# Patient Record
Sex: Female | Born: 1940
Health system: Southern US, Community
[De-identification: ages and names within clinical notes are randomized; demographics above are authoritative.]

## PROBLEM LIST (undated history)

## (undated) DIAGNOSIS — G4700 Insomnia, unspecified: Secondary | ICD-10-CM

## (undated) DIAGNOSIS — N183 Chronic kidney disease, stage 3 unspecified: Secondary | ICD-10-CM

## (undated) DIAGNOSIS — M1990 Unspecified osteoarthritis, unspecified site: Secondary | ICD-10-CM

## (undated) DIAGNOSIS — I110 Hypertensive heart disease with heart failure: Secondary | ICD-10-CM

## (undated) DIAGNOSIS — I6529 Occlusion and stenosis of unspecified carotid artery: Secondary | ICD-10-CM

## (undated) DIAGNOSIS — I071 Rheumatic tricuspid insufficiency: Secondary | ICD-10-CM

## (undated) DIAGNOSIS — Z0389 Encounter for observation for other suspected diseases and conditions ruled out: Secondary | ICD-10-CM

## (undated) DIAGNOSIS — M62830 Muscle spasm of back: Secondary | ICD-10-CM

## (undated) DIAGNOSIS — H9190 Unspecified hearing loss, unspecified ear: Secondary | ICD-10-CM

## (undated) DIAGNOSIS — R350 Frequency of micturition: Secondary | ICD-10-CM

## (undated) DIAGNOSIS — R519 Headache, unspecified: Secondary | ICD-10-CM

## (undated) DIAGNOSIS — H18519 Endothelial corneal dystrophy, unspecified eye: Secondary | ICD-10-CM

## (undated) DIAGNOSIS — R609 Edema, unspecified: Secondary | ICD-10-CM

## (undated) DIAGNOSIS — I5032 Chronic diastolic (congestive) heart failure: Principal | ICD-10-CM

## (undated) DIAGNOSIS — E119 Type 2 diabetes mellitus without complications: Secondary | ICD-10-CM

## (undated) DIAGNOSIS — E7800 Pure hypercholesterolemia, unspecified: Secondary | ICD-10-CM

## (undated) DIAGNOSIS — Z9114 Patient's other noncompliance with medication regimen: Secondary | ICD-10-CM

## (undated) DIAGNOSIS — R001 Bradycardia, unspecified: Secondary | ICD-10-CM

## (undated) DIAGNOSIS — K219 Gastro-esophageal reflux disease without esophagitis: Secondary | ICD-10-CM

## (undated) DIAGNOSIS — G4733 Obstructive sleep apnea (adult) (pediatric): Secondary | ICD-10-CM

## (undated) DIAGNOSIS — F419 Anxiety disorder, unspecified: Secondary | ICD-10-CM

## (undated) DIAGNOSIS — H1851 Endothelial corneal dystrophy: Secondary | ICD-10-CM

## (undated) DIAGNOSIS — Z9989 Dependence on other enabling machines and devices: Secondary | ICD-10-CM

## (undated) DIAGNOSIS — R51 Headache: Secondary | ICD-10-CM

## (undated) DIAGNOSIS — Z91148 Patient's other noncompliance with medication regimen for other reason: Secondary | ICD-10-CM

## (undated) DIAGNOSIS — I340 Nonrheumatic mitral (valve) insufficiency: Secondary | ICD-10-CM

## (undated) DIAGNOSIS — Z9111 Patient's noncompliance with dietary regimen: Secondary | ICD-10-CM

## (undated) DIAGNOSIS — F329 Major depressive disorder, single episode, unspecified: Secondary | ICD-10-CM

## (undated) DIAGNOSIS — F32A Depression, unspecified: Secondary | ICD-10-CM

## (undated) DIAGNOSIS — R600 Localized edema: Secondary | ICD-10-CM

## (undated) HISTORY — PX: ABDOMINAL HYSTERECTOMY: SHX81

## (undated) HISTORY — DX: Nonrheumatic mitral (valve) insufficiency: I34.0

## (undated) HISTORY — DX: Occlusion and stenosis of unspecified carotid artery: I65.29

## (undated) HISTORY — PX: EYE SURGERY: SHX253

## (undated) HISTORY — PX: COLONOSCOPY: SHX174

## (undated) HISTORY — PX: ROTATOR CUFF REPAIR: SHX139

## (undated) HISTORY — DX: Patient's other noncompliance with medication regimen for other reason: Z91.148

## (undated) HISTORY — PX: APPENDECTOMY: SHX54

## (undated) HISTORY — DX: Chronic diastolic (congestive) heart failure: I50.32

## (undated) HISTORY — PX: CHOLECYSTECTOMY: SHX55

## (undated) HISTORY — DX: Patient's noncompliance with dietary regimen: Z91.11

## (undated) HISTORY — DX: Edema, unspecified: R60.9

## (undated) HISTORY — DX: Rheumatic tricuspid insufficiency: I07.1

## (undated) HISTORY — DX: Bradycardia, unspecified: R00.1

## (undated) HISTORY — DX: Localized edema: R60.0

## (undated) HISTORY — DX: Chronic kidney disease, stage 3 unspecified: N18.30

## (undated) HISTORY — PX: CARDIAC CATHETERIZATION: SHX172

## (undated) HISTORY — DX: Encounter for observation for other suspected diseases and conditions ruled out: Z03.89

## (undated) HISTORY — DX: Hypertensive heart disease with heart failure: I11.0

## (undated) HISTORY — DX: Patient's other noncompliance with medication regimen: Z91.14

---

## 1997-12-23 ENCOUNTER — Ambulatory Visit: Admission: RE | Admit: 1997-12-23 | Discharge: 1997-12-23 | Payer: Self-pay | Admitting: Family Medicine

## 1998-03-31 ENCOUNTER — Ambulatory Visit: Admission: RE | Admit: 1998-03-31 | Discharge: 1998-03-31 | Payer: Self-pay | Admitting: Internal Medicine

## 1998-12-05 ENCOUNTER — Ambulatory Visit (HOSPITAL_BASED_OUTPATIENT_CLINIC_OR_DEPARTMENT_OTHER): Admission: RE | Admit: 1998-12-05 | Discharge: 1998-12-05 | Payer: Self-pay | Admitting: Orthopedic Surgery

## 1999-09-12 ENCOUNTER — Encounter: Payer: Self-pay | Admitting: Family Medicine

## 1999-09-12 ENCOUNTER — Encounter: Admission: RE | Admit: 1999-09-12 | Discharge: 1999-09-12 | Payer: Self-pay | Admitting: Family Medicine

## 1999-09-18 ENCOUNTER — Encounter: Payer: Self-pay | Admitting: Emergency Medicine

## 1999-09-18 ENCOUNTER — Encounter: Admission: RE | Admit: 1999-09-18 | Discharge: 1999-09-18 | Payer: Self-pay | Admitting: *Deleted

## 2000-09-29 ENCOUNTER — Encounter: Payer: Self-pay | Admitting: Family Medicine

## 2000-09-29 ENCOUNTER — Encounter: Admission: RE | Admit: 2000-09-29 | Discharge: 2000-09-29 | Payer: Self-pay | Admitting: Family Medicine

## 2001-10-13 ENCOUNTER — Encounter: Payer: Self-pay | Admitting: *Deleted

## 2001-10-13 ENCOUNTER — Encounter: Admission: RE | Admit: 2001-10-13 | Discharge: 2001-10-13 | Payer: Self-pay | Admitting: *Deleted

## 2001-12-02 ENCOUNTER — Inpatient Hospital Stay (HOSPITAL_COMMUNITY): Admission: EM | Admit: 2001-12-02 | Discharge: 2001-12-03 | Payer: Self-pay | Admitting: Emergency Medicine

## 2002-10-17 ENCOUNTER — Encounter: Payer: Self-pay | Admitting: Family Medicine

## 2002-10-17 ENCOUNTER — Encounter: Admission: RE | Admit: 2002-10-17 | Discharge: 2002-10-17 | Payer: Self-pay | Admitting: Family Medicine

## 2003-10-18 ENCOUNTER — Encounter: Admission: RE | Admit: 2003-10-18 | Discharge: 2003-10-18 | Payer: Self-pay | Admitting: Family Medicine

## 2003-11-07 ENCOUNTER — Other Ambulatory Visit: Admission: RE | Admit: 2003-11-07 | Discharge: 2003-11-07 | Payer: Self-pay | Admitting: Family Medicine

## 2004-11-06 ENCOUNTER — Encounter: Admission: RE | Admit: 2004-11-06 | Discharge: 2004-11-06 | Payer: Self-pay | Admitting: Family Medicine

## 2005-11-19 ENCOUNTER — Encounter: Admission: RE | Admit: 2005-11-19 | Discharge: 2005-11-19 | Payer: Self-pay | Admitting: Family Medicine

## 2005-12-09 ENCOUNTER — Other Ambulatory Visit: Admission: RE | Admit: 2005-12-09 | Discharge: 2005-12-09 | Payer: Self-pay | Admitting: Obstetrics and Gynecology

## 2006-05-28 ENCOUNTER — Other Ambulatory Visit: Admission: RE | Admit: 2006-05-28 | Discharge: 2006-05-28 | Payer: Self-pay | Admitting: Obstetrics and Gynecology

## 2006-06-01 ENCOUNTER — Encounter: Admission: RE | Admit: 2006-06-01 | Discharge: 2006-06-01 | Payer: Self-pay | Admitting: Obstetrics and Gynecology

## 2006-11-17 ENCOUNTER — Ambulatory Visit (HOSPITAL_BASED_OUTPATIENT_CLINIC_OR_DEPARTMENT_OTHER): Admission: RE | Admit: 2006-11-17 | Discharge: 2006-11-17 | Payer: Self-pay | Admitting: Orthopedic Surgery

## 2006-11-23 ENCOUNTER — Encounter: Admission: RE | Admit: 2006-11-23 | Discharge: 2006-11-23 | Payer: Self-pay | Admitting: Family Medicine

## 2007-08-13 ENCOUNTER — Ambulatory Visit (HOSPITAL_BASED_OUTPATIENT_CLINIC_OR_DEPARTMENT_OTHER): Admission: RE | Admit: 2007-08-13 | Discharge: 2007-08-13 | Payer: Self-pay | Admitting: Orthopedic Surgery

## 2007-11-03 ENCOUNTER — Encounter: Admission: RE | Admit: 2007-11-03 | Discharge: 2007-11-03 | Payer: Self-pay | Admitting: Family Medicine

## 2007-11-25 ENCOUNTER — Encounter: Admission: RE | Admit: 2007-11-25 | Discharge: 2007-11-25 | Payer: Self-pay | Admitting: Family Medicine

## 2008-05-12 DIAGNOSIS — IMO0001 Reserved for inherently not codable concepts without codable children: Secondary | ICD-10-CM

## 2008-05-12 HISTORY — DX: Reserved for inherently not codable concepts without codable children: IMO0001

## 2008-08-14 ENCOUNTER — Inpatient Hospital Stay (HOSPITAL_BASED_OUTPATIENT_CLINIC_OR_DEPARTMENT_OTHER): Admission: RE | Admit: 2008-08-14 | Discharge: 2008-08-14 | Payer: Self-pay | Admitting: Interventional Cardiology

## 2008-12-08 ENCOUNTER — Encounter: Admission: RE | Admit: 2008-12-08 | Discharge: 2008-12-08 | Payer: Self-pay | Admitting: Family Medicine

## 2010-01-07 ENCOUNTER — Encounter: Admission: RE | Admit: 2010-01-07 | Discharge: 2010-01-07 | Payer: Self-pay | Admitting: Family Medicine

## 2010-07-26 ENCOUNTER — Emergency Department (HOSPITAL_COMMUNITY): Payer: Medicare Other

## 2010-07-26 ENCOUNTER — Emergency Department (HOSPITAL_COMMUNITY)
Admission: EM | Admit: 2010-07-26 | Discharge: 2010-07-26 | Disposition: A | Discharge status: Home / Self Care | Payer: Medicare Other | Attending: Emergency Medicine | Admitting: Emergency Medicine

## 2010-07-26 DIAGNOSIS — R0602 Shortness of breath: Secondary | ICD-10-CM | POA: Insufficient documentation

## 2010-07-26 DIAGNOSIS — F411 Generalized anxiety disorder: Secondary | ICD-10-CM | POA: Insufficient documentation

## 2010-07-26 DIAGNOSIS — I10 Essential (primary) hypertension: Secondary | ICD-10-CM | POA: Insufficient documentation

## 2010-07-26 DIAGNOSIS — E7800 Pure hypercholesterolemia, unspecified: Secondary | ICD-10-CM | POA: Insufficient documentation

## 2010-07-26 DIAGNOSIS — R0682 Tachypnea, not elsewhere classified: Secondary | ICD-10-CM | POA: Insufficient documentation

## 2010-07-26 DIAGNOSIS — R0789 Other chest pain: Secondary | ICD-10-CM | POA: Insufficient documentation

## 2010-07-26 DIAGNOSIS — R259 Unspecified abnormal involuntary movements: Secondary | ICD-10-CM | POA: Insufficient documentation

## 2010-07-26 DIAGNOSIS — Z79899 Other long term (current) drug therapy: Secondary | ICD-10-CM | POA: Insufficient documentation

## 2010-07-26 LAB — BASIC METABOLIC PANEL
BUN: 25 mg/dL — ABNORMAL HIGH (ref 6–23)
GFR calc non Af Amer: 54 mL/min — ABNORMAL LOW (ref >60)

## 2010-07-26 LAB — DIFFERENTIAL
Basophils Absolute: 0 10*3/uL (ref 0.0–0.1)
Basophils Relative: 0 % (ref 0–1)
Eosinophils Absolute: 0.1 10*3/uL (ref 0.0–0.7)
Eosinophils Relative: 1 % (ref 0–5)
Monocytes Absolute: 0.4 10*3/uL (ref 0.1–1.0)
Monocytes Relative: 6 % (ref 3–12)
Neutro Abs: 3 10*3/uL (ref 1.7–7.7)

## 2010-07-26 LAB — CBC
HCT: 38 % (ref 36.0–46.0)
Hemoglobin: 12.7 g/dL (ref 12.0–15.0)
MCHC: 33.4 g/dL (ref 30.0–36.0)
MCV: 88.6 fL (ref 78.0–100.0)
RBC: 4.29 MIL/uL (ref 3.87–5.11)
RDW: 12.8 % (ref 11.5–15.5)
WBC: 6.4 10*3/uL (ref 4.0–10.5)

## 2010-07-26 LAB — POCT CARDIAC MARKERS
CKMB, poc: <1 ng/mL — ABNORMAL LOW (ref 1.0–8.0)
Troponin i, poc: <0.05 ng/mL (ref 0.00–0.09)

## 2010-09-24 NOTE — Cardiovascular Report (Signed)
NAMEABBE, BULA NO.:  [id number]   MEDICAL RECORD NO.:  [id number]          PATIENT TYPE:  OIB   LOCATION:  1961                         FACILITY:  [facility]   PHYSICIAN:  Lyn Records, M.D.   DATE OF BIRTH:  [birth year]   DATE OF PROCEDURE:  08/14/2008  DATE OF DISCHARGE:  08/14/2008                            CARDIAC CATHETERIZATION   INDICATION FOR THIS STUDY:  Chest tightness and pressure.   PROCEDURE PERFORMED:  1. Left heart cath.  2. Selective coronary angio.  3. Left ventriculography.   DESCRIPTION:  After informed consent, a 4-French sheath was placed in  the right femoral artery using the modified Seldinger technique.  A 4-  [place] A2 multipurpose catheter was used for hemodynamic recordings,  left ventriculography by hand injection, and attempted right coronary  angiography.  A 4-French #4 left Judkins catheters were also used for  left and right coronary angiography respectively.  The patient tolerated  the procedure without complications.   RESULTS:  1. Hemodynamic data:      a.     Aortic pressure 202/86.      b.     Left ventricular pressure 210/24.  2. Left ventriculography:  The left ventricle is normal in size.  LV      systolic function is normal.  Ejection fraction is 80%.  3. Coronary angiography.      a.     Left main coronary:  Left main coronary artery is patent.       It is relatively short.  No significant obstruction is noted.      b.     Left anterior descending coronary:  LAD is large and is       transapical.  The LAD gives origin to at least one relatively       large diagonal branch.  No significant obstruction is noted.      c.     Circumflex artery:  The circumflex coronary artery contains       luminal irregularities involving the first and second obtuse       marginal via the origin from the circumflex.  No significant       circumflex obstructions are noted.  The circumflex was dominant.       It gives origin to  distal tiny PDA branch as a continuation of the       circumflex into the AV groove and also a small posterolateral       branch.      d.     Right coronary:  The right coronary is nondominant and is       free of any significant obstruction.   CONCLUSION:  1. Significant hypertension with diastolic dysfunction and left      ventricular end-diastolic pressure greater than 20.  2. Normal coronary arteries.  There is a dominant circumflex and      nondominant right coronary.  No obstructive disease is noted.  3. Chest pressure is now on the basis of abnormal epicardial      coronaries.  It could be related to  microvascular disease and/or      diastolic heart failure.   PLAN:  1. Aggressive blood pressure control.  2. Risk factor modification.  3. Consider a myocardial perfusion study to look for abnormal      perfusion which would documented the diagnosis of microvascular      disease.  4. No mechanical intervention is needed in the absence of epicardial      coronary disease.      Lyn Records, M.D.  Electronically Signed     HWS/MEDQ  D:  08/14/2008  T:  08/15/2008  Job:  [id number]   cc:   [name], M.D.

## 2010-09-24 NOTE — Op Note (Signed)
NAME:  Kimberly [name], Kimberly [name]                ACCOUNT NO.:  [id number]   MEDICAL RECORD NO.:  [id number]          PATIENT TYPE:  AMB   LOCATION:  DSC                          FACILITY:  [facility]   PHYSICIAN:  Katy Fitch. Sypher, M.D. DATE OF BIRTH:  [birth year]   DATE OF PROCEDURE:  11/17/2006  DATE OF DISCHARGE:                               OPERATIVE REPORT   PREOPERATIVE DIAGNOSIS:  Severe right carpal tunnel syndrome.   POSTOPERATIVE DIAGNOSIS:  Severe right carpal tunnel syndrome with  identification of remarkable fatty infiltration of ulnar bursa.   OPERATIONS:  Release of right transverse carpal ligament.   OPERATIONS:  [name], M.D.   ASSISTANT:  [name] PA-C.   ANESTHESIA:  Is IV regional with general sedation, supervising  anesthesiologist is Dr. Sampson Goon.   INDICATION:  The patient is a 70 year old right-hand dominant woman  referred through the courtesy of [name], M.D. for evaluation  and management of bilateral hand numbness and discomfort.  Clinical  examination revealed an obese woman who is 5 feet tall and weighs 176  pounds.  Clinical examination revealed thenar atrophy and loss of  sensibility in the median distribution right greater than left.  Electrodiagnostic studies confirmed profound right carpal tunnel  syndrome and moderately severe left carpal tunnel syndrome.   Due to failed response to nonoperative measures, she is brought to the  operating room at this time for release of her right transverse carpal  ligament.   Preoperatively she was evaluated and noted to have hypertension and  history of other medical predicaments leading to a choice for IV  regional anesthesia.   Her systolic blood pressure at the initiation of anesthesia was in  excess of 200 mmHg.  Therefore a pneumatic tourniquet was applied to the  proximal brachium during placement of the IV regional block at 320 mmHg.   An IV regional block was placed in standard technique  followed by  routine Betadine scrub of the right upper extremity.   After 13 minutes excellent anesthesia of the hand was achieved.   Procedure commenced with a short incision in line of the ring finger  palm.  The subcutaneous tissues were carefully divided revealing palmar  fascia.  The fiber the palmar fascia split longitudinally revealing a  very substantial deposit of adipose tissue in the mid palmar space.  This fluid extruded from the wound.   This was retracted with Senn retractors and ultimately a mid size  Weitlaner was utilized to visualize the common sensory branch of the  median nerve.  The volar forearm fascia was released subcutaneously into  the distal forearm followed by identification of the distal margin of  transverse carpal ligament.  Once median nerves carefully protected the  transverse carpal ligament was released subcutaneously extending into  the distal forearm.   This revealed the contents of the ulnar bursa.  There was remarkable  degree of fatty infiltration of the ulnar bursa and a small lipoma  appeared to be within the wall of the median nerve.  This was left  intact due to concern that injury to the  fascicles of the nerve could be  a predicament with debridement of the lipoma.  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   Bleeding points along the margin of loose ligament were cauterized  bipolar current followed by repair of the skin with intradermal 3-0  Prolene suture.   A compressive dressing was applied with a volar plaster splint  maintaining the wrist in 5 degrees dorsiflexion.   No apparent complications.      Katy Fitch Sypher, M.D.  Electronically Signed     RVS/MEDQ  D:  11/17/2006  T:  11/17/2006  Job:  [id number]   cc:   [name], M.D.

## 2010-09-24 NOTE — Op Note (Signed)
NAME:  Kimberly [name], Kimberly [name]                ACCOUNT NO.:  [id number]   MEDICAL RECORD NO.:  [id number]          PATIENT TYPE:  AMB   LOCATION:  DSC                          FACILITY:  [facility]   PHYSICIAN:  Katy Fitch. Sypher, M.D. DATE OF BIRTH:  May 14, 1940   DATE OF PROCEDURE:  08/13/2007  DATE OF DISCHARGE:                               OPERATIVE REPORT   PREOPERATIVE DIAGNOSIS:  Entrapment neuropathy median nerve left carpal  tunnel with significantly abnormal electrodiagnostic studies.   POSTOPERATIVE DIAGNOSIS:  Entrapment neuropathy median nerve left carpal  tunnel with significantly abnormal electrodiagnostic studies.   OPERATION:  Release of left transverse carpal ligament.   OPERATIONS:  Katy Fitch. Sypher, M.D.   ASSISTANT:  Jonni Sanger, P.A.   ANESTHESIA:  General by LMA.   SUPERVISING ANESTHESIOLOGIST:  Quita Skye. Krista Blue, M.D.   INDICATIONS:  [name] is a 70 year old woman referred through the  courtesy of Dr. Illa Level of [facility] for evaluation  and management of hand pain and hand numbness.  Electrodiagnostic  studies performed in the summer of 2008 confirmed severe bilateral  carpal tunnel syndrome.   She is status post release of the right trans-carpal ligament with a  good long term outcome.  She now returns for identical surgery on the  left.   Preoperatively she was reminded of the potential risks and benefits of  surgery.  She has multiple drug intolerances.  She is able to safely  take Darvocet as an analgesic.   We advised her to proceed with release of her left transverse carpal  ligament at this time.  Questions were invited and answered in detail.   PROCEDURE IN DETAIL:  [name] is brought to the operating room  and placed in supine position upon the operating table.   Following the induction of general anesthesia by LMA technique the left  arm was prepped with Betadine soap solution, sterilely draped.   A pneumatic  tourniquet was applied proximal left brachium.  Following  exsanguination of left arm with Esmarch bandage the arterial tourniquet  was inflated to 220 mmHg.  The procedure commenced with a short incision  paralleling the radial border of the ring finger.  Subcutaneous tissues  were carefully divided taking care to identify the palmar fascia.  This  was released subcutaneously revealing the common sensory branch of the  median nerve and the transverse carpal ligament.  A [place] 4 elevator  was used to separate the median nerve from the transverse carpal  ligament.  A pair of tenotomy scissors was used to release the distal  margin of the transverse carpal ligament followed by use of iridectomy  scissors to release the transverse carpal ligament subcutaneously into  the distal forearm as well as the volar forearm fascia.  This widely  opened the carpal canal.  Ms. Frysinger's median nerve was quite edematous  and hyperemic.  The wound was inspected for bleeding points followed by  repair of the skin with intradermal 3-0 Prolene.   A compressive dressing was applied with a volar plaster splint  maintaining the wrist  in 5 degrees of dorsiflexion.  For aftercare Ms.  Chiquito is provided a prescription for Darvocet-N 100 one p.o. q.4-6 h.  p.r.n. pain 20 tablets without refill.  We will see her back for  followup in our office for reexamination in 1 week.      Katy Fitch Sypher, M.D.  Electronically Signed     RVS/MEDQ  D:  08/13/2007  T:  08/13/2007  Job:  [id number]   cc:   [name], M.D.

## 2011-01-02 ENCOUNTER — Other Ambulatory Visit: Payer: Self-pay | Admitting: Family Medicine

## 2011-01-02 DIAGNOSIS — Z1231 Encounter for screening mammogram for malignant neoplasm of breast: Secondary | ICD-10-CM

## 2011-01-21 ENCOUNTER — Ambulatory Visit
Admission: RE | Admit: 2011-01-21 | Discharge: 2011-01-21 | Disposition: A | Discharge status: Home / Self Care | Payer: Medicare Other | Source: Ambulatory Visit | Attending: Family Medicine | Admitting: Family Medicine

## 2011-01-21 DIAGNOSIS — Z1231 Encounter for screening mammogram for malignant neoplasm of breast: Secondary | ICD-10-CM

## 2011-02-04 LAB — BASIC METABOLIC PANEL
BUN: 21
Calcium: 10.1
Creatinine, Ser: 0.82
Potassium: 3.1 — ABNORMAL LOW

## 2011-02-25 LAB — BASIC METABOLIC PANEL
BUN: 16
CO2: 29
Calcium: 10.4
Chloride: 101
Creatinine, Ser: 0.69
GFR calc Af Amer: >60
GFR calc non Af Amer: >60
Glucose, Bld: 103 — ABNORMAL HIGH
Sodium: 138

## 2011-10-14 ENCOUNTER — Other Ambulatory Visit: Payer: Self-pay | Admitting: Orthopedic Surgery

## 2011-10-14 DIAGNOSIS — M533 Sacrococcygeal disorders, not elsewhere classified: Secondary | ICD-10-CM

## 2011-10-17 ENCOUNTER — Encounter (HOSPITAL_COMMUNITY): Payer: Self-pay | Admitting: Emergency Medicine

## 2011-10-17 ENCOUNTER — Emergency Department (HOSPITAL_COMMUNITY)
Admission: EM | Admit: 2011-10-17 | Discharge: 2011-10-17 | Disposition: A | Discharge status: Home / Self Care | Payer: Medicare Other | Attending: Emergency Medicine | Admitting: Emergency Medicine

## 2011-10-17 ENCOUNTER — Emergency Department (HOSPITAL_COMMUNITY): Payer: Medicare Other

## 2011-10-17 DIAGNOSIS — R0609 Other forms of dyspnea: Secondary | ICD-10-CM | POA: Insufficient documentation

## 2011-10-17 DIAGNOSIS — R0600 Dyspnea, unspecified: Secondary | ICD-10-CM

## 2011-10-17 DIAGNOSIS — I10 Essential (primary) hypertension: Secondary | ICD-10-CM | POA: Insufficient documentation

## 2011-10-17 DIAGNOSIS — K219 Gastro-esophageal reflux disease without esophagitis: Secondary | ICD-10-CM | POA: Insufficient documentation

## 2011-10-17 DIAGNOSIS — R0602 Shortness of breath: Secondary | ICD-10-CM | POA: Insufficient documentation

## 2011-10-17 DIAGNOSIS — R0989 Other specified symptoms and signs involving the circulatory and respiratory systems: Secondary | ICD-10-CM | POA: Insufficient documentation

## 2011-10-17 HISTORY — DX: Gastro-esophageal reflux disease without esophagitis: K21.9

## 2011-10-17 LAB — BASIC METABOLIC PANEL
CO2: 27 mEq/L (ref 19–32)
Calcium: 11.1 mg/dL — ABNORMAL HIGH (ref 8.4–10.5)
Creatinine, Ser: 0.86 mg/dL (ref 0.50–1.10)
GFR calc non Af Amer: 67 mL/min — ABNORMAL LOW (ref >90)
Glucose, Bld: 139 mg/dL — ABNORMAL HIGH (ref 70–99)
Sodium: 140 mEq/L (ref 135–145)

## 2011-10-17 LAB — CBC
MCH: 29.4 pg (ref 26.0–34.0)
MCHC: 33 g/dL (ref 30.0–36.0)
MCV: 89.1 fL (ref 78.0–100.0)
Platelets: 221 10*3/uL (ref 150–400)
RBC: 4.05 MIL/uL (ref 3.87–5.11)

## 2011-10-17 LAB — TROPONIN I: Troponin I: <0.3 ng/mL (ref <0.30)

## 2011-10-17 LAB — D-DIMER, QUANTITATIVE: D-Dimer, Quant: 0.26 ug/mL-FEU (ref 0.00–0.48)

## 2011-10-17 MED ORDER — ONDANSETRON 4 MG PO TBDP
8.0000 mg | ORAL_TABLET | Freq: Once | ORAL | Status: AC
  Administered 2011-10-17: 8 mg via ORAL
  Filled 2011-10-17: qty 2

## 2011-10-17 NOTE — ED Provider Notes (Signed)
History     CSN: [id number]  Arrival date & time 10/17/11  1616   None     Chief Complaint  Patient presents with  . Shortness of Breath    (Consider location/radiation/quality/duration/timing/severity/associated sxs/prior treatment) HPI  Patient is a 71 year old female past medical history of hypertension and acid reflux disease, she also has a recent history of coccygeal pain for which she started taking Percocets approximately 72 hours ago. Her chief complaint today is shortness of breath. She had the acute onset of a sense of being unable to take a full breath. She feels that the air only enters the top of her lungs. She will went outside to take a while to feel better, and she felt like she was short of breath with exertion. She had no chest pain arm pain or jaw pain. She did have tingling in her left distal fingers. She denies any recent illnesses cough or fevers. She denies a history of heart disease lung disease or kidney disease. She denies any nausea vomiting or diarrhea. She denies any diaphoresis. She calls her illness him now mild because it's gotten better since he first noticed the complaint he called EMS. In and on arrival the patient's blood pressure 178/58, temperature 98.81F, saturation is 99%, pulse 89, respirations 24. Patient denies any history of clotting disorder or pulmonary embolus or DVT. Past Medical History  Diagnosis Date  . Hypertension   . Acid reflux     History reviewed. No pertinent past surgical history.  History reviewed. No pertinent family history.  History  Substance Use Topics  . Smoking status: Not on file  . Smokeless tobacco: Not on file  . Alcohol Use:     OB History    Grav Para Term Preterm Abortions TAB SAB Ect Mult Living                  Review of Systems Constitutional: Negative for fever and chills.  HENT: Negative for ear pain, sore throat and trouble swallowing.   Eyes: Negative for pain and visual disturbance.    Respiratory: Negative for cough and POS shortness of breath.   Cardiovascular: Negative for chest pain and leg swelling.  Gastrointestinal: Negative for nausea, vomiting, abdominal pain and diarrhea.  Genitourinary: Negative for dysuria, urgency and frequency.  Musculoskeletal: Negative for back pain and joint swelling.  Skin: Negative for rash and wound.  Neurological: Negative for dizziness, syncope, speech difficulty, weakness and numbness.    Allergies  Other  Home Medications   Current Outpatient Rx  Name Route Sig Dispense Refill  . ACETAMINOPHEN 500 MG PO TABS Oral Take 500 mg by mouth daily as needed. For pain    . AMLODIPINE BESYLATE 10 MG PO TABS Oral Take 20 mg by mouth daily.    . ASPIRIN 81 MG PO CHEW Oral Chew 81 mg by mouth daily.    Marland Kitchen VITAMIN D 1000 UNITS PO TABS Oral Take 1,000 Units by mouth daily.    . FENOFIBRATE MICRONIZED 200 MG PO CAPS Oral Take 200 mg by mouth daily before breakfast.    . OMEGA-3 FATTY ACIDS 1000 MG PO CAPS Oral Take 2 g by mouth daily.    Marland Kitchen LISINOPRIL-HYDROCHLOROTHIAZIDE 20-12.5 MG PO TABS Oral Take 1 tablet by mouth daily.    . MOMETASONE FUROATE 50 MCG/ACT NA SUSP Nasal Place 2 sprays into the nose daily.    Marland Kitchen NITROGLYCERIN 0.4 MG SL SUBL Sublingual Place 0.4 mg under the tongue every 5 (five) minutes  as needed.    . OXYCODONE-ACETAMINOPHEN 5-325 MG PO TABS Oral Take 1 tablet by mouth every 6 (six) hours as needed. For pain    . PAROXETINE HCL 20 MG PO TABS Oral Take 60 mg by mouth every morning.    Marland Kitchen PRAVASTATIN SODIUM 40 MG PO TABS Oral Take 40 mg by mouth daily.    Marland Kitchen ZOLPIDEM TARTRATE 10 MG PO TABS Oral Take 5 mg by mouth at bedtime as needed. For sleep      BP 128/47  Pulse 64  Temp(Src) 98.2 F (36.8 C) (Oral)  Resp 18  SpO2 96%  Physical Exam Consitutional: Pt in no acute distress.   Head: Normocephalic and atraumatic.  Eyes: Extraocular motion intact, no scleral icterus Neck: Supple without meningismus, mass, or overt  JVD Respiratory: Effort normal and breath sounds normal. No respiratory distress. CV: Heart regular rate and rhythm, no obvious murmurs.  Pulses +2 and symmetric Abdomen: Soft, non-tender, non-distended MSK: Extremities are atraumatic without deformity, ROM intact Skin: Warm, dry, intact Neuro: Alert and oriented, no motor deficit noted.  CNs intact.  PERRL.  Reflexes normal BLE, no clonus.  Hips, knee and ankle, arm and wrist strength maintained.  FTN and RAM normal.  CNs 2-12 normal.  Psychiatric: Mood and affect are normal    Date: 10/17/2011  Rate: 81  Rhythm: normal sinus rhythm  QRS Axis: normal  Intervals: normal  ST/T Wave abnormalities: nonspecific T wave changes  Conduction Disutrbances:none  Narrative Interpretation:   Old EKG Reviewed: none available     ED Course  Procedures (including critical care time)  Labs Reviewed  CBC - Abnormal; Notable for the following:    Hemoglobin 11.9 (*)    All other components within normal limits  BASIC METABOLIC PANEL - Abnormal; Notable for the following:    Glucose, Bld 139 (*)    BUN 25 (*)    Calcium 11.1 (*)    GFR calc non Af Amer 67 (*)    GFR calc Af Amer 78 (*)    All other components within normal limits  TROPONIN I  D-DIMER, QUANTITATIVE  PRO B NATRIURETIC PEPTIDE  POCT I-STAT TROPONIN I  TROPONIN I   Dg Chest 1 View  10/17/2011  *RADIOLOGY REPORT*  Clinical Data: Shortness of breath  CHEST - 1 VIEW  Comparison: 07/26/2010  Findings: Cardiomegaly with pulmonary vascular congestion.  No frank interstitial edema.  No pleural effusion or pneumothorax.  IMPRESSION: Cardiomegaly with pulmonary vascular congestion.  No frank interstitial edema.  Original Report Authenticated By: Charline Bills, M.D.     1. Dyspnea       MDM  Sudden onset dyspnea.  Brief. Resolved before arrival. Given the patient's story, must ruleout acute coronary syndrome and pulmonary embolus given her age and risk factors. We will screen  for pulmonary embolus with a d-dimer. We will complete a troponin and EKG with concern for acute coronary syndrome. Of note however, her story is not convincing for either case. More likely her new prescription for Percocet is to blame and making her feel like she cannot breathe properly.  Of note she is hemodynamically stable here. We will also complete a chest x-ray of course and basic blood work and we'll reassess the patient at that time. Given her poor story for ACS, negative workup will likely lead to discharge home for outpatient workup.  Cxr with very mild increased vascular markings from previous.  Clear lungs to auscultation, no leg swelling.  Pt no  longer dyspneic.  Neg trop X 2.  Pt given precautions to return.  PT DC home stable.  Discussed with pt the clinical impression, treatment in the ED, and follow up plan to see PCP to ensure this is not early CHF.  We alslo discussed the indications for returning to the ED, which include shortness or breath, confusion, fever, new weakness or numbness, chest pain, or any other concerning symptom.  The pt understood the treatment and plan, is stable, and is able to leave the ED.             Larrie Kass, MD 10/17/11 2253

## 2011-10-17 NOTE — ED Notes (Signed)
Per EMS: pt from home c/o SOB while rolling trash cans up hill to house; pt sts she felt like she was "trying to breath through a straw"; pt anxious upon EMS arrival; pt 99% on RA speaking complete sentences at present

## 2011-10-17 NOTE — ED Notes (Signed)
PT ambulated with a steady gait; VSS; A&Ox3; no signs of distress; respirations even and unlabored' skin warm and dry. No questions at this time.  

## 2011-10-17 NOTE — Discharge Instructions (Signed)
Follow up with your providers as dicussed in the ED today and as written above.  See your doctor immediately--or return to the ED--with any new or troubling symptoms including fevers, weakness, new chest pain, shortness or breath, numbness, or any other concerning symptom.    Dyspnea Shortness of breath (dyspnea) is the feeling of uneasy breathing. Dyspnea should be evaluated promptly. DIAGNOSIS  Many tests may be done to find why you are having shortness of breath. Tests may include:  A chest X-ray.   A lung function test.   Blood tests.   Recordings of the electrical activity of the heart (electrocardiogram).   Exercise testing.   Sound wave images of the heart (a cardiac echocardiogram).   A scan.  A cause for your shortness of breath may not be identified initially. In this case, it is important to have a follow-up exam with your caregiver. HOME CARE INSTRUCTIONS   Do not smoke. Smoking is a common cause of shortness of breath. Ask for help to stop smoking.   Avoid being around chemicals that may bother your breathing, such as paint fumes or dust.   Rest as needed. Slowly begin your usual activities.   If medications were prescribed, take them as directed for the full length of time directed. This includes oxygen and any inhaled medications, if prescribed.   It is very important that you follow up with your caregiver or other physician as directed. Waiting to do so or failure to follow up could result in worsening of your condition, possible disability, or death.   Be sure you understand what to do or who to call if your shortness of breath worsens.  SEEK MEDICAL CARE IF:   Your condition does not improve in the time expected.   You have a hard time doing your normal activities even with rest.   You have any side effects from or problems with medications prescribed.  SEEK IMMEDIATE MEDICAL CARE IF:   You feel your shortness of breath is getting worse.   You feel  lightheaded, faint or develop a cough not controlled with medications.   You start coughing up blood.   You get pain with breathing.   You get chest pain or pain in your arms, shoulders or belly (abdomen).   You have a fever.   You are unable to walk up stairs or exercise the way you normally can.  MAKE SURE YOU:   Understand these instructions.   Will watch your condition.   Will get help right away if you are not doing well or get worse.  Document Released: 06/05/2004 Document Revised: 01/08/2011 Document Reviewed: 09/13/2009 [facility] Patient Information 2012 [place], [place].

## 2011-10-19 ENCOUNTER — Ambulatory Visit
Admission: RE | Admit: 2011-10-19 | Discharge: 2011-10-19 | Disposition: A | Discharge status: Home / Self Care | Payer: BC Managed Care – PPO | Source: Ambulatory Visit | Attending: Orthopedic Surgery | Admitting: Orthopedic Surgery

## 2011-10-19 DIAGNOSIS — M533 Sacrococcygeal disorders, not elsewhere classified: Secondary | ICD-10-CM

## 2011-10-20 NOTE — ED Provider Notes (Signed)
I have personally seen and examined the patient.  I have discussed plan of care with resident.   I have reviewed the appropriate documentation on PMH/FH/Soc. History.  I have reviewed the documentation of the resident and agree.  I have reviewed and agree with the ECG interpretation(s) documented by the resident.  Pt well appearing, no distress, seen walking around ED in no distress   Joya Gaskins, MD 10/20/11 1259

## 2012-01-20 ENCOUNTER — Other Ambulatory Visit: Payer: Self-pay | Admitting: Family Medicine

## 2012-01-20 DIAGNOSIS — Z1231 Encounter for screening mammogram for malignant neoplasm of breast: Secondary | ICD-10-CM

## 2012-01-23 ENCOUNTER — Ambulatory Visit
Admission: RE | Admit: 2012-01-23 | Discharge: 2012-01-23 | Disposition: A | Discharge status: Home / Self Care | Payer: Medicare Other | Source: Ambulatory Visit | Attending: Family Medicine | Admitting: Family Medicine

## 2012-01-23 DIAGNOSIS — Z1231 Encounter for screening mammogram for malignant neoplasm of breast: Secondary | ICD-10-CM

## 2012-07-26 ENCOUNTER — Other Ambulatory Visit: Payer: Self-pay | Admitting: Orthopedic Surgery

## 2012-07-26 DIAGNOSIS — M25512 Pain in left shoulder: Secondary | ICD-10-CM

## 2012-08-04 ENCOUNTER — Ambulatory Visit
Admission: RE | Admit: 2012-08-04 | Discharge: 2012-08-04 | Disposition: A | Discharge status: Home / Self Care | Payer: Medicare Other | Source: Ambulatory Visit | Attending: Orthopedic Surgery | Admitting: Orthopedic Surgery

## 2012-08-04 DIAGNOSIS — M25512 Pain in left shoulder: Secondary | ICD-10-CM

## 2012-08-04 MED ORDER — IOHEXOL 180 MG/ML  SOLN
15.0000 mL | Freq: Once | INTRAMUSCULAR | Status: AC | PRN
  Administered 2012-08-04: 15 mL via INTRA_ARTICULAR

## 2012-08-19 ENCOUNTER — Encounter (HOSPITAL_COMMUNITY): Payer: Self-pay

## 2012-08-19 ENCOUNTER — Other Ambulatory Visit: Payer: Self-pay | Admitting: Orthopedic Surgery

## 2012-08-19 NOTE — Pre-Procedure Instructions (Signed)
ANASTAZJA ISAAC  08/19/2012   Your procedure is scheduled on: Tuesday, April 15th.  Report to [facility] at 9:00AM.  Call this number if you have problems the morning of surgery: [phone]   Remember:   Do not eat food or drink liquids after midnight.   Take these medicines the morning of surgery with A SIP OF WATER:  Paroxetine (Paxil) 60 mg tablet amLODipine (NORVASC) 10 MG tablet May use mometasone (NASONEX) 50 MCG/ACT nasal spray If needed take: nitroGLYCERIN (NITROSTAT) 0.4 MG SL tablet acetaminophen (TYLENOL) 500 MG tablet   Do not wear jewelry, make-up or nail polish.  Do not wear lotions, powders, or perfumes.  Do not shave 48 hours prior to surgery.  Do not bring valuables to the hospital.  Contacts, dentures or bridgework may not be worn into surgery.  Leave suitcase in the car. After surgery it may be brought to your room.  For patients admitted to the hospital, checkout time is 11:00 AM the day of discharge.   Patients discharged the day of surgery will not be allowed to drive home.  Name and phone number of your driver: Family/Friend  Special Instructions: Shower using CHG 2 nights before surgery and the night before surgery.  If you shower the day of surgery use CHG.  Use special wash - you have one bottle of CHG for all showers.  You should use approximately 1/3 of the bottle for each shower.   Please read over the following fact sheets that you were given: Pain Booklet, Coughing and Deep Breathing and Surgical Site Infection Prevention

## 2012-08-20 ENCOUNTER — Encounter (HOSPITAL_COMMUNITY): Payer: Self-pay

## 2012-08-20 ENCOUNTER — Encounter (HOSPITAL_COMMUNITY)
Admission: RE | Admit: 2012-08-20 | Discharge: 2012-08-20 | Disposition: A | Discharge status: Home / Self Care | Payer: Medicare Other | Source: Ambulatory Visit | Attending: Orthopedic Surgery | Admitting: Orthopedic Surgery

## 2012-08-20 ENCOUNTER — Ambulatory Visit (HOSPITAL_COMMUNITY)
Admission: RE | Admit: 2012-08-20 | Discharge: 2012-08-20 | Disposition: A | Discharge status: Home / Self Care | Payer: Medicare Other | Source: Ambulatory Visit | Attending: Orthopedic Surgery | Admitting: Orthopedic Surgery

## 2012-08-20 DIAGNOSIS — I517 Cardiomegaly: Secondary | ICD-10-CM | POA: Insufficient documentation

## 2012-08-20 DIAGNOSIS — G4730 Sleep apnea, unspecified: Secondary | ICD-10-CM | POA: Insufficient documentation

## 2012-08-20 DIAGNOSIS — Z01818 Encounter for other preprocedural examination: Secondary | ICD-10-CM | POA: Insufficient documentation

## 2012-08-20 DIAGNOSIS — I10 Essential (primary) hypertension: Secondary | ICD-10-CM | POA: Insufficient documentation

## 2012-08-20 DIAGNOSIS — Z01812 Encounter for preprocedural laboratory examination: Secondary | ICD-10-CM | POA: Insufficient documentation

## 2012-08-20 HISTORY — DX: Endothelial corneal dystrophy: H18.51

## 2012-08-20 HISTORY — DX: Unspecified hearing loss, unspecified ear: H91.90

## 2012-08-20 HISTORY — DX: Pure hypercholesterolemia, unspecified: E78.00

## 2012-08-20 HISTORY — DX: Frequency of micturition: R35.0

## 2012-08-20 HISTORY — DX: Major depressive disorder, single episode, unspecified: F32.9

## 2012-08-20 HISTORY — DX: Unspecified osteoarthritis, unspecified site: M19.90

## 2012-08-20 HISTORY — DX: Anxiety disorder, unspecified: F41.9

## 2012-08-20 HISTORY — DX: Endothelial corneal dystrophy, unspecified eye: H18.519

## 2012-08-20 HISTORY — DX: Muscle spasm of back: M62.830

## 2012-08-20 HISTORY — DX: Insomnia, unspecified: G47.00

## 2012-08-20 HISTORY — DX: Depression, unspecified: F32.A

## 2012-08-20 LAB — CBC
HCT: 36 % (ref 36.0–46.0)
Hemoglobin: 12.4 g/dL (ref 12.0–15.0)
MCH: 30.1 pg (ref 26.0–34.0)
MCHC: 34.4 g/dL (ref 30.0–36.0)
MCV: 87.4 fL (ref 78.0–100.0)
RBC: 4.12 MIL/uL (ref 3.87–5.11)

## 2012-08-20 LAB — BASIC METABOLIC PANEL
BUN: 21 mg/dL (ref 6–23)
CO2: 27 mEq/L (ref 19–32)
Chloride: 100 mEq/L (ref 96–112)
GFR calc non Af Amer: 82 mL/min — ABNORMAL LOW (ref >90)
Glucose, Bld: 167 mg/dL — ABNORMAL HIGH (ref 70–99)
Potassium: 3.6 mEq/L (ref 3.5–5.1)
Sodium: 139 mEq/L (ref 135–145)

## 2012-08-20 NOTE — Progress Notes (Signed)
Nurse called Bryson Dames at Dr. Diamantina Providence office and left a VM requesting that Dr. August Saucer sign orders.

## 2012-08-20 NOTE — Progress Notes (Signed)
Patient informed Nurse that she had a stress test (but unaware where or performed by whom), a cardiac cath at [facility] (08/14/2008 No PCI), and wears a CPAP machine at bedtime. Cardiologist was Dr. Verdis Prime but patient no longer sees him. PCP is Dr. Barton Fanny at [place].

## 2012-08-23 MED ORDER — CEFAZOLIN SODIUM-DEXTROSE 2-3 GM-% IV SOLR
2.0000 g | INTRAVENOUS | Status: AC
  Administered 2012-08-24: 2 g via INTRAVENOUS
  Filled 2012-08-23: qty 50

## 2012-08-24 ENCOUNTER — Other Ambulatory Visit (HOSPITAL_COMMUNITY): Payer: Self-pay | Admitting: Orthopedic Surgery

## 2012-08-24 ENCOUNTER — Ambulatory Visit (HOSPITAL_COMMUNITY)
Admission: RE | Admit: 2012-08-24 | Discharge: 2012-08-24 | Disposition: A | Discharge status: Home / Self Care | Payer: Medicare Other | Source: Ambulatory Visit | Attending: Orthopedic Surgery | Admitting: Orthopedic Surgery

## 2012-08-24 ENCOUNTER — Encounter (HOSPITAL_COMMUNITY)
Admission: RE | Disposition: A | Discharge status: Home / Self Care | Payer: Self-pay | Source: Ambulatory Visit | Attending: Orthopedic Surgery

## 2012-08-24 ENCOUNTER — Encounter (HOSPITAL_COMMUNITY): Payer: Self-pay | Admitting: *Deleted

## 2012-08-24 ENCOUNTER — Encounter (HOSPITAL_COMMUNITY): Payer: Self-pay | Admitting: Certified Registered Nurse Anesthetist

## 2012-08-24 ENCOUNTER — Ambulatory Visit (HOSPITAL_COMMUNITY): Payer: Medicare Other | Admitting: Certified Registered Nurse Anesthetist

## 2012-08-24 DIAGNOSIS — M719 Bursopathy, unspecified: Secondary | ICD-10-CM | POA: Insufficient documentation

## 2012-08-24 DIAGNOSIS — F411 Generalized anxiety disorder: Secondary | ICD-10-CM | POA: Insufficient documentation

## 2012-08-24 DIAGNOSIS — I10 Essential (primary) hypertension: Secondary | ICD-10-CM | POA: Insufficient documentation

## 2012-08-24 DIAGNOSIS — H18519 Endothelial corneal dystrophy, unspecified eye: Secondary | ICD-10-CM | POA: Insufficient documentation

## 2012-08-24 DIAGNOSIS — S43439A Superior glenoid labrum lesion of unspecified shoulder, initial encounter: Secondary | ICD-10-CM | POA: Insufficient documentation

## 2012-08-24 DIAGNOSIS — E119 Type 2 diabetes mellitus without complications: Secondary | ICD-10-CM | POA: Insufficient documentation

## 2012-08-24 DIAGNOSIS — R011 Cardiac murmur, unspecified: Secondary | ICD-10-CM | POA: Insufficient documentation

## 2012-08-24 DIAGNOSIS — K219 Gastro-esophageal reflux disease without esophagitis: Secondary | ICD-10-CM | POA: Insufficient documentation

## 2012-08-24 DIAGNOSIS — M129 Arthropathy, unspecified: Secondary | ICD-10-CM | POA: Insufficient documentation

## 2012-08-24 DIAGNOSIS — E7800 Pure hypercholesterolemia, unspecified: Secondary | ICD-10-CM | POA: Insufficient documentation

## 2012-08-24 DIAGNOSIS — F329 Major depressive disorder, single episode, unspecified: Secondary | ICD-10-CM | POA: Insufficient documentation

## 2012-08-24 DIAGNOSIS — Z886 Allergy status to analgesic agent status: Secondary | ICD-10-CM | POA: Insufficient documentation

## 2012-08-24 DIAGNOSIS — F3289 Other specified depressive episodes: Secondary | ICD-10-CM | POA: Insufficient documentation

## 2012-08-24 DIAGNOSIS — W19XXXA Unspecified fall, initial encounter: Secondary | ICD-10-CM | POA: Insufficient documentation

## 2012-08-24 DIAGNOSIS — Z888 Allergy status to other drugs, medicaments and biological substances status: Secondary | ICD-10-CM | POA: Insufficient documentation

## 2012-08-24 DIAGNOSIS — Z885 Allergy status to narcotic agent status: Secondary | ICD-10-CM | POA: Insufficient documentation

## 2012-08-24 DIAGNOSIS — M5380 Other specified dorsopathies, site unspecified: Secondary | ICD-10-CM | POA: Insufficient documentation

## 2012-08-24 DIAGNOSIS — M67919 Unspecified disorder of synovium and tendon, unspecified shoulder: Secondary | ICD-10-CM | POA: Insufficient documentation

## 2012-08-24 DIAGNOSIS — G4730 Sleep apnea, unspecified: Secondary | ICD-10-CM | POA: Insufficient documentation

## 2012-08-24 DIAGNOSIS — S43432S Superior glenoid labrum lesion of left shoulder, sequela: Secondary | ICD-10-CM

## 2012-08-24 HISTORY — PX: SHOULDER ARTHROSCOPY WITH SUBACROMIAL DECOMPRESSION AND BICEP TENDON REPAIR: SHX5689

## 2012-08-24 LAB — GLUCOSE, CAPILLARY
Glucose-Capillary: 117 mg/dL — ABNORMAL HIGH (ref 70–99)
Glucose-Capillary: 134 mg/dL — ABNORMAL HIGH (ref 70–99)

## 2012-08-24 SURGERY — SHOULDER ARTHROSCOPY WITH SUBACROMIAL DECOMPRESSION AND BICEP TENDON REPAIR
Anesthesia: General | Laterality: Left | Wound class: Clean

## 2012-08-24 MED ORDER — BUPIVACAINE HCL (PF) 0.25 % IJ SOLN
INTRAMUSCULAR | Status: AC
  Filled 2012-08-24: qty 30

## 2012-08-24 MED ORDER — LIDOCAINE HCL (CARDIAC) 20 MG/ML IV SOLN
INTRAVENOUS | Status: DC | PRN
  Administered 2012-08-24: 100 mg via INTRAVENOUS

## 2012-08-24 MED ORDER — PROPOFOL 10 MG/ML IV BOLUS
INTRAVENOUS | Status: DC | PRN
  Administered 2012-08-24: 140 mg via INTRAVENOUS

## 2012-08-24 MED ORDER — CLONIDINE HCL (ANALGESIA) 100 MCG/ML EP SOLN
EPIDURAL | Status: DC | PRN
  Administered 2012-08-24: 1000 ug

## 2012-08-24 MED ORDER — MORPHINE SULFATE 4 MG/ML IJ SOLN
INTRAMUSCULAR | Status: DC | PRN
  Administered 2012-08-24: 4 mg via INTRAVENOUS

## 2012-08-24 MED ORDER — EPINEPHRINE HCL 1 MG/ML IJ SOLN
INTRAMUSCULAR | Status: AC
  Filled 2012-08-24: qty 1

## 2012-08-24 MED ORDER — LACTATED RINGERS IV SOLN
INTRAVENOUS | Status: DC | PRN
  Administered 2012-08-24: 12:00:00 via INTRAVENOUS

## 2012-08-24 MED ORDER — LIDOCAINE HCL 4 % MT SOLN
OROMUCOSAL | Status: DC | PRN
  Administered 2012-08-24: 4 mL via TOPICAL

## 2012-08-24 MED ORDER — ARTIFICIAL TEARS OP OINT
TOPICAL_OINTMENT | OPHTHALMIC | Status: DC | PRN
  Administered 2012-08-24: 1 via OPHTHALMIC

## 2012-08-24 MED ORDER — GLYCOPYRROLATE 0.2 MG/ML IJ SOLN
INTRAMUSCULAR | Status: DC | PRN
  Administered 2012-08-24: .6 mg via INTRAVENOUS

## 2012-08-24 MED ORDER — PHENYLEPHRINE HCL 10 MG/ML IJ SOLN
INTRAMUSCULAR | Status: DC | PRN
  Administered 2012-08-24 (×2): 40 ug via INTRAVENOUS

## 2012-08-24 MED ORDER — BUPIVACAINE HCL 0.5 % IJ SOLN
INTRAMUSCULAR | Status: DC | PRN
  Administered 2012-08-24: 10 mL

## 2012-08-24 MED ORDER — CHLORHEXIDINE GLUCONATE 4 % EX LIQD: 60.0000 mL | Freq: Once | CUTANEOUS | Status: DC

## 2012-08-24 MED ORDER — SODIUM CHLORIDE 0.9 % IJ SOLN
INTRAMUSCULAR | Status: DC | PRN
  Administered 2012-08-24: 20 mL

## 2012-08-24 MED ORDER — METHYLPREDNISOLONE ACETATE 40 MG/ML IJ SUSP
INTRAMUSCULAR | Status: AC
  Filled 2012-08-24: qty 1

## 2012-08-24 MED ORDER — ONDANSETRON HCL 4 MG/2ML IJ SOLN
INTRAMUSCULAR | Status: DC | PRN
  Administered 2012-08-24: 4 mg via INTRAVENOUS

## 2012-08-24 MED ORDER — SODIUM CHLORIDE 0.9 % IR SOLN
Status: DC | PRN
  Administered 2012-08-24: 3000 mL

## 2012-08-24 MED ORDER — HYDROMORPHONE HCL PF 1 MG/ML IJ SOLN: 0.2500 mg | INTRAMUSCULAR | Status: DC | PRN

## 2012-08-24 MED ORDER — LACTATED RINGERS IV SOLN
INTRAVENOUS | Status: DC
  Administered 2012-08-24: 11:00:00 via INTRAVENOUS

## 2012-08-24 MED ORDER — NEOSTIGMINE METHYLSULFATE 1 MG/ML IJ SOLN
INTRAMUSCULAR | Status: DC | PRN
  Administered 2012-08-24: 4 mg via INTRAVENOUS

## 2012-08-24 MED ORDER — MORPHINE SULFATE 4 MG/ML IJ SOLN
INTRAMUSCULAR | Status: AC
  Filled 2012-08-24: qty 1

## 2012-08-24 MED ORDER — ROCURONIUM BROMIDE 100 MG/10ML IV SOLN
INTRAVENOUS | Status: DC | PRN
  Administered 2012-08-24: 40 mg via INTRAVENOUS
  Administered 2012-08-24: 10 mg via INTRAVENOUS

## 2012-08-24 MED ORDER — BUPIVACAINE-EPINEPHRINE 0.5% -1:200000 IJ SOLN
INTRAMUSCULAR | Status: DC | PRN
  Administered 2012-08-24: 30 mL

## 2012-08-24 MED ORDER — FENTANYL CITRATE 0.05 MG/ML IJ SOLN
INTRAMUSCULAR | Status: DC | PRN
  Administered 2012-08-24: 75 ug via INTRAVENOUS
  Administered 2012-08-24: 50 ug via INTRAVENOUS
  Administered 2012-08-24: 75 ug via INTRAVENOUS
  Administered 2012-08-24: 50 ug via INTRAVENOUS

## 2012-08-24 MED ORDER — HYDROMORPHONE HCL 2 MG PO TABS: 2.0000 mg | ORAL_TABLET | ORAL | Status: DC | PRN

## 2012-08-24 MED ORDER — DEXAMETHASONE SODIUM PHOSPHATE 10 MG/ML IJ SOLN
INTRAMUSCULAR | Status: DC | PRN
  Administered 2012-08-24: 8 mg via INTRAVENOUS

## 2012-08-24 MED ORDER — SODIUM CHLORIDE 0.9 % IJ SOLN
INTRAMUSCULAR | Status: AC
  Filled 2012-08-24: qty 15

## 2012-08-24 MED ORDER — EPINEPHRINE HCL 1 MG/ML IJ SOLN
INTRAMUSCULAR | Status: DC | PRN
  Administered 2012-08-24: .015 mL

## 2012-08-24 MED ORDER — ONDANSETRON HCL 4 MG/2ML IJ SOLN: 4.0000 mg | Freq: Once | INTRAMUSCULAR | Status: DC | PRN

## 2012-08-24 MED ORDER — BUPIVACAINE HCL (PF) 0.5 % IJ SOLN
INTRAMUSCULAR | Status: AC
  Filled 2012-08-24: qty 30

## 2012-08-24 MED ORDER — EPHEDRINE SULFATE 50 MG/ML IJ SOLN
INTRAMUSCULAR | Status: DC | PRN
  Administered 2012-08-24 (×3): 5 mg via INTRAVENOUS

## 2012-08-24 MED ORDER — CEFAZOLIN SODIUM-DEXTROSE 2-3 GM-% IV SOLR
INTRAVENOUS | Status: AC
  Filled 2012-08-24: qty 50

## 2012-08-24 SURGICAL SUPPLY — 77 items
APL SKNCLS STERI-STRIP NONHPOA (GAUZE/BANDAGES/DRESSINGS) ×1
BENZOIN TINCTURE PRP APPL 2/3 (GAUZE/BANDAGES/DRESSINGS) ×2 IMPLANT
BIT DRILL TAK (DRILL) IMPLANT
BLADE CUDA 5.5 (BLADE) IMPLANT
BLADE CUTTER GATOR 3.5 (BLADE) ×2 IMPLANT
BLADE GREAT WHITE 4.2 (BLADE) ×3 IMPLANT
BLADE SURG 11 STRL SS (BLADE) ×2 IMPLANT
BUR GATOR 2.9 (BURR) IMPLANT
BUR OVAL 6.0 (BURR) ×2 IMPLANT
CANNULA SHOULDER 7CM (CANNULA) IMPLANT
CARTRIDGE CURVETEK MED (MISCELLANEOUS) IMPLANT
CARTRIDGE CURVETEK XLRG (MISCELLANEOUS) IMPLANT
CLOTH BEACON ORANGE TIMEOUT ST (SAFETY) ×2 IMPLANT
COVER SURGICAL LIGHT HANDLE (MISCELLANEOUS) ×2 IMPLANT
DRAPE INCISE IOBAN 66X45 STRL (DRAPES) ×4 IMPLANT
DRAPE STERI 35X30 U-POUCH (DRAPES) ×2 IMPLANT
DRAPE U-SHAPE 47X51 STRL (DRAPES) ×4 IMPLANT
DRILL TAK (DRILL)
DRSG PAD ABDOMINAL 8X10 ST (GAUZE/BANDAGES/DRESSINGS) ×5 IMPLANT
DURAPREP 26ML APPLICATOR (WOUND CARE) ×2 IMPLANT
ELECT MENISCUS 165MM 90D (ELECTRODE) IMPLANT
ELECT REM PT RETURN 9FT ADLT (ELECTROSURGICAL) ×2
ELECTRODE REM PT RTRN 9FT ADLT (ELECTROSURGICAL) ×1 IMPLANT
FILTER STRAW FLUID ASPIR (MISCELLANEOUS) ×2 IMPLANT
GAUZE XEROFORM 1X8 LF (GAUZE/BANDAGES/DRESSINGS) ×2 IMPLANT
GLOVE BIO SURGEON ST LM GN SZ9 (GLOVE) ×2 IMPLANT
GLOVE BIOGEL PI IND STRL 8 (GLOVE) ×1 IMPLANT
GLOVE BIOGEL PI INDICATOR 8 (GLOVE) ×1
GLOVE SURG ORTHO 8.0 STRL STRW (GLOVE) ×2 IMPLANT
GOWN PREVENTION PLUS LG XLONG (DISPOSABLE) IMPLANT
GOWN STRL NON-REIN LRG LVL3 (GOWN DISPOSABLE) ×6 IMPLANT
KIT BASIN OR (CUSTOM PROCEDURE TRAY) ×2 IMPLANT
KIT ROOM TURNOVER OR (KITS) ×2 IMPLANT
MANIFOLD NEPTUNE II (INSTRUMENTS) ×2 IMPLANT
NDL 18GX1X1/2 (RX/OR ONLY) (NEEDLE) IMPLANT
NDL FILTER BLUNT 18X1 1/2 (NEEDLE) IMPLANT
NDL HYPO 25X1 1.5 SAFETY (NEEDLE) ×1 IMPLANT
NDL SPNL 18GX3.5 QUINCKE PK (NEEDLE) ×1 IMPLANT
NDL SUT 6 .5 CRC .975X.05 MAYO (NEEDLE) ×1 IMPLANT
NEEDLE 18GX1X1/2 (RX/OR ONLY) (NEEDLE) ×2 IMPLANT
NEEDLE FILTER BLUNT 18X 1/2SAF (NEEDLE) ×1
NEEDLE FILTER BLUNT 18X1 1/2 (NEEDLE) ×1 IMPLANT
NEEDLE HYPO 25X1 1.5 SAFETY (NEEDLE) ×2 IMPLANT
NEEDLE MAYO TAPER (NEEDLE) ×2
NEEDLE SPNL 18GX3.5 QUINCKE PK (NEEDLE) ×2 IMPLANT
NS IRRIG 1000ML POUR BTL (IV SOLUTION) ×2 IMPLANT
PACK SHOULDER (CUSTOM PROCEDURE TRAY) ×2 IMPLANT
PAD ARMBOARD 7.5X6 YLW CONV (MISCELLANEOUS) ×4 IMPLANT
SET ARTHROSCOPY TUBING (MISCELLANEOUS) ×2
SET ARTHROSCOPY TUBING LN (MISCELLANEOUS) ×1 IMPLANT
SLING ARM IMMOBILIZER LRG (SOFTGOODS) ×1 IMPLANT
SLING ARM IMMOBILIZER MED (SOFTGOODS) IMPLANT
SPEAR FASTAKII (SLEEVE) IMPLANT
SPONGE GAUZE 4X4 12PLY (GAUZE/BANDAGES/DRESSINGS) ×2 IMPLANT
SPONGE LAP 4X18 X RAY DECT (DISPOSABLE) ×4 IMPLANT
STRIP CLOSURE SKIN 1/2X4 (GAUZE/BANDAGES/DRESSINGS) ×2 IMPLANT
SUCTION FRAZIER TIP 10 FR DISP (SUCTIONS) ×2 IMPLANT
SUT ETHILON 3 0 PS 1 (SUTURE) ×2 IMPLANT
SUT FIBERWIRE 2-0 18 17.9 3/8 (SUTURE)
SUT PROLENE 3 0 PS 2 (SUTURE) ×3 IMPLANT
SUT VIC AB 0 CT1 27 (SUTURE) ×4
SUT VIC AB 0 CT1 27XBRD ANBCTR (SUTURE) ×2 IMPLANT
SUT VIC AB 1 CT1 27 (SUTURE) ×2
SUT VIC AB 1 CT1 27XBRD ANBCTR (SUTURE) ×1 IMPLANT
SUT VIC AB 2-0 CT1 27 (SUTURE) ×2
SUT VIC AB 2-0 CT1 TAPERPNT 27 (SUTURE) ×1 IMPLANT
SUT VICRYL 0 UR6 27IN ABS (SUTURE) IMPLANT
SUTURE FIBERWR 2-0 18 17.9 3/8 (SUTURE) IMPLANT
SYR 20CC LL (SYRINGE) ×5 IMPLANT
SYR 3ML LL SCALE MARK (SYRINGE) ×2 IMPLANT
SYR TB 1ML LUER SLIP (SYRINGE) ×4 IMPLANT
SYRINGE 10CC LL (SYRINGE) ×1 IMPLANT
TAPE CLOTH SURG 6X10 WHT LF (GAUZE/BANDAGES/DRESSINGS) ×1 IMPLANT
TOWEL OR 17X24 6PK STRL BLUE (TOWEL DISPOSABLE) ×2 IMPLANT
TOWEL OR 17X26 10 PK STRL BLUE (TOWEL DISPOSABLE) ×2 IMPLANT
WAND 90 DEG TURBOVAC W/CORD (SURGICAL WAND) IMPLANT
WATER STERILE IRR 1000ML POUR (IV SOLUTION) ×2 IMPLANT

## 2012-08-24 NOTE — Progress Notes (Signed)
Walked to bathroom with no SOB no difficulty pt has had no pain medication O2 sats drop to 89 on room air . Dr. Noreene Larsson called to come examine patient to see if may go home pt uses sleep apnea machine at home. Pulling 1250-1500 on inspirometer with out difficulty

## 2012-08-24 NOTE — Anesthesia Procedure Notes (Signed)
Procedure Name: Intubation Date/Time: 08/24/2012 11:57 AM Performed by: Angelica Pou Pre-anesthesia Checklist: Patient identified, Timeout performed, Emergency Drugs available, Suction available and Patient being monitored Patient Re-evaluated:Patient Re-evaluated prior to inductionOxygen Delivery Method: Circle system utilized Preoxygenation: Pre-oxygenation with 100% oxygen Intubation Type: IV induction Ventilation: Mask ventilation without difficulty and Oral airway inserted - appropriate to patient size Laryngoscope Size: Mac and 4 Grade View: Grade III Tube type: Oral Tube size: 7.5 mm Number of attempts: 1 Airway Equipment and Method: Stylet and Oral airway Placement Confirmation: ETT inserted through vocal cords under direct vision,  breath sounds checked- equal and bilateral and positive ETCO2 Secured at: 23 cm Tube secured with: Tape Dental Injury: Teeth and Oropharynx as per pre-operative assessment

## 2012-08-24 NOTE — Progress Notes (Signed)
Dr. Noreene Larsson came by and examined patient she did inspirometer to 1250 talked with pt who denies shortness of breathe and told  Dr. Noreene Larsson she does use her sleep apnea every night instructed to use sleep apnea machine when taking pain med and if sleepy.

## 2012-08-24 NOTE — Anesthesia Postprocedure Evaluation (Signed)
Anesthesia Post Note  Patient: Kimberly [name]  Procedure(s) Performed: Procedure(s) (LRB): SHOULDER ARTHROSCOPY WITH SUBACROMIAL DECOMPRESSION AND BICEP TENDON REPAIR (Left)  Anesthesia type: General  Patient location: PACU  Post pain: Pain level controlled and Adequate analgesia  Post assessment: Post-op Vital signs reviewed, Patient's Cardiovascular Status Stable, Respiratory Function Stable, Patent Airway and Pain level controlled  Last Vitals:  Filed Vitals:   08/24/12 1415  BP: 119/48  Pulse: 82  Temp:   Resp: 23    Post vital signs: Reviewed and stable  Level of consciousness: awake, alert  and oriented  Complications: No apparent anesthesia complications

## 2012-08-24 NOTE — Preoperative (Signed)
Beta Blockers   Reason not to administer Beta Blockers:Not Applicable 

## 2012-08-24 NOTE — Progress Notes (Signed)
Very sleepy arouses easily denies pain when O2 off O2 sat drops encourage to C & DP

## 2012-08-24 NOTE — Anesthesia Preprocedure Evaluation (Signed)
Anesthesia Evaluation  Patient identified by MRN, date of birth, ID band Patient awake    Reviewed: Allergy & Precautions, H&P , NPO status , Patient's Chart, lab work & pertinent test results  History of Anesthesia Complications (+) PONV  Airway Mallampati: II TM Distance: >3 FB Neck ROM: full    Dental   Pulmonary sleep apnea and Continuous Positive Airway Pressure Ventilation ,          Cardiovascular hypertension, Rhythm:regular Rate:Normal     Neuro/Psych Anxiety    GI/Hepatic GERD-  ,  Endo/Other  diabetes, Well Controlled, Type 2  Renal/GU      Musculoskeletal   Abdominal   Peds  Hematology   Anesthesia Other Findings   Reproductive/Obstetrics                           Anesthesia Physical Anesthesia Plan  ASA: III  Anesthesia Plan: General   Post-op Pain Management:    Induction: Intravenous  Airway Management Planned: Oral ETT  Additional Equipment:   Intra-op Plan:   Post-operative Plan: Extubation in OR  Informed Consent: I have reviewed the patients History and Physical, chart, labs and discussed the procedure including the risks, benefits and alternatives for the proposed anesthesia with the patient or authorized representative who has indicated his/her understanding and acceptance.     Plan Discussed with: CRNA, Anesthesiologist and Surgeon  Anesthesia Plan Comments:         Anesthesia Quick Evaluation

## 2012-08-24 NOTE — H&P (Signed)
Kimberly [name] is an 72 y.o. female.   Chief Complaint: left shoulder pain HPI: Kimberly [name] is a 72 year old female with left shoulder pain. She sustained a fall approximately one to 2 months ago. She has had a previous rotator cuff repair on that side and was concerned she reinjured the shoulder. Previous rotator cuff repair was over 10 years ago. She had an injection which did not give her sustained relief. She has continuous debilitating pain in her left shoulder which interferes with her ADLs and sleep. Patient reports mechanical symptoms in the shoulder. She's taken every counter medications as well as some pain medication without relief.  Past Medical History  Diagnosis Date  . Hypertension   . Acid reflux   . HOH (hard of hearing)     wears hearing aid in left ear  . Sleep apnea     CPAP at bedtime  . Shortness of breath     SOB if laid flat  . PONV (postoperative nausea and vomiting)   . Heart murmur   . Frequency of urination   . Hypercholesteremia   . Anxiety   . Mental disorder   . Depression   . Arthritis   . Insomnia   . Back spasm   . Fuch's endothelial dystrophy     bilateral eyes    Past Surgical History  Procedure Laterality Date  . Cesarean section      x 2  . Cholecystectomy    . Abdominal hysterectomy    . Appendectomy    . Rotator cuff repair Left   . Eye surgery Right     implant  . Colonoscopy    . Cardiac catheterization      no PCI    No family history on file. Social History:  reports that she has never smoked. She does not have any smokeless tobacco history on file. She reports that she does not drink alcohol or use illicit drugs.  Allergies:  Allergies  Allergen Reactions  . Percocet (Oxycodone-Acetaminophen) Shortness Of Breath and Other (See Comments)    Pt couldn't breathe or catch her breath  . Codeine Other (See Comments)    Jittery. Pt stated "I climbed the wall, I just couldn't be still."  . Other Other (See Comments)    Some  pain medication given at [facility] for surgery caused hallucinations   . Phenergan (Promethazine Hcl) Other (See Comments)    Hallucinations     No prescriptions prior to admission    No results found for this or any previous visit (from the past 48 hour(s)). No results found.  Review of Systems  Constitutional: Negative.   HENT: Negative.   Eyes: Negative.   Cardiovascular: Negative.   Genitourinary: Negative.   Musculoskeletal: Positive for joint pain.  Skin: Negative.   Neurological: Negative.   Endo/Heme/Allergies: Negative.   Psychiatric/Behavioral: Negative.     There were no vitals taken for this visit. Physical Exam  Constitutional: She appears well-developed.  HENT:  Head: Normocephalic.  Eyes: Pupils are equal, round, and reactive to light.  Neck: Normal range of motion.  Cardiovascular: Normal rate.   Respiratory: Effort normal.  GI: Soft.  Neurological: She is alert.  Skin: Skin is warm.  Psychiatric: She has a normal mood and affect.   examination the left shoulder demonstrates a well-healed previous surgical incision the cervical spine range of motion no paresthesias C5-T1 reflexes symmetric radial pulse intact coarse granular crepitus with active or passive range of motion of  the shoulder. Her rotator cuff strength is reasonable to interspace effacement of testing, the left the right-hand side apprised testing is positive impingement signs positive no a.c. Joint tenderness on the left to direct palpation  Assessment/Plan Impression is left shoulder pain. MRI scan shows nondisplaced SLAP tear. Previous rotator cuff repair appears intact on this MRI arthrogram study. Evidence of previous subacromial decompression also present. Plan is operative arthroscopy with release of the biceps tendon and bursectomy. The risk and benefits of this procedure discussed with the patient that a limited to infection incomplete pain relief shoulder stiffness as well as a  deformity in the proximal humerus region from the biceps tenotomy which in her arm I don't think we've particularly noticeable. Patient understands risk and benefits of surgical intervention and wished to proceed with arthroscopic evaluation but not any type of the open procedure because of her experience with her previous rotator cuff repair. I think this procedure does offer her a chance of improvement pain relief based on the pathology present and failure of other conservative measures all questions answered  Kimberly [name] 08/24/2012, 7:23 AM

## 2012-08-24 NOTE — Brief Op Note (Signed)
08/24/2012  1:31 PM  PATIENT:  Higinio Plan  72 y.o. female  PRE-OPERATIVE DIAGNOSIS:  Left Shoulder SLAP tear  POST-OPERATIVE DIAGNOSIS:  Left Shoulder SLAP tear  PROCEDURE:  Procedure(s): SHOULDER ARTHROSCOPY WITH SUBACROMIAL DECOMPRESSION AND BICEP tenotomy and extensive debridement  SURGEON:  Surgeon(s): Cammy Copa, MD  ASSISTANT:   ANESTHESIA:   general  EBL: 5 ml       BLOOD ADMINISTERED: none  DRAINS: none   LOCAL MEDICATIONS USED:  none  SPECIMEN:  No Specimen  COUNTS:  YES  TOURNIQUET:  * No tourniquets in log *  DICTATION: .Other Dictation: Dictation Number [id number]  PLAN OF CARE: Discharge to home after PACU  PATIENT DISPOSITION:  PACU - hemodynamically stable

## 2012-08-24 NOTE — Progress Notes (Signed)
Os sat dros off of oxygen put back on 2 liters drops to 89  Given inspirometer encouraged to use able to [id number]

## 2012-08-24 NOTE — Transfer of Care (Signed)
Immediate Anesthesia Transfer of Care Note  Patient: Kimberly [name]  Procedure(s) Performed: Procedure(s) with comments: SHOULDER ARTHROSCOPY WITH SUBACROMIAL DECOMPRESSION AND BICEP TENDON REPAIR (Left) - Left Shoulder Arthroscopy, Debridement, Biceps Tenotomy  Patient Location: PACU  Anesthesia Type:General  Level of Consciousness: awake, oriented, sedated and patient cooperative  Airway & Oxygen Therapy: Patient Spontanous Breathing and Patient connected to nasal cannula oxygen  Post-op Assessment: Report given to PACU RN, Post -op Vital signs reviewed and stable and Patient moving all extremities  Post vital signs: Reviewed and stable  Complications: No apparent anesthesia complications

## 2012-08-25 ENCOUNTER — Encounter (HOSPITAL_COMMUNITY): Payer: Self-pay | Admitting: Orthopedic Surgery

## 2012-08-25 LAB — PTH, INTACT AND CALCIUM: PTH: 14.9 pg/mL (ref 14.0–72.0)

## 2012-08-25 NOTE — Op Note (Signed)
NAME:  Kimberly [name], Kimberly [name] NO.:  [id number]  MEDICAL RECORD NO.:  [id number]  LOCATION:  [facility]                         FACILITY:  [facility]  PHYSICIAN:  Burnard Bunting, M.D.    DATE OF BIRTH:  07-02-1940  DATE OF PROCEDURE: DATE OF DISCHARGE:  08/24/2012                              OPERATIVE REPORT   PREOPERATIVE DIAGNOSES:  Left shoulder type 2 SLAP tear and bursitis.  POSTOPERATIVE DIAGNOSIS:  Left shoulder type 2 SLAP tear and bursitis.  PROCEDURE:  Left shoulder diagnostic arthroscopy, extensive debridement of intra-articular arthritis on both the glenoid and humeral head. Release and debridement of the labrum, and biceps tenotomy.  Release as well as extensive debridement of rotator interval for early synovitis followed by subacromial decompression and bursectomy with minimal removal of anterolateral spurring.  SURGEON:  Burnard Bunting, MD.  ASSISTANT:  None.  ANESTHESIA:  General endotracheal.  ESTIMATED BLOOD LOSS:  Minimal.  INDICATIONS:  Kimberly [name] is a patient with pre-refractory shoulder pain, presents for operative management of biceps tendinitis and SLAP tear following failure of conservative management.  OPERATIVE FINDINGS: 1. Examination under anesthesia, range of motion 165 of forward     flexion, external rotation at 50 degrees, abduction is 60 with firm     endpoints.  Potentially some adhesions, which were broken up.     __________Girdle abduction is not 85.  Diagnostic arthroscopy 1. Glenohumeral arthritis grade 2-3 on both the humeral head and     glenoid surface. 2. Type 2 SLAP tear with inflammation and tendinitis of the biceps     tendon. 3. Synovitis of the rotator interval. 4. Partial thickness rotator cuff tearing. 5. Bursitis in the subacromial space with a small spur.  PROCEDURE IN DETAIL:  The patient was brought to the operating room, where general endotracheal anesthesia was achieved.  Preop antibiotics administered.   Time-out was called.  The patient was placed in the beach chair position with the head in neutral position.  Left arm, shoulder, and wrist were prescrubbed with alcohol, Betadine, after shaving and prepped with DuraPrep solution and draped in sterile manner.  Collier Flowers was used to seal the operative field and cover the axilla.  Solution of saline and epinephrine injected to the subacromial space.  The posterior portal created 2 cm medial and inferior to the pressure margins. Diagnostic arthroscopy was performed.  Anterior portal was created under direct visualization.  Significant synovitis and fraying and degeneration was seen within the shoulder joint.  The pricing amount of arthritis present not necessarily noted on the MRI scan.  Loose chondral flaps extensively debrided both the glenoid and humeral head side, type 2 SLAP tear identified degenerative in nature.  Biceps tendon was released.  Inflammation also present on the biceps tendon.  Rotator interval released for early synovitis as well as potential early frozen shoulder based on examination under anesthesia.  Following extensive intra-articular debridement and biceps tenotomy, scope was placed in the subacromial space.  Lateral portal was created.  Small spur removed the anterolateral portion of the acromion.  Bursectomy performed of CA ligament previously had been released.  No full thickness tear of the rotator cuff from the articular  or bursal surface noted.  Instruments were removed.  Then portals were closed using 3-0 nylon.  Solution of Marcaine, morphine finally injected both into the subacromial space and into the joint.  Shoulder sling applied along with bulky dressing.  The patient tolerated the procedure well without immediate complications. Transferred to the recovery room in stable condition.     Burnard Bunting, M.D.     GSD/MEDQ  D:  08/24/2012  T:  08/25/2012  Job:  [id number]

## 2013-08-29 ENCOUNTER — Ambulatory Visit (INDEPENDENT_AMBULATORY_CARE_PROVIDER_SITE_OTHER): Payer: Medicare Other | Admitting: Internal Medicine

## 2013-08-29 ENCOUNTER — Encounter: Payer: Self-pay | Admitting: Internal Medicine

## 2013-08-29 VITALS — BP 140/82 | HR 78 | Ht 60.0 in | Wt 190.4 lb

## 2013-08-29 DIAGNOSIS — G4733 Obstructive sleep apnea (adult) (pediatric): Secondary | ICD-10-CM

## 2013-08-29 NOTE — Patient Instructions (Signed)
[facility]- replacement [facility] old broken CPAP machine   15 cwp, humidifier, supplies, mask of choice   Dx OSA  Please call as needed

## 2013-08-29 NOTE — Assessment & Plan Note (Signed)
73 year old machine burned out. She has been very compliant. Satisfied with pressure 15 cwp. Hopefully we can find adequate documentation and she won't need new study, but she is now on Medicare. Plan- replace CPAP machine. Update documentation as necessary

## 2013-08-29 NOTE — Progress Notes (Signed)
08/29/13- [age] yoF never smoker referred courtesy of Dr Benetta SparVictoria Rankin-[facility]; CY started patient about 15 years ago on CPAP-DME is [facility]. Now here for sleep medicine consult. We are requesting old records seeking original sleep study.  Old CPAP machine began smoking. Unable to sleep without it. Bedtime 10PM, latency 30-45 minutes, up 9AM. Weight gain 8 lbs in last 2 years. No ENT surgery, lung or heart disease. Treated hypertension.   Prior to Admission medications   Medication Sig Start Date End Date Taking? Authorizing Provider  amLODipine (NORVASC) 10 MG tablet Take 20 mg by mouth daily.   Yes Historical Provider, MD  aspirin 81 MG chewable tablet Chew 81 mg by mouth daily.   Yes Historical Provider, MD  atorvastatin (LIPITOR) 40 MG tablet Take 40 mg by mouth daily.   Yes Historical Provider, MD  fenofibrate micronized (LOFIBRA) 200 MG capsule Take 200 mg by mouth daily before breakfast.   Yes Historical Provider, MD  HYDROmorphone (DILAUDID) 2 MG tablet Take 1 tablet (2 mg total) by mouth every 4 (four) hours as needed for pain. 08/24/12  Yes Cammy CopaGregory Scott Dean, MD  KRILL OIL PO Take 1 capsule by mouth daily.   Yes Historical Provider, MD  lisinopril-hydrochlorothiazide (PRINZIDE,ZESTORETIC) 20-12.5 MG per tablet Take 1 tablet by mouth daily.   Yes Historical Provider, MD  mometasone (NASONEX) 50 MCG/ACT nasal spray Place 2 sprays into the nose as needed (alleriges).    Yes Historical Provider, MD  nitroGLYCERIN (NITROSTAT) 0.4 MG SL tablet Place 0.4 mg under the tongue every 5 (five) minutes as needed for chest pain.    Yes Historical Provider, MD  PARoxetine (PAXIL) 20 MG tablet Take 60 mg by mouth every morning.   Yes Historical Provider, MD  zolpidem (AMBIEN) 10 MG tablet Take 5 mg by mouth at bedtime as needed. For sleep   Yes Historical Provider, MD  cholecalciferol (VITAMIN D) 1000 UNITS tablet Take 1,000 Units by mouth daily.    Historical Provider, MD    Past Medical History  Diagnosis Date  . Hypertension   . Acid reflux   . HOH (hard of hearing)     wears hearing aid in left ear  . Sleep apnea     CPAP at bedtime  . Shortness of breath     SOB if laid flat  . PONV (postoperative nausea and vomiting)   . Heart murmur   . Frequency of urination   . Hypercholesteremia   . Anxiety   . Mental disorder   . Depression   . Arthritis   . Insomnia   . Back spasm   . Fuch's endothelial dystrophy     bilateral eyes   Past Surgical History  Procedure Laterality Date  . Cesarean section      x 2  . Cholecystectomy    . Abdominal hysterectomy    . Appendectomy    . Rotator cuff repair Left   . Eye surgery Right     implant  . Colonoscopy    . Cardiac catheterization      no PCI  . Shoulder arthroscopy with subacromial decompression and bicep tendon repair Left 08/24/2012    Procedure: SHOULDER ARTHROSCOPY WITH SUBACROMIAL DECOMPRESSION AND BICEP TENDON REPAIR;  Surgeon: Cammy CopaGregory Scott Dean, MD;  [facility];  Service: Orthopedics;  Laterality: Left;  Left Shoulder Arthroscopy, Debridement, Biceps Tenotomy   Family History  Problem Relation Age of Onset  . Stomach cancer Mother   .  Cancer Sister     intestinal   History   Social History  . Marital Status: Married    Spouse Name: N/A    Number of Children: 2  . Years of Education: N/A   Occupational History  . retired-assistant teacher    Social History Main Topics  . Smoking status: Never Smoker   . Smokeless tobacco: Not on file  . Alcohol Use: No  . Drug Use: No  . Sexual Activity: Not on file   Other Topics Concern  . Not on file   Social History Narrative  . No narrative on file   ROS-see HPI Constitutional:   No-   weight loss, night sweats, fevers, chills, +fatigue, lassitude. HEENT:   + headaches, No-difficulty swallowing, tooth/dental problems, sore throat,       No-  sneezing, itching, ear ache, nasal congestion, post nasal drip,  CV:  +chest  pain, orthopnea, PND, swelling in lower extremities, anasarca,                                                     dizziness, +palpitations Resp: +shortness of breath with exertion or at rest.              No-   productive cough,  No non-productive cough,  No- coughing up of blood.              No-   change in color of mucus.  No- wheezing.   Skin: No-   rash or lesions. GI:  No-   heartburn, +indigestion, abdominal pain, nausea, vomiting, diarrhea,                 change in bowel habits, loss of appetite GU: No-   dysuria, change in color of urine, no urgency or frequency.  No- flank pain. MS:  No-   joint pain or swelling.  No- decreased range of motion.  No- back pain. Neuro-     nothing unusual Psych:  No- change in mood or affect. + depression or anxiety.  No memory loss.  OBJ- Physical Exam General- Alert, Oriented, Affect-appropriate, Distress- none acute, overweight Skin- rash-none, lesions- none, excoriation- none Lymphadenopathy- none Head- atraumatic            Eyes- Gross vision intact, PERRLA, conjunctivae and secretions clear            Ears- Hearing, canals-normal            Nose- Clear, no-Septal dev, mucus, polyps, erosion, perforation             Throat- Mallampati II-III , mucosa clear , drainage- none, tonsils- atrophic Neck- flexible , trachea midline, no stridor , thyroid nl, carotid no bruit Chest - symmetrical excursion , unlabored           Heart/CV- RRR , no murmur , no gallop  , no rub, nl s1 s2                           - JVD- none , edema- none, stasis changes- none, varices- none           Lung- clear to P&A, wheeze- none, cough- none , dullness-none, rub- none           Chest wall-  Abd- tender-no, distended-no,  bowel sounds-present, HSM- no Br/ Gen/ Rectal- Not done, not indicated Extrem- cyanosis- none, clubbing, none, atrophy- none, strength- nl Neuro- grossly intact to observation

## 2013-09-12 ENCOUNTER — Ambulatory Visit
Admission: RE | Admit: 2013-09-12 | Discharge: 2013-09-12 | Disposition: A | Discharge status: Home / Self Care | Payer: Medicare Other | Source: Ambulatory Visit | Attending: Family Medicine | Admitting: Family Medicine

## 2013-09-12 ENCOUNTER — Other Ambulatory Visit: Payer: Self-pay | Admitting: Family Medicine

## 2013-09-12 DIAGNOSIS — M545 Low back pain, unspecified: Secondary | ICD-10-CM

## 2013-09-12 DIAGNOSIS — M533 Sacrococcygeal disorders, not elsewhere classified: Secondary | ICD-10-CM

## 2013-09-15 ENCOUNTER — Telehealth: Payer: Self-pay | Admitting: Internal Medicine

## 2013-09-15 NOTE — Telephone Encounter (Signed)
Dr Maple HudsonYoung, have you seen any forms on this pt from DME? Please advise, and if not we can call and have them refax, thanks!

## 2013-09-20 ENCOUNTER — Encounter: Payer: Self-pay | Admitting: Internal Medicine

## 2013-09-20 ENCOUNTER — Ambulatory Visit (INDEPENDENT_AMBULATORY_CARE_PROVIDER_SITE_OTHER): Payer: Medicare Other | Admitting: Internal Medicine

## 2013-09-20 VITALS — BP 156/72 | HR 70 | Ht 60.0 in | Wt 189.0 lb

## 2013-09-20 DIAGNOSIS — G4733 Obstructive sleep apnea (adult) (pediatric): Secondary | ICD-10-CM

## 2013-09-20 NOTE — Patient Instructions (Addendum)
Order- Schedule split protocol NPSG   Dx OSA  We will want to see you back 30-90 days after you get your new CPAP machine.

## 2013-09-20 NOTE — Telephone Encounter (Signed)
Please advise thanks.

## 2013-09-20 NOTE — Telephone Encounter (Signed)
I have the form; I spoke with [facility]- patients last sleep study done 1999 and this is too old for Medicare. I will need to set up patient with OV to discuss her sleep issues and need for new sleep study(based on Medicare guidelines) then order sleep study.

## 2013-09-20 NOTE — Progress Notes (Signed)
08/29/13- [age] yoF never smoker referred courtesy of Dr Benetta SparVictoria [name]-[facility]; CY started patient about 15 years ago on CPAP-DME is [facility]. Now here for sleep medicine consult. We are requesting old records seeking original sleep study.  Old CPAP machine began smoking. Unable to sleep without it. Bedtime 10PM, latency 30-45 minutes, up 9AM. Weight gain 8 lbs in last 2 years. No ENT surgery, lung or heart disease. Treated hypertension.   09/20/13- 73 yoF never smoker followed for obstructive sleep apnea, complicated by GERDis FOLLOWS FOR: pt feels like she doesn't get enough air through her cpap.  Wears cpap 13/ [facility] about 8 hours nightly.  Does not breathe well lying down.  Needs a new sleep study per Medicare standards.   ROS-see HPI Constitutional:   No-   weight loss, night sweats, fevers, chills, +fatigue, lassitude. HEENT:   + headaches, No-difficulty swallowing, tooth/dental problems, sore throat,       No-  sneezing, itching, ear ache, nasal congestion, post nasal drip,  CV:  +chest pain, orthopnea, PND, swelling in lower extremities, anasarca,                                                     dizziness, +palpitations Resp: +shortness of breath with exertion or at rest.              No-   productive cough,  No non-productive cough,  No- coughing up of blood.              No-   change in color of mucus.  No- wheezing.   Skin: No-   rash or lesions. GI:  No-   heartburn, +indigestion, abdominal pain, nausea, vomiting,  GU:  MS:  No-   joint pain or swelling.   Neuro-     nothing unusual Psych:  No- change in mood or affect. + depression or anxiety.  No memory loss.  OBJ- Physical Exam General- Alert, Oriented, Affect-appropriate, Distress- none acute, overweight Skin- rash-none, lesions- none, excoriation- none Lymphadenopathy- none Head- atraumatic            Eyes- Gross vision intact, PERRLA, conjunctivae and secretions clear            Ears- +L hearing  aid            Nose- Clear, no-Septal dev, mucus, polyps, erosion, perforation             Throat- Mallampati II-III , mucosa clear , drainage- none, tonsils- atrophic Neck- flexible , trachea midline, no stridor , thyroid nl, carotid no bruit Chest - symmetrical excursion , unlabored           Heart/CV- RRR , no murmur , no gallop  , no rub, nl s1 s2                           - JVD- none , edema- none, stasis changes- none, varices- none           Lung- clear to P&A, wheeze- none, cough- none , dullness-none, rub- none           Chest wall-  Abd-  Br/ Gen/ Rectal- Not done, not indicated Extrem- cyanosis- none, clubbing, none, atrophy- none, strength- nl Neuro- grossly intact to observation

## 2013-09-20 NOTE — Telephone Encounter (Signed)
Spoke with patient-she is aware of guidelines from Medicare and will be here today for 3:45pm appointment with CY to discuss her sleep and order new sleep study. Will order new CPAP through [facility] once guidelines have been met. Nothing further needed at this time.

## 2013-09-28 ENCOUNTER — Telehealth: Payer: Self-pay | Admitting: Internal Medicine

## 2013-09-28 NOTE — Telephone Encounter (Signed)
Pt called back. She reports [facility] called her and advised her she could pick up CPAP tomorrow AM Pt is scheduled for sleep study 11/02/13. She is confused on why she needs to have sleep study then. I advised her Christoper Allegrapria advised her from previous phone notes her 1999 sleep study was too old per medicare and needed a new once. Will call [facility].  Called carol and reports she does need new sleep study since we do not have her 1999 study. She is going to figure out why pt is being called to get set up with CPAP tomorrow. Will call us back

## 2013-09-28 NOTE — Telephone Encounter (Addendum)
Call pt. Phone note working properly. She will call us back

## 2013-09-29 NOTE — Telephone Encounter (Signed)
Called spoke w/ Okey Regalarol. They were able to get a copy of baseline study. She is getting set up CPAP today. Per Okey Regalarol pt does not need a sleep study. I called and made pt aware. She r/s her appt with [facility] and sleep study cancelled.

## 2013-09-29 NOTE — Telephone Encounter (Signed)
Okey RegalCarol from [facility] returning call cn b reached @ [phone].Caren GriffinsStanley A Dalton

## 2013-10-12 ENCOUNTER — Ambulatory Visit: Payer: Medicare Other | Admitting: Internal Medicine

## 2013-10-30 NOTE — Assessment & Plan Note (Signed)
Needs new sleep study for documentation to support replacement of CPAP. Weight loss would help as discussed Plan split protocol sleep study. We can order a replacement machine based on that.

## 2013-11-02 ENCOUNTER — Encounter (HOSPITAL_BASED_OUTPATIENT_CLINIC_OR_DEPARTMENT_OTHER): Payer: Medicare Other

## 2013-11-14 ENCOUNTER — Encounter: Payer: Self-pay | Admitting: Internal Medicine

## 2013-11-14 ENCOUNTER — Ambulatory Visit (INDEPENDENT_AMBULATORY_CARE_PROVIDER_SITE_OTHER): Payer: Medicare Other | Admitting: Internal Medicine

## 2013-11-14 VITALS — BP 138/58 | HR 71 | Ht 60.0 in | Wt 190.0 lb

## 2013-11-14 DIAGNOSIS — G4733 Obstructive sleep apnea (adult) (pediatric): Secondary | ICD-10-CM

## 2013-11-14 DIAGNOSIS — K219 Gastro-esophageal reflux disease without esophagitis: Secondary | ICD-10-CM

## 2013-11-14 NOTE — Patient Instructions (Signed)
We cn continue CPAP 13/ [facility]  Please call as needed

## 2013-11-14 NOTE — Progress Notes (Signed)
08/29/13- [age] yoF never smoker referred courtesy of Dr Benetta SparVictoria Rankin-[facility]; CY started patient about 15 years ago on CPAP-DME is [facility]. Now here for sleep medicine consult. We are requesting old records seeking original sleep study.  Old CPAP machine began smoking. Unable to sleep without it. Bedtime 10PM, latency 30-45 minutes, up 9AM. Weight gain 8 lbs in last 2 years. No ENT surgery, lung or heart disease. Treated hypertension.   09/20/13- 73 yoF never smoker followed for obstructive sleep apnea, complicated by GERD FOLLOWS FOR: pt feels like she doesn't get enough air through her cpap.  Wears cpap 13/ [facility] about 8 hours nightly.  Does not breathe well lying down.  Needs a new sleep study per Medicare standards.  11/14/13- 73 yoF never smoker followed for obstructive sleep apnea, complicated by GERD FOLLOWS ZOX:WRUEAFOR:Wears CPAP 13/ [facility] every night for 8-9 about hours; wears if napping as well.  Was able to get replacement machine. Feels better and sleeps well. Using a nasal mask.  ROS-see HPI Constitutional:   No-   weight loss, night sweats, fevers, chills, fatigue, lassitude. HEENT:   + headaches, No-difficulty swallowing, tooth/dental problems, sore throat,       No-  sneezing, itching, ear ache, nasal congestion, post nasal drip,  CV:  +chest pain, orthopnea, PND, swelling in lower extremities, anasarca,                                                     dizziness, +palpitations Resp: +shortness of breath with exertion or at rest.              No-   productive cough,  No non-productive cough,  No- coughing up of blood.              No-   change in color of mucus.  No- wheezing.   Skin: No-   rash or lesions. GI:  No-   heartburn, +indigestion, abdominal pain, nausea, vomiting,  GU:  MS:  No-   joint pain or swelling.   Neuro-     nothing unusual Psych:  No- change in mood or affect. + depression or anxiety.  No memory loss.  OBJ- Physical Exam General-  Alert, Oriented, Affect-appropriate, Distress- none acute, overweight Skin- rash-none, lesions- none, excoriation- none Lymphadenopathy- none Head- atraumatic            Eyes- Gross vision intact, PERRLA, conjunctivae and secretions clear            Ears- +L hearing aid            Nose- Clear, no-Septal dev, mucus, polyps, erosion, perforation             Throat- Mallampati II-III , mucosa clear , drainage- none, tonsils- atrophic Neck- flexible , trachea midline, no stridor , thyroid nl, carotid no bruit Chest - symmetrical excursion , unlabored           Heart/CV- RRR , no murmur , no gallop  , no rub, nl s1 s2                           - JVD- none , edema- none, stasis changes- none, varices- none           Lung- clear to P&A,  wheeze- none, cough- none , dullness-none, rub- none           Chest wall-  Abd-  Br/ Gen/ Rectal- Not done, not indicated Extrem- cyanosis- none, clubbing, none, atrophy- none, strength- nl Neuro- grossly intact to observation

## 2013-12-09 ENCOUNTER — Encounter: Payer: Self-pay | Admitting: Internal Medicine

## 2013-12-09 ENCOUNTER — Ambulatory Visit (INDEPENDENT_AMBULATORY_CARE_PROVIDER_SITE_OTHER): Payer: Medicare Other | Admitting: Internal Medicine

## 2013-12-09 LAB — VITAMIN D 25 HYDROXY (VIT D DEFICIENCY, FRACTURES): VITD: 50.59 ng/mL (ref 30.00–100.00)

## 2013-12-09 LAB — PHOSPHORUS: Phosphorus: 3.2 mg/dL (ref 2.3–4.6)

## 2013-12-09 NOTE — Progress Notes (Addendum)
Patient ID: Kimberly [name], female   DOB: 09/08/1940, 73 y.o.   MRN: [id number]   HPI  Kimberly [name] is a 73 y.o.-year-old female, referred by her PCP, Kimberly. Radene [name], in consultation for hypercalcemia.  Pt was dx with hypercalcemia in 2012.   I reviewed pt's pertinent labs.  09/12/2013: Ca 10.8, iCa 5.7 (4.5-5.6) Lab Results  Component Value Date   PTH 14.9 08/24/2012   CALCIUM 10.7* 08/24/2012   CALCIUM 11.1* 08/20/2012   CALCIUM 11.1* 10/17/2011   CALCIUM 10.6* 07/26/2010   CALCIUM 10.1 08/11/2007   CALCIUM 10.4 11/11/2006   No osteoporosis, no fractures. I reviewed pt's previous DEXA scan - 2008: LUMBAR SPINE (L1-L3)  BMD: 1.273  T-score (% of young adult value): 2.3  Z-score (% adult age match value): 4.1  LEFT HIP (NECK)  BMD: 0.831  T-score (% of young adult value): -0.2  Z-score (% adult age match value): 1.4  Assessment: This patient is considered normal according to [facility]) criteria.   No h/o kidney stones.  No h/o CKD. Last BUN/Cr: Lab Results  Component Value Date   BUN 21 08/20/2012   CREATININE 0.78 08/20/2012   Pt is not on HCTZ, but was on it in the past. Now on Lasix.  No h/o vitamin D deficiency. No vitamin D levels available for review.  Pt was on vitamin D 5000 IU daily - she stopped as advised by Kimberly Kimberly [name] in 10/2013; she does not eat dairy; no green, leafy, vegetables. "I am a meat-potato eater".  No h/o cancer.  Pt does not have a FH of hypercalcemia except in her sister just before she died of colon cancer;pituitary tumors; thyroid cancer; or osteoporosis.   I reviewed her chart and she also has a history of HTN; HL; DM2 (controlled), A1c 6.2%; CKD - sees Kimberly [name].  ROS: Constitutional: + weight gain, + fatigue, no subjective hyperthermia/hypothermia, + poor sleep, + excessive urination Eyes:+  blurry vision, no xerophthalmia ENT: no sore throat, no nodules palpated in throat, no dysphagia/odynophagia, no hoarseness; +  hypoacusis Cardiovascular: no CP/SOB/+ palpitations/+ leg swelling Respiratory: no cough/SOB Gastrointestinal: no N/V/+ D/+ C Musculoskeletal: no muscle/joint aches Skin: no rashes Neurological: no tremors/numbness/tingling/dizziness, + HA Psychiatric:+ depression/no anxiety  Past Medical History  Diagnosis Date  . Hypertension   . Acid reflux   . HOH (hard of hearing)     wears hearing aid in left ear  . Sleep apnea     CPAP at bedtime  . Shortness of breath     SOB if laid flat  . PONV (postoperative nausea and vomiting)   . Heart murmur   . Frequency of urination   . Hypercholesteremia   . Anxiety   . Mental disorder   . Depression   . Arthritis   . Insomnia   . Back spasm   . Fuch's endothelial dystrophy     bilateral eyes   Past Surgical History  Procedure Laterality Date  . Cesarean section      x 2  . Cholecystectomy    . Abdominal hysterectomy    . Appendectomy    . Rotator cuff repair Left   . Eye surgery Right     implant  . Colonoscopy    . Cardiac catheterization      no PCI  . Shoulder arthroscopy with subacromial decompression and bicep tendon repair Left 08/24/2012    Procedure: SHOULDER ARTHROSCOPY WITH SUBACROMIAL DECOMPRESSION AND BICEP TENDON REPAIR;  Surgeon: Kimberly Cruise  Alphonzo Severance, MD;  Location: [facility];  Service: Orthopedics;  Laterality: Left;  Left Shoulder Arthroscopy, Debridement, Biceps Tenotomy   History   Social History  . Marital Status: Married    Spouse Name: N/A    Number of Children: 2   Occupational History  . retired-assistant teacher    Social History Main Topics  . Smoking status: Never Smoker   . Smokeless tobacco: Not on file  . Alcohol Use: No  . Drug Use: No   Current Outpatient Prescriptions on File Prior to Visit  Medication Sig Dispense Refill  . amLODipine (NORVASC) 10 MG tablet Take 20 mg by mouth daily.      Marland Kitchen aspirin 81 MG chewable tablet Chew 81 mg by mouth daily.      Marland Kitchen atorvastatin (LIPITOR) 40 MG tablet  Take 40 mg by mouth daily.      . fenofibrate micronized (LOFIBRA) 200 MG capsule Take 200 mg by mouth daily before breakfast.      . KRILL OIL PO Take 1 capsule by mouth daily.      Marland Kitchen lisinopril (PRINIVIL,ZESTRIL) 20 MG tablet Take 20 mg by mouth daily.      . nitroGLYCERIN (NITROSTAT) 0.4 MG SL tablet Place 0.4 mg under the tongue every 5 (five) minutes as needed for chest pain.       Marland Kitchen PARoxetine (PAXIL) 20 MG tablet Take 60 mg by mouth every morning.      . zolpidem (AMBIEN) 10 MG tablet Take 5 mg by mouth at bedtime as needed. For sleep       No current facility-administered medications on file prior to visit.   Allergies  Allergen Reactions  . Percocet [Oxycodone-Acetaminophen] Shortness Of Breath and Other (See Comments)    Pt couldn't breathe or catch her breath  . Codeine Other (See Comments)    Jittery. Pt stated "I climbed the wall, I just couldn't be still."  . Other Other (See Comments)    Some pain medication given at [facility] for surgery caused hallucinations   . Phenergan [Promethazine Hcl] Other (See Comments)    Hallucinations    Family History  Problem Relation Age of Onset  . Stomach cancer Mother   . Cancer Sister     intestinal   PE: BP 132/68  Pulse 87  Temp(Src) 98.9 F (37.2 C) (Oral)  Resp 12  Ht 5' (1.524 m)  Wt 186 lb (84.369 kg)  BMI 36.33 kg/m2  SpO2 97% Wt Readings from Last 3 Encounters:  12/09/13 186 lb (84.369 kg)  11/14/13 190 lb (86.183 kg)  09/20/13 189 lb (85.73 kg)   Constitutional: overweight, in NAD. No kyphosis. Eyes: PERRLA, EOMI, no exophthalmos ENT: moist mucous membranes, no thyromegaly, no cervical lymphadenopathy Cardiovascular: RRR, No MRG Respiratory: CTA B Gastrointestinal: abdomen soft, NT, ND, BS+ Musculoskeletal: no deformities, strength intact in all 4 Skin: moist, warm, no rashes Neurological: no tremor with outstretched hands, DTR normal in all 4  Assessment: 1. Hypercalcemia - no  hyperparathyroidism  Plan: Patient has had several instances of elevated calcium, with the highest level being at 11.1. Of note, her PTH was checked when her calcium was 10.8 and it was appropriately suppressed in the setting of her hypercalcemia, ruling out a parathyroid dysfunction.  - It is unclear whether she has vitamin D deficiency. She stopped her supplement (5000 IU daily) 1 mo ago. - No apparent complications from hypercalcemia: no h/o nephrolithiasis, no abdominal pain, bone pain, but has depression. - I  discussed with the patient about the physiology of calcium and parathyroid hormone, and possible side effects from increased calcium, including kidney stones, constipation, abdominal pain, etc. I explained that her parathyroid glans were appropriately suppressed in the face of a high calcium in blood >> I do not suspect a parathyroid adenoma. However, we need to determine why she has the high calcium. The first thing that I would like to check is a PTH rp. I will also check: calcium level intact PTH ([facility]) Phosphorus vitamin D- 25 HO and 1,25 HO *24h urinary calcium/creatinine ratio - if vit D normal  *We may need multiple myeloma workup, vitamin A level, bone scan - if the above are negative  - I will wait for the results of the above labs and will discuss with the plan with the patient.  - I will see her back in 4 mo  See labs below. PTH and rPTH low. Vit D normal. Phos normal. Vit D 1,25 a little high. Will check 24h urine calcium.  December 16, 2013   Kimberly [name] [address] [place] [address]   Dear Ms. Schwarting,  Below are the results from your recent visit:  VITAMIN D 25 HYDROXY      Result Value Ref Range   VITD 50.59  30.00 - 100.00 ng/mL  PHOSPHORUS      Result Value Ref Range   Phosphorus 3.2  2.3 - 4.6 mg/dL  PTH, INTACT AND CALCIUM      Result Value Ref Range   Calcium 10.9 (*) 8.7 - 10.3 mg/dL   PTH 17  15 - 65 pg/mL   PTH Comment     Narrative:     Performed at:  [facility] [address] [place], [place]  [id number] Lab Director: Lindon Romp MD, Phone:  [phone]  VITAMIN D 1,25 DIHYDROXY      Result Value Ref Range   Vitamin D 1, 25 (OH)2 Total 79 (*) 18 - 72 pg/mL   Vitamin D3 1, 25 (OH)2 79     Vitamin D2 1, 25 (OH)2 <8     Narrative:    Performed at:  [facility]                [address]                [place], [place] [address]  PTH-RELATED PEPTIDE      Result Value Ref Range   PTH-Related Protein (PTH-RP) 16  14 - 27 pg/mL   Narrative:    Performed at:  [place] [place] [address]   The above results are normal, except for the calcium level, which remains high. I would suggest to check the calcium level in a 24 hour urine collection. Please come by our lab to pick up the urine container. Please see below how to collect the urine.  Patient information (Up-to-Date): Collection of a 24-hour urine specimen   - You should collect every drop of urine during each 24-hour period. It does not matter how much or little urine is passed each time, as long as every drop is collected. - Begin the urine collection in the morning after you wake up, after you have emptied your bladder for the first time. -  Urinate (empty the bladder) for the first time and flush it down the toilet. Note the exact time (eg, 6:15 AM). You will begin the urine collection at this time. - Collect every drop of urine during the day and night in an empty collection bottle. Store the bottle at room temperature or in the refrigerator. - If you need to have a bowel movement, any urine passed with the bowel movement should be collected. Try not to include feces with the urine collection. If feces does get mixed in, do not try to remove the feces from the urine collection bottle. - Finish by collecting the first urine passed the next morning, adding it to the  collection bottle. This should be within ten minutes before or after the time of the first morning void on the first day (which was flushed). In this example, you would try to void between 6:05 and 6:25 on the second day. - If you need to urinate one hour before the final collection time, drink a full glass of water so that you can void again at the appropriate time. If you have to urinate 20 minutes before, try to hold the urine until the proper time. - Please note the exact time of the final collection, even if it is not the same time as when collection began on day 1. - The bottle(s) may be kept at room temperature for a day or two, but should be kept cool or refrigerated for longer periods of time.  If you have any questions or concerns, please don't hesitate to call.  Sincerely,   Philemon Kingdom, MD   Will addend the results when they become available.  01/23/2014 Component     Latest Ref Rng 01/20/2014  Calcium, Ur      14  Calcium, 24 hour urine     100 - 250 mg/day 322 (H)  Creatinine, Urine      51.9  Creatinine, 24H Ur     700 - 1800 mg/day 1194   Urine calcium not low. It is high, close to 4 mg/kg/day (336 mg/day). At next visit we may need may need multiple myeloma workup, vitamin A level, cortisol level or Dexamethasone suppression test, bone scan.

## 2013-12-09 NOTE — Patient Instructions (Addendum)
Please stop at the lab. Please make sure you get at last 500 mg calcium a day from diet. We will schedule an appointment if the labs return abnormal.  Hypercalcemia Hypercalcemia means the calcium in your blood is too high. Calcium in our blood is important for the control of many things, such as:  Blood clotting.  Conducting of nerve impulses.  Muscle contraction.  Maintaining teeth and bone health.  Other body functions. In the bloodstream, calcium maintains a constant balance with another mineral, phosphate. Calcium is absorbed into the body through the small intestine. This is helped by vitamin D. Calcium levels are maintained mostly by vitamin D and a hormone (parathyroid hormone). But the kidneys also help. Hypercalcemia can happen when the concentration of calcium is too high for the kidneys to maintain balance. The body maintains a balance between the calcium we eat and the calcium already in our body. If calcium intake is increased or we cannot use calcium properly, there may be problems. Some common sources of calcium are:   Dairy products.  Nuts.  Eggs.  Whole grains.  Legumes.  Green leafy vegetables. CAUSES There are many causes of this condition, but some common ones are:  Hyperparathyroidism. This is an overactivity of the parathyroid gland.  Cancers of the breast, kidney, lung, head, and neck are common causes of calcium increases.  Medications that cause you to urinate more often (diuretics), nausea, vomiting, and diarrhea also increase the calcium in the blood.  Overuse of calcium-containing antacids. SYMPTOMS  Many patients with mild hypercalcemia have no symptoms. For those with symptoms, common problems include:  Loss of appetite.  Constipation.  Increased thirst.  Heart rhythm changes.  Abnormal thinking.  Nausea.  Abdominal pain.  Kidney stones.  Mood swings.  Coma and death when severe.  Vomiting.  Increased urination.  High  blood pressure.  Confusion. DIAGNOSIS   Your caregiver will do a medical history and perform a physical exam on you.  Calcium and parathyroid hormone (PTH) may be measured with a blood test. TREATMENT   The treatment depends on the calcium level and what is causing the higher level. Hypercalcemia can be life threatening. Fast lowering of the calcium level may be necessary.  With normal kidney function, fluids can be given by vein to clear the excess calcium. Hemodialysis works well to reduce dangerous calcium levels if there is poor kidney function. This is a procedure in which a machine is used to filter out unwanted substances. The blood is then returned to the body.  Drugs, such as diuretics, can be given after adequate fluid intake is established. These medications help the kidneys get rid of extra calcium. Drugs that lessen (inhibit) bone loss are helpful in gaining long-term control. Phosphate pills help lower high calcium levels caused by a low supply of phosphate. Anti-inflammatory agents such as steroids are helpful with some cancers and toxic levels of vitamin D.  Treatment of the underlying cause of the hypercalcemia will also correct the imbalance. Hyperparathyroidism is usually treated by surgical removal of one or more of the parathyroid glands and any tissue, other than the glands themselves, that is producing too much hormone.  The hypercalcemia caused by cancer is difficult to treat without controlling the cancer. Symptoms can be improved with fluids and drug therapy as outlined above. PROGNOSIS   Surgery to remove the parathyroid glands is usually successful. This also depends on the amount of damage to the kidneys and whether or not it can be  treated.  Mild hypercalcemia can be controlled with good fluid intake and the use of effective medications.  Hypercalcemia often develops as a late complication of cancer. The expected outlook is poor without effective anticancer  therapy. PREVENTION   If you are at risk for developing hypercalcemia, be familiar with early symptoms. Report these to your caregiver.  Good fluid intake (up to four quarts of liquid a day if possible) is helpful.  Try to control nausea and vomiting, and treat fevers to avoid dehydration.  Lowering the amount of calcium in your diet is not necessary. High blood calcium reduces absorption of calcium in the intestine.  Stay as active as possible. SEEK IMMEDIATE MEDICAL CARE IF:   You develop chest pain, sweating, or shortness of breath.  You get confused, feel faint or pass out.  You develop severe nausea and vomiting. MAKE SURE YOU:   Understand these instructions.  Will watch your condition.  Will get help right away if you are not doing well or get worse. Document Released: 07/12/2004 Document Revised: 09/12/2013 Document Reviewed: 04/23/2010 [facility] Patient Information 2015 [place], [place]. This information is not intended to replace advice given to you by your health care provider. Make sure you discuss any questions you have with your health care provider.

## 2013-12-10 LAB — PTH, INTACT AND CALCIUM
Calcium: 10.9 mg/dL — ABNORMAL HIGH (ref 8.7–10.3)
PTH: 17 pg/mL (ref 15–65)

## 2013-12-15 LAB — VITAMIN D 1,25 DIHYDROXY
VITAMIN D 1, 25 (OH) TOTAL: 79 pg/mL — AB (ref 18–72)
VITAMIN D3 1, 25 (OH): 79 pg/mL

## 2013-12-16 ENCOUNTER — Encounter: Payer: Self-pay | Admitting: Internal Medicine

## 2013-12-16 LAB — PTH-RELATED PEPTIDE: PTH-Related Protein (PTH-RP): 16 pg/mL (ref 14–27)

## 2014-01-10 ENCOUNTER — Other Ambulatory Visit: Payer: Medicare Other

## 2014-01-20 ENCOUNTER — Other Ambulatory Visit: Payer: Medicare Other

## 2014-01-21 LAB — CREATININE, URINE, 24 HOUR
Creatinine, 24H Ur: 1194 mg/d (ref 700–1800)
Creatinine, Urine: 51.9 mg/dL

## 2014-01-21 LAB — CALCIUM, URINE, 24 HOUR
Calcium, 24 hour urine: 322 mg/d — ABNORMAL HIGH (ref 100–250)
Calcium, Ur: 14 mg/dL

## 2014-01-24 ENCOUNTER — Encounter: Payer: Self-pay | Admitting: *Deleted

## 2014-02-26 NOTE — Assessment & Plan Note (Signed)
Reviewed reflux precautions 

## 2014-02-26 NOTE — Assessment & Plan Note (Signed)
Good compliance and control with CPAP 13/[facility]. We discussed goals and comfort measures. Plan-no changes.

## 2014-04-03 ENCOUNTER — Encounter: Payer: Self-pay | Admitting: Internal Medicine

## 2014-04-03 NOTE — Progress Notes (Signed)
Received labs for patient from 03/28/2014: - CMP normal except calcium 10.7 (8.6-10.3). Also, glucose 229. - Hemoglobin A1c 6.4% - Lipids: 205/160/37/137

## 2014-05-17 ENCOUNTER — Encounter: Payer: Self-pay | Admitting: Internal Medicine

## 2014-05-17 ENCOUNTER — Ambulatory Visit (INDEPENDENT_AMBULATORY_CARE_PROVIDER_SITE_OTHER): Payer: Medicare Other | Admitting: Internal Medicine

## 2014-05-17 VITALS — BP 130/68 | HR 78 | Ht 60.0 in | Wt 190.6 lb

## 2014-05-17 DIAGNOSIS — G4733 Obstructive sleep apnea (adult) (pediatric): Secondary | ICD-10-CM

## 2014-05-17 DIAGNOSIS — J310 Chronic rhinitis: Secondary | ICD-10-CM

## 2014-05-17 NOTE — Progress Notes (Signed)
08/29/13- 89 yoF never smoker referred courtesy of Dr Benetta SparVictoria Rankin-[facility] allergy patient at [facility]; CY started patient about 15 years ago on CPAP-DME is [facility]. Now here for sleep medicine consult. We are requesting old records seeking original sleep study.  Old CPAP machine began smoking. Unable to sleep without it. Bedtime 10PM, latency 30-45 minutes, up 9AM. Weight gain 8 lbs in last 2 years. No ENT surgery, lung or heart disease. Treated hypertension.   09/20/13- 73 yoF never smoker followed for obstructive sleep apnea, complicated by GERD FOLLOWS FOR: pt feels like she doesn't get enough air through her cpap.  Wears cpap 13/ [facility] about 8 hours nightly.  Does not breathe well lying down.  Needs a new sleep study per Medicare standards.  11/14/13- 73 yoF never smoker followed for obstructive sleep apnea, complicated by GERD FOLLOWS ZOX:WRUEAFOR:Wears CPAP 13/ [facility] every night for 8-9 about hours; wears if napping as well.  Was able to get replacement machine. Feels better and sleeps well. Using a nasal mask.  05/17/14- 73 yoF never smoker followed for obstructive sleep apnea, complicated by GERD FOLLOW FOR: OSA; wearing CPAP 13/ [facility] every night approx. 9 hours nightly; no concerns Increased sneezing recently related to dry winter air  ROS-see HPI Constitutional:   No-   weight loss, night sweats, fevers, chills, fatigue, lassitude. HEENT:   + headaches, No-difficulty swallowing, tooth/dental problems, sore throat,       +sneezing, itching, ear ache, nasal congestion, post nasal drip,  CV:  No-chest pain, orthopnea, PND, swelling in lower extremities, anasarca,                                                     dizziness, +palpitations Resp: +shortness of breath with exertion or at rest.              No-   productive cough,  No non-productive cough,  No- coughing up of blood.              No-   change in color of mucus.  No- wheezing.   Skin: No-   rash or lesions. GI:  No-   heartburn,  +indigestion, abdominal pain, nausea, vomiting,  GU:  MS:  No-   joint pain or swelling.   Neuro-     nothing unusual Psych:  No- change in mood or affect. + depression or anxiety.  No memory loss.  OBJ- Physical Exam General- Alert, Oriented, Affect-appropriate, Distress- none acute, overweight Skin- rash-none, lesions- none, excoriation- none Lymphadenopathy- none Head- atraumatic            Eyes- Gross vision intact, PERRLA, conjunctivae and secretions clear            Ears- +L hearing aid            Nose- Clear, no-Septal dev, mucus, polyps, erosion, perforation             Throat- Mallampati II-III , mucosa clear , drainage- none, tonsils- atrophic Neck- flexible , trachea midline, no stridor , thyroid nl, carotid no bruit Chest - symmetrical excursion , unlabored           Heart/CV- RRR , no murmur , no gallop  , no rub, nl s1 s2                           -  JVD- none , edema- none, stasis changes- none, varices- none           Lung- clear to P&A, wheeze- none, cough- none , dullness-none, rub- none           Chest wall-  Abd-  Br/ Gen/ Rectal- Not done, not indicated Extrem- cyanosis- none, clubbing, none, atrophy- none, strength- nl Neuro- grossly intact to observation

## 2014-05-17 NOTE — Patient Instructions (Signed)
We can continue CPAP 13/ [facility]  Consider trying an otc saline (salt water) nasal spray as needed for dry nose  Please call if we can help

## 2014-07-02 DIAGNOSIS — J310 Chronic rhinitis: Secondary | ICD-10-CM | POA: Insufficient documentation

## 2014-07-02 NOTE — Assessment & Plan Note (Signed)
Good compliance and control with CPAP 13/[facility]. Medically necessary

## 2014-07-02 NOTE — Assessment & Plan Note (Signed)
Dry nose from indoor heat. We discussed nasal saline spray and gel, adjustment of CPAP humidifier

## 2014-08-24 ENCOUNTER — Encounter (HOSPITAL_COMMUNITY): Payer: Self-pay | Admitting: *Deleted

## 2014-08-24 ENCOUNTER — Emergency Department (HOSPITAL_COMMUNITY): Payer: Medicare Other

## 2014-08-24 ENCOUNTER — Inpatient Hospital Stay (HOSPITAL_COMMUNITY)
Admission: EM | Admit: 2014-08-24 | Discharge: 2014-08-26 | DRG: 293 | Disposition: A | Discharge status: Home / Self Care | Payer: Medicare Other | Attending: Internal Medicine | Admitting: Internal Medicine

## 2014-08-24 DIAGNOSIS — G4733 Obstructive sleep apnea (adult) (pediatric): Secondary | ICD-10-CM | POA: Diagnosis present

## 2014-08-24 DIAGNOSIS — I5030 Unspecified diastolic (congestive) heart failure: Secondary | ICD-10-CM | POA: Diagnosis present

## 2014-08-24 DIAGNOSIS — R519 Headache, unspecified: Secondary | ICD-10-CM

## 2014-08-24 DIAGNOSIS — G4700 Insomnia, unspecified: Secondary | ICD-10-CM | POA: Diagnosis present

## 2014-08-24 DIAGNOSIS — Z885 Allergy status to narcotic agent status: Secondary | ICD-10-CM

## 2014-08-24 DIAGNOSIS — R51 Headache: Secondary | ICD-10-CM

## 2014-08-24 DIAGNOSIS — F329 Major depressive disorder, single episode, unspecified: Secondary | ICD-10-CM | POA: Diagnosis present

## 2014-08-24 DIAGNOSIS — K219 Gastro-esophageal reflux disease without esophagitis: Secondary | ICD-10-CM | POA: Diagnosis present

## 2014-08-24 DIAGNOSIS — M1990 Unspecified osteoarthritis, unspecified site: Secondary | ICD-10-CM | POA: Diagnosis present

## 2014-08-24 DIAGNOSIS — F419 Anxiety disorder, unspecified: Secondary | ICD-10-CM | POA: Diagnosis present

## 2014-08-24 DIAGNOSIS — I10 Essential (primary) hypertension: Secondary | ICD-10-CM | POA: Diagnosis not present

## 2014-08-24 DIAGNOSIS — Z90710 Acquired absence of both cervix and uterus: Secondary | ICD-10-CM

## 2014-08-24 DIAGNOSIS — I110 Hypertensive heart disease with heart failure: Principal | ICD-10-CM | POA: Diagnosis present

## 2014-08-24 DIAGNOSIS — Z888 Allergy status to other drugs, medicaments and biological substances status: Secondary | ICD-10-CM

## 2014-08-24 DIAGNOSIS — Z9049 Acquired absence of other specified parts of digestive tract: Secondary | ICD-10-CM | POA: Diagnosis present

## 2014-08-24 DIAGNOSIS — H9190 Unspecified hearing loss, unspecified ear: Secondary | ICD-10-CM | POA: Diagnosis present

## 2014-08-24 DIAGNOSIS — E780 Pure hypercholesterolemia: Secondary | ICD-10-CM | POA: Diagnosis present

## 2014-08-24 LAB — COMPREHENSIVE METABOLIC PANEL
ALK PHOS: 42 U/L (ref 39–117)
ALT: 23 U/L (ref 0–35)
AST: 31 U/L (ref 0–37)
Albumin: 4.6 g/dL (ref 3.5–5.2)
Anion gap: 14 (ref 5–15)
BUN: 14 mg/dL (ref 6–23)
CALCIUM: 10.3 mg/dL (ref 8.4–10.5)
CO2: 24 mmol/L (ref 19–32)
CREATININE: 0.86 mg/dL (ref 0.50–1.10)
Chloride: 102 mmol/L (ref 96–112)
GFR calc non Af Amer: 65 mL/min — ABNORMAL LOW (ref >90)
GFR, EST AFRICAN AMERICAN: 76 mL/min — AB (ref >90)
GLUCOSE: 139 mg/dL — AB (ref 70–99)
Potassium: 3.4 mmol/L — ABNORMAL LOW (ref 3.5–5.1)
Sodium: 140 mmol/L (ref 135–145)
TOTAL PROTEIN: 7.5 g/dL (ref 6.0–8.3)
Total Bilirubin: 0.8 mg/dL (ref 0.3–1.2)

## 2014-08-24 LAB — CBC
HEMATOCRIT: 38.8 % (ref 36.0–46.0)
HEMOGLOBIN: 12.9 g/dL (ref 12.0–15.0)
MCH: 29.1 pg (ref 26.0–34.0)
MCHC: 33.2 g/dL (ref 30.0–36.0)
MCV: 87.6 fL (ref 78.0–100.0)
Platelets: 202 10*3/uL (ref 150–400)
RBC: 4.43 MIL/uL (ref 3.87–5.11)
RDW: 13.3 % (ref 11.5–15.5)
WBC: 7.2 10*3/uL (ref 4.0–10.5)

## 2014-08-24 LAB — I-STAT TROPONIN, ED: TROPONIN I, POC: 0 ng/mL (ref 0.00–0.08)

## 2014-08-24 MED ORDER — NICARDIPINE HCL IN NACL 20-0.86 MG/200ML-% IV SOLN
3.0000 mg/h | Freq: Once | INTRAVENOUS | Status: AC
Start: 2014-08-24 — End: 2014-08-24
  Administered 2014-08-24: 5 mg/h via INTRAVENOUS
  Filled 2014-08-24: qty 200

## 2014-08-24 MED ORDER — KETOROLAC TROMETHAMINE 30 MG/ML IJ SOLN
30.0000 mg | Freq: Once | INTRAMUSCULAR | Status: AC
  Administered 2014-08-24: 30 mg via INTRAVENOUS

## 2014-08-24 MED ORDER — ONDANSETRON HCL 4 MG/2ML IJ SOLN
4.0000 mg | Freq: Once | INTRAMUSCULAR | Status: AC
  Administered 2014-08-24: 4 mg via INTRAVENOUS
  Filled 2014-08-24: qty 2

## 2014-08-24 MED ORDER — ENALAPRILAT 1.25 MG/ML IV SOLN
1.2500 mg | Freq: Once | INTRAVENOUS | Status: AC
  Administered 2014-08-24: 1.25 mg via INTRAVENOUS
  Filled 2014-08-24: qty 1

## 2014-08-24 MED ORDER — FENTANYL CITRATE (PF) 100 MCG/2ML IJ SOLN
50.0000 ug | Freq: Once | INTRAMUSCULAR | Status: AC
  Administered 2014-08-24: 50 ug via INTRAVENOUS
  Filled 2014-08-24: qty 2

## 2014-08-24 NOTE — ED Provider Notes (Signed)
CSN: [id number]     Arrival date & time 08/24/14  2049 History   First MD Initiated Contact with Patient 08/24/14 2115     Chief Complaint  Patient presents with  . Hypertension  . Headache  . Nausea  . Emesis     (Consider location/radiation/quality/duration/timing/severity/associated sxs/prior Treatment) HPI Comments: The pt is a 74 y/o female with hx of Htn, she has been taking her lisinopril and norvasc but over the last 8 days, she has been having ongoing headache described as diffuse, tight and squeezing and today has been associated with multiple episodes of vomiting today.  She has vomitted up her BP meds today.  She does not usually have headaches - this has bene severe - the husband states that today they buried his brother and he thinks that the funeral this week has effected her as well.  She has not had head injuries and does take a baby aspirin daily - no other anticoagulants.  Normal gait and speech today.  Denies weaknesss or numbness. No meds for ha prior to arrival - noted to have BP of 240/96 on exam.  Patient is a 74 y.o. female presenting with hypertension, headaches, and vomiting. The history is provided by the patient.  Hypertension Associated symptoms include headaches.  Headache Associated symptoms: vomiting   Emesis Associated symptoms: headaches     Past Medical History  Diagnosis Date  . Hypertension   . Acid reflux   . HOH (hard of hearing)     wears hearing aid in left ear  . Sleep apnea     CPAP at bedtime  . Shortness of breath     SOB if laid flat  . PONV (postoperative nausea and vomiting)   . Heart murmur   . Frequency of urination   . Hypercholesteremia   . Anxiety   . Mental disorder   . Depression   . Arthritis   . Insomnia   . Back spasm   . Fuch's endothelial dystrophy     bilateral eyes   Past Surgical History  Procedure Laterality Date  . Cesarean section      x 2  . Cholecystectomy    . Abdominal hysterectomy    .  Appendectomy    . Rotator cuff repair Left   . Eye surgery Right     implant  . Colonoscopy    . Cardiac catheterization      no PCI  . Shoulder arthroscopy with subacromial decompression and bicep tendon repair Left 08/24/2012    Procedure: SHOULDER ARTHROSCOPY WITH SUBACROMIAL DECOMPRESSION AND BICEP TENDON REPAIR;  Surgeon: Cammy Copa, MD;  Location: [facility] OR;  Service: Orthopedics;  Laterality: Left;  Left Shoulder Arthroscopy, Debridement, Biceps Tenotomy   Family History  Problem Relation Age of Onset  . Stomach cancer Mother   . Cancer Sister     intestinal   History  Substance Use Topics  . Smoking status: Never Smoker   . Smokeless tobacco: Not on file  . Alcohol Use: No   OB History    No data available     Review of Systems  Gastrointestinal: Positive for vomiting.  Neurological: Positive for headaches.  All other systems reviewed and are negative.     Allergies  Percocet; Codeine; Other; and Phenergan  Home Medications   Prior to Admission medications   Medication Sig Start Date End Date Taking? Authorizing Provider  amLODipine (NORVASC) 10 MG tablet Take 20 mg by mouth daily.   Yes  Historical Provider, MD  aspirin 81 MG chewable tablet Chew 81 mg by mouth daily.   Yes Historical Provider, MD  atorvastatin (LIPITOR) 40 MG tablet Take 40 mg by mouth daily.   Yes Historical Provider, MD  fenofibrate micronized (LOFIBRA) 200 MG capsule Take 200 mg by mouth daily before breakfast.   Yes Historical Provider, MD  furosemide (LASIX) 20 MG tablet Take 20 mg by mouth 2 (two) times daily. 07/13/14  Yes Historical Provider, MD  KRILL OIL PO Take 1 capsule by mouth daily.   Yes Historical Provider, MD  lisinopril (PRINIVIL,ZESTRIL) 20 MG tablet Take 20 mg by mouth daily.   Yes Historical Provider, MD  nitroGLYCERIN (NITROSTAT) 0.4 MG SL tablet Place 0.4 mg under the tongue every 5 (five) minutes as needed for chest pain.    Yes Historical Provider, MD  PARoxetine  (PAXIL) 20 MG tablet Take 60 mg by mouth every morning.   Yes Historical Provider, MD  traMADol (ULTRAM) 50 MG tablet Take 100 mg by mouth daily as needed for moderate pain.  07/12/14  Yes Historical Provider, MD  zolpidem (AMBIEN) 10 MG tablet Take 5 mg by mouth at bedtime. For sleep   Yes Historical Provider, MD   BP 181/52 mmHg  Pulse 61  Temp(Src) 99.2 F (37.3 C) (Oral)  Resp 28  Ht 5' (1.524 m)  Wt 181 lb 14.1 oz (82.5 kg)  BMI 35.52 kg/m2  SpO2 96% Physical Exam  Constitutional: She appears well-developed and well-nourished. No distress.  HENT:  Head: Normocephalic and atraumatic.  Mouth/Throat: Oropharynx is clear and moist. No oropharyngeal exudate.  Eyes: Conjunctivae and EOM are normal. Pupils are equal, round, and reactive to light. Right eye exhibits no discharge. Left eye exhibits no discharge. No scleral icterus.  Neck: Normal range of motion. Neck supple. No JVD present. No thyromegaly present.  Cardiovascular: Normal rate, regular rhythm, normal heart sounds and intact distal pulses.  Exam reveals no gallop and no friction rub.   No murmur heard. Pulmonary/Chest: Effort normal and breath sounds normal. No respiratory distress. She has no wheezes. She has no rales.  Abdominal: Soft. Bowel sounds are normal. She exhibits no distension and no mass. There is no tenderness.  Musculoskeletal: Normal range of motion. She exhibits no edema or tenderness.  Lymphadenopathy:    She has no cervical adenopathy.  Neurological: She is alert. Coordination normal.  Normal speech, coordination, strength and sensation in all 4 extremities.    Skin: Skin is warm and dry. No rash noted. No erythema.  Psychiatric: She has a normal mood and affect. Her behavior is normal.  Nursing note and vitals reviewed.   ED Course  Procedures (including critical care time) Labs Review Labs Reviewed  COMPREHENSIVE METABOLIC PANEL - Abnormal; Notable for the following:    Potassium 3.4 (*)     Glucose, Bld 139 (*)    GFR calc non Af Amer 65 (*)    GFR calc Af Amer 76 (*)    All other components within normal limits  COMPREHENSIVE METABOLIC PANEL - Abnormal; Notable for the following:    Glucose, Bld 134 (*)    Alkaline Phosphatase 38 (*)    GFR calc non Af Amer 69 (*)    GFR calc Af Amer 80 (*)    All other components within normal limits  TROPONIN I - Abnormal; Notable for the following:    Troponin I 0.08 (*)    All other components within normal limits  TROPONIN I -  Abnormal; Notable for the following:    Troponin I 0.08 (*)    All other components within normal limits  BRAIN NATRIURETIC PEPTIDE - Abnormal; Notable for the following:    B Natriuretic Peptide 149.5 (*)    All other components within normal limits  MRSA PCR SCREENING  CBC  CBC WITH DIFFERENTIAL/PLATELET  TSH  TROPONIN I  Rosezena Sensor, ED    Imaging Review Dg Chest 2 View  08/25/2014   CLINICAL DATA:  High blood pressure and headache  EXAM: CHEST  2 VIEW  COMPARISON:  08/20/2012  FINDINGS: There is chronic cardiomegaly and mild aortic tortuosity. Superimposed paraspinal shadow is also a chronic finding. There is no edema, consolidation, effusion, or pneumothorax.  IMPRESSION: Cardiomegaly without acute disease.   Electronically Signed   By: Marnee Spring M.D.   On: 08/25/2014 01:52   Ct Head Wo Contrast  08/24/2014   CLINICAL DATA:  Headache, nausea, vomiting  EXAM: CT HEAD WITHOUT CONTRAST  TECHNIQUE: Contiguous axial images were obtained from the base of the skull through the vertex without intravenous contrast.  COMPARISON:  None.  FINDINGS: No evidence of parenchymal hemorrhage or extra-axial fluid collection. No mass lesion, mass effect, or midline shift.  No CT evidence of acute infarction.  Mild small vessel ischemic changes.  Intracranial atherosclerosis.  Cerebral volume is within normal limits.  No ventriculomegaly.  The visualized paranasal sinuses are essentially clear. The mastoid air  cells are unopacified.  No evidence of calvarial fracture.  IMPRESSION: No evidence of acute intracranial abnormality.  Mild small vessel ischemic changes.   Electronically Signed   By: Charline Bills M.D.   On: 08/24/2014 22:22   Mr Brain Wo Contrast  08/25/2014   CLINICAL DATA:  Initial evaluation for 1 week history of intermittent headache. Progressively worsening over past 2-3 days. Elevated blood pressure.  EXAM: MRI HEAD WITHOUT CONTRAST  TECHNIQUE: Multiplanar, multiecho pulse sequences of the brain and surrounding structures were obtained without intravenous contrast.  COMPARISON:  Prior CT from 08/24/2014  FINDINGS: Mild diffuse prominence of the CSF containing spaces compatible with generalized cerebral atrophy, within normal limits for patient age. Scattered patchy and confluent T2/FLAIR hyperintensity within the periventricular and deep white matter both cerebral hemispheres noted, most likely related chronic small vessel ischemic disease, fairly mild for patient age.  No abnormal foci of restricted diffusion to suggest acute intracranial infarct. Gray-white matter differentiation maintained. Normal intravascular flow voids preserved. No acute ear chronic intracranial hemorrhage.  No mass lesion or midline shift. No hydrocephalus. No extra-axial fluid collection.  Craniocervical junction within normal limits. Pituitary gland normal.  No acute abnormality about the orbits. Prior cataract extraction noted on the right.  Paranasal sinuses are clear.  No mastoid effusion.  Bone marrow signal intensity within normal limits. No scalp soft tissue abnormality.  IMPRESSION: 1. No acute intracranial infarct or other abnormality identified. 2. Mild chronic small vessel ischemic disease involving the supratentorial white matter.   Electronically Signed   By: Rise Mu M.D.   On: 08/25/2014 02:10     EKG Interpretation   Date/Time:  Thursday August 24 2014 21:22:24 EDT Ventricular Rate:  76 PR  Interval:  170 QRS Duration: 97 QT Interval:  422 QTC Calculation: 474 R Axis:   17 Text Interpretation:  Sinus rhythm Normal ECG Since last tracing rate  slower Confirmed by Madilyn Cephas  MD, Kamarie Veno ([id number]) on 08/24/2014 9:45:05 PM      MDM   Final diagnoses:  Headache  Severe hypertension    The pt has normal cardiovascular exam, normal neurological exam but continues to have severe htn and headache.  CT head, labs, ECG  ECG normal, lab with normal CBC, troponin.   CT pending  meds as below - i placed peripheral IV with US guidance.  Emergency Ultrasound Study:   Angiocath insertion Performed by: Vida RollerMILLER,Talissa Apple D  Consent: Verbal consent obtained. Risks and benefits: risks, benefits and alternatives were discussed Immediately prior to procedure the correct patient, procedure, equipment, support staff and site/side marked as needed.  Indication: difficult IV access Preparation: Patient was prepped and draped in the usual sterile fashion. Vein Location: R AC  vein was visualized during assessment for potential access sites and was found to be patent/ easily compressed with linear ultrasound.  The needle was visualized with real-time ultrasound and guided into the vein. Gauge: 20  Image saved and stored.  Normal blood return.  Patient tolerance: Patient tolerated the procedure well with no immediate complications.   Meds given in ED:  LIsinopril was given without any signficiant change in the blood pressure - Cardene drip was added due to severe hypertension.  This improved BP to 160's - due to initial elevation, no further reduction was desired acutely - pt will be admitted to hospital.    Cardene Drip  CRITICAL CARE Performed by: Vida RollerMILLER,Juliano Mceachin D Total critical care time: 35 Critical care time was exclusive of separately billable procedures and treating other patients. Critical care was necessary to treat or prevent imminent or life-threatening deterioration. Critical care  was time spent personally by me on the following activities: development of treatment plan with patient and/or surrogate as well as nursing, discussions with consultants, evaluation of patient's response to treatment, examination of patient, obtaining history from patient or surrogate, ordering and performing treatments and interventions, ordering and review of laboratory studies, ordering and review of radiographic studies, pulse oximetry and re-evaluation of patient's condition.      Eber HongBrian Dacota Devall, MD 08/25/14 1159

## 2014-08-24 NOTE — ED Notes (Signed)
Pt reports a headache, n/v since Wednesday. Pt is very hypertensive 240/90 in both arms. Pt is on blood pressure medication, reports taking them as scheduled bu may have thrown them up when she vomited.

## 2014-08-25 ENCOUNTER — Inpatient Hospital Stay (HOSPITAL_COMMUNITY): Payer: Medicare Other

## 2014-08-25 ENCOUNTER — Encounter (HOSPITAL_COMMUNITY): Payer: Self-pay | Admitting: *Deleted

## 2014-08-25 DIAGNOSIS — Z885 Allergy status to narcotic agent status: Secondary | ICD-10-CM | POA: Diagnosis not present

## 2014-08-25 DIAGNOSIS — M1990 Unspecified osteoarthritis, unspecified site: Secondary | ICD-10-CM | POA: Diagnosis not present

## 2014-08-25 DIAGNOSIS — I5031 Acute diastolic (congestive) heart failure: Secondary | ICD-10-CM

## 2014-08-25 DIAGNOSIS — R51 Headache: Secondary | ICD-10-CM

## 2014-08-25 DIAGNOSIS — Z90710 Acquired absence of both cervix and uterus: Secondary | ICD-10-CM | POA: Diagnosis not present

## 2014-08-25 DIAGNOSIS — E780 Pure hypercholesterolemia: Secondary | ICD-10-CM | POA: Diagnosis not present

## 2014-08-25 DIAGNOSIS — I10 Essential (primary) hypertension: Secondary | ICD-10-CM | POA: Diagnosis present

## 2014-08-25 DIAGNOSIS — G4733 Obstructive sleep apnea (adult) (pediatric): Secondary | ICD-10-CM | POA: Diagnosis not present

## 2014-08-25 DIAGNOSIS — G4700 Insomnia, unspecified: Secondary | ICD-10-CM | POA: Diagnosis not present

## 2014-08-25 DIAGNOSIS — Z888 Allergy status to other drugs, medicaments and biological substances status: Secondary | ICD-10-CM | POA: Diagnosis not present

## 2014-08-25 DIAGNOSIS — K219 Gastro-esophageal reflux disease without esophagitis: Secondary | ICD-10-CM | POA: Diagnosis not present

## 2014-08-25 DIAGNOSIS — H9190 Unspecified hearing loss, unspecified ear: Secondary | ICD-10-CM | POA: Diagnosis not present

## 2014-08-25 DIAGNOSIS — R7989 Other specified abnormal findings of blood chemistry: Secondary | ICD-10-CM | POA: Diagnosis not present

## 2014-08-25 DIAGNOSIS — Z9049 Acquired absence of other specified parts of digestive tract: Secondary | ICD-10-CM | POA: Diagnosis not present

## 2014-08-25 DIAGNOSIS — F329 Major depressive disorder, single episode, unspecified: Secondary | ICD-10-CM | POA: Diagnosis not present

## 2014-08-25 DIAGNOSIS — I5030 Unspecified diastolic (congestive) heart failure: Secondary | ICD-10-CM | POA: Diagnosis not present

## 2014-08-25 DIAGNOSIS — F419 Anxiety disorder, unspecified: Secondary | ICD-10-CM | POA: Diagnosis not present

## 2014-08-25 DIAGNOSIS — I110 Hypertensive heart disease with heart failure: Secondary | ICD-10-CM | POA: Diagnosis present

## 2014-08-25 LAB — COMPREHENSIVE METABOLIC PANEL
ALT: 20 U/L (ref 0–35)
AST: 27 U/L (ref 0–37)
Albumin: 4 g/dL (ref 3.5–5.2)
Alkaline Phosphatase: 38 U/L — ABNORMAL LOW (ref 39–117)
Anion gap: 11 (ref 5–15)
BUN: 14 mg/dL (ref 6–23)
CO2: 26 mmol/L (ref 19–32)
Calcium: 10 mg/dL (ref 8.4–10.5)
Chloride: 104 mmol/L (ref 96–112)
Creatinine, Ser: 0.82 mg/dL (ref 0.50–1.10)
GFR calc Af Amer: 80 mL/min — ABNORMAL LOW (ref >90)
GFR calc non Af Amer: 69 mL/min — ABNORMAL LOW (ref >90)
GLUCOSE: 134 mg/dL — AB (ref 70–99)
Potassium: 3.6 mmol/L (ref 3.5–5.1)
SODIUM: 141 mmol/L (ref 135–145)
TOTAL PROTEIN: 6.5 g/dL (ref 6.0–8.3)
Total Bilirubin: 0.7 mg/dL (ref 0.3–1.2)

## 2014-08-25 LAB — CBC WITH DIFFERENTIAL/PLATELET
Basophils Absolute: 0 10*3/uL (ref 0.0–0.1)
Basophils Relative: 0 % (ref 0–1)
Eosinophils Absolute: 0 10*3/uL (ref 0.0–0.7)
Eosinophils Relative: 1 % (ref 0–5)
HEMATOCRIT: 37.1 % (ref 36.0–46.0)
Hemoglobin: 12.1 g/dL (ref 12.0–15.0)
LYMPHS ABS: 1.7 10*3/uL (ref 0.7–4.0)
Lymphocytes Relative: 23 % (ref 12–46)
MCH: 28.9 pg (ref 26.0–34.0)
MCHC: 32.6 g/dL (ref 30.0–36.0)
MCV: 88.8 fL (ref 78.0–100.0)
Monocytes Absolute: 0.4 10*3/uL (ref 0.1–1.0)
Monocytes Relative: 6 % (ref 3–12)
NEUTROS PCT: 70 % (ref 43–77)
Neutro Abs: 5.4 10*3/uL (ref 1.7–7.7)
Platelets: 203 10*3/uL (ref 150–400)
RBC: 4.18 MIL/uL (ref 3.87–5.11)
RDW: 13.5 % (ref 11.5–15.5)
WBC: 7.6 10*3/uL (ref 4.0–10.5)

## 2014-08-25 LAB — TROPONIN I
TROPONIN I: 0.08 ng/mL — AB (ref <0.031)
Troponin I: 0.08 ng/mL — ABNORMAL HIGH (ref <0.031)
Troponin I: 0.06 ng/mL — ABNORMAL HIGH (ref <0.031)

## 2014-08-25 LAB — TSH: TSH: 1.128 u[IU]/mL (ref 0.350–4.500)

## 2014-08-25 LAB — MRSA PCR SCREENING: MRSA by PCR: NEGATIVE

## 2014-08-25 LAB — BRAIN NATRIURETIC PEPTIDE: B Natriuretic Peptide: 149.5 pg/mL — ABNORMAL HIGH (ref 0.0–100.0)

## 2014-08-25 MED ORDER — TRAMADOL HCL 50 MG PO TABS
100.0000 mg | ORAL_TABLET | Freq: Every day | ORAL | Status: DC | PRN
  Administered 2014-08-25: 100 mg via ORAL
  Filled 2014-08-25: qty 2

## 2014-08-25 MED ORDER — HYDRALAZINE HCL 20 MG/ML IJ SOLN
10.0000 mg | INTRAMUSCULAR | Status: DC | PRN
  Administered 2014-08-25: 10 mg via INTRAVENOUS
  Filled 2014-08-25: qty 1

## 2014-08-25 MED ORDER — CARVEDILOL 3.125 MG PO TABS
3.1250 mg | ORAL_TABLET | Freq: Two times a day (BID) | ORAL | Status: DC
  Administered 2014-08-25 – 2014-08-26 (×2): 3.125 mg via ORAL
  Filled 2014-08-25 (×4): qty 1

## 2014-08-25 MED ORDER — ASPIRIN 81 MG PO CHEW
81.0000 mg | CHEWABLE_TABLET | Freq: Every day | ORAL | Status: DC
  Administered 2014-08-25 – 2014-08-26 (×2): 81 mg via ORAL
  Filled 2014-08-25 (×2): qty 1

## 2014-08-25 MED ORDER — FUROSEMIDE 20 MG PO TABS
20.0000 mg | ORAL_TABLET | Freq: Two times a day (BID) | ORAL | Status: DC
  Administered 2014-08-25 – 2014-08-26 (×3): 20 mg via ORAL
  Filled 2014-08-25 (×5): qty 1

## 2014-08-25 MED ORDER — ZOLPIDEM TARTRATE 5 MG PO TABS
5.0000 mg | ORAL_TABLET | Freq: Every day | ORAL | Status: DC
  Administered 2014-08-25: 5 mg via ORAL
  Filled 2014-08-25: qty 1

## 2014-08-25 MED ORDER — PNEUMOCOCCAL VAC POLYVALENT 25 MCG/0.5ML IJ INJ
0.5000 mL | INJECTION | Freq: Once | INTRAMUSCULAR | Status: AC
  Administered 2014-08-25: 0.5 mL via INTRAMUSCULAR
  Filled 2014-08-25: qty 0.5

## 2014-08-25 MED ORDER — POTASSIUM CHLORIDE IN NACL 20-0.9 MEQ/L-% IV SOLN
INTRAVENOUS | Status: DC
  Administered 2014-08-25: 02:00:00 via INTRAVENOUS
  Filled 2014-08-25 (×2): qty 1000

## 2014-08-25 MED ORDER — LISINOPRIL 20 MG PO TABS
20.0000 mg | ORAL_TABLET | Freq: Every day | ORAL | Status: DC
  Administered 2014-08-25 – 2014-08-26 (×2): 20 mg via ORAL
  Filled 2014-08-25 (×2): qty 1

## 2014-08-25 MED ORDER — PAROXETINE HCL 30 MG PO TABS
60.0000 mg | ORAL_TABLET | Freq: Every day | ORAL | Status: DC
  Administered 2014-08-25 – 2014-08-26 (×2): 60 mg via ORAL
  Filled 2014-08-25 (×2): qty 2

## 2014-08-25 MED ORDER — HEPARIN SODIUM (PORCINE) 5000 UNIT/ML IJ SOLN
5000.0000 [IU] | Freq: Three times a day (TID) | INTRAMUSCULAR | Status: DC
  Administered 2014-08-25 – 2014-08-26 (×4): 5000 [IU] via SUBCUTANEOUS
  Filled 2014-08-25 (×7): qty 1

## 2014-08-25 MED ORDER — ATORVASTATIN CALCIUM 40 MG PO TABS
40.0000 mg | ORAL_TABLET | Freq: Every day | ORAL | Status: DC
  Administered 2014-08-25 – 2014-08-26 (×2): 40 mg via ORAL
  Filled 2014-08-25 (×2): qty 1

## 2014-08-25 MED ORDER — SODIUM CHLORIDE 0.9 % IJ SOLN
3.0000 mL | Freq: Two times a day (BID) | INTRAMUSCULAR | Status: DC
  Administered 2014-08-25 – 2014-08-26 (×2): 3 mL via INTRAVENOUS

## 2014-08-25 MED ORDER — NITROGLYCERIN 0.4 MG SL SUBL: 0.4000 mg | SUBLINGUAL_TABLET | SUBLINGUAL | Status: DC | PRN

## 2014-08-25 MED ORDER — AMLODIPINE BESYLATE 10 MG PO TABS
20.0000 mg | ORAL_TABLET | Freq: Every day | ORAL | Status: DC
  Administered 2014-08-25: 20 mg via ORAL
  Filled 2014-08-25: qty 2

## 2014-08-25 MED ORDER — BUTALBITAL-APAP-CAFFEINE 50-325-40 MG PO TABS
1.0000 | ORAL_TABLET | Freq: Four times a day (QID) | ORAL | Status: DC | PRN
  Administered 2014-08-25: 2 via ORAL
  Filled 2014-08-25: qty 2

## 2014-08-25 MED ORDER — AMLODIPINE BESYLATE 10 MG PO TABS
10.0000 mg | ORAL_TABLET | Freq: Every day | ORAL | Status: DC
  Administered 2014-08-26: 10 mg via ORAL
  Filled 2014-08-25: qty 1

## 2014-08-25 NOTE — Progress Notes (Signed)
  Echocardiogram 2D Echocardiogram has been performed.  Cathie BeamsGREGORY, Jaece Ducharme 08/25/2014, 12:59 PM

## 2014-08-25 NOTE — ED Notes (Signed)
Admitting MD at bedside.

## 2014-08-25 NOTE — Care Management Note (Addendum)
  Page 1 of 1   08/25/2014     8:44:00 AM CARE MANAGEMENT NOTE 08/25/2014  Patient:  Kimberly [name],Kimberly [name]   Account Number:  [id number]  Date Initiated:  08/25/2014  Documentation initiated by:  Junius CreamerWELL,DEBBIE  Subjective/Objective Assessment:   adm w htn crisis     Action/Plan:   lives w husband, pcp dr Benetta Sparvictoria rankins   Anticipated DC Date:     Anticipated DC Plan:  HOME/SELF CARE         Choice offered to / List presented to:             Status of service:   Medicare Important Message given?   (If response is "NO", the following Medicare IM given date fields will be blank) Date Medicare IM given:   Medicare IM given by:   Date Additional Medicare IM given:   Additional Medicare IM given by:    Discharge Disposition:    Per UR Regulation:  Reviewed for med. necessity/level of care/duration of stay  If discussed at Long Length of Stay Meetings, dates discussed:    Comments:

## 2014-08-25 NOTE — H&P (Signed)
Hospitalist Admission History and Physical  Patient name: Kimberly [name] Medical record number: [id number] Date of birth: 11/06/1940 Age: 74 y.o. Gender: female  Primary Care Provider: Beverley Fiedler, MD  Chief Complaint: malignant HTN, headache  History of Present Illness:This is a 74 y.o. year old female with significant past medical history of HTN, OSA on CPAP, GERD presenting with malignant HTN, headache. Pt reports headache over last week. Reports hx/o intermittent headache in the past. Denies any known stressors. Reports compliance w/ medication regimen. Progressively worsening over the past 2-3 days. Pt checked her BP at home. In the 200s. Denies any CP. Mild SOB. Does admit to high salt intake. Also w/ mild LE swelling.  Presented to ER T 98.3, HR 60s, resp 10s, BP intially in 240s-now in 160s on cardene drip. Satting 95% on RA. CBC and CMET WNL apart from K 3.4. Cr 0.86. Trop neg x1. EKG sinus rhythm. No acute ST/T wave changes. CXR pending.   Assessment and Plan: Kimberly [name] is a 74 y.o. year old female presenting with malignant HTN, headache   Active Problems:   Malignant hypertension   1-Malignant Hypertension -unclear etiology -trop and EKG WNL  -improved s/p cardene gtt -transition to prn hydralazine -try avoid BP < 160 as to avoid hypoperfusion -2D ECHO  -BNP  -cycle CEs -cont home regimen -stepdown bed   2- Headache -likely secondary to above -head CT WNL  -no focal neuological deficits -improved w/ BP management and pain medication in the ER -MRI  -follow -neuro consult as clinically indicated   3-GERD -stable  -cont home regimen  FEN/GI: heart healthy diet  Prophylaxis: sub q heparin  Disposition: pending further evaluation Code Status:Full Code    Patient Active Problem List   Diagnosis Date Noted  . Malignant hypertension 08/25/2014  . Rhinitis sicca 07/02/2014  . Hypercalcemia 12/09/2013  . Obstructive sleep apnea 08/29/2013  . Acid  reflux    Past Medical History: Past Medical History  Diagnosis Date  . Hypertension   . Acid reflux   . HOH (hard of hearing)     wears hearing aid in left ear  . Sleep apnea     CPAP at bedtime  . Shortness of breath     SOB if laid flat  . PONV (postoperative nausea and vomiting)   . Heart murmur   . Frequency of urination   . Hypercholesteremia   . Anxiety   . Mental disorder   . Depression   . Arthritis   . Insomnia   . Back spasm   . Fuch's endothelial dystrophy     bilateral eyes    Past Surgical History: Past Surgical History  Procedure Laterality Date  . Cesarean section      x 2  . Cholecystectomy    . Abdominal hysterectomy    . Appendectomy    . Rotator cuff repair Left   . Eye surgery Right     implant  . Colonoscopy    . Cardiac catheterization      no PCI  . Shoulder arthroscopy with subacromial decompression and bicep tendon repair Left 08/24/2012    Procedure: SHOULDER ARTHROSCOPY WITH SUBACROMIAL DECOMPRESSION AND BICEP TENDON REPAIR;  Surgeon: Cammy Copa, MD;  Location: [facility] OR;  Service: Orthopedics;  Laterality: Left;  Left Shoulder Arthroscopy, Debridement, Biceps Tenotomy    Social History: History   Social History  . Marital Status: Married    Spouse Name: N/A  . Number of Children: 2  .  Years of Education: N/A   Occupational History  . retired-assistant teacher    Social History Main Topics  . Smoking status: Never Smoker   . Smokeless tobacco: Not on file  . Alcohol Use: No  . Drug Use: No  . Sexual Activity: Not on file   Other Topics Concern  . None   Social History Narrative    Family History: Family History  Problem Relation Age of Onset  . Stomach cancer Mother   . Cancer Sister     intestinal    Allergies: Allergies  Allergen Reactions  . Percocet [Oxycodone-Acetaminophen] Shortness Of Breath and Other (See Comments)    Pt couldn't breathe or catch her breath  . Codeine Other (See Comments)     hyperactivity  . Other Other (See Comments)    [facility] caused hallucinations   . Phenergan [Promethazine Hcl] Other (See Comments)    Hallucinations     Current Facility-Administered Medications  Medication Dose Route Frequency Provider Last Rate Last Dose  . 0.9 % NaCl with KCl 20 mEq/ L  infusion   Intravenous Continuous Floydene FlockSteven J Jameisha Stofko, MD      . heparin injection 5,000 Units  5,000 Units Subcutaneous 3 times per day Floydene FlockSteven J Tabithia Stroder, MD      . hydrALAZINE (APRESOLINE) injection 10 mg  10 mg Intravenous Q4H PRN Floydene FlockSteven J Elouise Divelbiss, MD      . sodium chloride 0.9 % injection 3 mL  3 mL Intravenous Q12H Floydene FlockSteven J Lawrie Tunks, MD       Current Outpatient Prescriptions  Medication Sig Dispense Refill  . amLODipine (NORVASC) 10 MG tablet Take 20 mg by mouth daily.    Marland Kitchen. aspirin 81 MG chewable tablet Chew 81 mg by mouth daily.    Marland Kitchen. atorvastatin (LIPITOR) 40 MG tablet Take 40 mg by mouth daily.    . fenofibrate micronized (LOFIBRA) 200 MG capsule Take 200 mg by mouth daily before breakfast.    . furosemide (LASIX) 20 MG tablet Take 20 mg by mouth 2 (two) times daily.    Marland Kitchen. KRILL OIL PO Take 1 capsule by mouth daily.    Marland Kitchen. lisinopril (PRINIVIL,ZESTRIL) 20 MG tablet Take 20 mg by mouth daily.    . nitroGLYCERIN (NITROSTAT) 0.4 MG SL tablet Place 0.4 mg under the tongue every 5 (five) minutes as needed for chest pain.     Marland Kitchen. PARoxetine (PAXIL) 20 MG tablet Take 60 mg by mouth every morning.    . traMADol (ULTRAM) 50 MG tablet Take 100 mg by mouth daily as needed for moderate pain.     Marland Kitchen. zolpidem (AMBIEN) 10 MG tablet Take 5 mg by mouth at bedtime. For sleep     Review Of Systems: 12 point ROS negative except as noted above in HPI.  Physical Exam: Filed Vitals:   08/24/14 2340  BP: 160/58  Pulse: 68  Temp:   Resp:     General: alert and cooperative HEENT: PERRLA and extra ocular movement intact Heart: S1, S2 normal, no murmur, rub or gallop,  regular rate and rhythm Lungs: clear to auscultation, no wheezes or rales and unlabored breathing Abdomen: abdomen is soft without significant tenderness, masses, organomegaly or guarding Extremities: trace LE edema bilaterally Skin:no rashes Neurology: normal without focal findings  Labs and Imaging: Lab Results  Component Value Date/Time   NA 140 08/24/2014 09:12 PM   K 3.4* 08/24/2014 09:12 PM   CL 102 08/24/2014 09:12 PM  CO2 24 08/24/2014 09:12 PM   BUN 14 08/24/2014 09:12 PM   CREATININE 0.86 08/24/2014 09:12 PM   GLUCOSE 139* 08/24/2014 09:12 PM   Lab Results  Component Value Date   WBC 7.2 08/24/2014   HGB 12.9 08/24/2014   HCT 38.8 08/24/2014   MCV 87.6 08/24/2014   PLT 202 08/24/2014    Ct Head Wo Contrast  08/24/2014   CLINICAL DATA:  Headache, nausea, vomiting  EXAM: CT HEAD WITHOUT CONTRAST  TECHNIQUE: Contiguous axial images were obtained from the base of the skull through the vertex without intravenous contrast.  COMPARISON:  None.  FINDINGS: No evidence of parenchymal hemorrhage or extra-axial fluid collection. No mass lesion, mass effect, or midline shift.  No CT evidence of acute infarction.  Mild small vessel ischemic changes.  Intracranial atherosclerosis.  Cerebral volume is within normal limits.  No ventriculomegaly.  The visualized paranasal sinuses are essentially clear. The mastoid air cells are unopacified.  No evidence of calvarial fracture.  IMPRESSION: No evidence of acute intracranial abnormality.  Mild small vessel ischemic changes.   Electronically Signed   By: Charline Bills M.D.   On: 08/24/2014 22:22           Doree Albee MD  Pager: [phone]

## 2014-08-25 NOTE — Progress Notes (Signed)
[facility] TEAM 1 - Stepdown/ICU TEAM Progress Note  Kimberly [name] ZOX:[id number] DOB: 1941-02-24 DOA: 08/24/2014 PCP: Beverley Fiedler, MD  Admit HPI / Brief Narrative: 74 y.o. female with history of HTN, OSA on CPAP, and GERD who presented with malignant HTN and a headache for one week. Reported compliance w/ medication regimen. Progressively worsening over the preceding 2-3 days. Pt checked her BP at home and found it to be in the 200s. Denied any CP. Mild SOB. Did admit to high salt intake.    In the ER T 98.3, HR 60s, resp 10s, BP intially in 240s > 160s on cardene drip. Satting 95% on RA. CBC and CMET WNL apart from K 3.4. Cr 0.86. Trop neg x1. EKG sinus rhythm. No acute ST/T wave changes.  HPI/Subjective: Pt seen for f/u visit.    Assessment/Plan:  Malignant Hypertension  Mildly elevated troponin  HA  OSA  Hypercalcemia Diagnosed 2012 - followed by Dr. Elvera Lennox (Endo)  GERD  Code Status: FULL Family Communication: no family present at time of exam Disposition Plan: transfer to tele bed - f/u results of TTE - trend troponin - watch BP - probable D/C home tomorrow  Consultants: none  Procedures: TTE - pending   Antibiotics: none  DVT prophylaxis: SQ heparin   Objective: Blood pressure 171/49, pulse 35, temperature 99.2 F (37.3 C), temperature source Oral, resp. rate 22, height 5' (1.524 m), weight 82.5 kg (181 lb 14.1 oz), SpO2 96 %.  Intake/Output Summary (Last 24 hours) at 08/25/14 1115 Last data filed at 08/25/14 1100  Gross per 24 hour  Intake    800 ml  Output    300 ml  Net    500 ml   Exam: Pt seen for f/u visit  Data Reviewed: Basic Metabolic Panel:  Recent Labs Lab 08/24/14 2112 08/25/14 0305  NA 140 141  K 3.4* 3.6  CL 102 104  CO2 24 26  GLUCOSE 139* 134*  BUN 14 14  CREATININE 0.86 0.82  CALCIUM 10.3 10.0    Liver Function Tests:  Recent Labs Lab 08/24/14 2112 08/25/14 0305  AST 31 27  ALT 23 20  ALKPHOS 42 38*    BILITOT 0.8 0.7  PROT 7.5 6.5  ALBUMIN 4.6 4.0   CBC:  Recent Labs Lab 08/24/14 2112 08/25/14 0305  WBC 7.2 7.6  NEUTROABS  --  5.4  HGB 12.9 12.1  HCT 38.8 37.1  MCV 87.6 88.8  PLT 202 203    Cardiac Enzymes:  Recent Labs Lab 08/25/14 0032 08/25/14 0305  TROPONINI 0.08* 0.08*     Recent Results (from the past 240 hour(s))  MRSA PCR Screening     Status: None   Collection Time: 08/25/14  2:50 AM  Result Value Ref Range Status   MRSA by PCR NEGATIVE NEGATIVE Final    Comment:        The GeneXpert MRSA Assay (FDA approved for NASAL specimens only), is one component of a comprehensive MRSA colonization surveillance program. It is not intended to diagnose MRSA infection nor to guide or monitor treatment for MRSA infections.      Studies:   Recent x-ray studies have been reviewed in detail by the Attending Physician  Scheduled Meds:  Scheduled Meds: . amLODipine  20 mg Oral Daily  . aspirin  81 mg Oral Daily  . atorvastatin  40 mg Oral Daily  . heparin  5,000 Units Subcutaneous 3 times per day  . lisinopril  20 mg Oral Daily  .  PARoxetine  60 mg Oral Daily  . sodium chloride  3 mL Intravenous Q12H  . zolpidem  5 mg Oral QHS    Time spent on care of this patient: no charge   Lonia BloodMCCLUNG,JEFFREY T , MD   [facility] Office  [phone] Pager - Text Page per Loretha StaplerAmion as per below:  On-Call/Text Page:      [email or web address]      password [email or web address]  If 7PM-7AM, please contact night-coverage [email or web address] Password [email or web address] 08/25/2014, 11:15 AM   LOS: 0 days

## 2014-08-25 NOTE — Progress Notes (Signed)
Placed patient on CPAP for the night at home setting of 13cmH20 via medium nasal mask.  Patient is tolerating well at this time.

## 2014-08-26 LAB — CBC
HCT: 34.3 % — ABNORMAL LOW (ref 36.0–46.0)
Hemoglobin: 11.1 g/dL — ABNORMAL LOW (ref 12.0–15.0)
MCH: 28.8 pg (ref 26.0–34.0)
MCHC: 32.4 g/dL (ref 30.0–36.0)
MCV: 88.9 fL (ref 78.0–100.0)
Platelets: 225 10*3/uL (ref 150–400)
RBC: 3.86 MIL/uL — ABNORMAL LOW (ref 3.87–5.11)
RDW: 13.7 % (ref 11.5–15.5)
WBC: 6.6 10*3/uL (ref 4.0–10.5)

## 2014-08-26 LAB — BASIC METABOLIC PANEL
Anion gap: 10 (ref 5–15)
BUN: 20 mg/dL (ref 6–23)
CO2: 27 mmol/L (ref 19–32)
CREATININE: 0.88 mg/dL (ref 0.50–1.10)
Calcium: 9.7 mg/dL (ref 8.4–10.5)
Chloride: 103 mmol/L (ref 96–112)
GFR calc Af Amer: 74 mL/min — ABNORMAL LOW (ref >90)
GFR calc non Af Amer: 64 mL/min — ABNORMAL LOW (ref >90)
Glucose, Bld: 103 mg/dL — ABNORMAL HIGH (ref 70–99)
POTASSIUM: 4.3 mmol/L (ref 3.5–5.1)
Sodium: 140 mmol/L (ref 135–145)

## 2014-08-26 LAB — TROPONIN I: TROPONIN I: 0.04 ng/mL — AB (ref <0.031)

## 2014-08-26 MED ORDER — CARVEDILOL 6.25 MG PO TABS
6.2500 mg | ORAL_TABLET | Freq: Two times a day (BID) | ORAL | Status: DC
  Filled 2014-08-26 (×3): qty 1

## 2014-08-26 MED ORDER — BUTALBITAL-APAP-CAFFEINE 50-325-40 MG PO TABS: 1.0000 | ORAL_TABLET | Freq: Four times a day (QID) | ORAL | Status: DC | PRN

## 2014-08-26 NOTE — Progress Notes (Signed)
Pt left floor via wheelchair accompanied by staff and family. 

## 2014-08-26 NOTE — Discharge Summary (Signed)
DISCHARGE SUMMARY  Kimberly PlanBonnie C [name]  MR#: [id number]  DOB:[birth year]  Date of Admission: 08/24/2014 Date of Discharge: 08/26/2014  Attending Physician:Kimberly [name]  Patient's JTT:[id number]:RANKINS,VICTORIA, MD  Consults:  none  Disposition: D/C home   Follow-up Appts:     Follow-up Information    Follow up with Kimberly FiedlerANKINS,VICTORIA, MD. Schedule an appointment as soon as possible for a visit in 3 days.   Specialty:  Family Medicine   Contact information:   [address] [place] [address] [phone]      Tests Needing Follow-up: -recheck of BP will be indicated w/ possible need to further titrate medical therapy   Discharge Diagnoses: Malignant Hypertension causing grade 2 diastolic CHF and refractory HA Mildly elevated troponin HA OSA Hypercalcemia - chronic idiopathic  GERD  Initial presentation: 74 y.o. female with history of HTN, OSA on CPAP, and GERD who presented with malignant HTN and a headache for one week. Reported compliance w/ medication regimen. Progressively worsening over the preceding 2-3 days. Pt checked her BP at home and found it to be in the 200s. Denied any CP. Mild SOB. Did admit to high salt intake.   In the ER [name] 98.3, HR 60s, resp 10s, BP intially in 240s > 160s on cardene drip. Satting 95% on RA. CBC and CMET WNL apart from K 3.4. Cr 0.86. Trop neg x1. EKG sinus rhythm. No acute ST/[name] wave changes.  Hospital Course:  Malignant Hypertension The patient was initially treated with a Cardene drip - this was rapidly discontinued however as it led to initial overcorrection of her hypertension - Coreg at a dose of 3.125 mg by mouth twice a day was then added to her regimen but the patient was not able tolerate this as it dropped her heart rate as low as 50 - in the final portion of her hospital stay her blood pressure actually proved to be moderately controlled on her usual home regimen - out of a desire to avoid overaggressive acute blood  pressure lowering the decision was made at the time of discharge to simply return to her home regimen - she is advised to very close follow-up with her primary care physician within 3-5 days will be absolutely indicated to allow reassessment of her blood pressure  Mildly elevated troponin The patient's troponin peaked at 0.08 - at no time during her hospital stay did she complain of chest pain - her EKG was without acute ST or [name]-wave changes - a transthoracic echocardiogram was accomplished and revealed no focal wall motion abnormalities and a preserved ejection fraction - her mildly elevated troponin was felt to be due to malignant hypertension and further inpatient workup was not felt to be indicated  HA The patient's refractory headache was felt to be directly related to her malignant hypertension - correction of the blood pressure alone provided the majority the relief but patient also benefited from intermittent use of Fioricet - the time of her discharge her headache has resolved  Newly appreciated diastolic congestive heart failure As noted above a transthoracic echocardiogram was accomplished during his hospital stay and revealed evidence of grade 2 diastolic dysfunction with preserved systolic function - LVH was also appreciated - these findings are consistent with hypertensive heart disease - the patient is already on an ACE inhibitor and this was continued her hospital stay - likewise her Lasix dose was not adjusted as she did not appear to be volume overloaded - she did not tolerate a low-dose beta blocker due  to bradycardia -  her volume status should be monitored in subsequent visits as she has been advised that her hypertension has artery lead to some structural heart disease and informed of the extreme importance of strictly adhering to her medical regimen to control her blood pressure to prevent further damage   OSA  stable during hospital stay  Hypercalcemia Diagnosed 2012 - followed  by Dr. Elvera Lennox (Endo)  GERD  stable during hospital stay    Medication List    TAKE these medications        amLODipine 10 MG tablet  Commonly known as:  NORVASC  Take 20 mg by mouth daily.     aspirin 81 MG chewable tablet  Chew 81 mg by mouth daily.     atorvastatin 40 MG tablet  Commonly known as:  LIPITOR  Take 40 mg by mouth daily.     butalbital-acetaminophen-caffeine 50-325-40 MG per tablet  Commonly known as:  FIORICET, ESGIC  Take 1-2 tablets by mouth every 6 (six) hours as needed for headache.     fenofibrate micronized 200 MG capsule  Commonly known as:  LOFIBRA  Take 200 mg by mouth daily before breakfast.     furosemide 20 MG tablet  Commonly known as:  LASIX  Take 20 mg by mouth 2 (two) times daily.     KRILL OIL PO  Take 1 capsule by mouth daily.     lisinopril 20 MG tablet  Commonly known as:  PRINIVIL,ZESTRIL  Take 20 mg by mouth daily.     nitroGLYCERIN 0.4 MG SL tablet  Commonly known as:  NITROSTAT  Place 0.4 mg under the tongue every 5 (five) minutes as needed for chest pain.     PARoxetine 20 MG tablet  Commonly known as:  PAXIL  Take 60 mg by mouth every morning.     traMADol 50 MG tablet  Commonly known as:  ULTRAM  Take 100 mg by mouth daily as needed for moderate pain.     zolpidem 10 MG tablet  Commonly known as:  AMBIEN  Take 5 mg by mouth at bedtime. For sleep        Day of Discharge BP 150/56 mmHg  Pulse 55  Temp(Src) 98.6 F (37 C) (Oral)  Resp 18  Ht 5' (1.524 m)  Wt 83.598 kg (184 lb 4.8 oz)  BMI 35.99 kg/m2  SpO2 95%  Physical Exam: General: No acute respiratory distress - alert and conversant Lungs: Clear to auscultation bilaterally without wheezes or crackles Cardiovascular: Regular rate and rhythm without murmur gallop or rub Abdomen: Nontender, nondistended, soft, bowel sounds positive, no rebound, no ascites, no appreciable mass Extremities: No significant cyanosis, clubbing, or edema bilateral lower  extremities  Basic Metabolic Panel:  Recent Labs Lab 08/24/14 2112 08/25/14 0305 08/26/14 0455  NA 140 141 140  K 3.4* 3.6 4.3  CL 102 104 103  CO2 GLUCOSE 139* 134* 103*  BUN CREATININE 0.86 0.82 0.88  CALCIUM 10.3 10.0 9.7    Liver Function Tests:  Recent Labs Lab 08/24/14 2112 08/25/14 0305  AST 31 27  ALT 23 20  ALKPHOS 42 38*  BILITOT 0.8 0.7  PROT 7.5 6.5  ALBUMIN 4.6 4.0   CBC:  Recent Labs Lab 08/24/14 2112 08/25/14 0305 08/26/14 0455  WBC 7.2 7.6 6.6  NEUTROABS  --  5.4  --   HGB 12.9 12.1 11.1*  HCT 38.8 37.1 34.3*  MCV 87.6 88.8  88.9  PLT 202 203 225    Cardiac Enzymes:  Recent Labs Lab 08/25/14 0032 08/25/14 0305 08/25/14 1151 08/26/14 0455  TROPONINI 0.08* 0.08* 0.06* 0.04*   BNP (last 3 results)  Recent Labs  08/25/14 0032  BNP 149.5*    Recent Results (from the past 240 hour(s))  MRSA PCR Screening     Status: None   Collection Time: 08/25/14  2:50 AM  Result Value Ref Range Status   MRSA by PCR NEGATIVE NEGATIVE Final    Comment:        The GeneXpert MRSA Assay (FDA approved for NASAL specimens only), is one component of a comprehensive MRSA colonization surveillance program. It is not intended to diagnose MRSA infection nor to guide or monitor treatment for MRSA infections.       Time spent in discharge (includes decision making & examination of pt): > 35 minutes  08/26/2014, 2:05 PM   Lonia Blood, MD [facility] Office  [phone] Pager [phone]  On-Call/Text Page:      [email or web address]      password [email or web address]

## 2014-08-26 NOTE — Progress Notes (Signed)
Pt discharge instructions and prescription given, pt verbalized understanding.

## 2014-08-26 NOTE — Progress Notes (Signed)
Paged Dr. Sharon SellerMcClung HR 55 does he want to hold am 6.25mg  this am dose.

## 2014-08-26 NOTE — Discharge Instructions (Signed)
Hypertension °Hypertension, commonly called high blood pressure, is when the force of blood pumping through your arteries is too strong. Your arteries are the blood vessels that carry blood from your heart throughout your body. A blood pressure reading consists of a higher number over a lower number, such as 110/72. The higher number (systolic) is the pressure inside your arteries when your heart pumps. The lower number (diastolic) is the pressure inside your arteries when your heart relaxes. Ideally you want your blood pressure below 120/80. °Hypertension forces your heart to work harder to pump blood. Your arteries may become narrow or stiff. Having hypertension puts you at risk for heart disease, stroke, and other problems.  °RISK FACTORS °Some risk factors for high blood pressure are controllable. Others are not.  °Risk factors you cannot control include:  °· Race. You may be at higher risk if you are African American. °· Age. Risk increases with age. °· Gender. Men are at higher risk than women before age 45 years. After age 65, women are at higher risk than men. °Risk factors you can control include: °· Not getting enough exercise or physical activity. °· Being overweight. °· Getting too much fat, sugar, calories, or salt in your diet. °· Drinking too much alcohol. °SIGNS AND SYMPTOMS °Hypertension does not usually cause signs or symptoms. Extremely high blood pressure (hypertensive crisis) may cause headache, anxiety, shortness of breath, and nosebleed. °DIAGNOSIS  °To check if you have hypertension, your health care provider will measure your blood pressure while you are seated, with your arm held at the level of your heart. It should be measured at least twice using the same arm. Certain conditions can cause a difference in blood pressure between your right and left arms. A blood pressure reading that is higher than normal on one occasion does not mean that you need treatment. If one blood pressure reading  is high, ask your health care provider about having it checked again. °TREATMENT  °Treating high blood pressure includes making lifestyle changes and possibly taking medicine. Living a healthy lifestyle can help lower high blood pressure. You may need to change some of your habits. °Lifestyle changes may include: °· Following the DASH diet. This diet is high in fruits, vegetables, and whole grains. It is low in salt, red meat, and added sugars. °· Getting at least 2½ hours of brisk physical activity every week. °· Losing weight if necessary. °· Not smoking. °· Limiting alcoholic beverages. °· Learning ways to reduce stress. ° If lifestyle changes are not enough to get your blood pressure under control, your health care provider may prescribe medicine. You may need to take more than one. Work closely with your health care provider to understand the risks and benefits. °HOME CARE INSTRUCTIONS °· Have your blood pressure rechecked as directed by your health care provider.   °· Take medicines only as directed by your health care provider. Follow the directions carefully. Blood pressure medicines must be taken as prescribed. The medicine does not work as well when you skip doses. Skipping doses also puts you at risk for problems.   °· Do not smoke.   °· Monitor your blood pressure at home as directed by your health care provider.  °SEEK MEDICAL CARE IF:  °· You think you are having a reaction to medicines taken. °· You have recurrent headaches or feel dizzy. °· You have swelling in your ankles. °· You have trouble with your vision. °SEEK IMMEDIATE MEDICAL CARE IF: °· You develop a severe headache or confusion. °·   You have unusual weakness, numbness, or feel faint.  You have severe chest or abdominal pain.  You vomit repeatedly.  You have trouble breathing. MAKE SURE YOU:   Understand these instructions.  Will watch your condition.  Will get help right away if you are not doing well or get worse. Document  Released: 04/28/2005 Document Revised: 09/12/2013 Document Reviewed: 02/18/2013 [facility] Patient Information 2015 [place], [place]. This information is not intended to replace advice given to you by your health care provider. Make sure you discuss any questions you have with your health care provider.  Cardiac Diet This diet can help prevent heart disease and stroke. Many factors influence your heart health, including eating and exercise habits. Coronary risk rises a lot with abnormal blood fat (lipid) levels. Cardiac meal planning includes limiting unhealthy fats, increasing healthy fats, and making other small dietary changes. General guidelines are as follows:  Adjust calorie intake to reach and maintain desirable body weight.  Limit total fat intake to less than 30% of total calories. Saturated fat should be less than 7% of calories.  Saturated fats are found in animal products and in some vegetable products. Saturated vegetable fats are found in coconut oil, cocoa butter, palm oil, and palm kernel oil. Read labels carefully to avoid these products as much as possible. Use butter in moderation. Choose tub margarines and oils that have 2 grams of fat or less. Good cooking oils are canola and olive oils.  Practice low-fat cooking techniques. Do not fry food. Instead, broil, bake, boil, steam, grill, roast on a rack, stir-fry, or microwave it. Other fat reducing suggestions include:  Remove the skin from poultry.  Remove all visible fat from meats.  Skim the fat off stews, soups, and gravies before serving them.  Steam vegetables in water or broth instead of sauting them in fat.  Avoid foods with trans fat (or hydrogenated oils), such as commercially fried foods and commercially baked goods. Commercial shortening and deep-frying fats will contain trans fat.  Increase intake of fruits, vegetables, whole grains, and legumes to replace foods high in fat.  Increase consumption of nuts,  legumes, and seeds to at least 4 servings weekly. One serving of a legume equals  cup, and 1 serving of nuts or seeds equals  cup.  Choose whole grains more often. Have 3 servings per day (a serving is 1 ounce [oz]).  Eat 4 to 5 servings of vegetables per day. A serving of vegetables is 1 cup of raw leafy vegetables;  cup of raw or cooked cut-up vegetables;  cup of vegetable juice.  Eat 4 to 5 servings of fruit per day. A serving of fruit is 1 medium whole fruit;  cup of dried fruit;  cup of fresh, frozen, or canned fruit;  cup of 100% fruit juice.  Increase your intake of dietary fiber to 20 to 30 grams per day. Insoluble fiber may help lower your risk of heart disease and may help curb your appetite.  Soluble fiber binds cholesterol to be removed from the blood. Foods high in soluble fiber are dried beans, citrus fruits, oats, apples, bananas, broccoli, Brussels sprouts, and eggplant.  Try to include foods fortified with plant sterols or stanols, such as yogurt, breads, juices, or margarines. Choose several fortified foods to achieve a daily intake of 2 to 3 grams of plant sterols or stanols.  Foods with omega-3 fats can help reduce your risk of heart disease. Aim to have a 3.5 oz portion of fatty fish  twice per week, such as salmon, mackerel, albacore tuna, sardines, lake trout, or herring. If you wish to take a fish oil supplement, choose one that contains 1 gram of both DHA and EPA.  Limit processed meats to 2 servings (3 oz portion) weekly.  Limit the sodium in your diet to 1500 milligrams (mg) per day. If you have high blood pressure, talk to a registered dietitian about a DASH (Dietary Approaches to Stop Hypertension) eating plan.  Limit sweets and beverages with added sugar, such as soda, to no more than 5 servings per week. One serving is:   1 tablespoon sugar.  1 tablespoon jelly or jam.   cup sorbet.  1 cup lemonade.   cup regular soda. CHOOSING  FOODS Starches  Allowed: Breads: All kinds (wheat, rye, raisin, white, oatmeal, [place], [place], and English muffin bread). Low-fat rolls: English muffins, frankfurter and hamburger buns, bagels, pita bread, tortillas (not fried). Pancakes, waffles, biscuits, and muffins made with recommended oil.  Avoid: Products made with saturated or trans fats, oils, or whole milk products. Butter rolls, cheese breads, croissants. Commercial doughnuts, muffins, sweet rolls, biscuits, waffles, pancakes, store-bought mixes. Crackers  Allowed: Low-fat crackers and snacks: Animal, graham, rye, saltine (with recommended oil, no lard), oyster, and matzo crackers. Bread sticks, melba toast, rusks, flatbread, pretzels, and light popcorn.  Avoid: High-fat crackers: cheese crackers, butter crackers, and those made with coconut, palm oil, or trans fat (hydrogenated oils). Buttered popcorn. Cereals  Allowed: Hot or cold whole-grain cereals.  Avoid: Cereals containing coconut, hydrogenated vegetable fat, or animal fat. Potatoes / Pasta / Rice  Allowed: All kinds of potatoes, rice, and pasta (such as macaroni, spaghetti, and noodles).  Avoid: Pasta or rice prepared with cream sauce or high-fat cheese. Chow mein noodles, JamaicaFrench fries. Vegetables  Allowed: All vegetables and vegetable juices.  Avoid: Fried vegetables. Vegetables in cream, butter, or high-fat cheese sauces. Limit coconut. Fruit in cream or custard. Protein  Allowed: Limit your intake of meat, seafood, and poultry to no more than 6 oz (cooked weight) per day. All lean, well-trimmed beef, veal, pork, and lamb. All chicken and [place] without skin. All fish and shellfish. Wild game: wild duck, rabbit, pheasant, and venison. Egg whites or low-cholesterol egg substitutes may be used as desired. Meatless dishes: recipes with dried beans, peas, lentils, and tofu (soybean curd). Seeds and nuts: all seeds and most nuts.  Avoid: Prime grade and other heavily  marbled and fatty meats, such as short ribs, spare ribs, rib eye roast or steak, frankfurters, sausage, bacon, and high-fat luncheon meats, mutton. Caviar. Commercially fried fish. Domestic duck, goose, venison sausage. Organ meats: liver, gizzard, heart, chitterlings, brains, kidney, sweetbreads. Dairy  Allowed: Low-fat cheeses: nonfat or low-fat cottage cheese (1% or 2% fat), cheeses made with part skim milk, such as mozzarella, farmers, string, or ricotta. (Cheeses should be labeled no more than 2 to 6 grams fat per oz.). Skim (or 1%) milk: liquid, powdered, or evaporated. Buttermilk made with low-fat milk. Drinks made with skim or low-fat milk or cocoa. Chocolate milk or cocoa made with skim or low-fat (1%) milk. Nonfat or low-fat yogurt.  Avoid: Whole milk cheeses, including colby, cheddar, mu[place]ster, [address] Jack, Alvarado[place]avarti, [place]rie, [place]embert, [address], Swiss, and blue. Creamed cottage cheese, cream cheese. Whole milk and whole milk products, including buttermilk or yogurt made from whole milk, drinks made from whole milk. Condensed milk, evaporated whole milk, and 2% milk. Soups and Combination Foods  Allowed: Low-fat low-sodium soups: broth, dehydrated soups, homemade  broth, soups with the fat removed, homemade cream soups made with skim or low-fat milk. Low-fat spaghetti, lasagna, chili, and Spanish rice if low-fat ingredients and low-fat cooking techniques are used.  Avoid: Cream soups made with whole milk, cream, or high-fat cheese. All other soups. Desserts and Sweets  Allowed: Sherbet, fruit ices, gelatins, meringues, and angel food cake. Homemade desserts with recommended fats, oils, and milk products. Jam, jelly, honey, marmalade, sugars, and syrups. Pure sugar candy, such as gum drops, hard candy, jelly beans, marshmallows, mints, and small amounts of dark chocolate.  Avoid: Commercially prepared cakes, pies, cookies, frosting, pudding, or mixes for these products. Desserts containing  whole milk products, chocolate, coconut, lard, palm oil, or palm kernel oil. Ice cream or ice cream drinks. Candy that contains chocolate, coconut, butter, hydrogenated fat, or unknown ingredients. Buttered syrups. Fats and Oils  Allowed: Vegetable oils: safflower, sunflower, corn, soybean, cottonseed, sesame, canola, olive, or peanut. Non-hydrogenated margarines. Salad dressing or mayonnaise: homemade or commercial, made with a recommended oil. Low or nonfat salad dressing or mayonnaise.  Limit added fats and oils to 6 to 8 tsp per day (includes fats used in cooking, baking, salads, and spreads on bread). Remember to count the "hidden fats" in foods.  Avoid: Solid fats and shortenings: butter, lard, salt pork, bacon drippings. Gravy containing meat fat, shortening, or suet. Cocoa butter, coconut. Coconut oil, palm oil, palm kernel oil, or hydrogenated oils: these ingredients are often used in bakery products, nondairy creamers, whipped toppings, candy, and commercially fried foods. Read labels carefully. Salad dressings made of unknown oils, sour cream, or cheese, such as blue cheese and Roquefort. Cream, all kinds: half-and-half, light, heavy, or whipping. Sour cream or cream cheese (even if "light" or low-fat). Nondairy cream substitutes: coffee creamers and sour cream substitutes made with palm, palm kernel, hydrogenated oils, or coconut oil. Beverages  Allowed: Coffee (regular or decaffeinated), tea. Diet carbonated beverages, mineral water. Alcohol: Check with your caregiver. Moderation is recommended.  Avoid: Whole milk, regular sodas, and juice drinks with added sugar. Condiments  Allowed: All seasonings and condiments. Cocoa powder. "Cream" sauces made with recommended ingredients.  Avoid: Carob powder made with hydrogenated fats. SAMPLE MENU Breakfast   cup orange juice   cup oatmeal  1 slice toast  1 tsp margarine  1 cup skim milk Lunch  [place] sandwich with 2 oz [place],  2 slices bread  Lettuce and tomato slices  Fresh fruit  Carrot sticks  Coffee or tea Snack  Fresh fruit or low-fat crackers Dinner  3 oz lean ground beef  1 baked potato  1 tsp margarine   cup asparagus  Lettuce salad  1 tbs non-creamy dressing   cup peach slices  1 cup skim milk Document Released: 02/05/2008 Document Revised: 10/28/2011 Document Reviewed: 06/28/2013 [employer] Patient Information 2015 [place], [place]. This information is not intended to replace advice given to you by your health care provider. Make sure you discuss any questions you have with your health care provider.

## 2014-09-11 ENCOUNTER — Encounter: Payer: Self-pay | Admitting: Cardiology

## 2014-09-11 ENCOUNTER — Ambulatory Visit (INDEPENDENT_AMBULATORY_CARE_PROVIDER_SITE_OTHER): Payer: Medicare Other | Admitting: Cardiology

## 2014-09-11 VITALS — BP 140/62 | HR 64 | Ht 59.5 in | Wt 182.0 lb

## 2014-09-11 DIAGNOSIS — R0789 Other chest pain: Secondary | ICD-10-CM

## 2014-09-11 DIAGNOSIS — I110 Hypertensive heart disease with heart failure: Secondary | ICD-10-CM

## 2014-09-11 DIAGNOSIS — R7989 Other specified abnormal findings of blood chemistry: Secondary | ICD-10-CM | POA: Diagnosis not present

## 2014-09-11 DIAGNOSIS — R778 Other specified abnormalities of plasma proteins: Secondary | ICD-10-CM

## 2014-09-11 DIAGNOSIS — I119 Hypertensive heart disease without heart failure: Secondary | ICD-10-CM

## 2014-09-11 DIAGNOSIS — I10 Essential (primary) hypertension: Secondary | ICD-10-CM | POA: Diagnosis not present

## 2014-09-11 HISTORY — DX: Hypertensive heart disease with heart failure: I11.0

## 2014-09-11 NOTE — Progress Notes (Signed)
Cardiology Office Note   Date:  09/11/2014   ID:  Kimberly [name], DOB 10-May-1941, MRN [id number]  PCP:  Beverley Fiedler, MD    Chief Complaint  Patient presents with  . Hypertension  . elevated troponin      History of Present Illness: Kimberly [name] is a 74 y.o. female who presents for evaluation CHF.  She has a history of longstanding HTN and apparently she had been under a lot of stress with her husband saying he was sick and a recent trip [name] due to her brother in law dying.  She starting having severe headaches.  She did not check her BP because she left her cuff at home.  She became nauseated and started spitting up phlegm.  She went to urgent care and was sent to [facility].  The patient was admitted to [facility] with complaints of headache and mild SOB.  She was found to have malignant HTN.  She was treated with a Cardene gtt and then started on Coreg but did not tolerate this due to bradycardia.  She had a mildly elevated troponin that was felt to be due to demand ischemia.  She says that she did have some mild chest pressure but not severe.  EKG showed no acute changes.  2D echo showed normal LVF and grade II DD with LVH c/w hypertensive heart disease.  She was continued on her ACE I and Lasix home doses.  She did not appear to be in CHF.   Since going home she has had 2 episodes of chest heaviness and her SBP was . She says that she does get anxious and is under a lot of stress.  She occasionally has some SOB.  She uses CPAP at night and cannot flat to breath unless she is wearing that.  She has chronic LE edema which is stable.  She occasionally has some palpitations which are sporadic and it will take off racing.  She had a cath in 2010 due to CP showing normal coronary arteries with elevated LVEDP and felt to be due to diastolic CHF vs. Microvascular angina.  Her CP resolved for a while and now has reoccurred.      Past Medical History  Diagnosis Date  . Acid reflux   . HOH  (hard of hearing)     wears hearing aid in left ear  . Sleep apnea     CPAP at bedtime  . Shortness of breath     SOB if laid flat  . PONV (postoperative nausea and vomiting)   . Heart murmur   . Frequency of urination   . Hypercholesteremia   . Anxiety   . Mental disorder   . Depression   . Arthritis   . Insomnia   . Back spasm   . Fuch's endothelial dystrophy     bilateral eyes  . Elevated troponin 09/11/2014  . Hypertensive heart disease 09/11/2014  . Benign essential HTN 09/11/2014  . Normal coronary arteries 2010    cath with no obstructive epicardial disease.  Done for CP which was felt to be due to microvascular angina or diastolic HF    Past Surgical History  Procedure Laterality Date  . Cesarean section      x 2  . Cholecystectomy    . Abdominal hysterectomy    . Appendectomy    . Rotator cuff repair Left   . Eye surgery Right     implant  . Colonoscopy    .  Cardiac catheterization      no CAD  . Shoulder arthroscopy with subacromial decompression and bicep tendon repair Left 08/24/2012    Procedure: SHOULDER ARTHROSCOPY WITH SUBACROMIAL DECOMPRESSION AND BICEP TENDON REPAIR;  Surgeon: Cammy CopaGregory Scott Dean, MD;  [facility];  Service: Orthopedics;  Laterality: Left;  Left Shoulder Arthroscopy, Debridement, Biceps Tenotomy     Current Outpatient Prescriptions  Medication Sig Dispense Refill  . amLODipine (NORVASC) 10 MG tablet Take 20 mg by mouth daily.    Marland Kitchen. aspirin 81 MG chewable tablet Chew 81 mg by mouth daily.    Marland Kitchen. atorvastatin (LIPITOR) 40 MG tablet Take 40 mg by mouth daily.    . fenofibrate micronized (LOFIBRA) 200 MG capsule Take 200 mg by mouth daily before breakfast.    . furosemide (LASIX) 20 MG tablet Take 20 mg by mouth 2 (two) times daily.    Marland Kitchen. lisinopril (PRINIVIL,ZESTRIL) 20 MG tablet Take 20 mg by mouth daily.    . nitroGLYCERIN (NITROSTAT) 0.4 MG SL tablet Place 0.4 mg under the tongue every 5 (five) minutes as needed for chest pain.     . ONE  TOUCH ULTRA TEST test strip     . ONETOUCH DELICA LANCETS 33G MISC     . PARoxetine (PAXIL) 20 MG tablet Take 60 mg by mouth every morning.    . traMADol (ULTRAM) 50 MG tablet Take 100 mg by mouth daily as needed for moderate pain.     Marland Kitchen. zolpidem (AMBIEN) 10 MG tablet Take 5 mg by mouth at bedtime. For sleep    . butalbital-acetaminophen-caffeine (FIORICET, ESGIC) 50-325-40 MG per tablet Take 1-2 tablets by mouth as needed. For headache     No current facility-administered medications for this visit.    Allergies:   Percocet; Codeine; Other; and Phenergan    Social History:  The patient  reports that she has never smoked. She does not have any smokeless tobacco history on file. She reports that she does not drink alcohol or use illicit drugs.   Family History:  The patient's family history includes Cancer in her sister; Stomach cancer in her mother.    ROS:  Please see the history of present illness.   Otherwise, review of systems are positive for none.   All other systems are reviewed and negative.    PHYSICAL EXAM: VS:  BP 140/62 mmHg  Pulse 64  Ht 4' 11.5" (1.511 m)  Wt 182 lb (82.555 kg)  BMI 36.16 kg/m2  SpO2 96% , BMI Body mass index is 36.16 kg/(m^2). GEN: Well nourished, well developed, in no acute distress HEENT: normal Neck: no JVD, carotid bruits, or masses Cardiac: RRR; no murmurs, rubs, or gallops,no edema  Respiratory:  clear to auscultation bilaterally, normal work of breathing GI: soft, nontender, nondistended, + BS MS: no deformity or atrophy Skin: warm and dry, no rash Neuro:  Strength and sensation are intact Psych: euthymic mood, full affect   EKG:  EKG is not ordered today.    Recent Labs: 08/25/2014: ALT 20; B Natriuretic Peptide 149.5*; TSH 1.128 08/26/2014: BUN 20; Creatinine 0.88; Hemoglobin 11.1*; Platelets 225; Potassium 4.3; Sodium 140    Lipid Panel No results found for: CHOL, TRIG, HDL, CHOLHDL, VLDL, LDLCALC, LDLDIRECT    Wt Readings  from Last 3 Encounters:  09/11/14 182 lb (82.555 kg)  08/25/14 184 lb 4.8 oz (83.598 kg)  05/17/14 190 lb 9.6 oz (86.456 kg)        ASSESSMENT AND PLAN:  1.  Hypertensive  heart disease with LVH and diastolic dysfunction on echo.  She does not appear volume overloaded on exam.  Continue diuretic and ACE I.  She has not tolerated BB in the past due to bradycardia. 2.  HTN with recent hospitalization for malignant HTN.  Controlled on amlodipine/ACE I.  I think her HTN is driven by significant anxiety.  It is well controlled today.  I have asked her to check her BP daily for a week and call with the results.  3.  Elevated troponin in setting of #2 most likely due to demand ischemia but she does have CRF including obesity, post menopausal state and HTN.  I will get a Lexiscan myoview to rule out ischemia 4.  Chest pain which I suspect is due to poorly controlled HTN with demand ischemia but need to rule out ischemia due to obstructive CAD.  I will get a Lexiscan myoview.  She has a history of cath in 2010 with normal coronary arteries and nondominant RCA.  This was done for CP and it was felt that etiology of CP at that time was due to microvascular disease or diastolic HF.   5.  Anxiety - I think this is a large component in her swings of in BP.  I think she needs to be treated for anxiety which may help with wide swings in BP.  I will refer her back to PCP for evaluation of this.     Current medicines are reviewed at length with the patient today.  The patient does not have concerns regarding medicines.  The following changes have been made:  no change  Labs/ tests ordered today include: see above assessment and plan No orders of the defined types were placed in this encounter.     Disposition:   FU with me in 6 months   Signed, Quintella Reichert, MD  09/11/2014 1:24 PM    [facility] [address] [place], [place], [place]  [address] Phone: [phone]; Fax: [phone]

## 2014-09-11 NOTE — Patient Instructions (Signed)
Medication Instructions:  Your physician recommends that you continue on your current medications as directed. Please refer to the Current Medication list given to you today.   Labwork: None  Testing/Procedures: Your physician has requested that you have a lexiscan myoview. For further information please visit [email or web address]. Please follow instruction sheet, as given.  Follow-Up: Your physician wants you to follow-up in: 6 months with Dr. Turner. You will receive a reminder letter in the mail two months in advance. If you don't receive a letter, please call our office to schedule the follow-up appointment.   Any Other Special Instructions Will Be Listed Below (If Applicable).   

## 2014-09-19 ENCOUNTER — Telehealth: Payer: Self-pay | Admitting: Cardiology

## 2014-09-19 NOTE — Telephone Encounter (Signed)
Instructed patient to hold her Fioricet and Lasix the day of her stress test. Patient agrees with treatment plan and is grateful for callback.

## 2014-09-19 NOTE — Telephone Encounter (Signed)
Pt c/o medication issue:  1. Name of Medication: Fioncet  2. How are you currently taking this medication (dosage and times per day)?   3. Are you having a reaction (difficulty breathing--STAT)?   4. What is your medication issue?   Pt has on a form for direction for the stress test on 09/21/2014. Do not take Fioncet. Pt states that she has never taken the medication before and she is not sure what it is . Please call to discuss

## 2014-09-19 NOTE — Telephone Encounter (Signed)
Left message to call back  

## 2014-09-19 NOTE — Telephone Encounter (Signed)
Follow Up ° °Pt returning call.  °

## 2014-09-20 ENCOUNTER — Telehealth (HOSPITAL_COMMUNITY): Payer: Self-pay

## 2014-09-20 NOTE — Telephone Encounter (Signed)
Patient's husband (George)was given detailed instructions per Myocardial Perfusion Study Information Sheet for test on 09-21-2014 at 7:15am. Patient's husband verbalized understanding. Randa EvensEdwards, Jesten Cappuccio A

## 2014-09-21 ENCOUNTER — Ambulatory Visit (HOSPITAL_COMMUNITY): Payer: Medicare Other | Attending: Cardiology

## 2014-09-21 DIAGNOSIS — I10 Essential (primary) hypertension: Secondary | ICD-10-CM | POA: Diagnosis not present

## 2014-09-21 DIAGNOSIS — R0609 Other forms of dyspnea: Secondary | ICD-10-CM | POA: Insufficient documentation

## 2014-09-21 DIAGNOSIS — R0789 Other chest pain: Secondary | ICD-10-CM | POA: Diagnosis present

## 2014-09-21 LAB — MYOCARDIAL PERFUSION IMAGING
CHL CUP STRESS STAGE 1 DBP: 64 mmHg
CHL CUP STRESS STAGE 1 GRADE: 0 %
CHL CUP STRESS STAGE 1 SBP: 169 mmHg
CHL CUP STRESS STAGE 1 SPEED: 0 mph
CHL CUP STRESS STAGE 3 GRADE: 0 %
CHL CUP STRESS STAGE 3 HR: 73 {beats}/min
CHL CUP STRESS STAGE 3 SPEED: 0 mph
CHL CUP STRESS STAGE 4 DBP: 53 mmHg
CHL CUP STRESS STAGE 4 SBP: 164 mmHg
CHL CUP STRESS STAGE 5 SBP: 162 mmHg
CHL CUP STRESS STAGE 5 SPEED: 0 mph
CHL CUP STRESS STAGE 6 HR: 75 {beats}/min
CHL CUP STRESS STAGE 6 SPEED: 0 mph
CSEPPMHR: 54 %
Estimated workload: 1 METS
LHR: 0.25
LVDIAVOL: 95 mL
LVSYSVOL: 28 mL
NUC STRESS EF: 70 %
NUC STRESS TID: 1
Peak BP: 164 mmHg
Peak HR: 80 {beats}/min
Rest HR: 67 {beats}/min
SDS: 3
SRS: 0
SSS: 3
Stage 1 HR: 68 {beats}/min
Stage 2 Grade: 0 %
Stage 2 HR: 68 {beats}/min
Stage 2 Speed: 0 mph
Stage 4 Grade: 0 %
Stage 4 HR: 80 {beats}/min
Stage 4 Speed: 0 mph
Stage 5 DBP: 51 mmHg
Stage 5 Grade: 0 %
Stage 5 HR: 81 {beats}/min
Stage 6 DBP: 53 mmHg
Stage 6 Grade: 0 %
Stage 6 SBP: 155 mmHg

## 2014-09-21 MED ORDER — TECHNETIUM TC 99M SESTAMIBI GENERIC - CARDIOLITE
33.0000 | Freq: Once | INTRAVENOUS | Status: AC | PRN
  Administered 2014-09-21: 33 via INTRAVENOUS

## 2014-09-21 MED ORDER — TECHNETIUM TC 99M SESTAMIBI GENERIC - CARDIOLITE
11.0000 | Freq: Once | INTRAVENOUS | Status: AC | PRN
  Administered 2014-09-21: 11 via INTRAVENOUS

## 2014-09-21 MED ORDER — REGADENOSON 0.4 MG/5ML IV SOLN
0.4000 mg | Freq: Once | INTRAVENOUS | Status: AC
  Administered 2014-09-21: 0.4 mg via INTRAVENOUS

## 2014-09-25 ENCOUNTER — Encounter: Payer: Self-pay | Admitting: Cardiology

## 2014-09-25 NOTE — Telephone Encounter (Signed)
New message      Returning Kimberly [name]'s call to get stress test results

## 2014-09-25 NOTE — Telephone Encounter (Signed)
This encounter was created in error - please disregard.

## 2014-10-13 ENCOUNTER — Other Ambulatory Visit: Payer: Self-pay

## 2014-10-13 DIAGNOSIS — Z1231 Encounter for screening mammogram for malignant neoplasm of breast: Secondary | ICD-10-CM

## 2014-10-23 ENCOUNTER — Ambulatory Visit: Payer: Medicare Other

## 2014-10-25 ENCOUNTER — Ambulatory Visit
Admission: RE | Admit: 2014-10-25 | Discharge: 2014-10-25 | Disposition: A | Discharge status: Home / Self Care | Payer: Medicare Other | Source: Ambulatory Visit

## 2014-10-25 DIAGNOSIS — Z1231 Encounter for screening mammogram for malignant neoplasm of breast: Secondary | ICD-10-CM

## 2014-12-14 ENCOUNTER — Encounter: Payer: Self-pay | Admitting: Cardiology

## 2015-02-08 ENCOUNTER — Ambulatory Visit (INDEPENDENT_AMBULATORY_CARE_PROVIDER_SITE_OTHER): Payer: Medicare Other | Admitting: Internal Medicine

## 2015-02-08 LAB — VITAMIN D 25 HYDROXY (VIT D DEFICIENCY, FRACTURES): VITD: 28.65 ng/mL — ABNORMAL LOW (ref 30.00–100.00)

## 2015-02-08 NOTE — Patient Instructions (Signed)
Please stop at the lab.  Stay hydrated.  Please come back for a follow-up appointment in 6 months

## 2015-02-08 NOTE — Progress Notes (Addendum)
Patient ID: Kimberly [name], female   DOB: [birth year], 74 y.o.   MRN: [id number]   HPI  Kimberly [name] is a 74 y.o.-year-old female, returning for f/u for hypercalcemia, dx 2012. Last OV 1 year and 2 mo ago.  Patient has a history of elevated calcium with suppressed parathyroid hormone levels.   I reviewed pt's pertinent labs: 12/25/2014: Calcium 10.8 (8.6-10.3) Lab Results  Component Value Date   PTH 17 12/09/2013   PTH Comment 12/09/2013   PTH 14.9 08/24/2012   CALCIUM 9.7 08/26/2014   CALCIUM 10.0 08/25/2014   CALCIUM 10.3 08/24/2014   CALCIUM 10.9* 12/09/2013   CALCIUM 10.7* 08/24/2012   CALCIUM 11.1* 08/20/2012   CALCIUM 11.1* 10/17/2011   CALCIUM 10.6* 07/26/2010   CALCIUM 10.1 08/11/2007   CALCIUM 10.4 11/11/2006  09/12/2013: Ca 10.8, iCa 5.7 (4.5-5.6)  Pt is not on HCTZ, but was on it in the past >> stopped 09/2013. Now on Lasix. Calcium levels normalized after stopping HCTZ, however, recent labs from PCP show again an elevated calcium as shown above.  Reviewed labs obtained after last visit: Component     Latest Ref Rng 12/09/2013  Vitamin D 1, 25 (OH) Total     18 - 72 pg/mL 79 (H)  Vitamin D3 1, 25 (OH)      79  Vitamin D2 1, 25 (OH)      <8  VITD     30.00 - 100.00 ng/mL 50.59  PTH-Related Protein (PTH-RP)     14 - 27 pg/mL 16  Phosphorus     2.3 - 4.6 mg/dL 3.2   Urine calcium was high: Component     Latest Ref Rng 01/20/2014  Calcium, Ur      14  Calcium, 24 hour urine     100 - 250 mg/day 322 (H)  Creatinine, Urine      51.9  Creatinine, 24H Ur     700 - 1800 mg/day 1194   No osteoporosis, no fractures.  I reviewed pt's previous DEXA scan - 2008: LUMBAR SPINE (L1-L3)  BMD: 1.273  T-score (% of young adult value): 2.3  Z-score (% adult age match value): 4.1  LEFT HIP (NECK)  BMD: 0.831  T-score (% of young adult value): -0.2  Z-score (% adult age match value): 1.4  Assessment: This patient is considered normal according to [facility]) criteria.   No h/o kidney stones.  No h/o CKD. Last BUN/Cr: Lab Results  Component Value Date   BUN 20 08/26/2014   CREATININE 0.88 08/26/2014   No h/o vitamin D deficiency. Pt was on vitamin D 5000 IU daily - she stopped in 10/2013 (Dr Clover Mealy). Not on vit D supplement.  She also has a history of HTN; HL; DM2; CKD - sees Dr Clover Mealy.  ROS: Constitutional: + weight gain, + fatigue, no subjective hyperthermia/hypothermia Eyes:+  blurry vision, no xerophthalmia ENT: no sore throat, no nodules palpated in throat, no dysphagia/odynophagia, no hoarseness; + hypoacusis Cardiovascular: no CP/SOB/palpitations/leg swelling Respiratory: no cough/SOB Gastrointestinal: no N/V/+ D/+ C Musculoskeletal: no muscle/+ joint aches (back pain) Skin: no rashes Neurological: no tremors/numbness/tingling/dizziness, occasional HA - intense HA in 08/2014: very high HA  I reviewed pt's medications, allergies, PMH, social hx, family hx, and changes were documented in the history of present illness. Otherwise, unchanged from my initial visit note.  Past Medical History  Diagnosis Date  . Acid reflux   . HOH (hard of hearing)     wears hearing  aid in left ear  . Sleep apnea     CPAP at bedtime  . Shortness of breath     SOB if laid flat  . PONV (postoperative nausea and vomiting)   . Heart murmur   . Frequency of urination   . Hypercholesteremia   . Anxiety   . Mental disorder   . Depression   . Arthritis   . Insomnia   . Back spasm   . Fuch's endothelial dystrophy     bilateral eyes  . Elevated troponin 09/11/2014  . Hypertensive heart disease 09/11/2014  . Benign essential HTN 09/11/2014  . Normal coronary arteries 2010    cath with no obstructive epicardial disease.  Done for CP which was felt to be due to microvascular angina or diastolic HF   Past Surgical History  Procedure Laterality Date  . Cesarean section      x 2  . Cholecystectomy    . Abdominal hysterectomy     . Appendectomy    . Rotator cuff repair Left   . Eye surgery Right     implant  . Colonoscopy    . Cardiac catheterization      no CAD  . Shoulder arthroscopy with subacromial decompression and bicep tendon repair Left 08/24/2012    Procedure: SHOULDER ARTHROSCOPY WITH SUBACROMIAL DECOMPRESSION AND BICEP TENDON REPAIR;  Surgeon: Meredith Pel, MD;  Location: [facility];  Service: Orthopedics;  Laterality: Left;  Left Shoulder Arthroscopy, Debridement, Biceps Tenotomy   History   Social History  . Marital Status: Married    Spouse Name: N/A    Number of Children: 2   Occupational History  . retired-assistant teacher    Social History Main Topics  . Smoking status: Never Smoker   . Smokeless tobacco: Not on file  . Alcohol Use: No  . Drug Use: No   Current Outpatient Prescriptions on File Prior to Visit  Medication Sig Dispense Refill  . amLODipine (NORVASC) 10 MG tablet Take 20 mg by mouth daily.    Marland Kitchen aspirin 81 MG chewable tablet Chew 81 mg by mouth daily.    Marland Kitchen atorvastatin (LIPITOR) 40 MG tablet Take 40 mg by mouth daily.    . butalbital-acetaminophen-caffeine (FIORICET, ESGIC) 50-325-40 MG per tablet Take 1-2 tablets by mouth as needed. For headache    . fenofibrate micronized (LOFIBRA) 200 MG capsule Take 200 mg by mouth daily before breakfast.    . furosemide (LASIX) 20 MG tablet Take 20 mg by mouth 2 (two) times daily.    Marland Kitchen lisinopril (PRINIVIL,ZESTRIL) 20 MG tablet Take 20 mg by mouth daily.    . nitroGLYCERIN (NITROSTAT) 0.4 MG SL tablet Place 0.4 mg under the tongue every 5 (five) minutes as needed for chest pain.     . ONE TOUCH ULTRA TEST test strip     . ONETOUCH DELICA LANCETS 54D MISC     . PARoxetine (PAXIL) 20 MG tablet Take 60 mg by mouth every morning.    . traMADol (ULTRAM) 50 MG tablet Take 100 mg by mouth daily as needed for moderate pain.     Marland Kitchen zolpidem (AMBIEN) 10 MG tablet Take 5 mg by mouth at bedtime. For sleep     No current facility-administered  medications on file prior to visit.   Allergies  Allergen Reactions  . Percocet [Oxycodone-Acetaminophen] Shortness Of Breath and Other (See Comments)    Pt couldn't breathe or catch her breath  . Codeine Other (See Comments)  hyperactivity  . Other Other (See Comments)    Some pain medication given at [facility] for surgery caused hallucinations   . Phenergan [Promethazine Hcl] Other (See Comments)    Hallucinations    Family History  Problem Relation Age of Onset  . Stomach cancer Mother   . Cancer Sister     intestinal   PE: BP 114/62 mmHg  Pulse 71  Temp(Src) 98.6 F (37 C) (Oral)  Resp 12  Wt 193 lb 12.8 oz (87.907 kg)  SpO2 94% Body mass index is 37.85 kg/(m^2). Wt Readings from Last 3 Encounters:  02/08/15 193 lb 12.8 oz (87.907 kg)  09/21/14 182 lb (82.555 kg)  09/11/14 182 lb (82.555 kg)   Constitutional: overweight, in NAD. No kyphosis. Eyes: PERRLA, EOMI, no exophthalmos ENT: moist mucous membranes, no thyromegaly, no cervical lymphadenopathy Cardiovascular: RRR, No MRG, + B pitting edema +3 Respiratory: CTA B Gastrointestinal: abdomen soft, NT, ND, BS+ Musculoskeletal: no deformities, strength intact in all 4 Skin: moist, warm, no rashes Neurological: no tremor with outstretched hands, DTR normal in all 4  Assessment: 1. Hypercalcemia - no hyperparathyroidism  Plan: Patient has had several instances of elevated calcium, with the highest level being at 11.1. Of note, her PTH levels were repeatedly checked and they were appropriately suppressed in the setting of her hypercalcemia, ruling out a parathyroid dysfunction. A parathyroid hormone related protein was normal at last visit. She had a high 1, 25 dihydroxy vitamin D and a normal 25-hydroxy vitamin D at last check. Her phosphorus was normal, but her urinary calcium was high. Last spring, her HCTZ was stopped, and subsequent calcium levels have been normal per review of the chart. However, at last  visit with PCP in 12/2014, her calcium was again high, at 10.8. - No apparent complications from hypercalcemia: no h/o nephrolithiasis, no abdominal pain, bone pain. - At this visit, we discussed to recheck her calcium and parathyroid hormone, along with vitamin D. I will also add multiple myeloma tests and the vitamin A level. If the calcium is normal or slightly high, I would just suggest to follow the levels and not intervene. If the calcium is very high, we may need a referral to surgery and localization studies for primary hyperparathyroidism. - I will wait for the results of the above labs and will discuss with the plan with the patient.  - I will see her back in 6 mo  Labs pending. I will addend the results when they become available. Office Visit on 02/08/2015  Component Date Value Ref Range Status  . Calcium, Ion 02/08/2015 1.37* 1.12 - 1.32 mmol/L Final  . Calcium 02/08/2015 CANCELED   Final-Edited   Comment: No serum received.  Result canceled by the ancillary   . PTH 02/08/2015 21  15 - 65 pg/mL Final  . PTH 02/08/2015 Comment   Final   Comment: Interpretation                 Intact PTH    Calcium                                 (pg/mL)      (mg/dL) Normal                          15 - 65     8.6 - 10.2 Primary Hyperparathyroidism         >  65          >10.2 Secondary Hyperparathyroidism       >65          <10.2 Non-Parathyroid Hypercalcemia       <65          >10.2 Hypoparathyroidism                  <15          < 8.6 Non-Parathyroid Hypocalcemia    15 - 65          < 8.6   . VITD 02/08/2015 28.65* 30.00 - 100.00 ng/mL Final  . IgG (Immunoglobin G), Serum 02/08/2015 764  690 - 1700 mg/dL Final  . IgA 02/08/2015 149  69 - 380 mg/dL Final  . IgM, Serum 02/08/2015 26* 52 - 322 mg/dL Final  . Immunofix Electr Int 02/08/2015 SEE NOTE   Final   Comment: No monoclonal protein identified. Reviewed by Odis Hollingshead, MD, PhD, FCAP (Electronic Signature on File)   . Total  Protein, Serum Electrophores* 02/08/2015 6.7  6.1 - 8.1 g/dL Final  . Albumin ELP 02/08/2015 4.4  3.8 - 4.8 g/dL Final  . Alpha-1-Globulin 02/08/2015 0.3  0.2 - 0.3 g/dL Final  . Alpha-2-Globulin 02/08/2015 0.6  0.5 - 0.9 g/dL Final  . Beta Globulin 02/08/2015 0.5  0.4 - 0.6 g/dL Final  . Beta 2 02/08/2015 0.3  0.2 - 0.5 g/dL Final  . Gamma Globulin 02/08/2015 0.7* 0.8 - 1.7 g/dL Final  . Abnormal Protein Band1 02/08/2015 NOT DET   Final  . SPE Interp. 02/08/2015 SEE NOTE   Final   Comment: Gamma globulins slightly decreased. Reviewed by Odis Hollingshead, MD, PhD, FCAP (Electronic Signature on File)   . COMMENT (PROTEIN ELECTROPHOR) 02/08/2015 SEE NOTE   Final   Comment: --------------- Serum protein electrophoresis is a useful screening procedure in the detection of various pathophysiologic states such as inflammation, gammopathies, protein loss and other dysproteinemias.  Immunofixation electrophoresis (IFE) is a more sensitive technique for the identification of M-proteins found in patients with monoclonal gammopathy of unknown significance (MGUS), amyloidosis, early or treated myeloma or macroglobulinemia, solitary plasmacytoma or extramedullary plasmacytoma.   . Abnormal Protein Band2 02/08/2015 NOT DET   Final  . Abnormal Protein Band3 02/08/2015 NOT DET   Final  . Total Protein, Urine 02/08/2015 62   Final   No established reference range.  . Total Protein, Urine/Day 02/08/2015 NOT CALC  50 - 100 mg/day Final  . Albumin 02/08/2015 74.6   Final  . Alpha-1-Globulin, U 02/08/2015 10.8   Final  . Alpha-2-Globulin, U 02/08/2015 4.4   Final  . Beta Globulin, U 02/08/2015 6.8   Final  . Gamma Globulin, U 02/08/2015 3.4   Final  . Monoclonal Band 1 02/08/2015 NONE DET   Final  . Monoclonal Band 2 02/08/2015 NONE DET   Final  . Interpretation 02/08/2015 SEE NOTE   Final   Comment: Predominately Albumin.  No significant bands identified. Reviewed by Odis Hollingshead, MD, PhD,  FCAP (Electronic Signature on File)   . Vitamin A (Retinoic Acid) 02/08/2015 89  38 - 98 mcg/dL Final   Comment: Vitamin supplementation within 24 hours prior to blood draw may affect the accuracy of the results. This test was developed and its analytical performance characteristics have been determined by [place], [place]. It has not been cleared or approved by the U.S. Food and Drug Administration. This assay has been  validated pursuant to the CLIA regulations and is used for clinical purposes.    Vitamin a normal. Labs do not suggest multiple myeloma. Vitamin D only mildly low now. Calcium level slightly high. PTH in the low normal range.  In the setting of persistently high calcium level, with an unsuppressed PTH, and also in the presence of elevated urinary calcium and high 1, 25 dihydroxy vitamin D, I would suggest referral to surgery - she may need further imaging studies to localize primary hyperparathyroidism.  I will also advise her to restart vitamin D, at least 2000 units daily.  Pt agrees with referral to Sx >> will refer to Dr Harlow Asa.

## 2015-02-09 ENCOUNTER — Encounter: Payer: Self-pay | Admitting: Internal Medicine

## 2015-02-09 LAB — PTH, INTACT AND CALCIUM: PTH: 21 pg/mL (ref 15–65)

## 2015-02-09 LAB — CALCIUM, IONIZED: CALCIUM ION: 1.37 mmol/L — AB (ref 1.12–1.32)

## 2015-02-11 LAB — VITAMIN A: Vitamin A (Retinoic Acid): 89 ug/dL (ref 38–98)

## 2015-02-12 ENCOUNTER — Telehealth: Payer: Self-pay | Admitting: Internal Medicine

## 2015-02-12 LAB — PROTEIN ELECTROPHORESIS, URINE REFLEX
ALPHA-1-GLOBULIN, U: 10.8 %
Albumin: 74.6 %
Alpha-2-Globulin, U: 4.4 %
BETA GLOBULIN, U: 6.8 %
Gamma Globulin, U: 3.4 %
Total Protein, Urine: 62 mg/dL

## 2015-02-12 LAB — SPEP & IFE WITH QIG
Albumin ELP: 4.4 g/dL (ref 3.8–4.8)
Alpha-1-Globulin: 0.3 g/dL (ref 0.2–0.3)
Alpha-2-Globulin: 0.6 g/dL (ref 0.5–0.9)
BETA 2: 0.3 g/dL (ref 0.2–0.5)
Beta Globulin: 0.5 g/dL (ref 0.4–0.6)
Gamma Globulin: 0.7 g/dL — ABNORMAL LOW (ref 0.8–1.7)
IGM, SERUM: 26 mg/dL — AB (ref 52–322)
IgA: 149 mg/dL (ref 69–380)
IgG (Immunoglobin G), Serum: 764 mg/dL (ref 690–1700)
Total Protein, Serum Electrophoresis: 6.7 g/dL (ref 6.1–8.1)

## 2015-02-12 NOTE — Telephone Encounter (Signed)
Please call pt back regarding lab results.

## 2015-02-12 NOTE — Addendum Note (Signed)
Addended by: Carlus Pavlov on: 02/12/2015 05:04 PM   Modules accepted: Orders

## 2015-03-13 ENCOUNTER — Other Ambulatory Visit: Payer: Self-pay | Admitting: Internal Medicine

## 2015-03-19 ENCOUNTER — Other Ambulatory Visit (HOSPITAL_COMMUNITY): Payer: Self-pay | Admitting: Surgery

## 2015-03-19 DIAGNOSIS — E041 Nontoxic single thyroid nodule: Secondary | ICD-10-CM

## 2015-03-20 ENCOUNTER — Encounter: Payer: Self-pay | Admitting: Cardiology

## 2015-03-20 DIAGNOSIS — I5032 Chronic diastolic (congestive) heart failure: Secondary | ICD-10-CM

## 2015-03-20 DIAGNOSIS — I5030 Unspecified diastolic (congestive) heart failure: Secondary | ICD-10-CM | POA: Insufficient documentation

## 2015-03-20 HISTORY — DX: Chronic diastolic (congestive) heart failure: I50.32

## 2015-03-20 NOTE — Progress Notes (Signed)
Cardiology Office Note   Date:  03/21/2015   ID:  Kimberly PlanBonnie C [name], DOB 28-Dec-1940, MRN [id number]  PCP:  Beverley FiedlerANKINS,VICTORIA, MD    Chief Complaint  Patient presents with  . Congestive Heart Failure  . Hypertension      History of Present Illness: Kimberly [name] is a 74 y.o. female who presents for followup of CHF and HTN.  She has OSA and uses CPAP at night. She has chronic LE edema which is stable. She had a cath in 2010 due to CP showing normal coronary arteries with elevated LVEDP  felt to be due to diastolic CHF vs. Microvascular angina. Her CP resolved for a while but reoccurred on last OV and nuclear stress test 09/2014 was normal.  She denies any CP since last OV and no SOB unless she does not use her CPAP at nightly.  She denies any palpitations or syncope.  She has chronic LE edema which is stable.       Past Medical History  Diagnosis Date  . Acid reflux   . HOH (hard of hearing)     wears hearing aid in left ear  . Sleep apnea     CPAP at bedtime  . Shortness of breath     SOB if laid flat  . PONV (postoperative nausea and vomiting)   . Heart murmur   . Frequency of urination   . Hypercholesteremia   . Anxiety   . Mental disorder   . Depression   . Arthritis   . Insomnia   . Back spasm   . Fuch's endothelial dystrophy     bilateral eyes  . Elevated troponin 09/11/2014  . Hypertensive heart disease 09/11/2014  . Benign essential HTN 09/11/2014  . Normal coronary arteries 2010    cath with no obstructive epicardial disease.  Done for CP which was felt to be due to microvascular angina or diastolic HF  . Chronic diastolic heart failure (HCC) 03/20/2015    Past Surgical History  Procedure Laterality Date  . Cesarean section      x 2  . Cholecystectomy    . Abdominal hysterectomy    . Appendectomy    . Rotator cuff repair Left   . Eye surgery Right     implant  . Colonoscopy    . Cardiac catheterization      no CAD  . Shoulder arthroscopy  with subacromial decompression and bicep tendon repair Left 08/24/2012    Procedure: SHOULDER ARTHROSCOPY WITH SUBACROMIAL DECOMPRESSION AND BICEP TENDON REPAIR;  Surgeon: Cammy CopaGregory Scott Dean, MD;  [facility];  Service: Orthopedics;  Laterality: Left;  Left Shoulder Arthroscopy, Debridement, Biceps Tenotomy     Current Outpatient Prescriptions  Medication Sig Dispense Refill  . amLODipine (NORVASC) 10 MG tablet Take 20 mg by mouth daily.    Marland Kitchen. aspirin 81 MG chewable tablet Chew 81 mg by mouth daily.    Marland Kitchen. atorvastatin (LIPITOR) 40 MG tablet Take 40 mg by mouth daily.    . butalbital-acetaminophen-caffeine (FIORICET, ESGIC) 50-325-40 MG per tablet Take 1-2 tablets by mouth as needed. For headache    . clonazePAM (KLONOPIN) 1 MG tablet Take 1 mg by mouth daily.     . fenofibrate micronized (LOFIBRA) 200 MG capsule Take 200 mg by mouth daily before breakfast.    . furosemide (LASIX) 20 MG tablet Take 20 mg by mouth 2 (two) times daily.    .Marland Kitchen  lisinopril (PRINIVIL,ZESTRIL) 20 MG tablet Take 20 mg by mouth daily.    . nitroGLYCERIN (NITROSTAT) 0.4 MG SL tablet Place 0.4 mg under the tongue every 5 (five) minutes as needed for chest pain.     . ONE TOUCH ULTRA TEST test strip     . ONETOUCH DELICA LANCETS 33G MISC     . OVER THE COUNTER MEDICATION Apply 4 drops to eye daily. Soothe XP lubricant eye drops    . PARoxetine (PAXIL) 20 MG tablet Take 60 mg by mouth every morning.    . traMADol (ULTRAM) 50 MG tablet Take 100 mg by mouth daily as needed for moderate pain.     Marland Kitchen zolpidem (AMBIEN) 10 MG tablet Take 5 mg by mouth at bedtime. For sleep     No current facility-administered medications for this visit.    Allergies:   Percocet; Codeine; Other; and Phenergan    Social History:  The patient  reports that she has never smoked. She does not have any smokeless tobacco history on file. She reports that she does not drink alcohol or use illicit drugs.   Family History:  The patient's family  history includes Cancer in her sister; Stomach cancer in her mother.    ROS:  Please see the history of present illness.   Otherwise, review of systems are positive for none.   All other systems are reviewed and negative.    PHYSICAL EXAM: VS:  BP 128/58 mmHg  Pulse 86  Ht 5' (1.524 m)  Wt 188 lb 12.8 oz (85.639 kg)  BMI 36.87 kg/m2  SpO2 96% , BMI Body mass index is 36.87 kg/(m^2). GEN: Well nourished, well developed, in no acute distress HEENT: normal Neck: no JVD, carotid bruits, or masses Cardiac: RRR; no murmurs, rubs, or gallops,no edema  Respiratory:  clear to auscultation bilaterally, normal work of breathing GI: soft, nontender, nondistended, + BS MS: no deformity or atrophy Skin: warm and dry, no rash Neuro:  Strength and sensation are intact Psych: euthymic mood, full affect   EKG:  EKG is not ordered today.    Recent Labs: 08/25/2014: ALT 20; B Natriuretic Peptide 149.5*; TSH 1.128 08/26/2014: BUN 20; Creatinine, Ser 0.88; Hemoglobin 11.1*; Platelets 225; Potassium 4.3; Sodium 140    Lipid Panel No results found for: CHOL, TRIG, HDL, CHOLHDL, VLDL, LDLCALC, LDLDIRECT    Wt Readings from Last 3 Encounters:  03/21/15 188 lb 12.8 oz (85.639 kg)  02/08/15 193 lb 12.8 oz (87.907 kg)  09/21/14 182 lb (82.555 kg)     ASSESSMENT AND PLAN:  1. Hypertensive heart disease with LVH and diastolic dysfunction on echo. She does not appear volume overloaded on exam. Continue diuretic and ACE I. She has not tolerated BB in the past due to bradycardia. 2. HTN - Controlled on amlodipine/ACE I. I think her HTN is driven by significant anxiety. It is well controlled today. 3. Chest pain which I suspect is due to poorly controlled HTN with demand ischemia and Lexiscan myoview showed no ischemia.This has resolved.   She has a history of cath in 2010 with normal coronary arteries and nondominant RCA. Stress test was done for CP and it was felt that etiology of CP at that  time was due to microvascular disease or diastolic HF.  4.  Chronic diastolic CHF - appears euvolemic on exam.  Continue Lasix.     Current medicines are reviewed at length with the patient today.  The patient does not have concerns regarding medicines.  The following changes have been made:  no change  Labs/ tests ordered today: See above Assessment and Plan No orders of the defined types were placed in this encounter.     Disposition:   FU with me in 6 months  Signed, Quintella Reichert, MD  03/21/2015 8:55 AM    [facility] [address] [place], [place], [place]  [address] Phone: [phone]; Fax: [phone]

## 2015-03-21 ENCOUNTER — Encounter: Payer: Self-pay | Admitting: Cardiology

## 2015-03-21 ENCOUNTER — Ambulatory Visit (INDEPENDENT_AMBULATORY_CARE_PROVIDER_SITE_OTHER): Payer: Medicare Other | Admitting: Cardiology

## 2015-03-21 VITALS — BP 128/58 | HR 86 | Ht 60.0 in | Wt 188.8 lb

## 2015-03-21 DIAGNOSIS — Z23 Encounter for immunization: Secondary | ICD-10-CM

## 2015-03-21 DIAGNOSIS — I110 Hypertensive heart disease with heart failure: Secondary | ICD-10-CM

## 2015-03-21 DIAGNOSIS — I10 Essential (primary) hypertension: Secondary | ICD-10-CM | POA: Diagnosis not present

## 2015-03-21 DIAGNOSIS — I5032 Chronic diastolic (congestive) heart failure: Secondary | ICD-10-CM

## 2015-03-21 LAB — BASIC METABOLIC PANEL
BUN: 25 mg/dL (ref 7–25)
CALCIUM: 10.9 mg/dL — AB (ref 8.6–10.4)
CO2: 30 mmol/L (ref 20–31)
CREATININE: 0.94 mg/dL — AB (ref 0.60–0.93)
Chloride: 101 mmol/L (ref 98–110)
Glucose, Bld: 197 mg/dL — ABNORMAL HIGH (ref 65–99)
Potassium: 3.3 mmol/L — ABNORMAL LOW (ref 3.5–5.3)
Sodium: 141 mmol/L (ref 135–146)

## 2015-03-21 NOTE — Patient Instructions (Addendum)

## 2015-03-22 ENCOUNTER — Telehealth: Payer: Self-pay | Admitting: Cardiology

## 2015-03-22 DIAGNOSIS — E876 Hypokalemia: Secondary | ICD-10-CM

## 2015-03-22 MED ORDER — POTASSIUM CHLORIDE CRYS ER 20 MEQ PO TBCR: 20.0000 meq | EXTENDED_RELEASE_TABLET | Freq: Every day | ORAL | Status: DC

## 2015-03-22 NOTE — Telephone Encounter (Signed)
Follow up  ° ° °Patient returning call back to nurse  °

## 2015-03-22 NOTE — Telephone Encounter (Signed)
Informed pt of lab results and new orders per Dr. Mayford Knifeurner.  Pt verbalized understanding and was in agreement with this plan. Labs scheduled for 11/17. See lab results for details.

## 2015-03-29 ENCOUNTER — Other Ambulatory Visit (INDEPENDENT_AMBULATORY_CARE_PROVIDER_SITE_OTHER): Payer: Medicare Other | Admitting: *Deleted

## 2015-03-29 DIAGNOSIS — E876 Hypokalemia: Secondary | ICD-10-CM

## 2015-03-29 LAB — BASIC METABOLIC PANEL
BUN: 27 mg/dL — AB (ref 7–25)
CALCIUM: 10 mg/dL (ref 8.6–10.4)
CO2: 25 mmol/L (ref 20–31)
CREATININE: 0.82 mg/dL (ref 0.60–0.93)
Chloride: 103 mmol/L (ref 98–110)
Glucose, Bld: 213 mg/dL — ABNORMAL HIGH (ref 65–99)
Potassium: 3.6 mmol/L (ref 3.5–5.3)
Sodium: 140 mmol/L (ref 135–146)

## 2015-04-09 ENCOUNTER — Telehealth: Payer: Self-pay | Admitting: Internal Medicine

## 2015-04-09 DIAGNOSIS — G4733 Obstructive sleep apnea (adult) (pediatric): Secondary | ICD-10-CM

## 2015-04-09 NOTE — Telephone Encounter (Signed)
Called spoke with pt. She is needing new supplies. She ordered them through [facility] in the beginning of this month and then never heard from them. She called and was told they could not provide services since they no longer in her area. Pt needs new DME. I have placed order. Pt wants a call after 12pm to advise who we set her up w/. Please advise PCC's thanks

## 2015-04-09 NOTE — Telephone Encounter (Signed)
Orders sent to [facility] Tobe SosSally E Ottinger

## 2015-04-10 ENCOUNTER — Encounter (HOSPITAL_COMMUNITY): Payer: Medicare Other

## 2015-04-10 ENCOUNTER — Encounter (HOSPITAL_COMMUNITY): Admission: RE | Admit: 2015-04-10 | Payer: Medicare Other | Source: Ambulatory Visit

## 2015-04-10 ENCOUNTER — Encounter (HOSPITAL_COMMUNITY)
Admission: RE | Admit: 2015-04-10 | Discharge: 2015-04-10 | Disposition: A | Discharge status: Home / Self Care | Payer: Medicare Other | Source: Ambulatory Visit | Attending: Surgery | Admitting: Surgery

## 2015-04-10 ENCOUNTER — Ambulatory Visit (HOSPITAL_COMMUNITY)
Admission: RE | Admit: 2015-04-10 | Discharge: 2015-04-10 | Disposition: A | Discharge status: Home / Self Care | Payer: Medicare Other | Source: Ambulatory Visit | Attending: Surgery | Admitting: Surgery

## 2015-04-10 DIAGNOSIS — E049 Nontoxic goiter, unspecified: Secondary | ICD-10-CM | POA: Diagnosis not present

## 2015-04-10 DIAGNOSIS — E041 Nontoxic single thyroid nodule: Secondary | ICD-10-CM | POA: Diagnosis present

## 2015-04-10 NOTE — Progress Notes (Signed)
Pt in nuclear medicine states she is short of breath feels like her throat is closing. Pt vitals stable, charted. Procedure cancelled. Pt drinking and eating snack at this time states that she feels some better, would like to leave to get some fresh air. Advise pt in emergency to call 911. Husband at bedside

## 2015-04-12 ENCOUNTER — Telehealth: Payer: Self-pay | Admitting: Internal Medicine

## 2015-04-12 NOTE — Telephone Encounter (Signed)
Called spoke with pt. She went by [facility] today to get CPAP supplies but would not since she has not had a recent face to face visit with us. She had to change to [facility] d/t [facility] no longer accepting her insurance. Pt was upset on the phone. Pt wants ASAP appt. Please advise thanks

## 2015-04-12 NOTE — Telephone Encounter (Signed)
Please call [facility] and see what the problem is. We last saw her within the past year on 05/17/14, and have a pending appointment.  Ok to refill CPAP mask of choice, supplies   for dx OSA

## 2015-04-13 NOTE — Telephone Encounter (Signed)
[phone], pt cb

## 2015-04-13 NOTE — Telephone Encounter (Signed)
Called spoke with pt. appt was scheduled for Monday to see Dr. Maple HudsonYoung. Nothing further needed

## 2015-04-13 NOTE — Telephone Encounter (Signed)
lmomtcb x1 for pt 

## 2015-04-13 NOTE — Telephone Encounter (Signed)
Spoke with pt- aware that we are going to contact [facility] and will send orders for her CPAP supplies. Will call if any issues.   Called [facility] and spoke with Marchelle Folksmanda - states that per Medicare guidelines if the patient is switching DME to a new carrier she will need to have a face-to-face within the last 6 months. Marchelle Folksmanda states that the visit from 05/2014 will not work. The patient will either need to be seen and have documented CPAP compliance or wait until upcoming visit in Feb 2017. They are unable to fill any Rx's until they have an office visit. Will send to Dr Maple HudsonYoung as Lorain ChildesFYI. Please advise where we can work this patient in ASAP. Thanks.

## 2015-04-13 NOTE — Telephone Encounter (Signed)
Ok to offer a held spot next week

## 2015-04-16 ENCOUNTER — Ambulatory Visit (INDEPENDENT_AMBULATORY_CARE_PROVIDER_SITE_OTHER): Payer: Medicare Other | Admitting: Internal Medicine

## 2015-04-16 ENCOUNTER — Encounter: Payer: Self-pay | Admitting: Internal Medicine

## 2015-04-16 VITALS — BP 124/74 | HR 70 | Ht 60.0 in | Wt 188.8 lb

## 2015-04-16 DIAGNOSIS — I5032 Chronic diastolic (congestive) heart failure: Secondary | ICD-10-CM | POA: Diagnosis not present

## 2015-04-16 DIAGNOSIS — G4733 Obstructive sleep apnea (adult) (pediatric): Secondary | ICD-10-CM

## 2015-04-16 NOTE — Patient Instructions (Signed)
Order- DME [facility] to take over current care of CPAP 13, mask of choice, humidifier, supplies, AirView   Dx OSA  Please call as needed  Ok to cancel February appointment

## 2015-04-16 NOTE — Progress Notes (Signed)
08/29/13- [age] yoF never smoker referred courtesy of Dr Benetta SparVictoria [name]-[facility]; CY started patient about 15 years ago on CPAP-DME is [facility]. Now here for sleep medicine consult. We are requesting old records seeking original sleep study.  Old CPAP machine began smoking. Unable to sleep without it. Bedtime 10PM, latency 30-45 minutes, up 9AM. Weight gain 8 lbs in last 2 years. No ENT surgery, lung or heart disease. Treated hypertension.   09/20/13- 73 yoF never smoker followed for obstructive sleep apnea, complicated by GERD FOLLOWS FOR: pt feels like she doesn't get enough air through her cpap.  Wears cpap 13/ [facility] about 8 hours nightly.  Does not breathe well lying down.  Needs a new sleep study per Medicare standards.  11/14/13- 73 yoF never smoker followed for obstructive sleep apnea, complicated by GERD FOLLOWS ZOX:WRUEAFOR:Wears CPAP 13/ [facility] every night for 8-9 about hours; wears if napping as well.  Was able to get replacement machine. Feels better and sleeps well. Using a nasal mask.  05/17/14- 73 yoF never smoker followed for obstructive sleep apnea, complicated by GERD FOLLOW FOR: OSA; wearing CPAP 13/ [facility] every night approx. 9 hours nightly; no concerns Increased sneezing recently related to dry winter air  04/16/2015-74 year old female never smoker followed for OSA, complicated by GERD, dCHF, DM2 FOLLOWS FOR: Pt wears CPAP machine every night for about 9-10 hours. Put order in to change from [facility] to [facility] for CPAP and supplies. CPAP 13/[facility]>> [facility] no longer participates with Medicare and her region so [facility] and is actively needing replacement CPAP supplies. She has remained very compliant and sleeps better with CPAP. No problems recognized.  ROS-see HPI Constitutional:   No-   weight loss, night sweats, fevers, chills, fatigue, lassitude. HEENT:   + headaches, No-difficulty swallowing, tooth/dental problems, sore throat,   +sneezing, itching, ear ache, nasal congestion, post nasal drip,  CV:  No-chest pain, orthopnea, PND, swelling in lower extremities, anasarca, dizziness, +palpitations Resp: +shortness of breath with exertion or at rest.              No-   productive cough,  No non-productive cough,  No- coughing up of blood.              No-   change in color of mucus.  No- wheezing.   Skin: No-   rash or lesions. GI:  No-   heartburn, +indigestion, abdominal pain, nausea, vomiting,  GU:  MS:  No-   joint pain or swelling.   Neuro-     nothing unusual Psych:  No- change in mood or affect. + depression or anxiety.  No memory loss.  OBJ- Physical Exam General- Alert, Oriented, Affect-appropriate, Distress- none acute, + overweight Skin- rash-none, lesions- none, excoriation- none Lymphadenopathy- none Head- atraumatic            Eyes- Gross vision intact, PERRLA, conjunctivae and secretions clear            Ears- +L hearing aid            Nose- Clear, no-Septal dev, mucus, polyps, erosion, perforation             Throat- Mallampati II-III , mucosa clear , drainage- none, tonsils- atrophic Neck- flexible , trachea midline, no stridor , thyroid nl, carotid no bruit Chest - symmetrical excursion , unlabored           Heart/CV- RRR , no murmur , no gallop  ,  no rub, nl s1 s2                           - JVD- none , edema- none, stasis changes- none, varices- none           Lung- clear to P&A, wheeze- none, cough- none , dullness-none, rub- none           Chest wall-  Abd-  Br/ Gen/ Rectal- Not done, not indicated Extrem- cyanosis- none, clubbing, none, atrophy- none, strength- nl Neuro- grossly intact to observation

## 2015-04-16 NOTE — Assessment & Plan Note (Signed)
Without acute E compensation. Managed by cardiology.

## 2015-04-16 NOTE — Assessment & Plan Note (Signed)
Good compliance and control with CPAP 13. She just needs help changing DME because of Medicare rules.  Plan-continuation order to [facility] for CPAP 13, supplies etc.

## 2015-04-18 ENCOUNTER — Encounter (HOSPITAL_COMMUNITY)
Admission: RE | Admit: 2015-04-18 | Discharge: 2015-04-18 | Disposition: A | Discharge status: Home / Self Care | Payer: Medicare Other | Source: Ambulatory Visit | Attending: Surgery | Admitting: Surgery

## 2015-04-18 ENCOUNTER — Encounter: Payer: Self-pay | Admitting: Internal Medicine

## 2015-04-18 ENCOUNTER — Ambulatory Visit (HOSPITAL_COMMUNITY): Payer: Medicare Other

## 2015-04-18 ENCOUNTER — Encounter (HOSPITAL_COMMUNITY): Payer: Medicare Other

## 2015-04-18 DIAGNOSIS — E213 Hyperparathyroidism, unspecified: Secondary | ICD-10-CM | POA: Diagnosis not present

## 2015-04-18 DIAGNOSIS — E041 Nontoxic single thyroid nodule: Secondary | ICD-10-CM | POA: Insufficient documentation

## 2015-04-18 MED ORDER — TECHNETIUM TC 99M SESTAMIBI GENERIC - CARDIOLITE
25.0000 | Freq: Once | INTRAVENOUS | Status: AC | PRN
  Administered 2015-04-18: 25 via INTRAVENOUS

## 2015-04-18 NOTE — Discharge Instructions (Signed)
Thyroid Lobectomy A thyroid lobectomy is a surgery to remove a part or a whole section (lobe) of the thyroid gland. The thyroid gland is a butterfly-shaped gland at the base of your neck. It produces a substance that helps to control certain body processes (thyroid hormone). The thyroid gland has two lobes, one on each side of the windpipe (trachea). The lobes joined together by a section of tissue (thyroid isthmus). You may have a thyroid lobectomy:  To remove a noncancerous (benign) tumor or a cancerous (malignant) tumor of the thyroid.  To control thyroid hormone overproduction (hyperthyroidism). You may have:  A conventional thyroid lobectomy (open lobectomy). This is the most common type. In this procedure, the thyroid gland is removed through one surgical cut (incision) in the neck.  An endoscopic thyroid lobectomy. This procedure is less invasive. It requires several smaller incisions in the neck, chest, or armpit. The surgeon uses a tiny camera and other assistive tools to operate on the thyroid gland. LET [facility] CARE PROVIDER KNOW ABOUT:  Any allergies you have.  All medicines you are taking, including vitamins, herbs, eye drops, creams, and over-the-counter medicines.  Previous problems you or members of your family have had with the use of anesthetics.  Any blood disorders you have.  Previous surgeries you have had.  Medical conditions you have. RISKS AND COMPLICATIONS Generally this is a safe procedure. However, problems can occur and include:  Infection.  Bleeding.  Hoarseness or vocal cord damage.  Difficulty swallowing or eating.  Damage to the glands that control the calcium level in your body (parathyroid glands).  Scar formation.  Overproduction of thyroid hormone.  Temporary breathing difficulties. This is a very rare complication and usually goes away within weeks. BEFORE THE PROCEDURE  Your health care provider will perform a physical exam and  assess your voice for vocal changes.  Ask your health care provider about:  Changing or stopping your regular medicines. This is especially important if you are taking diabetes medicines or blood thinners.  Taking medicines such as aspirin and ibuprofen. These medicines can thin your blood. Do not take these medicines before your procedure if your health care provider instructs you not to.  Follow instructions from your health care provider about eating or drinking restrictions. PROCEDURE You will be given a medicine that makes you go to sleep (general anesthetic). The steps of each type of thyroid lobectomy are listed below: Conventional Thyroid Lobectomy  The surgeon will make an incision just above where your collarbone meets your breastbone.  The muscles in your neck will be separated to access your thyroid gland.  The damaged or diseased part of the gland will be identified and removed.  Your surgeon will find and preserve the nerves of your voice box (larynx) and protect the four parathyroid glands that are located close to the thyroid gland.  The removed part of your thyroid gland will be examined under a microscope.  If no cancer is found, the incision will be closed with stitches (sutures).  If cancer is found, it may be necessary to remove your thyroid gland entirely (total thyroidectomy). Some or all of the lymph nodes in your neck may also need to be removed.  You may need a tube (catheter) at the incision site to drain blood and fluids that accumulate under your skin after the procedure. This may have to stay in place for a day or two after the procedure.  The incision will be closed with stitches (sutures). Endoscopic  Thyroid Lobectomy  The surgeon will make several small incisions in your neck, chest, or armpit.  The surgeon will insert a narrow tube with a light and a camera on the end (endoscope) into an incision.  Surgical instruments will be inserted into other  incisions to remove part or all of your thyroid gland.  You may need a tube (catheter) at the incision site to drain blood and fluids that accumulate under your skin after the procedure. This may have to stay in place for a day or two after the procedure.  The incision will be closed with stitches (sutures). AFTER THE PROCEDURE   You will be taken to a recovery room. Your blood pressure, heart rate, breathing rate, and blood oxygen level will be monitored often until the medicines you were given have worn off.  You might feel some pain in your incision area or areas.  Your throat may be sore.  You may have a blood test to check the level of calcium in your body.  If you had a catheter put in during the procedure, it will usually be removed the next day.  You may be able to start drinking liquids at first. If this does not cause any problems, you will be able to slowly start eating what you usually eat.   This information is not intended to replace advice given to you by your health care provider. Make sure you discuss any questions you have with your health care provider.   Document Released: 05/18/2007 Document Revised: 09/12/2014 Document Reviewed: 09/28/2013 Elsevier Interactive Patient Education 2016 Elsevier Inc.  Thyroid Nodule A thyroid nodule is an isolatedgrowth of thyroid cells that forms a lump in your thyroid gland. The thyroid gland is a butterfly-shaped gland. It is found in the lower front of your neck. This gland sends chemical messengers (hormones) through your blood to all parts of your body. These hormones are important in regulating your body temperature and helping your body to use energy. Thyroid nodules are common. Most are not cancerous (are benign). You may have one nodule or several nodules.  Different types of thyroid nodules include:  Nodules that grow and fill with fluid (thyroid cysts).  Nodules that produce too much thyroid hormone (hot nodules or  hyperthyroid).  Nodules that produce no thyroid hormone (cold nodules or hypothyroid).  Nodules that form from cancer cells (thyroid cancers). CAUSES Usually, the cause of this condition is not known. RISK FACTORS Factors that make this condition more likely to develop include:  Increasing age. Thyroid nodules become more common in people who are older than 73 years of age.  Gender.  Benign thyroid nodules are more common in women.  Cancerous (malignant) thyroid nodules are more common in men.  A family history that includes:  Thyroid nodules.  Pheochromocytoma.  Thyroid carcinoma.  Hyperparathyroidism.  Certain kinds of thyroid diseases, such as Hashimoto thyroiditis.  Lack of iodine.  A history of head and neck radiation, such as from X-rays. SYMPTOMS It is common for this condition to cause no symptoms. If you have symptoms, they may include:  A lump in your lower neck.  Feeling a lump or tickle in your throat.  Pain in your neck, jaw, or ear.  Having trouble swallowing. Hot nodules may cause symptoms that include:  Weight loss.  Warm, flushed skin.  Feeling hot.  Feeling nervous.  A racing heartbeat. Cold nodules may cause symptoms that include:  Weight gain.  Dry skin.  Brittle hair. This may  also occur with hair loss.  Feeling cold.  Fatigue. Thyroid cancer nodules may cause symptoms that include:  Hard nodules that feel stuck to the thyroid gland.  Hoarseness.  Lumps in the glands near your thyroid (lymph nodes). DIAGNOSIS A thyroid nodule may be felt by your health care provider during a physical exam. This condition may also be diagnosed based on your symptoms. You may also have tests, including:  An ultrasound. This may be done to confirm the diagnosis.  A biopsy. This involves taking a sample from the nodule and looking at it under a microscope to see if the nodule is benign.  Blood tests to make sure that your thyroid is  working properly.  Imaging tests such as MRI or CT scan may be done if:  Your nodule is large.  Your nodule is blocking your airway.  Cancer is suspected. TREATMENT Treatment depends on the cause and size of your nodule or nodules. If the nodule is benign, treatment may not be necessary. Your health care provider may monitor the nodule to see if it goes away without treatment. If the nodule continues to grow, is cancerous, or does not go away:  It may need to be drained with a needle.  It may need to be removed with surgery. If you have surgery, part or all of your thyroid gland may need to be removed as well. HOME CARE INSTRUCTIONS  Pay attention to any changes in your nodule.  Take over-the-counter and prescription medicines only as told by your health care provider.  Keep all follow-up visits as told by your health care provider. This is important. SEEK MEDICAL CARE IF:  Your voice changes.  You have trouble swallowing.  You have pain in your neck, ear, or jaw that is getting worse.  Your nodule gets bigger.  Your nodule starts to make it harder for you to breathe. SEEK IMMEDIATE MEDICAL CARE IF:  You have a sudden fever.  You feel very weak.  Your muscles look like they are shrinking (muscle wasting).  You have mood swings.  You feel very restless.  You feel confused.  You are seeing or hearing things that other people do not see or hear (having hallucinations).  You feel suddenly nauseous or throw up.  You suddenly have diarrhea.  You have chest pain.  There is a loss of consciousness.   This information is not intended to replace advice given to you by your health care provider. Make sure you discuss any questions you have with your health care provider.   Document Released: 03/21/2004 Document Revised: 01/17/2015 Document Reviewed: 08/09/2014 Elsevier Interactive Patient Education 2016 Elsevier Inc.  Thyroid Biopsy The thyroid gland is a  butterfly-shaped gland located in the front of the neck. It produces hormones that affect metabolism, growth and development, and body temperature. Thyroid biopsy is a procedure in which small samples of tissue or fluid are removed from the thyroid gland. The samples are then looked at under a microscope to check for abnormalities. This procedure is done to determine the cause of thyroid problems. It may be done to check for infection, cancer, or other thyroid problems. Two methods may be used for a thyroid biopsy. In one method, a thin needle is inserted through the skin and into the thyroid gland. In the other method, an open incision is made through the skin. LET [facility] PROVIDER KNOW ABOUT:   Any allergies you have.  All medicines you are taking, including vitamins, herbs,  eye drops, creams, and over-the-counter medicines.  Previous problems you or members of your family have had with the use of anesthetics.  Any blood disorders you have.  Previous surgeries you have had.  Medical conditions you have. RISKS AND COMPLICATIONS Generally, this is a safe procedure. However, problems can occur and include:  Bleeding from the procedure site.  Infection.  Injury to structures near the thyroid gland. BEFORE THE PROCEDURE   Ask your health care provider about:  Changing or stopping your regular medicines. This is especially important if you are taking diabetes medicines or blood thinners.  Taking medicines such as aspirin and ibuprofen. These medicines can thin your blood. Do not take these medicines before your procedure if your health care provider asks you not to.  Do not eat or drink anything after midnight on the night before the procedure or as directed by your health care provider.  You may have a blood sample taken. PROCEDURE Either of these methods may be used to perform a thyroid biopsy:  Fine needle biopsy. You may be given medicine to help you relax (sedative).  You will be asked to lie on your back with your head tipped backward to extend your neck. An area on your neck will be cleaned. A needle will then be inserted through the skin of your neck. You may be asked to avoid coughing, talking, swallowing, or making sounds during some portions of the procedure. The needle will be withdrawn once the tissue or fluid samples have been removed. Pressure may be applied to your neck to reduce swelling and ensure that bleeding has stopped. The samples will be sent to a lab for examination.  Open biopsy. You will be given medicine to make you sleep (general anesthetic). An incision will be made in your neck. A sample of thyroid tissue will be removed using surgical tools. The tissue sample will be sent for examination. In some cases, the sample may be examined during the biopsy. If that is done and cancer cells are found, some or all of the thyroid gland may be removed. The incision will be closed with stitches. AFTER THE PROCEDURE   Your recovery will be assessed and monitored.  You may have soreness and tenderness at the site of the biopsy. This should go away after a few days.  If you had an open biopsy, you may have a hoarse voice or sore throat for a couple days.  It is your responsibility to get your test results.   This information is not intended to replace advice given to you by your health care provider. Make sure you discuss any questions you have with your health care provider.   Document Released: 02/23/2007 Document Revised: 05/19/2014 Document Reviewed: 07/21/2013 Elsevier Interactive Patient Education [employer]

## 2015-04-25 ENCOUNTER — Other Ambulatory Visit: Payer: Self-pay | Admitting: Surgery

## 2015-04-25 DIAGNOSIS — E041 Nontoxic single thyroid nodule: Secondary | ICD-10-CM

## 2015-05-11 ENCOUNTER — Ambulatory Visit
Admission: RE | Admit: 2015-05-11 | Discharge: 2015-05-11 | Disposition: A | Discharge status: Home / Self Care | Payer: Medicare Other | Source: Ambulatory Visit | Attending: Surgery | Admitting: Surgery

## 2015-05-11 ENCOUNTER — Other Ambulatory Visit: Payer: Self-pay | Admitting: Surgery

## 2015-05-11 DIAGNOSIS — E041 Nontoxic single thyroid nodule: Secondary | ICD-10-CM

## 2015-05-11 MED ORDER — IOPAMIDOL (ISOVUE-300) INJECTION 61%
75.0000 mL | Freq: Once | INTRAVENOUS | Status: AC | PRN
  Administered 2015-05-11: 75 mL via INTRAVENOUS

## 2015-05-31 ENCOUNTER — Ambulatory Visit: Payer: Self-pay | Admitting: Surgery

## 2015-06-08 ENCOUNTER — Encounter (HOSPITAL_COMMUNITY)
Admission: RE | Admit: 2015-06-08 | Discharge: 2015-06-08 | Disposition: A | Discharge status: Home / Self Care | Payer: Medicare Other | Source: Ambulatory Visit | Attending: Surgery | Admitting: Surgery

## 2015-06-08 ENCOUNTER — Encounter (HOSPITAL_COMMUNITY): Payer: Self-pay

## 2015-06-08 ENCOUNTER — Encounter (HOSPITAL_COMMUNITY)
Admission: RE | Admit: 2015-06-08 | Discharge: 2015-06-08 | Disposition: A | Discharge status: Home / Self Care | Payer: Medicare Other | Source: Ambulatory Visit | Attending: Anesthesiology | Admitting: Anesthesiology

## 2015-06-08 DIAGNOSIS — I110 Hypertensive heart disease with heart failure: Secondary | ICD-10-CM | POA: Diagnosis not present

## 2015-06-08 DIAGNOSIS — Z01812 Encounter for preprocedural laboratory examination: Secondary | ICD-10-CM | POA: Insufficient documentation

## 2015-06-08 DIAGNOSIS — Z79899 Other long term (current) drug therapy: Secondary | ICD-10-CM | POA: Insufficient documentation

## 2015-06-08 DIAGNOSIS — K219 Gastro-esophageal reflux disease without esophagitis: Secondary | ICD-10-CM | POA: Insufficient documentation

## 2015-06-08 DIAGNOSIS — E119 Type 2 diabetes mellitus without complications: Secondary | ICD-10-CM | POA: Insufficient documentation

## 2015-06-08 DIAGNOSIS — Z01818 Encounter for other preprocedural examination: Secondary | ICD-10-CM | POA: Insufficient documentation

## 2015-06-08 DIAGNOSIS — E785 Hyperlipidemia, unspecified: Secondary | ICD-10-CM | POA: Insufficient documentation

## 2015-06-08 DIAGNOSIS — G4733 Obstructive sleep apnea (adult) (pediatric): Secondary | ICD-10-CM | POA: Diagnosis not present

## 2015-06-08 DIAGNOSIS — E049 Nontoxic goiter, unspecified: Secondary | ICD-10-CM | POA: Diagnosis not present

## 2015-06-08 DIAGNOSIS — I509 Heart failure, unspecified: Secondary | ICD-10-CM | POA: Insufficient documentation

## 2015-06-08 DIAGNOSIS — Z7982 Long term (current) use of aspirin: Secondary | ICD-10-CM | POA: Diagnosis not present

## 2015-06-08 DIAGNOSIS — I517 Cardiomegaly: Secondary | ICD-10-CM | POA: Insufficient documentation

## 2015-06-08 HISTORY — DX: Headache: R51

## 2015-06-08 HISTORY — DX: Type 2 diabetes mellitus without complications: E11.9

## 2015-06-08 HISTORY — DX: Headache, unspecified: R51.9

## 2015-06-08 LAB — BASIC METABOLIC PANEL
ANION GAP: 12 (ref 5–15)
BUN: 21 mg/dL — AB (ref 6–20)
CALCIUM: 10.8 mg/dL — AB (ref 8.9–10.3)
CO2: 25 mmol/L (ref 22–32)
Chloride: 105 mmol/L (ref 101–111)
Creatinine, Ser: 0.86 mg/dL (ref 0.44–1.00)
GFR calc Af Amer: >60 mL/min (ref >60)
GLUCOSE: 140 mg/dL — AB (ref 65–99)
Potassium: 4.3 mmol/L (ref 3.5–5.1)
SODIUM: 142 mmol/L (ref 135–145)

## 2015-06-08 LAB — CBC
HCT: 35.2 % — ABNORMAL LOW (ref 36.0–46.0)
Hemoglobin: 12.1 g/dL (ref 12.0–15.0)
MCH: 29.9 pg (ref 26.0–34.0)
MCHC: 34.4 g/dL (ref 30.0–36.0)
MCV: 86.9 fL (ref 78.0–100.0)
Platelets: 216 10*3/uL (ref 150–400)
RBC: 4.05 MIL/uL (ref 3.87–5.11)
RDW: 13.5 % (ref 11.5–15.5)
WBC: 6.3 10*3/uL (ref 4.0–10.5)

## 2015-06-08 LAB — GLUCOSE, CAPILLARY: GLUCOSE-CAPILLARY: 209 mg/dL — AB (ref 65–99)

## 2015-06-08 NOTE — Pre-Procedure Instructions (Addendum)
Kimberly [name]  06/08/2015      [id number] - [place] ([place]), [place] - [address] [place] ([place]) [place] [address] Phone: [phone] Fax: [phone]    Your procedure is scheduled on 06/12/15.  Report to [facility] Admitting at [phone] A.M.  Call this number if you have problems the morning of surgery:  [phone]   Remember:  Do not eat food or drink liquids after midnight.  Take these medicines the morning of surgery with A SIP OF WATER amlodipine, pain med if needed, nitro if needed, clonazepam,paxil  STOP all herbel meds, nsaids (aleve,naproxen,advil,ibuprofen) starting Now including vitamins, aspirin  How to Manage Your Diabetes Before Surgery   Why is it important to control my blood sugar before and after surgery?   Improving blood sugar levels before and after surgery helps healing and can limit problems.  A way of improving blood sugar control is eating a healthy diet by:  - Eating less sugar and carbohydrates  - Increasing activity/exercise  - Talk with your doctor about reaching your blood sugar goals  High blood sugars (greater than 180 mg/dL) can raise your risk of infections and slow down your recovery so you will need to focus on controlling your diabetes during the weeks before surgery.  Make sure that the doctor who takes care of your diabetes knows about your planned surgery including the date and location.  How do I manage my blood sugars before surgery?   Check your blood sugar at least 4 times a day, 2 days before surgery to make sure that they are not too high or low.   Check your blood sugar the morning of your surgery when you wake up and every 2               hours until you get to the Short-Stay unit.  If your blood sugar is less than 70 mg/dL, you will need to treat for low blood sugar by:  Treat a low blood sugar (less than 70 mg/dL) with 1/2 cup of clear juice (cranberry or apple), 4 glucose  tablets, OR glucose gel.  Recheck blood sugar in 15 minutes after treatment (to make sure it is greater than 70 mg/dL).  If blood sugar is not greater than 70 mg/dL on re-check, call [phone] for further instructions.   Report your blood sugar to the Short-Stay nurse when you get to Short-Stay.  References:  [facility], 2007 "How to Manage your Diabetes Before and After Surgery        Do not wear jewelry, make-up or nail polish.  Do not wear lotions, powders, or perfumes.  You may wear deodorant.  Do not shave 48 hours prior to surgery.  Men may shave face and neck.  Do not bring valuables to the hospital.  [facility] is not responsible for any belongings or valuables.  Contacts, dentures or bridgework may not be worn into surgery.  Leave your suitcase in the car.  After surgery it may be brought to your room.  For patients admitted to the hospital, discharge time will be determined by your treatment team.  Patients discharged the day of surgery will not be allowed to drive home.   Name and phone number of your driver:   Special instructions:   Special Instructions:  - Preparing for Surgery  Before surgery, you can play an important role.  Because skin is not sterile, your  skin needs to be as free of germs as possible.  You can reduce the number of germs on you skin by washing with CHG (chlorahexidine gluconate) soap before surgery.  CHG is an antiseptic cleaner which kills germs and bonds with the skin to continue killing germs even after washing.  Please DO NOT use if you have an allergy to CHG or antibacterial soaps.  If your skin becomes reddened/irritated stop using the CHG and inform your nurse when you arrive at Short Stay.  Do not shave (including legs and underarms) for at least 48 hours prior to the first CHG shower.  You may shave your face.  Please follow these instructions carefully:   1.  Shower with CHG Soap the night  before surgery and the morning of Surgery.  2.  If you choose to wash your hair, wash your hair first as usual with your normal shampoo.  3.  After you shampoo, rinse your hair and body thoroughly to remove the Shampoo.  4.  Use CHG as you would any other liquid soap.  You can apply chg directly  to the skin and wash gently with scrungie or a clean washcloth.  5.  Apply the CHG Soap to your body ONLY FROM THE NECK DOWN.  Do not use on open wounds or open sores.  Avoid contact with your eyes ears, mouth and genitals (private parts).  Wash genitals (private parts)       with your normal soap.  6.  Wash thoroughly, paying special attention to the area where your surgery will be performed.  7.  Thoroughly rinse your body with warm water from the neck down.  8.  DO NOT shower/wash with your normal soap after using and rinsing off the CHG Soap.  9.  Pat yourself dry with a clean towel.            10.  Wear clean pajamas.            11.  Place clean sheets on your bed the night of your first shower and do not sleep with pets.  Day of Surgery  Do not apply any lotions/deodorants the morning of surgery.  Please wear clean clothes to the hospital/surgery center.  Please read over the following fact sheets that you were given. Pain Booklet, Coughing and Deep Breathing and Surgical Site Infection Prevention

## 2015-06-08 NOTE — Progress Notes (Addendum)
Anesthesia Chart Review:  Pt is a 75 year old female scheduled for total thyroidectomy on 06/12/2015 with Dr. Gerrit Friends.   PMH includes:  CHF, HTN, heart murmur, OSA (CPAP), hyperlipidemia, DM, post-op N/V, GERD. Never smoker. BMI 37. S/p L shoulder arthroplasty 08/24/12.   Called to see pt for anesthesia consult requested by Dr. Gerrit Friends. Pt has large thyroid gland with retrotracheal and substernal extension. It reaches to the level of the aortic arch and impresses upon the posterior aspect of the trachea, narrowing it slightly. Dr. Renold Don reviewed CT images (see report below) and spoke with Dr. Gerrit Friends by telephone. Dr. Gerrit Friends has not consulted with CT surgery about the thoracic aspects of this enlarged thyroid. Dr. Renold Don does not anticipate any anesthesia issues. I let the pt know this.   Medications include: amlodipine, ASA, lipitor, fenofibrate, lasix, lisinopril, potassium.  Preoperative labs reviewed.    Chest x-ray pending.   EKG 08/24/14: sinus rhythm  CT soft tissue neck 05/11/15:  - Heterogeneous enlarged right lobe of thyroid gland measuring 4.8 x 4.7 x 5.9 cm with retrotracheal and substernal extension (reaching level of the aortic arch). This impresses upon the posterior aspect of the trachea which is slightly narrowed. Esophagus displaced to the left and narrowed. Substernal extension with insinuation between surrounding vascular structures which are slightly displaced. Right lobe with thyroid substernal extension impresses upon the anterior margin of T1 through T4. Heterogeneous appearance of left lower thyroid gland. Both lobes contain calcifications.   Nuclear stress test 09/21/14: Normal lexiscan myovue study with no infarct or ischemia EF 70%  Echo 08/25/14:  - Left ventricle: The cavity size was normal. There was mild concentric hypertrophy. Systolic function was normal. The estimated ejection fraction was in the range of 55% to 60%. Wall motion was normal; there were no  regional wall motion abnormalities. Features are consistent with a pseudonormal left ventricular filling pattern, with concomitant abnormal relaxation and increased filling pressure (grade 2 diastolic dysfunction). - Left atrium: The atrium was mildly dilated. - Pericardium, extracardiac: A trivial pericardial effusion was identified.  Cardiac cath 08/14/08:  1. Significant hypertension with diastolic dysfunction and left ventricular end-diastolic pressure greater than 20. 2. Normal coronary arteries. There is a dominant circumflex and nondominant right coronary. No obstructive disease is noted. 3. Chest pressure is now on the basis of abnormal epicardial coronaries. It could be related to microvascular disease and/or diastolic heart failure.  Rica Mast, FNP-BC [facility]/Anesthesiology Phone: [phone] 06/08/2015 12:53 PM  Addendum: Today's CXR showed: FINDINGS: The lungs are well-expanded. The interstitial markings are coarse but stable. The heart is enlarged. The pulmonary vascularity is mildly prominent centrally but there is minimal if any cephalization of the vascular pattern. There is no pleural effusion. There is mild tortuosity of the descending thoracic aorta. There is multilevel degenerative disc disease of the thoracic spine. IMPRESSION: Chronic bronchitic changes. Stable cardiomegaly with mild central pulmonary vascular congestion. No overt evidence of pulmonary edema.  Velna Ochs [facility]/Anesthesiology Phone [phone] 06/08/2015 3:37 PM

## 2015-06-09 LAB — HEMOGLOBIN A1C
HEMOGLOBIN A1C: 6.4 % — AB (ref 4.8–5.6)
MEAN PLASMA GLUCOSE: 137 mg/dL

## 2015-06-10 ENCOUNTER — Encounter (HOSPITAL_COMMUNITY): Payer: Self-pay | Admitting: Surgery

## 2015-06-10 DIAGNOSIS — E049 Nontoxic goiter, unspecified: Secondary | ICD-10-CM | POA: Diagnosis present

## 2015-06-10 NOTE — H&P (Signed)
General Surgery [facility]  Kimberly [name] DOB: 02/05/1941 Married / Language: English / Race: White Female  History of Present Illness  The patient is a 75 year old female who presents with a complaint of Enlarged thyroid.  Patient returns at my request to review the results of her ultrasound and CT scans. Patient underwent CT scan of the neck and chest on May 11, 2015. No evidence of parathyroid adenoma was identified. However the patient was noted to have an enlarged heterogeneous thyroid gland. The right thyroid lobe had significant substernal extension measuring 5.9 x 4.7 x 4.8 cm. There was compression of the trachea and the esophagus and a shift of the midline structures towards the left. There were bilateral thyroid calcifications. Based on these findings, I asked the patient to come in today with her husband to discuss thyroidectomy. Patient has had a history of dysphagia. She has a history of dyspnea. She has difficulty lying flat at night. She does use CPAP.  Allergies  Percocet *ANALGESICS - OPIOID* Difficulty breathing. Codeine Phosphate *ANALGESICS - OPIOID* Phenergan *ANTIHISTAMINES*  Medication History  TraMADol HCl (  Tablet, Oral) Active. PARoxetine HCl (  Tablet, Oral) Active. Atorvastatin Calcium (  Tablet, Oral) Active. Fenofibrate Micronized (  Capsule, Oral) Active. AmLODIPine Besylate (  Tablet, Oral) Active. Lisinopril (  Tablet, Oral) Active. Zolpidem Tartrate (  Tablet, Oral) Active. Lasix (  Tablet, Oral) Active. Vitamin D (400UNIT Tablet, Oral) Active. Tylenol (  Tablet, Oral) Active. Nitrostat (0.4MG  Tab Sublingual, Sublingual) Active. Medications Reconciled  Vitals  Weight: 191 lb Height: 60in Body Surface Area: 1.83 m Body Mass Index: 37.3 kg/m  Temp.: 24F(Temporal)  Pulse: 76 (Regular)  BP: 144/80 (Sitting, Left Arm, Standard)   Physical Exam    General - appears comfortable, no distress; not diaphorectic  HEENT - normocephalic; sclerae clear, gaze conjugate; mucous membranes moist, dentition good; voice normal  Neck - symmetric on extension; no palpable anterior or posterior cervical adenopathy; palpation with swallowing reveals an enlarged mass extending beneath the right clavicle which is mobile, smooth, and relatively firm.  Chest - clear bilaterally without rhonchi, rales, or wheeze  Cor - regular rhythm with normal rate; no significant murmur  Ext - non-tender without significant edema or lymphedema  Neuro - grossly intact; no tremor   Assessment & Plan  SUBSTERNAL THYROID GOITER (E04.9)  HYPERCALCEMIA  Patient presents today accompanied by her husband to review her diagnostic study results and to discuss my recommendation for thyroid surgery. Patient's calcium levels have returned to normal. Nuclear medicine parathyroid scan was negative for parathyroid adenoma. Improvement has followed discontinuation of her hydrochlorothiazide.  CT scan of the neck reveals an enlarged right thyroid lobe measuring 5.9 cm and extending into the mediastinum to the level of the aortic arch. There is compression of both the trachea and the esophagus. There is a shift of midline structures towards the left. There are calcifications present in both the right and left thyroid lobes. For these reasons, I have recommended total thyroidectomy through a cervical approach. We discussed the procedure at length today. We discussed risk and benefits including the potential for recurrent laryngeal nerve injury and injury to parathyroid glands. We have discussed the hospital stay to be anticipated. We have discussed the need for lifelong thyroid hormone replacement. We have discussed the potential need for radioactive iodine treatment in the event of malignancy. Patient and her husband understand and agree to proceed with surgery in the near future.  The  risks and benefits of the procedure have  been discussed at length with the patient. The patient understands the proposed procedure, potential alternative treatments, and the course of recovery to be expected. All of the patient's questions have been answered at this time. The patient wishes to proceed with surgery.  Velora Heckler, MD, FACS General & Endocrine Surgery [facility] Office: [phone]

## 2015-06-11 MED ORDER — CEFAZOLIN SODIUM-DEXTROSE 2-3 GM-% IV SOLR
2.0000 g | INTRAVENOUS | Status: AC
  Administered 2015-06-12: 2 g via INTRAVENOUS
  Filled 2015-06-11: qty 50

## 2015-06-12 ENCOUNTER — Observation Stay (HOSPITAL_COMMUNITY)
Admission: RE | Admit: 2015-06-12 | Discharge: 2015-06-13 | Disposition: A | Discharge status: Home / Self Care | Payer: Medicare Other | Source: Ambulatory Visit | Attending: Surgery | Admitting: Surgery

## 2015-06-12 ENCOUNTER — Ambulatory Visit (HOSPITAL_COMMUNITY): Payer: Medicare Other | Admitting: Emergency Medicine

## 2015-06-12 ENCOUNTER — Encounter (HOSPITAL_COMMUNITY)
Admission: RE | Disposition: A | Discharge status: Home / Self Care | Payer: Self-pay | Source: Ambulatory Visit | Attending: Surgery

## 2015-06-12 ENCOUNTER — Ambulatory Visit (HOSPITAL_COMMUNITY): Payer: Medicare Other | Admitting: Certified Registered Nurse Anesthetist

## 2015-06-12 ENCOUNTER — Encounter (HOSPITAL_COMMUNITY): Payer: Self-pay | Admitting: *Deleted

## 2015-06-12 DIAGNOSIS — E049 Nontoxic goiter, unspecified: Secondary | ICD-10-CM | POA: Diagnosis present

## 2015-06-12 DIAGNOSIS — K219 Gastro-esophageal reflux disease without esophagitis: Secondary | ICD-10-CM | POA: Diagnosis not present

## 2015-06-12 DIAGNOSIS — E119 Type 2 diabetes mellitus without complications: Secondary | ICD-10-CM | POA: Diagnosis not present

## 2015-06-12 DIAGNOSIS — G4730 Sleep apnea, unspecified: Secondary | ICD-10-CM | POA: Diagnosis not present

## 2015-06-12 DIAGNOSIS — I10 Essential (primary) hypertension: Secondary | ICD-10-CM | POA: Insufficient documentation

## 2015-06-12 DIAGNOSIS — F419 Anxiety disorder, unspecified: Secondary | ICD-10-CM | POA: Insufficient documentation

## 2015-06-12 HISTORY — DX: Dependence on other enabling machines and devices: Z99.89

## 2015-06-12 HISTORY — DX: Obstructive sleep apnea (adult) (pediatric): G47.33

## 2015-06-12 HISTORY — PX: THYROIDECTOMY: SHX17

## 2015-06-12 LAB — GLUCOSE, CAPILLARY
GLUCOSE-CAPILLARY: 115 mg/dL — AB (ref 65–99)
GLUCOSE-CAPILLARY: 96 mg/dL (ref 65–99)
Glucose-Capillary: 131 mg/dL — ABNORMAL HIGH (ref 65–99)
Glucose-Capillary: 141 mg/dL — ABNORMAL HIGH (ref 65–99)
Glucose-Capillary: 239 mg/dL — ABNORMAL HIGH (ref 65–99)

## 2015-06-12 LAB — TYPE AND SCREEN
ABO/RH(D): O POS
Antibody Screen: NEGATIVE

## 2015-06-12 LAB — ABO/RH: ABO/RH(D): O POS

## 2015-06-12 SURGERY — THYROIDECTOMY
Anesthesia: General | Site: Neck

## 2015-06-12 MED ORDER — HYDROMORPHONE HCL 1 MG/ML IJ SOLN: 0.2500 mg | INTRAMUSCULAR | Status: DC | PRN

## 2015-06-12 MED ORDER — PROPOFOL 10 MG/ML IV BOLUS
INTRAVENOUS | Status: DC | PRN
  Administered 2015-06-12: 50 mg via INTRAVENOUS
  Administered 2015-06-12: 150 mg via INTRAVENOUS

## 2015-06-12 MED ORDER — GLYCOPYRROLATE 0.2 MG/ML IJ SOLN
INTRAMUSCULAR | Status: DC | PRN
  Administered 2015-06-12: .4 mg via INTRAVENOUS
  Administered 2015-06-12: .2 mg via INTRAVENOUS

## 2015-06-12 MED ORDER — LIDOCAINE HCL 4 % MT SOLN
OROMUCOSAL | Status: DC | PRN
  Administered 2015-06-12: 4 mL via TOPICAL

## 2015-06-12 MED ORDER — ACETAMINOPHEN 325 MG PO TABS
650.0000 mg | ORAL_TABLET | Freq: Four times a day (QID) | ORAL | Status: DC | PRN
  Administered 2015-06-12: 650 mg via ORAL
  Filled 2015-06-12: qty 2

## 2015-06-12 MED ORDER — MIDAZOLAM HCL 5 MG/5ML IJ SOLN
INTRAMUSCULAR | Status: DC | PRN
  Administered 2015-06-12: 1 mg via INTRAVENOUS

## 2015-06-12 MED ORDER — FENTANYL CITRATE (PF) 100 MCG/2ML IJ SOLN
INTRAMUSCULAR | Status: DC | PRN
  Administered 2015-06-12 (×5): 50 ug via INTRAVENOUS

## 2015-06-12 MED ORDER — HEMOSTATIC AGENTS (NO CHARGE) OPTIME
TOPICAL | Status: DC | PRN
  Administered 2015-06-12: 1 via TOPICAL

## 2015-06-12 MED ORDER — KCL IN DEXTROSE-NACL 20-5-0.45 MEQ/L-%-% IV SOLN
INTRAVENOUS | Status: DC
  Administered 2015-06-12: 18:00:00 via INTRAVENOUS
  Filled 2015-06-12: qty 1000

## 2015-06-12 MED ORDER — NEOSTIGMINE METHYLSULFATE 10 MG/10ML IV SOLN
INTRAVENOUS | Status: DC | PRN
  Administered 2015-06-12: 3 mg via INTRAVENOUS

## 2015-06-12 MED ORDER — DEXAMETHASONE SODIUM PHOSPHATE 4 MG/ML IJ SOLN
INTRAMUSCULAR | Status: DC | PRN
  Administered 2015-06-12: 8 mg via INTRAVENOUS

## 2015-06-12 MED ORDER — 0.9 % SODIUM CHLORIDE (POUR BTL) OPTIME
TOPICAL | Status: DC | PRN
Start: 2015-06-12 — End: 2015-06-12
  Administered 2015-06-12: 1000 mL

## 2015-06-12 MED ORDER — INSULIN ASPART 100 UNIT/ML ~~LOC~~ SOLN
0.0000 [IU] | Freq: Three times a day (TID) | SUBCUTANEOUS | Status: DC
  Administered 2015-06-12 – 2015-06-13 (×2): 2 [IU] via SUBCUTANEOUS

## 2015-06-12 MED ORDER — NEOSTIGMINE METHYLSULFATE 10 MG/10ML IV SOLN
INTRAVENOUS | Status: AC
Start: 2015-06-12 — End: 2015-06-12
  Filled 2015-06-12: qty 1

## 2015-06-12 MED ORDER — SUCCINYLCHOLINE CHLORIDE 20 MG/ML IJ SOLN
INTRAMUSCULAR | Status: DC | PRN
  Administered 2015-06-12: 120 mg via INTRAVENOUS

## 2015-06-12 MED ORDER — GLYCOPYRROLATE 0.2 MG/ML IJ SOLN
INTRAMUSCULAR | Status: AC
Start: 2015-06-12 — End: 2015-06-12
  Filled 2015-06-12: qty 2

## 2015-06-12 MED ORDER — LACTATED RINGERS IV SOLN
INTRAVENOUS | Status: DC
  Administered 2015-06-12 (×2): via INTRAVENOUS

## 2015-06-12 MED ORDER — ONDANSETRON 4 MG PO TBDP: 4.0000 mg | ORAL_TABLET | Freq: Four times a day (QID) | ORAL | Status: DC | PRN

## 2015-06-12 MED ORDER — ONDANSETRON HCL 4 MG/2ML IJ SOLN
INTRAMUSCULAR | Status: AC
  Filled 2015-06-12: qty 2

## 2015-06-12 MED ORDER — NITROGLYCERIN 0.4 MG SL SUBL: 0.4000 mg | SUBLINGUAL_TABLET | SUBLINGUAL | Status: DC | PRN

## 2015-06-12 MED ORDER — AMLODIPINE BESYLATE 10 MG PO TABS
20.0000 mg | ORAL_TABLET | Freq: Every day | ORAL | Status: DC
  Administered 2015-06-13: 20 mg via ORAL
  Filled 2015-06-12 (×3): qty 2

## 2015-06-12 MED ORDER — ONDANSETRON HCL 4 MG/2ML IJ SOLN
INTRAMUSCULAR | Status: DC | PRN
  Administered 2015-06-12: 4 mg via INTRAVENOUS

## 2015-06-12 MED ORDER — MIDAZOLAM HCL 2 MG/2ML IJ SOLN
INTRAMUSCULAR | Status: AC
  Filled 2015-06-12: qty 2

## 2015-06-12 MED ORDER — PAROXETINE HCL 30 MG PO TABS
60.0000 mg | ORAL_TABLET | ORAL | Status: DC
  Administered 2015-06-13: 60 mg via ORAL
  Filled 2015-06-12 (×2): qty 2

## 2015-06-12 MED ORDER — ONDANSETRON HCL 4 MG/2ML IJ SOLN: 4.0000 mg | Freq: Four times a day (QID) | INTRAMUSCULAR | Status: DC | PRN

## 2015-06-12 MED ORDER — CALCIUM CARBONATE 1250 (500 CA) MG PO TABS
2.0000 | ORAL_TABLET | Freq: Three times a day (TID) | ORAL | Status: DC
  Administered 2015-06-12 – 2015-06-13 (×2): 1000 mg via ORAL
  Filled 2015-06-12 (×2): qty 1

## 2015-06-12 MED ORDER — TRAMADOL HCL 50 MG PO TABS
50.0000 mg | ORAL_TABLET | Freq: Four times a day (QID) | ORAL | Status: DC | PRN
  Administered 2015-06-12 (×2): 50 mg via ORAL
  Filled 2015-06-12 (×2): qty 1

## 2015-06-12 MED ORDER — POTASSIUM CHLORIDE CRYS ER 20 MEQ PO TBCR
20.0000 meq | EXTENDED_RELEASE_TABLET | Freq: Every day | ORAL | Status: DC
  Administered 2015-06-12 – 2015-06-13 (×2): 20 meq via ORAL
  Filled 2015-06-12 (×2): qty 1

## 2015-06-12 MED ORDER — LIDOCAINE HCL (CARDIAC) 20 MG/ML IV SOLN
INTRAVENOUS | Status: DC | PRN
  Administered 2015-06-12: 40 mg via INTRAVENOUS

## 2015-06-12 MED ORDER — ZOLPIDEM TARTRATE 5 MG PO TABS
5.0000 mg | ORAL_TABLET | Freq: Every day | ORAL | Status: DC
  Administered 2015-06-12: 5 mg via ORAL
  Filled 2015-06-12: qty 1

## 2015-06-12 MED ORDER — LISINOPRIL 20 MG PO TABS
20.0000 mg | ORAL_TABLET | Freq: Every day | ORAL | Status: DC
  Administered 2015-06-12 – 2015-06-13 (×2): 20 mg via ORAL
  Filled 2015-06-12 (×2): qty 1

## 2015-06-12 MED ORDER — ACETAMINOPHEN 650 MG RE SUPP: 650.0000 mg | Freq: Four times a day (QID) | RECTAL | Status: DC | PRN

## 2015-06-12 MED ORDER — FUROSEMIDE 20 MG PO TABS
20.0000 mg | ORAL_TABLET | Freq: Two times a day (BID) | ORAL | Status: DC
  Administered 2015-06-12 – 2015-06-13 (×2): 20 mg via ORAL
  Filled 2015-06-12 (×2): qty 1

## 2015-06-12 MED ORDER — ROCURONIUM BROMIDE 100 MG/10ML IV SOLN
INTRAVENOUS | Status: DC | PRN
Start: 2015-06-12 — End: 2015-06-12
  Administered 2015-06-12: 30 mg via INTRAVENOUS
  Administered 2015-06-12: 10 mg via INTRAVENOUS

## 2015-06-12 MED ORDER — FENTANYL CITRATE (PF) 250 MCG/5ML IJ SOLN
INTRAMUSCULAR | Status: AC
  Filled 2015-06-12: qty 5

## 2015-06-12 SURGICAL SUPPLY — 57 items
APL SKNCLS STERI-STRIP NONHPOA (GAUZE/BANDAGES/DRESSINGS) ×1
ATTRACTOMAT 16X20 MAGNETIC DRP (DRAPES) ×3 IMPLANT
BENZOIN TINCTURE PRP APPL 2/3 (GAUZE/BANDAGES/DRESSINGS) ×3 IMPLANT
BLADE SURG 10 STRL SS (BLADE) ×3 IMPLANT
BLADE SURG 15 STRL LF DISP TIS (BLADE) ×1 IMPLANT
BLADE SURG 15 STRL SS (BLADE) ×3
BLADE SURG ROTATE 9660 (MISCELLANEOUS) IMPLANT
CANISTER SUCTION 2500CC (MISCELLANEOUS) ×3 IMPLANT
CHLORAPREP W/TINT 10.5 ML (MISCELLANEOUS) ×3 IMPLANT
CLIP TI MEDIUM 24 (CLIP) ×3 IMPLANT
CLIP TI WIDE RED SMALL 24 (CLIP) ×3 IMPLANT
CLOSURE WOUND 1/2 X4 (GAUZE/BANDAGES/DRESSINGS) ×1
CONT SPEC 4OZ CLIKSEAL STRL BL (MISCELLANEOUS) IMPLANT
COVER SURGICAL LIGHT HANDLE (MISCELLANEOUS) ×3 IMPLANT
CRADLE DONUT ADULT HEAD (MISCELLANEOUS) ×3 IMPLANT
DRAPE LAPAROTOMY T 98X78 PEDS (DRAPES) ×3 IMPLANT
DRAPE UTILITY XL STRL (DRAPES) ×3 IMPLANT
ELECT CAUTERY BLADE 6.4 (BLADE) ×3 IMPLANT
ELECT REM PT RETURN 9FT ADLT (ELECTROSURGICAL) ×3
ELECTRODE REM PT RTRN 9FT ADLT (ELECTROSURGICAL) ×1 IMPLANT
GAUZE SPONGE 4X4 12PLY STRL (GAUZE/BANDAGES/DRESSINGS) ×3 IMPLANT
GAUZE SPONGE 4X4 16PLY XRAY LF (GAUZE/BANDAGES/DRESSINGS) ×5 IMPLANT
GLOVE BIO SURGEON STRL SZ7.5 (GLOVE) ×2 IMPLANT
GLOVE BIOGEL PI IND STRL 6.5 (GLOVE) IMPLANT
GLOVE BIOGEL PI IND STRL 8 (GLOVE) IMPLANT
GLOVE BIOGEL PI INDICATOR 6.5 (GLOVE) ×6
GLOVE BIOGEL PI INDICATOR 8 (GLOVE) ×2
GLOVE ECLIPSE 6.5 STRL STRAW (GLOVE) ×4 IMPLANT
GLOVE SKINSENSE NS SZ6.5 (GLOVE) ×2
GLOVE SKINSENSE STRL SZ6.5 (GLOVE) IMPLANT
GLOVE SURG ORTHO 8.0 STRL STRW (GLOVE) ×3 IMPLANT
GOWN STRL REUS W/ TWL LRG LVL3 (GOWN DISPOSABLE) ×2 IMPLANT
GOWN STRL REUS W/ TWL XL LVL3 (GOWN DISPOSABLE) ×1 IMPLANT
GOWN STRL REUS W/TWL LRG LVL3 (GOWN DISPOSABLE) ×9
GOWN STRL REUS W/TWL XL LVL3 (GOWN DISPOSABLE) ×3
HEMOSTAT SURGICEL 2X4 FIBR (HEMOSTASIS) ×3 IMPLANT
KIT BASIN OR (CUSTOM PROCEDURE TRAY) ×3 IMPLANT
KIT ROOM TURNOVER OR (KITS) ×3 IMPLANT
NS IRRIG 1000ML POUR BTL (IV SOLUTION) ×3 IMPLANT
PACK SURGICAL SETUP 50X90 (CUSTOM PROCEDURE TRAY) ×3 IMPLANT
PAD ARMBOARD 7.5X6 YLW CONV (MISCELLANEOUS) ×3 IMPLANT
PENCIL BUTTON HOLSTER BLD 10FT (ELECTRODE) ×3 IMPLANT
SHEARS HARMONIC 9CM CVD (BLADE) ×3 IMPLANT
SPECIMEN JAR MEDIUM (MISCELLANEOUS) IMPLANT
SPONGE GAUZE 4X4 12PLY STER LF (GAUZE/BANDAGES/DRESSINGS) ×2 IMPLANT
SPONGE INTESTINAL PEANUT (DISPOSABLE) ×3 IMPLANT
STRIP CLOSURE SKIN 1/2X4 (GAUZE/BANDAGES/DRESSINGS) ×2 IMPLANT
SUT MNCRL AB 4-0 PS2 18 (SUTURE) ×3 IMPLANT
SUT SILK 2 0 (SUTURE) ×3
SUT SILK 2-0 18XBRD TIE 12 (SUTURE) ×1 IMPLANT
SUT VIC AB 3-0 SH 18 (SUTURE) ×5 IMPLANT
SYR BULB 3OZ (MISCELLANEOUS) ×3 IMPLANT
TAPE CLOTH SURG 4X10 WHT LF (GAUZE/BANDAGES/DRESSINGS) ×2 IMPLANT
TOWEL OR 17X24 6PK STRL BLUE (TOWEL DISPOSABLE) ×3 IMPLANT
TOWEL OR 17X26 10 PK STRL BLUE (TOWEL DISPOSABLE) ×3 IMPLANT
TUBE CONNECTING 12'X1/4 (SUCTIONS) ×1
TUBE CONNECTING 12X1/4 (SUCTIONS) ×2 IMPLANT

## 2015-06-12 NOTE — Op Note (Signed)
Procedure Note  Pre-operative Diagnosis:  Substernal thyroid goiter with calcifications  Post-operative Diagnosis:  same  Surgeon:  Velora Heckler, MD, FACS  Assistant:  Magnus Ivan, RNFA   Procedure:  Total thyroidectomy  Anesthesia:  General  Estimated Blood Loss:  minimal  Drains: none         Specimen: thyroid to pathology  Indications:  The patient is a 75 year old female who presents with a complaint of Enlarged thyroid. Patient returns at my request to review the results of her ultrasound and CT scans. Patient underwent CT scan of the neck and chest on May 11, 2015. No evidence of parathyroid adenoma was identified. However the patient was noted to have an enlarged heterogeneous thyroid gland. The right thyroid lobe had significant substernal extension measuring 5.9 x 4.7 x 4.8 cm. There was compression of the trachea and the esophagus and a shift of the midline structures towards the left. There were bilateral thyroid calcifications. Based on these findings, I asked the patient to come in today with her husband to discuss thyroidectomy. Patient has had a history of dysphagia. She has a history of dyspnea. She has difficulty lying flat at night. She does use CPAP.  Procedure Details: Procedure was done in OR #12 at the [facility].  The patient was brought to the operating room and placed in a supine position on the operating room table.  Following administration of general anesthesia, the patient was positioned and then prepped and draped in the usual aseptic fashion.  After ascertaining that an adequate level of anesthesia had been achieved, a Kocher incision was made with #15 blade.  Dissection was carried through subcutaneous tissues and platysma. Hemostasis was achieved with the electrocautery.  Skin flaps were elevated cephalad and caudad from the thyroid notch to the sternal notch.  The Mahorner self-retaining retractor was placed for exposure.  Strap  muscles were incised in the midline and dissection was begun on the left side.  Strap muscles were reflected laterally.  Left thyroid lobe was normal in size but multinodular.  The left lobe was gently mobilized with blunt dissection.  Superior pole vessels were dissected out and divided individually between small and medium Ligaclips with the Harmonic scalpel.  The thyroid lobe was rolled anteriorly.  Branches of the inferior thyroid artery were divided between small Ligaclips with the Harmonic scalpel.  Inferior venous tributaries were divided between Ligaclips.  Neither parathyroid gland appeared enlarged.  The recurrent laryngeal nerve was identified and preserved along its course.  The ligament of Allyson Sabal was released with the electrocautery and the gland was mobilized onto the anterior trachea. Isthmus was mobilized across the midline.  There was no pyramidal lobe present.  Dry pack was placed in the left neck.  Next, the right thyroid lobe was gently mobilized with blunt dissection.  Right thyroid lobe was markedly enlarged and extended beneath the clavicle and into the posterior mediastinum.  Superior pole vessels were dissected out and divided between small and medium Ligaclips with the Harmonic scalpel.  Superior parathyroid was identified and was normal.  The inferior portion of the right lobe was markedly enlarged with calcifications and extended into the posterior mediastinum.  It was mobilized with blunt dissection.  A moderate sized colloid cyst was ruptured and evacuated.  The lobe was delivered cephalad out of the chest.  The right thyroid lobe was rolled anteriorly and the branches of the inferior thyroid artery divided between small Ligaclips.  The right recurrent laryngeal  nerve was identified and dissected off the the thyroid capsule for several centimeters along its course.  The nerve was preserved.  The ligament of Allyson Sabal was released with the electrocautery.  The right thyroid lobe was  mobilized onto the anterior trachea and the remainder of the thyroid was dissected off the anterior trachea and the thyroid was completely excised. The entire thyroid gland was submitted to pathology for review.  The neck was irrigated with warm saline.  Fibrillar was placed throughout the operative field.  Strap muscles were reapproximated in the midline with interrupted 3-0 Vicryl sutures.  Platysma was closed with interrupted 3-0 Vicryl sutures.  Skin was closed with a running 4-0 Monocryl subcuticular suture.  Wound was washed and dried and benzoin and steri-strips were applied.  Dry gauze dressing was placed.  The patient was awakened from anesthesia and brought to the recovery room.  The patient tolerated the procedure well.   Velora Heckler, MD, FACS General & Endocrine Surgery [facility] Office: [phone]

## 2015-06-12 NOTE — Transfer of Care (Signed)
Immediate Anesthesia Transfer of Care Note  Patient: Kimberly [name]  Procedure(s) Performed: Procedure(s): TOTAL THYROIDECTOMY (N/A)  Patient Location: PACU  Anesthesia Type:General  Level of Consciousness: awake, alert  and oriented  Airway & Oxygen Therapy: Patient Spontanous Breathing and Patient connected to face mask oxygen  Post-op Assessment: Report given to RN and Post -op Vital signs reviewed and stable  Post vital signs: Reviewed and stable  Last Vitals:  Filed Vitals:   06/12/15 1113  Pulse: 68  Temp: 37.4 C  Resp: 20    Complications: No apparent anesthesia complications

## 2015-06-12 NOTE — Anesthesia Procedure Notes (Signed)
Procedure Name: Intubation Date/Time: 06/12/2015 1:52 PM Performed by: Merdis Delay Pre-anesthesia Checklist: Timeout performed, Patient identified, Emergency Drugs available, Suction available and Patient being monitored Patient Re-evaluated:Patient Re-evaluated prior to inductionOxygen Delivery Method: Circle system utilized Preoxygenation: Pre-oxygenation with 100% oxygen Intubation Type: IV induction and Rapid sequence Laryngoscope Size: Glidescope and 3 Grade View: Grade I Tube size: 7.0 mm Number of attempts: 1 Airway Equipment and Method: LTA kit utilized and Stylet Placement Confirmation: ETT inserted through vocal cords under direct vision,  breath sounds checked- equal and bilateral,  CO2 detector and positive ETCO2 Secured at: 22 cm Tube secured with: Tape Dental Injury: Teeth and Oropharynx as per pre-operative assessment  Difficulty Due To: Difficulty was anticipated

## 2015-06-12 NOTE — Anesthesia Postprocedure Evaluation (Signed)
Anesthesia Post Note  Patient: Kimberly [name]  Procedure(s) Performed: Procedure(s) (LRB): TOTAL THYROIDECTOMY (N/A)  Patient location during evaluation: PACU Anesthesia Type: General Level of consciousness: sedated Pain management: pain level controlled Vital Signs Assessment: post-procedure vital signs reviewed and stable Respiratory status: spontaneous breathing and respiratory function stable Cardiovascular status: stable Anesthetic complications: no    Last Vitals:  Filed Vitals:   06/12/15 1630 06/12/15 1645  BP: 164/51   Pulse: 63 66  Temp: 37.3 C   Resp: 21 20    Last Pain:  Filed Vitals:   06/12/15 1650  PainSc: 0-No pain                 Eleonore Shippee DANIEL

## 2015-06-12 NOTE — Interval H&P Note (Signed)
History and Physical Interval Note:  06/12/2015 1:17 PM  Kimberly [name]  has presented today for surgery, with the diagnosis of Substernal thyroid goiter.  The various methods of treatment have been discussed with the patient and family. After consideration of risks, benefits and other options for treatment, the patient has consented to    Procedure(s): TOTAL THYROIDECTOMY (N/A) as a surgical intervention .    The patient's history has been reviewed, patient examined, no change in status, stable for surgery.  I have reviewed the patient's chart and labs.  Questions were answered to the patient's satisfaction.    Velora Heckler, MD, [facility] Office: [phone]    Lysle Yero Judie Petit

## 2015-06-12 NOTE — Anesthesia Preprocedure Evaluation (Addendum)
Anesthesia Evaluation  Patient identified by MRN, date of birth, ID band Patient awake    Reviewed: Allergy & Precautions, H&P , NPO status , Patient's Chart, lab work & pertinent test results  History of Anesthesia Complications (+) PONV and history of anesthetic complications  Airway Mallampati: III  TM Distance: >3 FB Neck ROM: full  Mouth opening: Limited Mouth Opening  Dental  (+) Dental Advisory Given, Poor Dentition, Loose   Pulmonary sleep apnea and Continuous Positive Airway Pressure Ventilation ,    Pulmonary exam normal        Cardiovascular hypertension,  Rhythm:regular Rate:Normal     Neuro/Psych PSYCHIATRIC DISORDERS Anxiety    GI/Hepatic GERD  ,  Endo/Other  diabetes, Well Controlled, Type 2  Renal/GU      Musculoskeletal   Abdominal   Peds  Hematology   Anesthesia Other Findings   Reproductive/Obstetrics                            Anesthesia Physical  Anesthesia Plan  ASA: III  Anesthesia Plan: General   Post-op Pain Management:    Induction: Intravenous  Airway Management Planned: Oral ETT  Additional Equipment:   Intra-op Plan:   Post-operative Plan: Extubation in OR  Informed Consent: I have reviewed the patients History and Physical, chart, labs and discussed the procedure including the risks, benefits and alternatives for the proposed anesthesia with the patient or authorized representative who has indicated his/her understanding and acceptance.   Dental advisory given  Plan Discussed with: CRNA, Anesthesiologist and Surgeon  Anesthesia Plan Comments:         Anesthesia Quick Evaluation

## 2015-06-13 ENCOUNTER — Encounter (HOSPITAL_COMMUNITY): Payer: Self-pay | Admitting: Surgery

## 2015-06-13 DIAGNOSIS — E049 Nontoxic goiter, unspecified: Secondary | ICD-10-CM | POA: Diagnosis not present

## 2015-06-13 LAB — BASIC METABOLIC PANEL
ANION GAP: 8 (ref 5–15)
BUN: 23 mg/dL — ABNORMAL HIGH (ref 6–20)
CHLORIDE: 104 mmol/L (ref 101–111)
CO2: 26 mmol/L (ref 22–32)
Calcium: 9.8 mg/dL (ref 8.9–10.3)
Creatinine, Ser: 1.22 mg/dL — ABNORMAL HIGH (ref 0.44–1.00)
GFR calc non Af Amer: 43 mL/min — ABNORMAL LOW (ref >60)
GFR, EST AFRICAN AMERICAN: 49 mL/min — AB (ref >60)
Glucose, Bld: 162 mg/dL — ABNORMAL HIGH (ref 65–99)
POTASSIUM: 4.7 mmol/L (ref 3.5–5.1)
SODIUM: 138 mmol/L (ref 135–145)

## 2015-06-13 LAB — GLUCOSE, CAPILLARY: Glucose-Capillary: 146 mg/dL — ABNORMAL HIGH (ref 65–99)

## 2015-06-13 MED ORDER — TRAMADOL HCL 50 MG PO TABS: 50.0000 mg | ORAL_TABLET | Freq: Four times a day (QID) | ORAL | Status: DC | PRN

## 2015-06-13 MED ORDER — SYNTHROID 88 MCG PO TABS: 88.0000 ug | ORAL_TABLET | Freq: Every day | ORAL | Status: DC

## 2015-06-13 MED ORDER — CALCIUM CARBONATE 1250 (500 CA) MG PO TABS: 2.0000 | ORAL_TABLET | Freq: Two times a day (BID) | ORAL | Status: DC

## 2015-06-13 NOTE — Discharge Summary (Signed)
Physician Discharge Summary [facility]  Patient ID: Kimberly [name] MRN: [id number] DOB/AGE: 74-Feb-1942 74 y.o.  Admit date: 06/12/2015 Discharge date: 06/13/2015  Admission Diagnoses:  Substernal thyroid goiter  Discharge Diagnoses:  Principal Problem:   Substernal thyroid goiter Active Problems:   Hypercalcemia   Discharged Condition: good  Hospital Course: Patient was admitted for observation following thyroid surgery.  Post op course was uncomplicated.  Pain was well controlled.  Tolerated diet.  Post op calcium level on morning following surgery was 9.8 mg/dl.  Patient was prepared for discharge home on POD#1.  Consults: None  Treatments: surgery: total thyroidectomy  Discharge Exam: Blood pressure 152/49, pulse 52, temperature 99 F (37.2 C), temperature source Oral, resp. rate 18, height 5' (1.524 m), weight 86.183 kg (190 lb), SpO2 97 %. HEENT - clear Neck - wound dry and intact; mild hoarseness Chest - clear bilaterally Cor - RRR  Disposition: Home  Discharge Instructions    Diet - low sodium heart healthy    Complete by:  As directed      Increase activity slowly    Complete by:  As directed      Remove dressing in 24 hours    Complete by:  As directed             Medication List    TAKE these medications        amLODipine 10 MG tablet  Commonly known as:  NORVASC  Take 20 mg by mouth daily.     aspirin 81 MG chewable tablet  Chew 81 mg by mouth daily.     atorvastatin 80 MG tablet  Commonly known as:  LIPITOR  Take 1 tablet by mouth daily.     butalbital-acetaminophen-caffeine 50-325-40 MG tablet  Commonly known as:  FIORICET, ESGIC  Take 1-2 tablets by mouth as needed for headache or migraine.     calcium carbonate 1250 (500 Ca) MG tablet  Commonly known as:  OS-CAL - dosed in mg of elemental calcium  Take 2 tablets (1,000 mg of elemental calcium total) by mouth 2 (two) times daily with a meal.     clonazePAM 1 MG  tablet  Commonly known as:  KLONOPIN  Take 1 mg by mouth daily. Patient reports that she takes as needed     fenofibrate micronized 200 MG capsule  Commonly known as:  LOFIBRA  Take 200 mg by mouth daily before breakfast.     furosemide 20 MG tablet  Commonly known as:  LASIX  Take 20 mg by mouth 2 (two) times daily.     lisinopril 20 MG tablet  Commonly known as:  PRINIVIL,ZESTRIL  Take 20 mg by mouth daily.     nitroGLYCERIN 0.4 MG SL tablet  Commonly known as:  NITROSTAT  Place 0.4 mg under the tongue every 5 (five) minutes as needed for chest pain.     ONE TOUCH ULTRA TEST test strip  Generic drug:  glucose blood     ONETOUCH DELICA LANCETS 33G Misc     OVER THE COUNTER MEDICATION  Apply 4 drops to eye daily as needed (dry eye). Soothe XP lubricant eye drops     PARoxetine 20 MG tablet  Commonly known as:  PAXIL  Take 60 mg by mouth every morning.     potassium chloride SA 20 MEQ tablet  Commonly known as:  K-DUR,KLOR-CON  Take 1 tablet (20 mEq total) by mouth daily.     SYNTHROID 88 MCG tablet  Generic drug:  levothyroxine  Take 1 tablet (88 mcg total) by mouth daily before breakfast.     traMADol 50 MG tablet  Commonly known as:  ULTRAM  Take 100 mg by mouth daily as needed for moderate pain.     traMADol 50 MG tablet  Commonly known as:  ULTRAM  Take 1-2 tablets (50-100 mg total) by mouth every 6 (six) hours as needed for moderate pain or severe pain.     zolpidem 10 MG tablet  Commonly known as:  AMBIEN  Take 5 mg by mouth at bedtime. For sleep           Follow-up Information    Follow up with Velora Heckler, MD. Schedule an appointment as soon as possible for a visit in 3 weeks.   Specialty:  General Surgery   Why:  For wound re-check   Contact information:   [address] [place] [address] [phone]       Velora Heckler, MD, [facility] Office: [phone]   Signed: Velora Heckler 06/13/2015,  10:56 AM

## 2015-06-13 NOTE — Progress Notes (Signed)
Patient ID: Kimberly [name], female   DOB: 07-05-1940, 75 y.o.   MRN: [id number]  General Surgery [facility]  POD#: 1  Subjective: Patient sleeping, awakens easily, CPAP on.  No complaints.  Mild pain.  Voice with mild hoarseness.  Objective: Vital signs in last 24 hours: Temp:  [98.6 F (37 C)-99.5 F (37.5 C)] 99 F (37.2 C) (02/01 0651) Pulse Rate:  [53-75] 55 (02/01 0651) Resp:  [12-22] 18 (02/01 0651) BP: (143-174)/(47-66) 164/55 mmHg (02/01 0651) SpO2:  [6 %-99 %] 98 % (02/01 0651) Weight:  [86.183 kg (190 lb)] 86.183 kg (190 lb) (01/31 1113) Last BM Date: 06/12/15  Intake/Output from previous day: 01/31 0701 - 02/01 0700 In: 2384.2 [P.O.:340; I.V.:2044.2] Out: 1225 [Urine:1100; Blood:125] Intake/Output this shift:    Physical Exam: HEENT - sclerae clear, mucous membranes moist Neck - wound dry and intact; voice hoarse Chest - clear bilaterally Neuro - alert & oriented, no focal deficits  Lab Results:  No results for input(s): WBC, HGB, HCT, PLT in the last 72 hours. BMET No results for input(s): NA, K, CL, CO2, GLUCOSE, BUN, CREATININE, CALCIUM in the last 72 hours. PT/INR No results for input(s): LABPROT, INR in the last 72 hours. Comprehensive Metabolic Panel:    Component Value Date/Time   NA 142 06/08/2015 1049   NA 140 03/29/2015 1102   K 4.3 06/08/2015 1049   K 3.6 03/29/2015 1102   CL 105 06/08/2015 1049   CL 103 03/29/2015 1102   CO2 25 06/08/2015 1049   CO2 25 03/29/2015 1102   BUN 21* 06/08/2015 1049   BUN 27* 03/29/2015 1102   CREATININE 0.86 06/08/2015 1049   CREATININE 0.82 03/29/2015 1102   CREATININE 0.94* 03/21/2015 0913   CREATININE 0.88 08/26/2014 0455   GLUCOSE 140* 06/08/2015 1049   GLUCOSE 213* 03/29/2015 1102   CALCIUM 10.8* 06/08/2015 1049   CALCIUM 10.0 03/29/2015 1102   CALCIUM 10.7* 08/24/2012 0927   AST 27 08/25/2014 0305   AST 31 08/24/2014 2112   ALT 20 08/25/2014 0305   ALT 23 08/24/2014 2112   ALKPHOS 38* 08/25/2014 0305   ALKPHOS 42 08/24/2014 2112   BILITOT 0.7 08/25/2014 0305   BILITOT 0.8 08/24/2014 2112   PROT 6.5 08/25/2014 0305   PROT 7.5 08/24/2014 2112   ALBUMIN 4.0 08/25/2014 0305   ALBUMIN 4.6 08/24/2014 2112    Studies/Results: No results found.  Assessment & Plans: Status post total thyroidectomy for substernal goiter  Check labs this AM - not yet drawn  Anticipate discharge home later this AM  Follow up at [facility] office 3 weeks  Velora Heckler, MD, [facility] Office: [phone]   Kimberly [name] Judie Petit 06/13/2015

## 2015-06-13 NOTE — Discharge Instructions (Signed)
[facility] ° °THYROID & PARATHYROID SURGERY:  POST-OP INSTRUCTIONS ° °Always review your discharge instruction sheet from the facility where your surgery was performed. ° °A prescription for pain medication may be given to you upon discharge.  Take your pain medication as prescribed.  If narcotic pain medicine is not needed, then you may take acetaminophen (Tylenol) or ibuprofen (Advil) as needed. ° °Take your usually prescribed medications unless otherwise directed. ° °If you need a refill on your pain medication, please contact your pharmacy. They will contact our office to request authorization.  Prescriptions will not be processed by our office after 5 pm or on weekends. ° °Start with a light diet upon arrival home, such as soup and crackers or toast.  Be sure to drink plenty of fluids daily.  Resume your normal diet the day after surgery. ° °Most patients will experience some swelling and bruising on the chest and neck area.  Ice packs will help.  Swelling and bruising can take several days to resolve.  ° °It is common to experience some constipation after surgery.  Increasing fluid intake and taking a stool softener will usually help or prevent this problem.  A mild laxative (Milk of Magnesia or Miralax) should be taken according to package directions if there has been no bowel movement after 48 hours. ° °You have steri-strips and a gauze dressing over your incision.  You may remove the gauze bandage on the second day after surgery, and you may shower at that time.  Leave your steri-strips (small skin tapes) in place directly over the incision.  These strips should remain on the skin for 5-7 days and then be removed.  You may get them wet in the shower and pat them dry. ° °You may resume regular (light) daily activities beginning the next day - such as daily self-care, walking, climbing stairs - gradually increasing activities as tolerated.  You may have sexual intercourse when it is  comfortable.  Refrain from any heavy lifting or straining until approved by your doctor.  You may drive when you no longer are taking prescription pain medication, you can comfortably wear a seatbelt, and you can safely maneuver your car and apply brakes. ° °You should see your doctor in the office for a follow-up appointment approximately two to three weeks after your surgery.  Make sure that you call for this appointment within a day or two after you arrive home to insure a convenient appointment time. ° °WHEN TO CALL YOUR DOCTOR: °-- Fever greater than 101.5 °-- Inability to urinate °-- Nausea and/or vomiting - persistent °-- Extreme swelling or bruising °-- Continued bleeding from incision °-- Increased pain, redness, or drainage from the incision °-- Difficulty swallowing or breathing °-- Muscle cramping or spasms °-- Numbness or tingling in hands or around lips ° °The clinic staff is available to answer your questions during regular business hours.  Please don’t hesitate to call and ask to speak to one of the nurses if you have concerns. ° °Shardee Dieu M. Jupiter Boys, MD, FACS °General & Endocrine Surgery °[facility] °Office: [phone] ° °Website: [email or web address] ° ° °

## 2015-06-13 NOTE — Progress Notes (Signed)
Discussed discharge summary with patient. Reviewed all medications with patient. Patient received Rx. Patient ready for discharge. 

## 2015-06-14 NOTE — Progress Notes (Signed)
Quick Note:  Please contact patient and notify of benign pathology results.  Case Vassell M. Lakesha Levinson, MD, FACS [facility] Office: [phone]   ______ 

## 2015-06-21 ENCOUNTER — Ambulatory Visit: Payer: Medicare Other | Admitting: Internal Medicine

## 2015-08-08 ENCOUNTER — Ambulatory Visit: Payer: Medicare Other | Admitting: Internal Medicine

## 2015-08-15 ENCOUNTER — Encounter: Payer: Self-pay | Admitting: Internal Medicine

## 2015-08-15 NOTE — Progress Notes (Signed)
Received office visit note from Dr. Gerrit FriendsGerkin from 08/14/2015:  Patient had total thyroidectomy for multinodular substernal thyroid goiter in 05/2015. She is on Synthroid 88 g daily. TSH on 07/31/2015 remained elevated at 58.19. She maintained that she was taking her Synthroid in the morning, separated from breakfast and other medicines. Synthroid dose was increased to 112 g daily. Her calcium on 06/13/2015 was normal, at 9.8%. Her calcium supplementation was discontinued then.

## 2015-09-18 ENCOUNTER — Ambulatory Visit (INDEPENDENT_AMBULATORY_CARE_PROVIDER_SITE_OTHER): Payer: Medicare Other | Admitting: Internal Medicine

## 2015-09-18 ENCOUNTER — Other Ambulatory Visit (INDEPENDENT_AMBULATORY_CARE_PROVIDER_SITE_OTHER): Payer: Medicare Other

## 2015-09-18 ENCOUNTER — Encounter: Payer: Self-pay | Admitting: Internal Medicine

## 2015-09-18 VITALS — BP 138/80 | HR 65 | Temp 98.2°F | Resp 12 | Wt 195.0 lb

## 2015-09-18 DIAGNOSIS — E210 Primary hyperparathyroidism: Secondary | ICD-10-CM

## 2015-09-18 DIAGNOSIS — E559 Vitamin D deficiency, unspecified: Secondary | ICD-10-CM

## 2015-09-18 DIAGNOSIS — E890 Postprocedural hypothyroidism: Secondary | ICD-10-CM

## 2015-09-18 DIAGNOSIS — Z9889 Other specified postprocedural states: Secondary | ICD-10-CM | POA: Insufficient documentation

## 2015-09-18 LAB — T4, FREE: Free T4: 0.83 ng/dL (ref 0.60–1.60)

## 2015-09-18 LAB — TSH: TSH: 31.8 u[IU]/mL — ABNORMAL HIGH (ref 0.35–4.50)

## 2015-09-18 LAB — VITAMIN D 25 HYDROXY (VIT D DEFICIENCY, FRACTURES): VITD: 32.67 ng/mL (ref 30.00–100.00)

## 2015-09-18 NOTE — Patient Instructions (Signed)
Please stop at [facility].  Please continue vitamin D 2000 units daily.  Please continue Levothyroxine 112 mcg daily.  Take the thyroid hormone every day, with water, at least 30 minutes before breakfast, separated by at least 4 hours from: - acid reflux medications - calcium - iron - multivitamins  Please come back for a follow-up appointment in 4 months.

## 2015-09-18 NOTE — Progress Notes (Signed)
Patient ID: Kimberly [name], female   DOB: 11/12/1940, 75 y.o.   MRN: [id number]   HPI  Kimberly [name] is a 75 y.o.-year-old female, returning for f/u for hypercalcemia, dx 2012, and new s/p thyroidectomy and postsurgical hypothyroidism. Last OV 6 mo ago.  Primary hyperparathyroidism:  I reviewed pt's pertinent labs: Lab Results  Component Value Date   PTH 21 02/08/2015   PTH Comment 02/08/2015   PTH 17 12/09/2013   PTH Comment 12/09/2013   PTH 14.9 08/24/2012   CALCIUM 9.8 06/13/2015   CALCIUM 10.8* 06/08/2015   CALCIUM 10.0 03/29/2015   CALCIUM 10.9* 03/21/2015   CALCIUM CANCELED 02/08/2015   CALCIUM 9.7 08/26/2014   CALCIUM 10.0 08/25/2014   CALCIUM 10.3 08/24/2014   CALCIUM 10.9* 12/09/2013   CALCIUM 10.7* 08/24/2012  12/25/2014: Calcium 10.8 (8.6-10.3) 09/12/2013: Ca 10.8, iCa 5.7 (4.5-5.6)  Pt is not on HCTZ, but was on it in the past >> stopped 09/2013. Now on Lasix.   Reviewed pertinent labs:  Component     Latest Ref Rng 02/08/2015 02/08/2015         2:14 PM  2:14 PM  PTH     15 - 65 pg/mL  21  Calcium Ionized     1.12 - 1.32 mmol/L 1.37 (H)   VITD     30.00 - 100.00 ng/mL 28.65 (L)   Vitamin A (Retinoic Acid)     38 - 98 mcg/dL 89    Component     Latest Ref Rng 12/09/2013  Vitamin D 1, 25 (OH) Total     18 - 72 pg/mL 79 (H)  Vitamin D3 1, 25 (OH)      79  Vitamin D2 1, 25 (OH)      <8  VITD     30.00 - 100.00 ng/mL 50.59  PTH-Related Protein (PTH-RP)     14 - 27 pg/mL 16  Phosphorus     2.3 - 4.6 mg/dL 3.2   Urine calcium was high: Component     Latest Ref Rng 01/20/2014  Calcium, Ur      14  Calcium, 24 hour urine     100 - 250 mg/day 322 (H)  Creatinine, Urine      51.9  Creatinine, 24H Ur     700 - 1800 mg/day 1194   No osteoporosis, no fractures.  I reviewed pt's previous DEXA scan - 2008: LUMBAR SPINE (L1-L3)  BMD: 1.273  T-score (% of young adult value): 2.3  Z-score (% adult age match value): 4.1  LEFT HIP (NECK)  BMD: 0.831   T-score (% of young adult value): -0.2  Z-score (% adult age match value): 1.4  Assessment: This patient is considered normal according to [facility]) criteria.   No h/o kidney stones.  + h/o CKD. Last BUN/Cr: Lab Results  Component Value Date   BUN 23* 06/13/2015   CREATININE 1.22* 06/13/2015    Since last visit, she has had a technetium sestamibi scan that was negative and a CT scan of her neck that was negative for possible parathyroid nodules, but showed a substernal goiter with Th narrowing.  H/o vitamin D deficiency: - Pt was on vitamin D 5000 IU daily - she stopped in 10/2013 (Dr Clover Mealy) - Last vitamin D: Lab Results  Component Value Date   VD25OH 28.65* 02/08/2015   VD25OH 50.59 12/09/2013  - We started vitamin D 2000 units at last visit, as vit D was low >> she continues this  dose  S/p Thyroidectomy: -  She had thyroidectomy for substernal goiter on 06/12/2015 -  Benign, nodular, pathology  Postsurgical hypothyroidism: -  She started levothyroxine 88 >> 112 g daily since then.  Takes it: - in am - w/ water - stopped calcium in 08/2015 - but she was taking it along with the LT4! - no PPI - no iron, MVI  07/31/2015: TSH 58  Calcium normalized after her surgery: 06/13/2015: 9.8  She also has a history of HTN; HL; DM2; CKD - sees Dr Clover Mealy.  ROS: Constitutional: + weight gain, no fatigue, no subjective hyperthermia/hypothermia Eyes:+  blurry vision, no xerophthalmia ENT: no sore throat, no nodules palpated in throat, no dysphagia/odynophagia, no hoarseness Cardiovascular: no CP/+ SOB/no palpitations/+ leg swelling Respiratory: no cough/+ SOB Gastrointestinal: no N/V/D/+ C Musculoskeletal: + muscle/+ joint aches (back pain) Skin: no rashes Neurological: no tremors/numbness/tingling/dizziness,+ HA's  I reviewed pt's medications, allergies, PMH, social hx, family hx, and changes were documented in the history of present illness.  Otherwise, unchanged from my initial visit note.  Past Medical History  Diagnosis Date  . Acid reflux   . HOH (hard of hearing)     wears hearing aid in left ear  . Shortness of breath     SOB if laid flat  . PONV (postoperative nausea and vomiting)   . Heart murmur   . Frequency of urination   . Hypercholesteremia   . Anxiety   . Mental disorder   . Depression   . Arthritis   . Insomnia   . Back spasm   . Fuch's endothelial dystrophy     bilateral eyes  . Elevated troponin 09/11/2014  . Hypertensive heart disease 09/11/2014  . Benign essential HTN 09/11/2014  . Normal coronary arteries 2010    cath with no obstructive epicardial disease.  Done for CP which was felt to be due to microvascular angina or diastolic HF  . Chronic diastolic heart failure ([place]) 03/20/2015  . Diabetes mellitus without complication ([place])     diet controled  . UTI (lower urinary tract infection)   . Headache     occ  . OSA on CPAP    Past Surgical History  Procedure Laterality Date  . Cesarean section      x 2  . Cholecystectomy    . Abdominal hysterectomy    . Appendectomy    . Rotator cuff repair Left   . Colonoscopy    . Cardiac catheterization      no CAD  . Shoulder arthroscopy with subacromial decompression and bicep tendon repair Left 08/24/2012    Procedure: SHOULDER ARTHROSCOPY WITH SUBACROMIAL DECOMPRESSION AND BICEP TENDON REPAIR;  Surgeon: Meredith Pel, MD;  Location: [facility];  Service: Orthopedics;  Laterality: Left;  Left Shoulder Arthroscopy, Debridement, Biceps Tenotomy  . Eye surgery Right     implant  . Thyroidectomy N/A 06/12/2015    Procedure: TOTAL THYROIDECTOMY;  Surgeon: Armandina Gemma, MD;  Location: [facility];  Service: General;  Laterality: N/A;   History   Social History  . Marital Status: Married    Spouse Name: N/A    Number of Children: 2   Occupational History  . retired-assistant teacher    Social History Main Topics  . Smoking status: Never Smoker   .  Smokeless tobacco: Not on file  . Alcohol Use: No  . Drug Use: No   Current Outpatient Prescriptions on File Prior to Visit  Medication Sig Dispense Refill  . amLODipine (NORVASC)  10 MG tablet Take 20 mg by mouth 2 (two) times daily.     Marland Kitchen aspirin 81 MG chewable tablet Chew 81 mg by mouth daily.    Marland Kitchen atorvastatin (LIPITOR) 80 MG tablet Take 1 tablet by mouth daily.    . butalbital-acetaminophen-caffeine (FIORICET, ESGIC) 50-325-40 MG per tablet Take 1-2 tablets by mouth as needed for headache or migraine.     . calcium carbonate (OS-CAL - DOSED IN MG OF ELEMENTAL CALCIUM) 1250 (500 Ca) MG tablet Take 2 tablets (1,000 mg of elemental calcium total) by mouth 2 (two) times daily with a meal. 60 tablet 0  . clonazePAM (KLONOPIN) 1 MG tablet Take 1 mg by mouth daily. Patient reports that she takes as needed    . fenofibrate micronized (LOFIBRA) 200 MG capsule Take 200 mg by mouth daily before breakfast.    . furosemide (LASIX) 20 MG tablet Take 20 mg by mouth 2 (two) times daily.    Marland Kitchen lisinopril (PRINIVIL,ZESTRIL) 20 MG tablet Take 20 mg by mouth daily.    . nitroGLYCERIN (NITROSTAT) 0.4 MG SL tablet Place 0.4 mg under the tongue every 5 (five) minutes as needed for chest pain.     . ONE TOUCH ULTRA TEST test strip     . ONETOUCH DELICA LANCETS 85O MISC     . OVER THE COUNTER MEDICATION Apply 4 drops to eye daily as needed (dry eye). Soothe XP lubricant eye drops    . PARoxetine (PAXIL) 20 MG tablet Take 60 mg by mouth every morning.    . potassium chloride SA (K-DUR,KLOR-CON) 20 MEQ tablet Take 1 tablet (20 mEq total) by mouth daily. 90 tablet 3  . traMADol (ULTRAM) 50 MG tablet Take 1-2 tablets (50-100 mg total) by mouth every 6 (six) hours as needed for moderate pain or severe pain. 15 tablet 0  . zolpidem (AMBIEN) 10 MG tablet Take 5 mg by mouth at bedtime. For sleep     No current facility-administered medications on file prior to visit.   Allergies  Allergen Reactions  . Percocet  [Oxycodone-Acetaminophen] Shortness Of Breath and Other (See Comments)    Pt couldn't breathe or catch her breath  . Codeine Other (See Comments)    hyperactivity  . Other Other (See Comments)    Some pain medication given at [facility] for surgery caused hallucinations   . Phenergan [Promethazine Hcl] Other (See Comments)    Hallucinations    Family History  Problem Relation Age of Onset  . Stomach cancer Mother   . Cancer Sister     intestinal   PE: BP 138/80 mmHg  Pulse 65  Temp(Src) 98.2 F (36.8 C) (Oral)  Resp 12  Wt 195 lb (88.451 kg)  SpO2 95% Body mass index is 38.08 kg/(m^2). Wt Readings from Last 3 Encounters:  09/18/15 195 lb (88.451 kg)  06/12/15 190 lb (86.183 kg)  06/08/15 190 lb 9.6 oz (86.456 kg)   Constitutional: overweight, in NAD. No kyphosis. Eyes: PERRLA, EOMI, no exophthalmos ENT: moist mucous membranes,  Thyroid scar healing, without any keloid, erythema, dysesthesia, no cervical lymphadenopathy Cardiovascular: RRR, No MRG, + B pitting edema +3 Respiratory: CTA B Gastrointestinal: abdomen soft, NT, ND, BS+ Musculoskeletal: no deformities, strength intact in all 4 Skin: moist, warm, no rashes Neurological: no tremor with outstretched hands, DTR normal in all 4  Assessment: 1. Hypercalcemia - ? primary hyperparathyroidism (PTH not increased...)  2. S/p thyroidectomy  3. Postsurgical hypothyroidism  4. Vit D insufficiency  Plan: 1.  Hypercalcemia - Patient has had several instances of elevated calcium, with the highest level being at 11.1. Of note, her PTH levels were repeatedly checked and they were low normal in the setting of her hypercalcemia. A PTH-rp level was norma.  Multiple myeloma workup was negative.  Vitamin A level was normal. TSH levels were normal. She had a high 1, 25 dihydroxy vitamin D. Her vit D was normal or minimally low. Her phosphorus was normal, but her urinary calcium was high.  - Moreover, technetium sestamibi  scan, thyroid ultrasound and CT of the neck did not show parathyroid masses - No apparent complications from hypercalcemia: no h/o nephrolithiasis, no abdominal pain, bone pain. -  Her calcium levels normalized after her thyroidectomy...  We will recheck these along with a PTH and vitamin D level today. - If the calcium is normal or slightly high, I would just suggest to follow the levels and not intervene.   2. S/p thyroidectomy -  Thyroid scar healing -  She has no complaints after the thyroid surgery  3. Postsurgical hypothyroidism -  Patient had a very high TSH, a 58, while she was taking levothyroxine along with calcium in the morning. Calcium was stopped approximately a month ago. Will recheck her TFTs today. -  Will continue her levothyroxine 112 g daily and explained that she absolutely needs to take this every single day. -  We discussed at length about correct dosing of levothyroxine and I gave her written instruction about this: take the thyroid hormone every day, with water, at least 30 minutes before breakfast, separated by at least 4 hours from: - acid reflux medications - calcium - iron - multivitamins  4. Vit D insufficiency -  At last visit, since vitamin D was slightly low, we started 2000 units vitamin D daily. She continues this dose today. -  I will check another level today. Return in about 4 months (around 01/19/2016).    Component     Latest Ref Rng 09/18/2015 09/18/2015        11:37 AM 11:37 AM  Calcium       CANCELED  PTH     15 - 65 pg/mL  19  VITD     30.00 - 100.00 ng/mL 32.67   TSH     0.35 - 4.50 uIU/mL 31.80 (H)   T4,Free(Direct)     0.60 - 1.60 ng/dL 0.83    Vitamin D is normal. PTH is normal. Unfortunately, PTH + calcium sample was not placed in the right tube and calcium level could not be analyzed. I will check a calcium level when she comes back in 1.5 months for labs TSH is still very high, but decreasing. I will recheck TFTs in 1.5 months.

## 2015-09-19 ENCOUNTER — Other Ambulatory Visit: Payer: Self-pay

## 2015-09-19 LAB — PTH, INTACT AND CALCIUM: PTH: 19 pg/mL (ref 15–65)

## 2015-09-25 ENCOUNTER — Encounter: Payer: Self-pay | Admitting: Cardiology

## 2015-09-25 ENCOUNTER — Ambulatory Visit (INDEPENDENT_AMBULATORY_CARE_PROVIDER_SITE_OTHER): Payer: Medicare Other | Admitting: Cardiology

## 2015-09-25 VITALS — BP 154/60 | HR 73 | Ht 60.0 in | Wt 198.4 lb

## 2015-09-25 DIAGNOSIS — Z9111 Patient's noncompliance with dietary regimen: Secondary | ICD-10-CM | POA: Insufficient documentation

## 2015-09-25 DIAGNOSIS — I5032 Chronic diastolic (congestive) heart failure: Secondary | ICD-10-CM

## 2015-09-25 DIAGNOSIS — I10 Essential (primary) hypertension: Secondary | ICD-10-CM

## 2015-09-25 DIAGNOSIS — R600 Localized edema: Secondary | ICD-10-CM | POA: Insufficient documentation

## 2015-09-25 DIAGNOSIS — R609 Edema, unspecified: Secondary | ICD-10-CM | POA: Diagnosis not present

## 2015-09-25 DIAGNOSIS — Z91119 Patient's noncompliance with dietary regimen due to unspecified reason: Secondary | ICD-10-CM | POA: Insufficient documentation

## 2015-09-25 MED ORDER — METOPROLOL SUCCINATE ER 25 MG PO TB24: 25.0000 mg | ORAL_TABLET | Freq: Every day | ORAL | Status: DC

## 2015-09-25 MED ORDER — AMLODIPINE BESYLATE 5 MG PO TABS: 5.0000 mg | ORAL_TABLET | Freq: Every day | ORAL | Status: DC

## 2015-09-25 NOTE — Patient Instructions (Addendum)
Medication Instructions:  1) DECREASE AMLODIPINE to 5 mg daily 2) START TOPROL XL 25 mg daily  Labwork: None  Testing/Procedures: None  Follow-Up: Your physician recommends that you schedule a follow-up appointment with the HTN CLINIC in 1 week.  Your physician recommends that you schedule a follow-up appointment with a PA or NP in 3 weeks.  Your physician wants you to follow-up in: 6 months with [name]. You will receive a reminder letter in the mail two months in advance. If you don't receive a letter, please call our office to schedule the follow-up appointment.   Any Other Special Instructions Will Be Listed Below (If Applicable). Low-Sodium Eating Plan Sodium raises blood pressure and causes water to be held in the body. Getting less sodium from food will help lower your blood pressure, reduce any swelling, and protect your heart, liver, and kidneys. We get sodium by adding salt (sodium chloride) to food. Most of our sodium comes from canned, boxed, and frozen foods. Restaurant foods, fast foods, and pizza are also very high in sodium. Even if you take medicine to lower your blood pressure or to reduce fluid in your body, getting less sodium from your food is important. WHAT IS MY PLAN? Most people should limit their sodium intake to 2,300 mg a day. Your health care provider recommends that you limit your sodium intake to 2,000 mg a day.  WHAT DO I NEED TO KNOW ABOUT THIS EATING PLAN? For the low-sodium eating plan, you will follow these general guidelines:  Choose foods with a % Daily Value for sodium of less than 5% (as listed on the food label).   Use salt-free seasonings or herbs instead of table salt or sea salt.   Check with your health care provider or pharmacist before using salt substitutes.   Eat fresh foods.  Eat more vegetables and fruits.  Limit canned vegetables. If you do use them, rinse them well to decrease the sodium.   Limit cheese to 1 oz (28 g)  per day.   Eat lower-sodium products, often labeled as "lower sodium" or "no salt added."  Avoid foods that contain monosodium glutamate (MSG). MSG is sometimes added to [place] food and some canned foods.  Check food labels (Nutrition Facts labels) on foods to learn how much sodium is in one serving.  Eat more home-cooked food and less restaurant, buffet, and fast food.  When eating at a restaurant, ask that your food be prepared with less salt, or no salt if possible.  HOW DO I READ FOOD LABELS FOR SODIUM INFORMATION? The Nutrition Facts label lists the amount of sodium in one serving of the food. If you eat more than one serving, you must multiply the listed amount of sodium by the number of servings. Food labels may also identify foods as:  Sodium free--Less than 5 mg in a serving.  Very low sodium--35 mg or less in a serving.  Low sodium--140 mg or less in a serving.  Light in sodium--50% less sodium in a serving. For example, if a food that usually has 300 mg of sodium is changed to become light in sodium, it will have 150 mg of sodium.  Reduced sodium--25% less sodium in a serving. For example, if a food that usually has 400 mg of sodium is changed to reduced sodium, it will have 300 mg of sodium. WHAT FOODS CAN I EAT? Grains Low-sodium cereals, including oats, puffed wheat and rice, and shredded wheat cereals. Low-sodium crackers. Unsalted rice  and pasta. Lower-sodium bread.  Vegetables Frozen or fresh vegetables. Low-sodium or reduced-sodium canned vegetables. Low-sodium or reduced-sodium tomato sauce and paste. Low-sodium or reduced-sodium tomato and vegetable juices.  Fruits Fresh, frozen, and canned fruit. Fruit juice.  Meat and Other Protein Products Low-sodium canned tuna and salmon. Fresh or frozen meat, poultry, seafood, and fish. Lamb. Unsalted nuts. Dried beans, peas, and lentils without added salt. Unsalted canned beans. Homemade soups without salt. Eggs.   Dairy Milk. Soy milk. Ricotta cheese. Low-sodium or reduced-sodium cheeses. Yogurt.  Condiments Fresh and dried herbs and spices. Salt-free seasonings. Onion and garlic powders. Low-sodium varieties of mustard and ketchup. Fresh or refrigerated horseradish. Lemon juice.  Fats and Oils Reduced-sodium salad dressings. Unsalted butter.  Other Unsalted popcorn and pretzels.  The items listed above may not be a complete list of recommended foods or beverages. Contact your dietitian for more options. WHAT FOODS ARE NOT RECOMMENDED? Grains Instant hot cereals. Bread stuffing, pancake, and biscuit mixes. Croutons. Seasoned rice or pasta mixes. Noodle soup cups. Boxed or frozen macaroni and cheese. Self-rising flour. Regular salted crackers. Vegetables Regular canned vegetables. Regular canned tomato sauce and paste. Regular tomato and vegetable juices. Frozen vegetables in sauces. Salted JamaicaFrench fries. Olives. Rosita FirePickles. Relishes. Sauerkraut. Salsa. Meat and Other Protein Products Salted, canned, smoked, spiced, or pickled meats, seafood, or fish. Bacon, ham, sausage, hot dogs, corned beef, chipped beef, and packaged luncheon meats. Salt pork. Jerky. Pickled herring. Anchovies, regular canned tuna, and sardines. Salted nuts. Dairy Processed cheese and cheese spreads. Cheese curds. Blue cheese and cottage cheese. Buttermilk.  Condiments Onion and garlic salt, seasoned salt, table salt, and sea salt. Canned and packaged gravies. Worcestershire sauce. Tartar sauce. Barbecue sauce. Teriyaki sauce. Soy sauce, including reduced sodium. Steak sauce. Fish sauce. Oyster sauce. Cocktail sauce. Horseradish that you find on the shelf. Regular ketchup and mustard. Meat flavorings and tenderizers. Bouillon cubes. Hot sauce. Tabasco sauce. Marinades. Taco seasonings. Relishes. Fats and Oils Regular salad dressings. Salted butter. Margarine. Ghee. Bacon fat.  Other Potato and tortilla chips. Corn chips  and puffs. Salted popcorn and pretzels. Canned or dried soups. Pizza. Frozen entrees and pot pies.  The items listed above may not be a complete list of foods and beverages to avoid. Contact your dietitian for more information.   This information is not intended to replace advice given to you by your health care provider. Make sure you discuss any questions you have with your health care provider.   Document Released: 10/18/2001 Document Revised: 05/19/2014 Document Reviewed: 03/02/2013 Elsevier Interactive Patient Education [employer]2016 [employer] Inc.     If you need a refill on your cardiac medications before your next appointment, please call your pharmacy.

## 2015-09-25 NOTE — Progress Notes (Signed)
Cardiology Office Note    Date:  09/25/2015   ID:  Kimberly [name], DOB [birth year], MRN [id number]  PCP:  Clayborn HeronVictoria R Rankins, MD  Cardiologist:  Armanda Magicraci Turner, MD   Chief Complaint  Patient presents with  . Congestive Heart Failure  . Hypertension    History of Present Illness:  Kimberly PlanBonnie C [name] is a 75 y.o. female who presents for followup of chronic diastolic CHF and HTN. She has OSA and uses CPAP at night. She has chronic LE edema which is stable. She had a cath in 2010 due to CP showing normal coronary arteries with elevated LVEDP felt to be due to diastolic CHF vs. Microvascular angina. Her CP resolved for a while but reoccurred and nuclear stress test 09/2014 was normal. She denies any CP or SOB. She denies any palpitations, dizziness  or syncope. She has chronic LE edema which is worse and she has been noncompliant with low sodium diet.  She salted her grits this am.  She cooks with sea salt and eats out a lot.      Past Medical History  Diagnosis Date  . Acid reflux   . HOH (hard of hearing)     wears hearing aid in left ear  . PONV (postoperative nausea and vomiting)   . Heart murmur   . Frequency of urination   . Hypercholesteremia   . Anxiety   . Depression   . Arthritis   . Insomnia   . Back spasm   . Fuch's endothelial dystrophy     bilateral eyes  . Benign essential HTN 09/11/2014  . Normal coronary arteries 2010    cath with no obstructive epicardial disease.  Done for CP which was felt to be due to microvascular angina or diastolic HF  . Chronic diastolic heart failure (HCC) 03/20/2015  . Diabetes mellitus without complication (HCC)     diet controled  . UTI (lower urinary tract infection)   . Headache     occ  . OSA on CPAP     Followed by Dr. Fannie Kneelint Young  . Edema extremities   . Dietary noncompliance     Past Surgical History  Procedure Laterality Date  . Cesarean section      x 2  . Cholecystectomy    . Abdominal hysterectomy    .  Appendectomy    . Rotator cuff repair Left   . Colonoscopy    . Cardiac catheterization      no CAD  . Shoulder arthroscopy with subacromial decompression and bicep tendon repair Left 08/24/2012    Procedure: SHOULDER ARTHROSCOPY WITH SUBACROMIAL DECOMPRESSION AND BICEP TENDON REPAIR;  Surgeon: Cammy CopaGregory Scott Dean, MD;  [facility];  Service: Orthopedics;  Laterality: Left;  Left Shoulder Arthroscopy, Debridement, Biceps Tenotomy  . Eye surgery Right     implant  . Thyroidectomy N/A 06/12/2015    Procedure: TOTAL THYROIDECTOMY;  Surgeon: Darnell Levelodd Gerkin, MD;  [facility];  Service: General;  Laterality: N/A;    Current Medications: Outpatient Prescriptions Prior to Visit  Medication Sig Dispense Refill  . amLODipine (NORVASC) 10 MG tablet Take 20 mg by mouth 2 (two) times daily.     Marland Kitchen. aspirin 81 MG chewable tablet Chew 81 mg by mouth daily.    Marland Kitchen. atorvastatin (LIPITOR) 80 MG tablet Take 1 tablet by mouth daily.    . butalbital-acetaminophen-caffeine (FIORICET, ESGIC) 50-325-40 MG per tablet Take 1-2 tablets by mouth as needed for headache or migraine.     .Marland Kitchen  clonazePAM (KLONOPIN) 1 MG tablet Take 1 mg by mouth daily. Patient reports that she takes as needed    . fenofibrate micronized (LOFIBRA) 200 MG capsule Take 200 mg by mouth daily before breakfast.    . furosemide (LASIX) 20 MG tablet Take 20 mg by mouth 2 (two) times daily.    Marland Kitchen levothyroxine (SYNTHROID, LEVOTHROID) 112 MCG tablet Take 112 mcg by mouth daily before breakfast.     . Lido-Capsaicin-Men-Methyl Sal (SOOTHEE EX) Apply 1 drop topically daily.    . Lido-Capsaicin-Men-Methyl Sal (SOOTHEE) 0.5-0.373-09-10 % PTCH Apply topically.    Marland Kitchen lisinopril (PRINIVIL,ZESTRIL) 20 MG tablet Take 20 mg by mouth daily.    . nitroGLYCERIN (NITROSTAT) 0.4 MG SL tablet Place 0.4 mg under the tongue every 5 (five) minutes as needed for chest pain.     . ONE TOUCH ULTRA TEST test strip     . ONETOUCH DELICA LANCETS 33G MISC     . OVER THE COUNTER  MEDICATION Apply 4 drops to eye daily as needed (dry eye). Soothe XP lubricant eye drops    . PARoxetine (PAXIL) 20 MG tablet Take 60 mg by mouth every morning.    . potassium chloride SA (K-DUR,KLOR-CON) 20 MEQ tablet Take 1 tablet (20 mEq total) by mouth daily. 90 tablet 3  . traMADol (ULTRAM) 50 MG tablet Take 1-2 tablets (50-100 mg total) by mouth every 6 (six) hours as needed for moderate pain or severe pain. 15 tablet 0  . zolpidem (AMBIEN) 10 MG tablet Take 5 mg by mouth at bedtime. For sleep     No facility-administered medications prior to visit.     Allergies:   Percocet; Codeine; Other; and Phenergan   Social History   Social History  . Marital Status: Married    Spouse Name: N/A  . Number of Children: 2  . Years of Education: N/A   Occupational History  . retired-assistant teacher    Social History Main Topics  . Smoking status: Never Smoker   . Smokeless tobacco: Never Used  . Alcohol Use: No  . Drug Use: No  . Sexual Activity: Not Asked   Other Topics Concern  . None   Social History Narrative     Family History:  The patient's family history includes Cancer in her sister; Stomach cancer in her mother.   ROS:   Please see the history of present illness.    Review of Systems  HENT: Positive for hearing loss.   Musculoskeletal: Positive for back pain, joint swelling and muscle cramps.  Psychiatric/Behavioral: Positive for depression. The patient is nervous/anxious.    All other systems reviewed and are negative.   PHYSICAL EXAM:   VS:  BP 154/60 mmHg  Pulse 73  Ht 5' (1.524 m)  Wt 198 lb 6.4 oz (89.994 kg)  BMI 38.75 kg/m2   GEN: Well nourished, well developed, in no acute distress HEENT: normal Neck: no JVD, carotid bruits, or masses Cardiac: RRR; no murmurs, rubs, or gallops.  1+  edema.  Intact distal pulses bilaterally.  Respiratory:  clear to auscultation bilaterally, normal work of breathing GI: soft, nontender, nondistended, + BS MS: no  deformity or atrophy Skin: warm and dry, no rash Neuro:  Alert and Oriented x 3, Strength and sensation are intact Psych: euthymic mood, full affect  Wt Readings from Last 3 Encounters:  09/25/15 198 lb 6.4 oz (89.994 kg)  09/18/15 195 lb (88.451 kg)  06/12/15 190 lb (86.183 kg)  Studies/Labs Reviewed:     Recent Labs: 06/08/2015: Hemoglobin 12.1; Platelets 216 06/13/2015: BUN 23*; Creatinine, Ser 1.22*; Potassium 4.7; Sodium 138 09/18/2015: TSH 31.80*   Lipid Panel No results found for: CHOL, TRIG, HDL, CHOLHDL, VLDL, LDLCALC, LDLDIRECT  Additional studies/ records that were reviewed today include:  none    ASSESSMENT:    1. Chronic diastolic heart failure (HCC)   2. Benign essential HTN   3. Edema extremities      PLAN:  In order of problems listed above:  1. Chronic diastolic CHF - she appears euvolemic on exam but has some LE edema from noncompliance with a low sodium diet.   Continue diuretic.  I encouraged her to follow a low sodium diet. I will start low dose beta blocker to decrease HR further and increase diastolic filling time.   2. HTN - BP borderline controlled on current medical regimen.  Decrease amlodipine to 5mg  daily due to LE edema and start Toprol XL 25mg  daily.  Followup in HTN clinic in 1 week for med titration as needed for elevated BP.  Ultimately would like to get off amlodipine altogether due to LE edema.  3. LE edema - she has been noncompliant with low sodium diet. She eats out a lot as well.  She cooks with sea salt.  We discussed a 2gm sodium diet and no added salt in her cooking.  Continue Lasix 40mg  daily.    Followup with PA in 3 weeks and with me in 6 months.    Medication Adjustments/Labs and Tests Ordered: Current medicines are reviewed at length with the patient today.  Concerns regarding medicines are outlined above.  Medication changes, Labs and Tests ordered today are listed in the Patient Instructions below.  There are no Patient  Instructions on file for this visit.   Signed, Armanda Magic, MD  09/25/2015 10:01 AM    [facility] [address] [place], [place], [place]  [address] Phone: [phone]; Fax: [phone]

## 2015-10-02 ENCOUNTER — Ambulatory Visit (INDEPENDENT_AMBULATORY_CARE_PROVIDER_SITE_OTHER): Payer: Medicare Other | Admitting: Pharmacist

## 2015-10-02 ENCOUNTER — Encounter: Payer: Self-pay | Admitting: Pharmacist

## 2015-10-02 VITALS — BP 138/74 | HR 42 | Ht 60.0 in | Wt 194.0 lb

## 2015-10-02 DIAGNOSIS — I110 Hypertensive heart disease with heart failure: Secondary | ICD-10-CM | POA: Diagnosis not present

## 2015-10-02 DIAGNOSIS — I5032 Chronic diastolic (congestive) heart failure: Secondary | ICD-10-CM | POA: Diagnosis not present

## 2015-10-02 DIAGNOSIS — I10 Essential (primary) hypertension: Secondary | ICD-10-CM | POA: Diagnosis not present

## 2015-10-02 LAB — HEPATIC FUNCTION PANEL
ALBUMIN: 4.5 g/dL (ref 3.6–5.1)
ALT: 14 U/L (ref 6–29)
AST: 19 U/L (ref 10–35)
Alkaline Phosphatase: 28 U/L — ABNORMAL LOW (ref 33–130)
BILIRUBIN DIRECT: 0.1 mg/dL (ref <=0.2)
BILIRUBIN TOTAL: 0.4 mg/dL (ref 0.2–1.2)
Indirect Bilirubin: 0.3 mg/dL (ref 0.2–1.2)
Total Protein: 7 g/dL (ref 6.1–8.1)

## 2015-10-02 LAB — BASIC METABOLIC PANEL
BUN: 30 mg/dL — ABNORMAL HIGH (ref 7–25)
CO2: 28 mmol/L (ref 20–31)
Calcium: 9.6 mg/dL (ref 8.6–10.4)
Chloride: 101 mmol/L (ref 98–110)
Creat: 1.28 mg/dL — ABNORMAL HIGH (ref 0.60–0.93)
GLUCOSE: 116 mg/dL — AB (ref 65–99)
POTASSIUM: 4.5 mmol/L (ref 3.5–5.3)
SODIUM: 140 mmol/L (ref 135–146)

## 2015-10-02 LAB — LIPID PANEL
CHOL/HDL RATIO: 5.1 ratio — AB (ref <=5.0)
CHOLESTEROL: 152 mg/dL (ref 125–200)
HDL: 30 mg/dL — AB (ref >=46)
LDL Cholesterol: 88 mg/dL (ref <130)
Triglycerides: 169 mg/dL — ABNORMAL HIGH (ref <150)
VLDL: 34 mg/dL — AB (ref <30)

## 2015-10-02 NOTE — Progress Notes (Signed)
Patient ID: Kimberly PlanBonnie C [name]                 DOB: 1940-10-26                      MRN: [id number]     HPI: Kimberly [name] is a 75 y.o. female referred by Dr. Mayford Knifeurner to HTN clinic. PMH significant for HTN, chronic diastolic HF, T2DM, OSA, and hypothyroidism s/p thyroidectomy. Seen by Dr. Mayford Knifeurner on 5/16 for F/U. Worsening LLE due to non-compliance with sodium restrictions. Amlodipine reduced to 5mg  daily in the setting of worsening LEE and Toprol-XL 25mg  daily initiated for BP control. Pt reports today she started Toprol on 5/12 and had experienced some transient dizziness which resolved. However, states she has been persistently more fatigued since starting Toprol. Weighs daily and checks BP 4-5 times per week. Wt ~190lb and BP 140s-150s systolic. Has started making dietary changes since last visit with Dr. Mayford Knifeurner. Upset with her current wt and would like to lose 20lbs by Sept. Has discarded all table salt at home and replaced with salt substitute. Eats out frequently but has tried healthier options while doing so.   Current HTN meds: Lisinopril 20mg  daily, amlodipine 5mg  daily, Toprol-XL 25mg  daily, Lasix 20mg  BID Previously tried: HCTZ 25mg  daily, amlodipine 10mg  BP goal: <140/90 mmHg  Family History: Cancer in her sister; Stomach cancer in her mother  Social History: Never smoker. Denies smokeless tobacco, EtOH, and illicit drugs  Diet: Eats out 7 days a week for breakfast and lunch. 2-3x per week for dinner. Has recently begun opting for healthier choices when eating out (salads, grilled chicken)  Exercise: Not able to walk much do to pain. Reports recently buying a walker to help her walk through the grocery store.    Wt Readings from Last 3 Encounters:  10/02/15 194 lb (87.998 kg)  09/25/15 198 lb 6.4 oz (89.994 kg)  09/18/15 195 lb (88.451 kg)   BP Readings from Last 3 Encounters:  10/02/15 138/74  09/25/15 154/60  09/18/15 138/80   Pulse Readings from Last 3 Encounters:  10/02/15  42  09/25/15 73  09/18/15 65    Renal function: CrCl cannot be calculated (Patient has no serum creatinine result on file.).  Past Medical History  Diagnosis Date  . Acid reflux   . HOH (hard of hearing)     wears hearing aid in left ear  . PONV (postoperative nausea and vomiting)   . Heart murmur   . Frequency of urination   . Hypercholesteremia   . Anxiety   . Depression   . Arthritis   . Insomnia   . Back spasm   . Fuch's endothelial dystrophy     bilateral eyes  . Benign essential HTN 09/11/2014  . Normal coronary arteries 2010    cath with no obstructive epicardial disease.  Done for CP which was felt to be due to microvascular angina or diastolic HF  . Chronic diastolic heart failure (HCC) 03/20/2015  . Diabetes mellitus without complication (HCC)     diet controled  . UTI (lower urinary tract infection)   . Headache     occ  . OSA on CPAP     Followed by Dr. Fannie Kneelint Young  . Edema extremities   . Dietary noncompliance     Current Outpatient Prescriptions on File Prior to Visit  Medication Sig Dispense Refill  . amLODipine (NORVASC) 5 MG tablet Take 1 tablet (5 mg total) by  mouth daily. 30 tablet 11  . aspirin 81 MG chewable tablet Chew 81 mg by mouth daily.    Marland Kitchen atorvastatin (LIPITOR) 80 MG tablet Take 1 tablet by mouth daily.    . butalbital-acetaminophen-caffeine (FIORICET, ESGIC) 50-325-40 MG per tablet Take 1-2 tablets by mouth as needed for headache or migraine.     . clonazePAM (KLONOPIN) 1 MG tablet Take 1 mg by mouth daily. Patient reports that she takes as needed    . fenofibrate micronized (LOFIBRA) 200 MG capsule Take 200 mg by mouth daily before breakfast.    . furosemide (LASIX) 20 MG tablet Take 20 mg by mouth 2 (two) times daily.    Marland Kitchen levothyroxine (SYNTHROID, LEVOTHROID) 112 MCG tablet Take 112 mcg by mouth daily before breakfast.     . Lido-Capsaicin-Men-Methyl Sal (SOOTHEE EX) Apply 1 drop topically daily.    . Lido-Capsaicin-Men-Methyl Sal  (SOOTHEE) 0.5-0.373-09-10 % PTCH Apply topically.    Marland Kitchen lisinopril (PRINIVIL,ZESTRIL) 20 MG tablet Take 20 mg by mouth daily.    . nitroGLYCERIN (NITROSTAT) 0.4 MG SL tablet Place 0.4 mg under the tongue every 5 (five) minutes as needed for chest pain.     . ONE TOUCH ULTRA TEST test strip     . ONETOUCH DELICA LANCETS 33G MISC     . OVER THE COUNTER MEDICATION Apply 4 drops to eye daily as needed (dry eye). Soothe XP lubricant eye drops    . PARoxetine (PAXIL) 20 MG tablet Take 60 mg by mouth every morning.    . potassium chloride SA (K-DUR,KLOR-CON) 20 MEQ tablet Take 1 tablet (20 mEq total) by mouth daily. 90 tablet 3  . traMADol (ULTRAM) 50 MG tablet Take 1-2 tablets (50-100 mg total) by mouth every 6 (six) hours as needed for moderate pain or severe pain. 15 tablet 0  . zolpidem (AMBIEN) 10 MG tablet Take 5 mg by mouth at bedtime. For sleep     No current facility-administered medications on file prior to visit.    Allergies  Allergen Reactions  . Percocet [Oxycodone-Acetaminophen] Shortness Of Breath and Other (See Comments)    Pt couldn't breathe or catch her breath  . Codeine Other (See Comments)    hyperactivity  . Other Other (See Comments)    Some pain medication given at [facility] for surgery caused hallucinations   . Phenergan [Promethazine Hcl] Other (See Comments)    Hallucinations      Assessment/Plan:  1. Hypertension: BP today 138/74 at goal of less than 140/90 mmHg. Pt experiencing increased fatigue with HR today 41 BPM. Will discontinue Toprol. Discussed dietary management at length with pt as this is largely contributing to her CV disease. Will get BMET today to direct further HTN medication adjustments. Plan to increase lisinopril to  daily and D/C amlodipine pending results.   2. Hyperlipidemia: Pt on atorvastatin  daily and fenofibrate  daily. Will get lipid panel today as we have none on file. Will call pt once labs have resulted. F/U visit  in one month.  Sherle Poe, PharmD Clinical Pharmacy Resident 9:49 AM, 10/02/2015

## 2015-10-02 NOTE — Patient Instructions (Signed)
STOP taking your Toprol (metoprolol). You should feel less fatigued in about a week or stop by stopping this medication.   We will get blood work today to direct further adjustments to your medications. We will call you with the results.  Encourage you to continue the dietary changes you have made!  Bring your blood pressure cuff to your next visit with us so we can see if there are any differences in readings  If you have any medication questions you may call our clinic directly: [phone]  We'll see you back in a month!

## 2015-10-09 ENCOUNTER — Telehealth: Payer: Self-pay | Admitting: Pharmacist

## 2015-10-09 DIAGNOSIS — I10 Essential (primary) hypertension: Secondary | ICD-10-CM

## 2015-10-09 MED ORDER — LISINOPRIL 40 MG PO TABS: 40.0000 mg | ORAL_TABLET | Freq: Every day | ORAL | Status: DC

## 2015-10-09 NOTE — Telephone Encounter (Signed)
Spoke with pt regarding BMET results from 10/02/15. Electrolytes WNL, SCr 1.28 and stable. BP at last HTN clinic visit 138/74. Will increase lisinopril to 40mg  daily and D/C amlodipine for now. Medication list updated. Pt has follow visit in HTN clinic on 10/30/15.

## 2015-10-14 ENCOUNTER — Encounter: Payer: Self-pay | Admitting: Physician Assistant

## 2015-10-14 NOTE — Progress Notes (Signed)
Cardiology Office Note:    Date:  10/14/2015   ID:  Kimberly PlanBonnie C Schwanke, DOB 1940-07-29, MRN [id number]  PCP:  Clayborn HeronVictoria R Rankins, MD  Cardiologist:  Dr. Armanda Magicraci Turner   Electrophysiologist:  n/a  Referring MD: Clayborn Heronankins, [name], MD   Chief Complaint  Patient presents with  . Follow-up    CHF, HTN    History of Present Illness:     Kimberly [name] is a 75 y.o. female with a hx of    Past Medical History  Diagnosis Date  . Acid reflux   . HOH (hard of hearing)     wears hearing aid in left ear  . Heart murmur     Echo 4/16: Mild concentric LVH, EF 55-60%, no RWMA, Gr 2 DD, mild LAE, trivial pericardial effusion  . Frequency of urination   . Hypercholesteremia   . Anxiety   . Depression   . Arthritis   . Insomnia   . Back spasm   . Fuch's endothelial dystrophy     bilateral eyes  . Hypertensive heart disease with CHF (HCC) 09/11/2014  . Normal coronary arteries 2010    cath with no obstructive epicardial disease.  Done for CP which was felt to be due to microvascular angina or diastolic HF  . Chronic diastolic heart failure (HCC) 03/20/2015  . Diabetes mellitus without complication (HCC)     diet controled  . Headache     occ  . OSA on CPAP     Followed by Dr. Fannie Kneelint Young  . Edema extremities   . Dietary noncompliance     Past Surgical History  Procedure Laterality Date  . Cesarean section      x 2  . Cholecystectomy    . Abdominal hysterectomy    . Appendectomy    . Rotator cuff repair Left   . Colonoscopy    . Cardiac catheterization      no CAD  . Shoulder arthroscopy with subacromial decompression and bicep tendon repair Left 08/24/2012    Procedure: SHOULDER ARTHROSCOPY WITH SUBACROMIAL DECOMPRESSION AND BICEP TENDON REPAIR;  Surgeon: Cammy CopaGregory Nakesha Ebrahim Dean, MD;  [facility];  Service: Orthopedics;  Laterality: Left;  Left Shoulder Arthroscopy, Debridement, Biceps Tenotomy  . Eye surgery Right     implant  . Thyroidectomy N/A 06/12/2015    Procedure: TOTAL  THYROIDECTOMY;  Surgeon: Darnell Levelodd Gerkin, MD;  [facility];  Service: General;  Laterality: N/A;    Current Medications: Outpatient Prescriptions Prior to Visit  Medication Sig Dispense Refill  . aspirin 81 MG chewable tablet Chew 81 mg by mouth daily.    Marland Kitchen. atorvastatin (LIPITOR) 80 MG tablet Take 1 tablet by mouth daily.    . butalbital-acetaminophen-caffeine (FIORICET, ESGIC) 50-325-40 MG per tablet Take 1-2 tablets by mouth as needed for headache or migraine.     . clonazePAM (KLONOPIN) 1 MG tablet Take 1 mg by mouth daily. Patient reports that she takes as needed    . fenofibrate micronized (LOFIBRA) 200 MG capsule Take 200 mg by mouth daily before breakfast.    . furosemide (LASIX) 20 MG tablet Take 20 mg by mouth 2 (two) times daily.    Marland Kitchen. levothyroxine (SYNTHROID, LEVOTHROID) 112 MCG tablet Take 112 mcg by mouth daily before breakfast.     . Lido-Capsaicin-Men-Methyl Sal (SOOTHEE EX) Apply 1 drop topically daily.    . Lido-Capsaicin-Men-Methyl Sal (SOOTHEE) 0.5-0.373-09-10 % PTCH Apply topically.    Marland Kitchen. lisinopril (PRINIVIL,ZESTRIL) 40 MG tablet Take 1 tablet (  40 mg total) by mouth daily. 90 tablet 11  . nitroGLYCERIN (NITROSTAT) 0.4 MG SL tablet Place 0.4 mg under the tongue every 5 (five) minutes as needed for chest pain.     . ONE TOUCH ULTRA TEST test strip     . ONETOUCH DELICA LANCETS 33G MISC     . OVER THE COUNTER MEDICATION Apply 4 drops to eye daily as needed (dry eye). Soothe XP lubricant eye drops    . PARoxetine (PAXIL) 20 MG tablet Take 60 mg by mouth every morning.    . potassium chloride SA (K-DUR,KLOR-CON) 20 MEQ tablet Take 1 tablet (20 mEq total) by mouth daily. 90 tablet 3  . traMADol (ULTRAM) 50 MG tablet Take 1-2 tablets (50-100 mg total) by mouth every 6 (six) hours as needed for moderate pain or severe pain. 15 tablet 0  . zolpidem (AMBIEN) 10 MG tablet Take 5 mg by mouth at bedtime. For sleep     No facility-administered medications prior to visit.       Allergies:   Percocet; Codeine; Other; and Phenergan   Social History   Social History  . Marital Status: Married    Spouse Name: N/A  . Number of Children: 2  . Years of Education: N/A   Occupational History  . retired-assistant teacher    Social History Main Topics  . Smoking status: Never Smoker   . Smokeless tobacco: Never Used  . Alcohol Use: No  . Drug Use: No  . Sexual Activity: Not on file   Other Topics Concern  . Not on file   Social History Narrative     Family History:  The patient's family history includes Cancer in her sister; Stomach cancer in her mother.   ROS:   Please see the history of present illness.    ROS All other systems reviewed and are negative.   Physical Exam:    VS:  There were no vitals taken for this visit.   Physical Exam  Wt Readings from Last 3 Encounters:  10/02/15 194 lb (87.998 kg)  09/25/15 198 lb 6.4 oz (89.994 kg)  09/18/15 195 lb (88.451 kg)      Studies/Labs Reviewed:     EKG:  EKG is  ordered today.  The ekg ordered today demonstrates   Recent Labs: 06/08/2015: Hemoglobin 12.1; Platelets 216 09/18/2015: TSH 31.80* 10/02/2015: ALT 14; BUN 30*; Creat 1.28*; Potassium 4.5; Sodium 140   Recent Lipid Panel    Component Value Date/Time   CHOL 152 10/02/2015 0901   TRIG 169* 10/02/2015 0901   HDL 30* 10/02/2015 0901   CHOLHDL 5.1* 10/02/2015 0901   VLDL 34* 10/02/2015 0901   LDLCALC 88 10/02/2015 0901    Additional studies/ records that were reviewed today include:    Myoview 09/21/14 Normal lexiscan myovue study with no infarct or ischemia EF 70%  Echo 08/25/14 Mild concentric LVH, EF 55-60%, no RWMA, Gr 2 DD, mild LAE, trivial pericardial effusion  LHC 4/10 LM patent LAD no obstruction LCx no obstruction RCA no obstrution EF 80%  ASSESSMENT:     1. Chronic diastolic heart failure (HCC)   2. Hypertensive heart disease with heart failure (HCC)   3. Edema extremities   4. Obstructive sleep apnea      PLAN:     In order of problems listed above:  1.    Medication Adjustments/Labs and Tests Ordered: Current medicines are reviewed at length with the patient today.  Concerns regarding medicines are outlined above.  Medication changes, Labs and Tests ordered today are outlined in the Patient Instructions noted below. There are no Patient Instructions on file for this visit. Signed, Tereso Newcomer, PA-C  10/14/2015 7:44 PM    [facility] [address] [place], [place], [place]  [address] Phone: [phone]; Fax: [phone]     This encounter was created in error - please disregard.

## 2015-10-15 ENCOUNTER — Encounter: Payer: Medicare Other | Admitting: Physician Assistant

## 2015-10-30 ENCOUNTER — Ambulatory Visit (INDEPENDENT_AMBULATORY_CARE_PROVIDER_SITE_OTHER): Payer: Medicare Other | Admitting: Pharmacist

## 2015-10-30 VITALS — BP 189/58 | HR 55 | Wt 190.2 lb

## 2015-10-30 DIAGNOSIS — I10 Essential (primary) hypertension: Secondary | ICD-10-CM | POA: Diagnosis not present

## 2015-10-30 NOTE — Patient Instructions (Addendum)
Take your lisinopril 40mg  and furosemide 20mg  twice a day - take today's dose immediately.  Continue to check your blood pressure.   Follow up in clinic in a week for blood pressure check; bring your home cuff in with you.
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
## 2015-11-05 ENCOUNTER — Encounter: Payer: Self-pay | Admitting: Physician Assistant

## 2015-11-08 ENCOUNTER — Ambulatory Visit (INDEPENDENT_AMBULATORY_CARE_PROVIDER_SITE_OTHER): Payer: Medicare Other | Admitting: Pharmacist

## 2015-11-08 VITALS — BP 180/76 | HR 61

## 2015-11-08 DIAGNOSIS — I10 Essential (primary) hypertension: Secondary | ICD-10-CM | POA: Diagnosis not present

## 2015-11-08 MED ORDER — CHLORTHALIDONE 25 MG PO TABS: 25.0000 mg | ORAL_TABLET | Freq: Every day | ORAL | Status: DC
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
## 2015-11-08 NOTE — Patient Instructions (Addendum)
Move your lisinopril to night time dosing. A refill will be ready tomorrow at the pharmacy for you.  Pick up chlorthalidone 25mg  to start taking once a day for your blood pressure - take this in the morning.  Continue taking your furosemide (Lasix) twice a day - in the morning and in the early afternoon.  Keep checking your blood pressure at home and record your readings.  Follow up in blood pressure clinic in 2 weeks.

## 2015-11-22 ENCOUNTER — Other Ambulatory Visit (INDEPENDENT_AMBULATORY_CARE_PROVIDER_SITE_OTHER): Payer: Medicare Other | Admitting: *Deleted

## 2015-11-22 ENCOUNTER — Ambulatory Visit (INDEPENDENT_AMBULATORY_CARE_PROVIDER_SITE_OTHER): Payer: Medicare Other | Admitting: Pharmacist

## 2015-11-22 ENCOUNTER — Encounter: Payer: Self-pay | Admitting: Pharmacist

## 2015-11-22 VITALS — BP 152/68 | HR 79 | Wt 185.5 lb

## 2015-11-22 DIAGNOSIS — I10 Essential (primary) hypertension: Secondary | ICD-10-CM

## 2015-11-22 LAB — BASIC METABOLIC PANEL
BUN: 42 mg/dL — AB (ref 7–25)
CALCIUM: 10.6 mg/dL — AB (ref 8.6–10.4)
CHLORIDE: 92 mmol/L — AB (ref 98–110)
CO2: 30 mmol/L (ref 20–31)
CREATININE: 1.6 mg/dL — AB (ref 0.60–0.93)
Glucose, Bld: 180 mg/dL — ABNORMAL HIGH (ref 65–99)
Potassium: 3.4 mmol/L — ABNORMAL LOW (ref 3.5–5.3)
Sodium: 137 mmol/L (ref 135–146)

## 2015-11-22 MED ORDER — CLONIDINE HCL 0.1 MG PO TABS: 0.1000 mg | ORAL_TABLET | ORAL | Status: DC | PRN
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
## 2015-11-22 NOTE — Patient Instructions (Signed)
Continue taking your current blood pressure medicines.   Pick up clonidine 0.1mg  tablets and take 1 as needed for a systolic blood pressure reading of 170 or greater.  Try taking your chlorthalidone with food to see if this helps with your nausea.  Recheck blood pressure in 1 month.

## 2015-11-26 ENCOUNTER — Telehealth: Payer: Self-pay | Admitting: Cardiology

## 2015-11-26 NOTE — Telephone Encounter (Signed)
New message     The pt needs to speak with Kimberly [name], in the pharmacy

## 2015-11-26 NOTE — Telephone Encounter (Signed)
Pt called to report that she has been feeling better, has not had as many headaches. Home readings 110-169 systolic, mostly 161W130s. Advised pt to continue with current medications and will still plan to see her next month for f/u.

## 2015-11-27 ENCOUNTER — Encounter: Payer: Self-pay | Admitting: Cardiology

## 2015-11-27 NOTE — Telephone Encounter (Signed)
New message ° ° ° ° °Returning a call to the nurse °

## 2015-11-27 NOTE — Telephone Encounter (Signed)
This encounter was created in error - please disregard.

## 2015-11-29 ENCOUNTER — Other Ambulatory Visit: Payer: Self-pay | Admitting: Pharmacist

## 2015-11-29 DIAGNOSIS — I10 Essential (primary) hypertension: Secondary | ICD-10-CM

## 2015-12-06 ENCOUNTER — Other Ambulatory Visit: Payer: Medicare Other | Admitting: *Deleted

## 2015-12-06 DIAGNOSIS — I10 Essential (primary) hypertension: Secondary | ICD-10-CM

## 2015-12-06 LAB — BASIC METABOLIC PANEL
BUN: 40 mg/dL — ABNORMAL HIGH (ref 7–25)
CALCIUM: 9.4 mg/dL (ref 8.6–10.4)
CO2: 28 mmol/L (ref 20–31)
Chloride: 101 mmol/L (ref 98–110)
Creat: 1.5 mg/dL — ABNORMAL HIGH (ref 0.60–0.93)
Glucose, Bld: 109 mg/dL — ABNORMAL HIGH (ref 65–99)
Potassium: 3.8 mmol/L (ref 3.5–5.3)
SODIUM: 140 mmol/L (ref 135–146)

## 2015-12-14 ENCOUNTER — Telehealth: Payer: Self-pay

## 2015-12-14 DIAGNOSIS — I5032 Chronic diastolic (congestive) heart failure: Secondary | ICD-10-CM

## 2015-12-14 MED ORDER — FUROSEMIDE 20 MG PO TABS: 20.0000 mg | ORAL_TABLET | Freq: Every day | ORAL | 11 refills | Status: DC

## 2015-12-14 NOTE — Telephone Encounter (Signed)
Informed patient of results and verbal understanding expressed.   Instructed patient to DECREASE LASIX to 20 mg daily. Repeat BMET scheduled next Thursday. Confirmed HTN Clinic OV already scheduled 8/24. Patient agrees with treatment plan.

## 2015-12-14 NOTE — Telephone Encounter (Signed)
-----   Message from Quintella Reichert, MD sent at 12/08/2015 11:48 PM EDT ----- Creatinine increased - please decrease Lasix to 20mg  daily and repeat BMET in 1 week and followup with HTN clinic in 2 weeks

## 2015-12-20 ENCOUNTER — Other Ambulatory Visit: Payer: Medicare Other | Admitting: *Deleted

## 2015-12-20 DIAGNOSIS — I5032 Chronic diastolic (congestive) heart failure: Secondary | ICD-10-CM

## 2015-12-20 LAB — BASIC METABOLIC PANEL
BUN: 25 mg/dL (ref 7–25)
CALCIUM: 10.2 mg/dL (ref 8.6–10.4)
CHLORIDE: 99 mmol/L (ref 98–110)
CO2: 30 mmol/L (ref 20–31)
CREATININE: 1.08 mg/dL — AB (ref 0.60–0.93)
GLUCOSE: 193 mg/dL — AB (ref 65–99)
Potassium: 3.8 mmol/L (ref 3.5–5.3)
Sodium: 136 mmol/L (ref 135–146)

## 2015-12-27 ENCOUNTER — Ambulatory Visit: Payer: Medicare Other

## 2016-01-03 ENCOUNTER — Other Ambulatory Visit: Payer: Self-pay | Admitting: Pharmacist

## 2016-01-03 ENCOUNTER — Ambulatory Visit (INDEPENDENT_AMBULATORY_CARE_PROVIDER_SITE_OTHER): Payer: Medicare Other | Admitting: Pharmacist

## 2016-01-03 VITALS — BP 132/70 | HR 65

## 2016-01-03 DIAGNOSIS — I10 Essential (primary) hypertension: Secondary | ICD-10-CM | POA: Diagnosis not present

## 2016-01-03 NOTE — Progress Notes (Signed)
Patient ID: Kimberly [name]                 DOB: 08-Jul-1940                      MRN: [id number]     HPI: Kimberly [name] is a 75 y.o. female well known to HTN clinic who presents today for follow up. PMH significant for HTN, chronic diastolic HF, T2DM, OSA, and hypothyroidism s/p thyroidectomy. Seen by Dr. Mayford Knifeurner on 5/16 for follow up. Worsening LLE due to non-compliance with sodium restrictions. Patient has intolerances to multiple BP medications and presents today for f/u. Most recently, chlorthalidone was d/ced and Lasix dose was decreased due to bump in SCr.  Pt reports she has been feeling so much better recently. She does not have a headache anymore and her feet are not swollen despite recent dose decrease of Lasix. Has been taking her clonidine once a day rather than prn. Reports taking her lisinopril at night and the Lasix and clonidine in the morning. Has been staying well hydrated with water - this has been replacing diet caffeine coke.  Checks her BP a few times a day - seeing systolic BP readings in the 140-150s which are much improved from previous home readings.  Current HTN meds: lisinopril 40mg  daily, Lasix 20mg  daily, clonidine 0.1mg  prn SBP > 170 (has been taking daily) Previously tried: chlorthalidone - bump in SCr, HCTZ - does not remember reaction, amlodipine - LLE, Lasix 20mg  BID - bump in SCr, metoprolol - fatigue and bradycardia to low 40s BP goal: 150/2290mmHg given age and occasional dizziness  Family History: Cancer in her sister; Stomach cancer in her mother  Social History: Never smoker. Denies smokeless tobacco, EtOH, and illicit drugs  Diet: Eats out 7 days a week for breakfast and lunch. 2-3x per week for dinner. Has recently begun opting for healthier choices at restaurants (salads, grilled chicken). Would like to lose 20 lbs by September. Stopped adding salt to her food and has been substituting soda with lemon water.  Exercise: Not able to walk much do to pain.  Reports recently buying a walker to help her walk through the grocery store   Wt Readings from Last 3 Encounters:  11/22/15 185 lb 8 oz (84.1 kg)  10/30/15 190 lb 4 oz (86.3 kg)  10/02/15 194 lb (88 kg)   BP Readings from Last 3 Encounters:  11/22/15 (!) 152/68  11/08/15 (!) 180/76  10/30/15 (!) 189/58   Pulse Readings from Last 3 Encounters:  11/22/15 79  11/08/15 61  10/30/15 (!) 55    Renal function: CrCl cannot be calculated (Unknown ideal weight.).  Past Medical History:  Diagnosis Date  . Acid reflux   . Anxiety   . Arthritis   . Back spasm   . Chronic diastolic heart failure (HCC) 03/20/2015  . Depression   . Diabetes mellitus without complication (HCC)    diet controled  . Dietary noncompliance   . Edema extremities   . Frequency of urination   . Fuch's endothelial dystrophy    bilateral eyes  . Headache    occ  . Heart murmur    Echo 4/16: Mild concentric LVH, EF 55-60%, no RWMA, Gr 2 DD, mild LAE, trivial pericardial effusion  . HOH (hard of hearing)    wears hearing aid in left ear  . Hypercholesteremia   . Hypertensive heart disease with CHF (HCC) 09/11/2014  . Insomnia   .  Normal coronary arteries 2010   cath with no obstructive epicardial disease.  Done for CP which was felt to be due to microvascular angina or diastolic HF  . OSA on CPAP    Followed by Dr. Fannie Knee    Current Outpatient Prescriptions on File Prior to Visit  Medication Sig Dispense Refill  . aspirin 81 MG chewable tablet Chew 81 mg by mouth daily.    Marland Kitchen atorvastatin (LIPITOR) 80 MG tablet Take 1 tablet by mouth daily.    . butalbital-acetaminophen-caffeine (FIORICET, ESGIC) 50-325-40 MG per tablet Take 1-2 tablets by mouth as needed for headache or migraine.     . clonazePAM (KLONOPIN) 1 MG tablet Take 1 mg by mouth daily. Patient reports that she takes as needed    . cloNIDine (CATAPRES) 0.1 MG tablet Take 1 tablet (0.1 mg total) by mouth as needed (for systolic BP > 170). 30  tablet 11  . fenofibrate micronized (LOFIBRA) 200 MG capsule Take 200 mg by mouth daily before breakfast.    . furosemide (LASIX) 20 MG tablet Take 1 tablet (20 mg total) by mouth daily. 30 tablet 11  . levothyroxine (SYNTHROID, LEVOTHROID) 112 MCG tablet Take 112 mcg by mouth daily before breakfast.     . Lido-Capsaicin-Men-Methyl Sal (SOOTHEE EX) Apply 1 drop topically daily. Eye drops    . lisinopril (PRINIVIL,ZESTRIL) 40 MG tablet Take 1 tablet (40 mg total) by mouth daily. 90 tablet 11  . nitroGLYCERIN (NITROSTAT) 0.4 MG SL tablet Place 0.4 mg under the tongue every 5 (five) minutes as needed for chest pain.     . ONE TOUCH ULTRA TEST test strip     . ONETOUCH DELICA LANCETS 33G MISC     . PARoxetine (PAXIL) 20 MG tablet Take 60 mg by mouth every morning.    . potassium chloride SA (K-DUR,KLOR-CON) 20 MEQ tablet Take 1 tablet (20 mEq total) by mouth daily. 90 tablet 3  . traMADol (ULTRAM) 50 MG tablet Take 1-2 tablets (50-100 mg total) by mouth every 6 (six) hours as needed for moderate pain or severe pain. 15 tablet 0  . zolpidem (AMBIEN) 10 MG tablet Take 5 mg by mouth at bedtime. For sleep     No current facility-administered medications on file prior to visit.     Allergies  Allergen Reactions  . Percocet [Oxycodone-Acetaminophen] Shortness Of Breath and Other (See Comments)    Pt couldn't breathe or catch her breath  . Codeine Other (See Comments)    hyperactivity  . Other Other (See Comments)    Some pain medication given at [facility] for surgery caused hallucinations   . Phenergan [Promethazine Hcl] Other (See Comments)    Hallucinations      Assessment/Plan:  1. Hypertension - BP finally at goal < 150/42mmHg and well controlled on lisinopril, clonidine, and furosemide. Pt has many BP medication intolerances and multiple medications were adjusted due to fluctuating renal function. No medication changes made today since home BP readings and clinic reading at goal.  Advised pt to call clinic with any extremity swelling or large variations in home BP readings.   Megan E. Supple, PharmD, CPP [facility] [address] [place], [place] [address] Phone: [phone]; Fax: [phone] 01/03/2016 8:44 AM

## 2016-01-16 ENCOUNTER — Ambulatory Visit (INDEPENDENT_AMBULATORY_CARE_PROVIDER_SITE_OTHER): Payer: Medicare Other | Admitting: Internal Medicine

## 2016-01-16 DIAGNOSIS — E890 Postprocedural hypothyroidism: Secondary | ICD-10-CM

## 2016-01-16 DIAGNOSIS — E559 Vitamin D deficiency, unspecified: Secondary | ICD-10-CM | POA: Diagnosis not present

## 2016-01-16 DIAGNOSIS — Z9889 Other specified postprocedural states: Secondary | ICD-10-CM

## 2016-01-16 LAB — TSH: TSH: 41.6 u[IU]/mL — ABNORMAL HIGH (ref 0.35–4.50)

## 2016-01-16 LAB — T4, FREE: Free T4: 0.84 ng/dL (ref 0.60–1.60)

## 2016-01-16 NOTE — Patient Instructions (Signed)
Please stop at the lab.  Please restart vitamin D 2000 units daily.  Please continue Levothyroxine 112 mcg daily.  Take the thyroid hormone every day, with water, at least 30 minutes before breakfast, separated by at least 4 hours from: - acid reflux medications - calcium - iron - multivitamins  Please come back for a follow-up appointment in 4 months.

## 2016-01-16 NOTE — Progress Notes (Signed)
Patient ID: Kimberly [name], female   DOB: 1940-05-19, 74 y.o.   MRN: [id number]   HPI  Kimberly [name] is a 75 y.o.-year-old female, returning for f/u for hypercalcemia, dx 2012, vitamin D def., postsurgical hypothyroidism. Last OV 4 mo ago.  Primary hyperparathyroidism:  I reviewed pt's pertinent labs: Lab Results  Component Value Date   PTH 19 09/18/2015   PTH Comment 09/18/2015   PTH 21 02/08/2015   PTH Comment 02/08/2015   PTH 17 12/09/2013   PTH Comment 12/09/2013   PTH 14.9 08/24/2012   CALCIUM 10.2 12/20/2015   CALCIUM 9.4 12/06/2015   CALCIUM 10.6 (H) 11/22/2015   CALCIUM 9.6 10/02/2015   CALCIUM CANCELED 09/18/2015   CALCIUM 9.8 06/13/2015   CALCIUM 10.8 (H) 06/08/2015   CALCIUM 10.0 03/29/2015   CALCIUM 10.9 (H) 03/21/2015   CALCIUM CANCELED 02/08/2015  12/25/2014: Calcium 10.8 (8.6-10.3) 09/12/2013: Ca 10.8, iCa 5.7 (4.5-5.6)  Pt is not on HCTZ, but was on it in the past >> stopped 09/2013. Now on Lasix.   Reviewed pertinent labs:  Component     Latest Ref Rng 02/08/2015 02/08/2015         2:14 PM  2:14 PM  PTH     15 - 65 pg/mL  21  Calcium Ionized     1.12 - 1.32 mmol/L 1.37 (H)   VITD     30.00 - 100.00 ng/mL 28.65 (L)   Vitamin A (Retinoic Acid)     38 - 98 mcg/dL 89    Component     Latest Ref Rng 12/09/2013  Vitamin D 1, 25 (OH) Total     18 - 72 pg/mL 79 (H)  Vitamin D3 1, 25 (OH)      79  Vitamin D2 1, 25 (OH)      <8  VITD     30.00 - 100.00 ng/mL 50.59  PTH-Related Protein (PTH-RP)     14 - 27 pg/mL 16  Phosphorus     2.3 - 4.6 mg/dL 3.2   Urine calcium was high: Component     Latest Ref Rng 01/20/2014  Calcium, Ur      14  Calcium, 24 hour urine     100 - 250 mg/day 322 (H)  Creatinine, Urine      51.9  Creatinine, 24H Ur     700 - 1800 mg/day 1194   No osteoporosis, no fractures.  I reviewed pt's previous DEXA scan - 2008: LUMBAR SPINE (L1-L3)  BMD: 1.273  T-score (% of young adult value): 2.3  Z-score (% adult age match  value): 4.1  LEFT HIP (NECK)  BMD: 0.831  T-score (% of young adult value): -0.2  Z-score (% adult age match value): 1.4  Assessment: This patient is considered normal according to [facility]) criteria.   No h/o kidney stones.  + h/o CKD. Last BUN/Cr: Lab Results  Component Value Date   BUN 25 12/20/2015   CREATININE 1.08 (H) 12/20/2015    She has had a technetium sestamibi scan that was negative and a CT scan of her neck that was negative for possible parathyroid nodules, but showed a substernal goiter with Th narrowing.  H/o vitamin D deficiency: - Pt was on vitamin D 5000 IU daily - she stopped in 10/2013 (Dr Clover Mealy) - We started vitamin D 2000 units in 01/2015 as vit D was low >> she stopped this 1 mo ago.   - Last vitamin D: Lab Results  Component  Value Date   VD25OH 32.67 09/18/2015   VD25OH 28.65 (L) 02/08/2015   VD25OH 50.59 12/09/2013   S/p Thyroidectomy: -  She had thyroidectomy for substernal goiter on 06/12/2015 -  Benign, nodular, pathology  Postsurgical hypothyroidism: -  On levothyroxine 112 g daily since then.  Takes it: - in am - w/ water - stopped calcium in 08/2015 - but she was taking it along with the LT4! - + eats b'fast after an hour - no PPI, iron, MVI  Reviewed TFTs: Lab Results  Component Value Date   TSH 31.80 (H) 09/18/2015   TSH 1.128 08/25/2014   FREET4 0.83 09/18/2015  07/31/2015: TSH 58  Calcium normalized after her thyroid surgery: 06/13/2015: 9.8  She also has a history of HTN; HL; DM2; CKD - sees Dr Clover Mealy.  ROS: Constitutional: + weight gain, no fatigue, no subjective hyperthermia/hypothermia Eyes: no blurry vision, no xerophthalmia ENT: no sore throat, no nodules palpated in throat, no dysphagia/odynophagia, no hoarseness Cardiovascular: no CP/SOB/no palpitations/+ leg swelling Respiratory: no cough/SOB Gastrointestinal: no N/V/D/C Musculoskeletal: + muscle/+ joint aches (back pain) Skin: no  rashes Neurological: no tremors/numbness/tingling/dizziness  I reviewed pt's medications, allergies, PMH, social hx, family hx, and changes were documented in the history of present illness. Otherwise, unchanged from my initial visit note.  Past Medical History:  Diagnosis Date  . Acid reflux   . Anxiety   . Arthritis   . Back spasm   . Chronic diastolic heart failure ([place]) 03/20/2015  . Depression   . Diabetes mellitus without complication ([place])    diet controled  . Dietary noncompliance   . Edema extremities   . Frequency of urination   . Fuch's endothelial dystrophy    bilateral eyes  . Headache    occ  . Heart murmur    Echo 4/16: Mild concentric LVH, EF 55-60%, no RWMA, Gr 2 DD, mild LAE, trivial pericardial effusion  . HOH (hard of hearing)    wears hearing aid in left ear  . Hypercholesteremia   . Hypertensive heart disease with CHF ([place]) 09/11/2014  . Insomnia   . Normal coronary arteries 2010   cath with no obstructive epicardial disease.  Done for CP which was felt to be due to microvascular angina or diastolic HF  . OSA on CPAP    Followed by Dr. Keturah Barre   Past Surgical History:  Procedure Laterality Date  . ABDOMINAL HYSTERECTOMY    . APPENDECTOMY    . CARDIAC CATHETERIZATION     no CAD  . CESAREAN SECTION     x 2  . CHOLECYSTECTOMY    . COLONOSCOPY    . EYE SURGERY Right    implant  . ROTATOR CUFF REPAIR Left   . SHOULDER ARTHROSCOPY WITH SUBACROMIAL DECOMPRESSION AND BICEP TENDON REPAIR Left 08/24/2012   Procedure: SHOULDER ARTHROSCOPY WITH SUBACROMIAL DECOMPRESSION AND BICEP TENDON REPAIR;  Surgeon: Meredith Pel, MD;  Location: [place];  Service: Orthopedics;  Laterality: Left;  Left Shoulder Arthroscopy, Debridement, Biceps Tenotomy  . THYROIDECTOMY N/A 06/12/2015   Procedure: TOTAL THYROIDECTOMY;  Surgeon: Armandina Gemma, MD;  Location: [place];  Service: General;  Laterality: N/A;   History   Social History  . Marital Status: Married    Spouse  Name: N/A    Number of Children: 2   Occupational History  . retired-assistant teacher    Social History Main Topics  . Smoking status: Never Smoker   . Smokeless tobacco: Not on file  . Alcohol  Use: No  . Drug Use: No   Current Outpatient Prescriptions on File Prior to Visit  Medication Sig Dispense Refill  . aspirin 81 MG chewable tablet Chew 81 mg by mouth daily.    Marland Kitchen atorvastatin (LIPITOR) 80 MG tablet Take 1 tablet by mouth daily.    . butalbital-acetaminophen-caffeine (FIORICET, ESGIC) 50-325-40 MG per tablet Take 1-2 tablets by mouth as needed for headache or migraine.     . clonazePAM (KLONOPIN) 1 MG tablet Take 1 mg by mouth daily. Patient reports that she takes as needed    . cloNIDine (CATAPRES) 0.1 MG tablet Take 1 tablet (0.1 mg total) by mouth as needed (for systolic BP > 962). 30 tablet 11  . fenofibrate micronized (LOFIBRA) 200 MG capsule Take 200 mg by mouth daily before breakfast.    . furosemide (LASIX) 20 MG tablet Take 1 tablet (20 mg total) by mouth daily. 30 tablet 11  . levothyroxine (SYNTHROID, LEVOTHROID) 112 MCG tablet Take 112 mcg by mouth daily before breakfast.     . Lido-Capsaicin-Men-Methyl Sal (SOOTHEE EX) Apply 1 drop topically daily. Eye drops    . lisinopril (PRINIVIL,ZESTRIL) 40 MG tablet Take 1 tablet (40 mg total) by mouth daily. 90 tablet 11  . nitroGLYCERIN (NITROSTAT) 0.4 MG SL tablet Place 0.4 mg under the tongue every 5 (five) minutes as needed for chest pain.     . ONE TOUCH ULTRA TEST test strip     . ONETOUCH DELICA LANCETS 22L MISC     . PARoxetine (PAXIL) 20 MG tablet Take 60 mg by mouth every morning.    . potassium chloride SA (K-DUR,KLOR-CON) 20 MEQ tablet Take 1 tablet (20 mEq total) by mouth daily. 90 tablet 3  . traMADol (ULTRAM) 50 MG tablet Take 1-2 tablets (50-100 mg total) by mouth every 6 (six) hours as needed for moderate pain or severe pain. 15 tablet 0  . zolpidem (AMBIEN) 10 MG tablet Take 5 mg by mouth at bedtime. For  sleep     No current facility-administered medications on file prior to visit.    Allergies  Allergen Reactions  . Percocet [Oxycodone-Acetaminophen] Shortness Of Breath and Other (See Comments)    Pt couldn't breathe or catch her breath  . Codeine Other (See Comments)    hyperactivity  . Other Other (See Comments)    Some pain medication given at [facility] for surgery caused hallucinations   . Phenergan [Promethazine Hcl] Other (See Comments)    Hallucinations    Family History  Problem Relation Age of Onset  . Stomach cancer Mother   . Cancer Sister     intestinal   PE: BP (!) 142/82 (BP Location: Left Arm, Patient Position: Sitting)   Pulse (!) 55   Wt 193 lb (87.5 kg)   SpO2 97%   BMI 37.69 kg/m  Body mass index is 37.69 kg/m. Wt Readings from Last 3 Encounters:  01/16/16 193 lb (87.5 kg)  11/22/15 185 lb 8 oz (84.1 kg)  10/30/15 190 lb 4 oz (86.3 kg)   Constitutional: overweight, in NAD. No kyphosis. Eyes: PERRLA, EOMI, no exophthalmos ENT: moist mucous membranes,  Thyroid scar healing, without any keloid, erythema, dysesthesia, no cervical lymphadenopathy Cardiovascular: RRR, No MRG, + B pitting edema +3 Respiratory: CTA B Gastrointestinal: abdomen soft, NT, ND, BS+ Musculoskeletal: no deformities, strength intact in all 4 Skin: moist, warm, no rashes Neurological: no tremor with outstretched hands, DTR normal in all 4  Assessment: 1. Hypercalcemia - ? primary  hyperparathyroidism (PTH not increased...)  2. S/p thyroidectomy  3. Postsurgical hypothyroidism  4. Vit D insufficiency  Plan: 1. Hypercalcemia - Patient has had several instances of elevated calcium, with the highest level being at 11.1. Of note, her PTH levels were repeatedly checked and they were low normal in the setting of her hypercalcemia. A PTH-rp level was normal.  Multiple myeloma workup was negative.  Vitamin A level was normal. TSH levels were normal. She had a high 1, 25  dihydroxy vitamin D. Her vit D was normal or minimally low. Her phosphorus was normal, but her urinary calcium was high.  - Moreover, technetium sestamibi scan, thyroid ultrasound and CT of the neck did not show parathyroid masses - No apparent complications from hypercalcemia: no h/o nephrolithiasis, no abdominal pain, bone pain. -  Her calcium levels normalized after her thyroidectomy and a recent PTH and calcium levels were normal. - If the calcium is normal or slightly high in the future, I would just suggest to follow the levels and not intervene.   2. S/p thyroidectomy -  Thyroid scar healed -  She has no complaints after the thyroid surgery  3. Postsurgical hypothyroidism -  Patient had a very high TSH, a 58, while she was taking levothyroxine along with calcium in the morning. Calcium was stopped >> next TSH was still high, at 31. We did not change her LT4 dose >> Will recheck her TFTs today. -  Will continue her levothyroxine 112 g daily  -  We discussed at length about correct dosing of levothyroxine and I gave her written instruction about this: take the thyroid hormone every day, with water, at least 30 minutes before breakfast, separated by at least 4 hours from: - acid reflux medications - calcium - iron - multivitamins  4. Vit D insufficiency -  At last visit, since vitamin D was slightly low, we started 2000 units vitamin D daily. She stopped this 1 mo ago >> did not know she needs to continue... Will restart. - latest level was low normal in 09/2015 -  I will check another level today. Return in about 4 months (around 05/17/2016).   Office Visit on 01/16/2016  Component Date Value Ref Range Status  . Free T4 01/16/2016 0.84  0.60 - 1.60 ng/dL Final  . TSH 01/16/2016 41.60* 0.35 - 4.50 uIU/mL Final   TSH is high again. She maintains that she is taking the medication as advised. I will increase her levothyroxine to 137 g. We'll have her back for recheck in 2  months.  Needs refills LT4.   Philemon Kingdom, MD PhD [facility]

## 2016-01-17 ENCOUNTER — Encounter: Payer: Self-pay | Admitting: Internal Medicine

## 2016-01-18 ENCOUNTER — Telehealth: Payer: Self-pay

## 2016-01-18 DIAGNOSIS — E039 Hypothyroidism, unspecified: Secondary | ICD-10-CM

## 2016-01-18 MED ORDER — LEVOTHYROXINE SODIUM 137 MCG PO TABS: 137.0000 ug | ORAL_TABLET | Freq: Every day | ORAL | 0 refills | Status: DC

## 2016-01-18 NOTE — Telephone Encounter (Signed)
Called and left message for patient advising of abnormal TSH, and about new medication I had sent into pharmacy. Advised to call back to make lab appointment in 2 months, and gave call back number.

## 2016-01-21 ENCOUNTER — Telehealth: Payer: Self-pay | Admitting: Internal Medicine

## 2016-01-21 ENCOUNTER — Other Ambulatory Visit: Payer: Self-pay

## 2016-01-21 NOTE — Telephone Encounter (Signed)
Pt says the pharmacy is telling her they have not received the levothyroxine yet, please resend

## 2016-01-22 ENCOUNTER — Other Ambulatory Visit: Payer: Self-pay

## 2016-01-22 MED ORDER — LEVOTHYROXINE SODIUM 137 MCG PO TABS: 137.0000 ug | ORAL_TABLET | Freq: Every day | ORAL | 0 refills | Status: DC

## 2016-01-23 ENCOUNTER — Emergency Department (HOSPITAL_COMMUNITY)
Admission: EM | Admit: 2016-01-23 | Discharge: 2016-01-23 | Disposition: A | Discharge status: Home / Self Care | Payer: Medicare Other | Attending: Emergency Medicine | Admitting: Emergency Medicine

## 2016-01-23 ENCOUNTER — Encounter (HOSPITAL_COMMUNITY): Payer: Self-pay

## 2016-01-23 ENCOUNTER — Emergency Department (HOSPITAL_COMMUNITY): Payer: Medicare Other

## 2016-01-23 DIAGNOSIS — I110 Hypertensive heart disease with heart failure: Secondary | ICD-10-CM | POA: Diagnosis not present

## 2016-01-23 DIAGNOSIS — Z7982 Long term (current) use of aspirin: Secondary | ICD-10-CM | POA: Insufficient documentation

## 2016-01-23 DIAGNOSIS — I2510 Atherosclerotic heart disease of native coronary artery without angina pectoris: Secondary | ICD-10-CM | POA: Insufficient documentation

## 2016-01-23 DIAGNOSIS — Z951 Presence of aortocoronary bypass graft: Secondary | ICD-10-CM | POA: Diagnosis not present

## 2016-01-23 DIAGNOSIS — R079 Chest pain, unspecified: Secondary | ICD-10-CM | POA: Diagnosis not present

## 2016-01-23 DIAGNOSIS — I5032 Chronic diastolic (congestive) heart failure: Secondary | ICD-10-CM | POA: Insufficient documentation

## 2016-01-23 DIAGNOSIS — Z79899 Other long term (current) drug therapy: Secondary | ICD-10-CM | POA: Diagnosis not present

## 2016-01-23 LAB — TROPONIN I: Troponin I: <0.03 ng/mL (ref <0.03)

## 2016-01-23 LAB — BASIC METABOLIC PANEL
Anion gap: 12 (ref 5–15)
BUN: 19 mg/dL (ref 6–20)
CO2: 23 mmol/L (ref 22–32)
Calcium: 10.2 mg/dL (ref 8.9–10.3)
Chloride: 104 mmol/L (ref 101–111)
Creatinine, Ser: 1.09 mg/dL — ABNORMAL HIGH (ref 0.44–1.00)
GFR calc Af Amer: 57 mL/min — ABNORMAL LOW (ref >60)
GFR calc non Af Amer: 49 mL/min — ABNORMAL LOW (ref >60)
Glucose, Bld: 134 mg/dL — ABNORMAL HIGH (ref 65–99)
Potassium: 3.5 mmol/L (ref 3.5–5.1)
Sodium: 139 mmol/L (ref 135–145)

## 2016-01-23 LAB — CBC
HCT: 36.6 % (ref 36.0–46.0)
Hemoglobin: 11.4 g/dL — ABNORMAL LOW (ref 12.0–15.0)
MCH: 28.3 pg (ref 26.0–34.0)
MCHC: 31.1 g/dL (ref 30.0–36.0)
MCV: 90.8 fL (ref 78.0–100.0)
Platelets: 264 10*3/uL (ref 150–400)
RBC: 4.03 MIL/uL (ref 3.87–5.11)
RDW: 15.7 % — ABNORMAL HIGH (ref 11.5–15.5)
WBC: 7.5 10*3/uL (ref 4.0–10.5)

## 2016-01-23 LAB — I-STAT TROPONIN, ED: Troponin i, poc: 0 ng/mL (ref 0.00–0.08)

## 2016-01-23 NOTE — ED Provider Notes (Signed)
[facility]-EMERGENCY DEPT Provider Note   CSN: [id number] Arrival date & time: 01/23/16  1552 By signing my name below, I, Bridgette Habermann, attest that this documentation has been prepared under the direction and in the presence of Raeford Razor, MD. Electronically Signed: Bridgette Habermann, ED Scribe. 01/23/16. 8:41 PM.  History   Chief Complaint Chief Complaint  Patient presents with  . Shortness of Breath  . Chest Pain   HPI Comments: Kimberly [name] is a 75 y.o. female with h/o DM, HTN, CHF, and anxiety who presents to the Emergency Department complaining of intermittent shortness of breath onset one day ago. Pt states that she was at [facility] when she began to experience shortness of breath with chest tightness. She notes that she felt like she was going to vomit. Per pt, the chest tightness is resolved at this time but she still feels mild shortness of breath. Pt states these same symptoms came on one day ago and eventually resolved on it's own. She denies any modifying factors. Pt also has h/o hernias and ulcers but notes that these symptoms are not similar. Pt has h/o heart caterization 08/19/2008 done at [facility]. Her last stress test was May 2016, which was unremarkable. Pt denies trouble breathing, fever, or any other associated symptoms.   The history is provided by the patient. No language interpreter was used.    Past Medical History:  Diagnosis Date  . Acid reflux   . Anxiety   . Arthritis   . Back spasm   . Chronic diastolic heart failure (HCC) 03/20/2015  . Depression   . Diabetes mellitus without complication (HCC)    diet controled  . Dietary noncompliance   . Edema extremities   . Frequency of urination   . Fuch's endothelial dystrophy    bilateral eyes  . Headache    occ  . Heart murmur    Echo 4/16: Mild concentric LVH, EF 55-60%, no RWMA, Gr 2 DD, mild LAE, trivial pericardial effusion  . HOH (hard of hearing)    wears hearing aid in left ear  . Hypercholesteremia   .  Hypertensive heart disease with CHF (HCC) 09/11/2014  . Insomnia   . Normal coronary arteries 2010   cath with no obstructive epicardial disease.  Done for CP which was felt to be due to microvascular angina or diastolic HF  . OSA on CPAP    Followed by Dr. Fannie Knee    Patient Active Problem List   Diagnosis Date Noted  . Essential hypertension 10/30/2015  . Edema extremities 09/25/2015  . Dietary noncompliance 09/25/2015  . S/P thyroidectomy 09/18/2015  . Postsurgical hypothyroidism 09/18/2015  . Vitamin D insufficiency 09/18/2015  . Chronic diastolic heart failure (HCC) 03/20/2015  . Elevated troponin 09/11/2014  . Hypertensive heart disease 09/11/2014  . Rhinitis sicca 07/02/2014  . Hypercalcemia 12/09/2013  . Obstructive sleep apnea 08/29/2013  . Acid reflux     Past Surgical History:  Procedure Laterality Date  . ABDOMINAL HYSTERECTOMY    . APPENDECTOMY    . CARDIAC CATHETERIZATION     no CAD  . CESAREAN SECTION     x 2  . CHOLECYSTECTOMY    . COLONOSCOPY    . EYE SURGERY Right    implant  . ROTATOR CUFF REPAIR Left   . SHOULDER ARTHROSCOPY WITH SUBACROMIAL DECOMPRESSION AND BICEP TENDON REPAIR Left 08/24/2012   Procedure: SHOULDER ARTHROSCOPY WITH SUBACROMIAL DECOMPRESSION AND BICEP TENDON REPAIR;  Surgeon: Cammy Copa, MD;  Location: [facility]  OR;  Service: Orthopedics;  Laterality: Left;  Left Shoulder Arthroscopy, Debridement, Biceps Tenotomy  . THYROIDECTOMY N/A 06/12/2015   Procedure: TOTAL THYROIDECTOMY;  Surgeon: Darnell Level, MD;  Location: [facility] OR;  Service: General;  Laterality: N/A;    OB History    No data available       Home Medications    Prior to Admission medications   Medication Sig Start Date End Date Taking? Authorizing Provider  aspirin 81 MG chewable tablet Chew 81 mg by mouth daily.    Historical Provider, MD  atorvastatin (LIPITOR) 80 MG tablet Take 1 tablet by mouth daily. 04/09/15   Historical Provider, MD    butalbital-acetaminophen-caffeine (FIORICET, ESGIC) 50-325-40 MG per tablet Take 1-2 tablets by mouth as needed for headache or migraine.  08/28/14   Historical Provider, MD  clonazePAM (KLONOPIN) 1 MG tablet Take 1 mg by mouth daily. Patient reports that she takes as needed 03/20/15   Historical Provider, MD  cloNIDine (CATAPRES) 0.1 MG tablet Take 1 tablet (0.1 mg total) by mouth as needed (for systolic BP > 170). 11/22/15   Quintella Reichert, MD  fenofibrate micronized (LOFIBRA) 200 MG capsule Take 200 mg by mouth daily before breakfast.    Historical Provider, MD  furosemide (LASIX) 20 MG tablet Take 1 tablet (20 mg total) by mouth daily. 12/14/15   Quintella Reichert, MD  levothyroxine (SYNTHROID, LEVOTHROID) 137 MCG tablet Take 1 tablet (137 mcg total) by mouth daily before breakfast. 01/22/16   Carlus Pavlov, MD  Lido-Capsaicin-Men-Methyl Sal (SOOTHEE EX) Apply 1 drop topically daily. Eye drops    Historical Provider, MD  lisinopril (PRINIVIL,ZESTRIL) 40 MG tablet Take 1 tablet (40 mg total) by mouth daily. 10/09/15   Quintella Reichert, MD  metoprolol succinate (TOPROL-XL) 25 MG 24 hr tablet  12/28/15   Historical Provider, MD  nitroGLYCERIN (NITROSTAT) 0.4 MG SL tablet Place 0.4 mg under the tongue every 5 (five) minutes as needed for chest pain.     Historical Provider, MD  ONE TOUCH ULTRA TEST test strip  07/03/14   Historical Provider, MD  Dola Argyle LANCETS 33G MISC  06/29/14   Historical Provider, MD  PARoxetine (PAXIL) 20 MG tablet Take 60 mg by mouth every morning.    Historical Provider, MD  potassium chloride SA (K-DUR,KLOR-CON) 20 MEQ tablet Take 1 tablet (20 mEq total) by mouth daily. 03/22/15   Quintella Reichert, MD  traMADol (ULTRAM) 50 MG tablet Take 1-2 tablets (50-100 mg total) by mouth every 6 (six) hours as needed for moderate pain or severe pain. 06/13/15   Darnell Level, MD  zolpidem (AMBIEN) 10 MG tablet Take 5 mg by mouth at bedtime. For sleep    Historical Provider, MD    Family  History Family History  Problem Relation Age of Onset  . Stomach cancer Mother   . Cancer Sister     intestinal    Social History Social History  Substance Use Topics  . Smoking status: Never Smoker  . Smokeless tobacco: Never Used  . Alcohol use No     Allergies   Percocet [oxycodone-acetaminophen]; Codeine; Other; and Phenergan [promethazine hcl]   Review of Systems Review of Systems  Constitutional: Negative for fever.  Respiratory: Positive for chest tightness and shortness of breath.   Gastrointestinal: Positive for nausea.   Physical Exam Updated Vital Signs BP 173/56   Pulse 60   Temp 98.2 F (36.8 C) (Oral)   Resp 18   Ht 5' (1.524 m)  Wt 193 lb (87.5 kg)   SpO2 99%   BMI 37.69 kg/m   Physical Exam  Constitutional: She is oriented to person, place, and time. She appears well-developed and well-nourished. No distress.  HENT:  Head: Normocephalic and atraumatic.  Nose: Nose normal.  Mouth/Throat: Oropharynx is clear and moist. No oropharyngeal exudate.  Eyes: Conjunctivae and EOM are normal. Pupils are equal, round, and reactive to light. No scleral icterus.  Neck: Normal range of motion. Neck supple. No JVD present. No tracheal deviation present. No thyromegaly present.  Cardiovascular: Normal rate, regular rhythm and normal heart sounds.  Exam reveals no gallop and no friction rub.   No murmur heard. Pulmonary/Chest: Effort normal and breath sounds normal. No respiratory distress. She has no wheezes. She exhibits no tenderness.  Abdominal: Soft. Bowel sounds are normal. She exhibits no distension and no mass. There is no tenderness. There is no rebound and no guarding.  Musculoskeletal: Normal range of motion. She exhibits no edema or tenderness.  Lymphadenopathy:    She has no cervical adenopathy.  Neurological: She is alert and oriented to person, place, and time. No cranial nerve deficit. She exhibits normal muscle tone.  Skin: Skin is warm and dry.  No rash noted. No erythema. No pallor.  Nursing note and vitals reviewed.    ED Treatments / Results  DIAGNOSTIC STUDIES: Oxygen Saturation is 99% on RA, normal by my interpretation.    COORDINATION OF CARE: 8:40 PM Discussed treatment plan with pt at bedside which includes repeat Troponin and pt agreed to plan.  Labs (all labs ordered are listed, but only abnormal results are displayed) Labs Reviewed  BASIC METABOLIC PANEL - Abnormal; Notable for the following:       Result Value   Glucose, Bld 134 (*)    Creatinine, Ser 1.09 (*)    GFR calc non Af Amer 49 (*)    GFR calc Af Amer 57 (*)    All other components within normal limits  CBC - Abnormal; Notable for the following:    Hemoglobin 11.4 (*)    RDW 15.7 (*)    All other components within normal limits  I-STAT TROPOININ, ED    EKG  EKG Interpretation  Date/Time:  Wednesday January 23 2016 16:00:17 EDT Ventricular Rate:  61 PR Interval:  154 QRS Duration: 94 QT Interval:  390 QTC Calculation: 392 R Axis:   21 Text Interpretation:  Sinus rhythm with Premature supraventricular complexes Nonspecific ST and T wave abnormality Abnormal ECG Confirmed by Juleen China  MD, Camdyn Beske [phone]) on 01/23/2016 8:30:55 PM       Radiology Dg Chest 2 View  Result Date: 01/23/2016 CLINICAL DATA:  Chest pain and shortness of breath EXAM: CHEST  2 VIEW COMPARISON:  06/08/2015 FINDINGS: Moderate cardiac enlargement noted. There is pulmonary vascular congestion. No pleural effusion. No airspace opacity. Degenerative disc disease is noted within the thoracic spine. IMPRESSION: 1. Cardiac enlargement and pulmonary vascular congestion. Electronically Signed   By: Signa Kell M.D.   On: 01/23/2016 17:18    Procedures Procedures (including critical care time)  Medications Ordered in ED Medications - No data to display   Initial Impression / Assessment and Plan / ED Course  I have reviewed the triage vital signs and the nursing  notes.  Pertinent labs & imaging results that were available during my care of the patient were reviewed by me and considered in my medical decision making (see chart for details).  Clinical Course  Final Clinical Impressions(s) / ED Diagnoses   Final diagnoses:  Chest pain, unspecified chest pain type    New Prescriptions New Prescriptions   No medications on file    I personally preformed the services scribed in my presence. The recorded information has been reviewed is accurate. Raeford RazorStephen Malachi Kinzler, MD.    Raeford RazorStephen Cherith Tewell, MD 01/27/16 [phone]

## 2016-01-23 NOTE — ED Notes (Signed)
Mid-chest pain since yesterday with nausea and  Sob  None now  No pain either

## 2016-01-23 NOTE — ED Triage Notes (Signed)
[facility]. She reports a brief episode of chest pain that is now resolved. Pt tearful in triage.

## 2016-01-23 NOTE — ED Notes (Addendum)
Pt family extremely upset about the wait, delay was explained to the pt and family. Family is still upset.

## 2016-04-15 ENCOUNTER — Ambulatory Visit: Payer: Medicare Other | Admitting: Internal Medicine

## 2016-07-11 ENCOUNTER — Ambulatory Visit: Payer: Medicare Other | Admitting: Internal Medicine

## 2016-07-15 ENCOUNTER — Ambulatory Visit: Payer: Medicare Other | Admitting: Internal Medicine

## 2016-07-22 ENCOUNTER — Other Ambulatory Visit: Payer: Self-pay | Admitting: Internal Medicine

## 2016-08-21 ENCOUNTER — Ambulatory Visit: Payer: Medicare Other | Admitting: Internal Medicine

## 2016-09-15 ENCOUNTER — Other Ambulatory Visit: Payer: Self-pay | Admitting: Internal Medicine

## 2016-09-30 ENCOUNTER — Encounter: Payer: Self-pay | Admitting: Internal Medicine

## 2016-10-01 ENCOUNTER — Encounter: Payer: Self-pay | Admitting: Internal Medicine

## 2016-10-01 ENCOUNTER — Ambulatory Visit (INDEPENDENT_AMBULATORY_CARE_PROVIDER_SITE_OTHER): Payer: Medicare Other | Admitting: Internal Medicine

## 2016-10-01 DIAGNOSIS — G4733 Obstructive sleep apnea (adult) (pediatric): Secondary | ICD-10-CM

## 2016-10-01 DIAGNOSIS — Z91119 Patient's noncompliance with dietary regimen due to unspecified reason: Secondary | ICD-10-CM

## 2016-10-01 DIAGNOSIS — Z9111 Patient's noncompliance with dietary regimen: Secondary | ICD-10-CM

## 2016-10-01 NOTE — Patient Instructions (Signed)
We can continue CPAP 15, mask of choice, humidifier, supplies, AirView     Dx OSA  You might ask your DME company if you can shorten the "Ramp" time, so your machine gets up to comfortable pressure a little more quickly when you first put it on.   Please call if we can help

## 2016-10-01 NOTE — Progress Notes (Signed)
HPI female  followed for OSA, complicated by GERD, dCHF, DM2, hypothyroidnever smoker NPSG 12/23/97- AHI 57/ hr, desaturation to 78%, body weight 163 lbs  ---------------------------------------------------------------------------------------------------  04/16/2015-76 year old female never smoker followed for OSA, complicated by GERD, dCHF, DM2 FOLLOWS FOR: Pt wears CPAP machine every night for about 9-10 hours. Put order in to change from [facility] to [facility] for CPAP and supplies. CPAP 13/[facility]>> [facility] no longer participates with Medicare and her region so she needs to [facility] to [facility] and is actively needing replacement CPAP supplies. She has remained very compliant and sleeps better with CPAP. No problems recognized.  10/01/16-76 year old female never smoker followed for OSA, complicated by GERD, dCHF, DM2, hypothyroid/thyroidectomy for goiter CPAP 15/ [employer] FOLLOW UP FOR patient states that when the CPAP first comes on she feels it does not have enough air coming out of it. patient states DME is [facility]  Download 100%/4 hours, AHI 1.1/hour. Machine is 76-year-old. She says she sleeps "great" with CPAP and can't even nap without it. Definitely feels the benefits her.  ROS-see HPI Constitutional:   No-   weight loss, night sweats, fevers, chills, fatigue, lassitude. HEENT:   + headaches, No-difficulty swallowing, tooth/dental problems, sore throat,       +sneezing, itching, ear ache, nasal congestion, post nasal drip,  CV:  No-chest pain, orthopnea, PND, swelling in lower extremities, anasarca, dizziness, +palpitations Resp: +shortness of breath with exertion or at rest.              No-   productive cough,  No non-productive cough,  No- coughing up of blood.              No-   change in color of mucus.  No- wheezing.   Skin: No-   rash or lesions. GI:  No-   heartburn, +indigestion, abdominal pain, nausea, vomiting,  GU:  MS:  No-   joint pain or swelling.   Neuro-     nothing  unusual Psych:  No- change in mood or affect. + depression or anxiety.  No memory loss.  OBJ- Physical Exam General- Alert, Oriented, Affect-appropriate, Distress- none acute, + obese Skin- rash-none, lesions- none, excoriation- none Lymphadenopathy- none Head- atraumatic            Eyes- Gross vision intact, PERRLA, conjunctivae and secretions clear            Ears- +L hearing aid            Nose- Clear, no-Septal dev, mucus, polyps, erosion, perforation             Throat- Mallampati II-III , mucosa clear , drainage- none, tonsils- atrophic Neck- flexible , trachea midline, no stridor , carotid no bruit Chest - symmetrical excursion , unlabored           Heart/CV- RRR , no murmur , no gallop  , no rub, nl s1 s2                           - JVD- none , edema- none, stasis changes- none, varices- none           Lung- clear to P&A, wheeze- none, cough- none , dullness-none, rub- none           Chest wall-  Abd-  Br/ Gen/ Rectal- Not done, not indicated Extrem- cyanosis- none, clubbing, none, atrophy- none, strength- nl Neuro- grossly intact to observation

## 2016-10-06 NOTE — Assessment & Plan Note (Signed)
Weight loss would help us with management of her OSA as discussed.

## 2016-10-06 NOTE — Assessment & Plan Note (Signed)
Download confirms her report of excellent compliance and control. She clearly benefits from CPAP. She does suggest maybe ramp needs to be shortened. I don't think pressure needs adjustment based on current information.

## 2016-10-23 ENCOUNTER — Other Ambulatory Visit: Payer: Self-pay | Admitting: Cardiology

## 2016-10-27 ENCOUNTER — Other Ambulatory Visit: Payer: Self-pay | Admitting: Cardiology

## 2016-11-16 ENCOUNTER — Other Ambulatory Visit: Payer: Self-pay | Admitting: Internal Medicine

## 2016-11-16 ENCOUNTER — Other Ambulatory Visit: Payer: Self-pay | Admitting: Cardiology

## 2016-11-17 IMAGING — DX DG CHEST 2V
2 series · 2 of 2 positions shown · non-contrast
Comparison: 06/08/2015

CLINICAL DATA: Chest pain and shortness of breath

EXAM:
CHEST  2 VIEW

[chest pa]
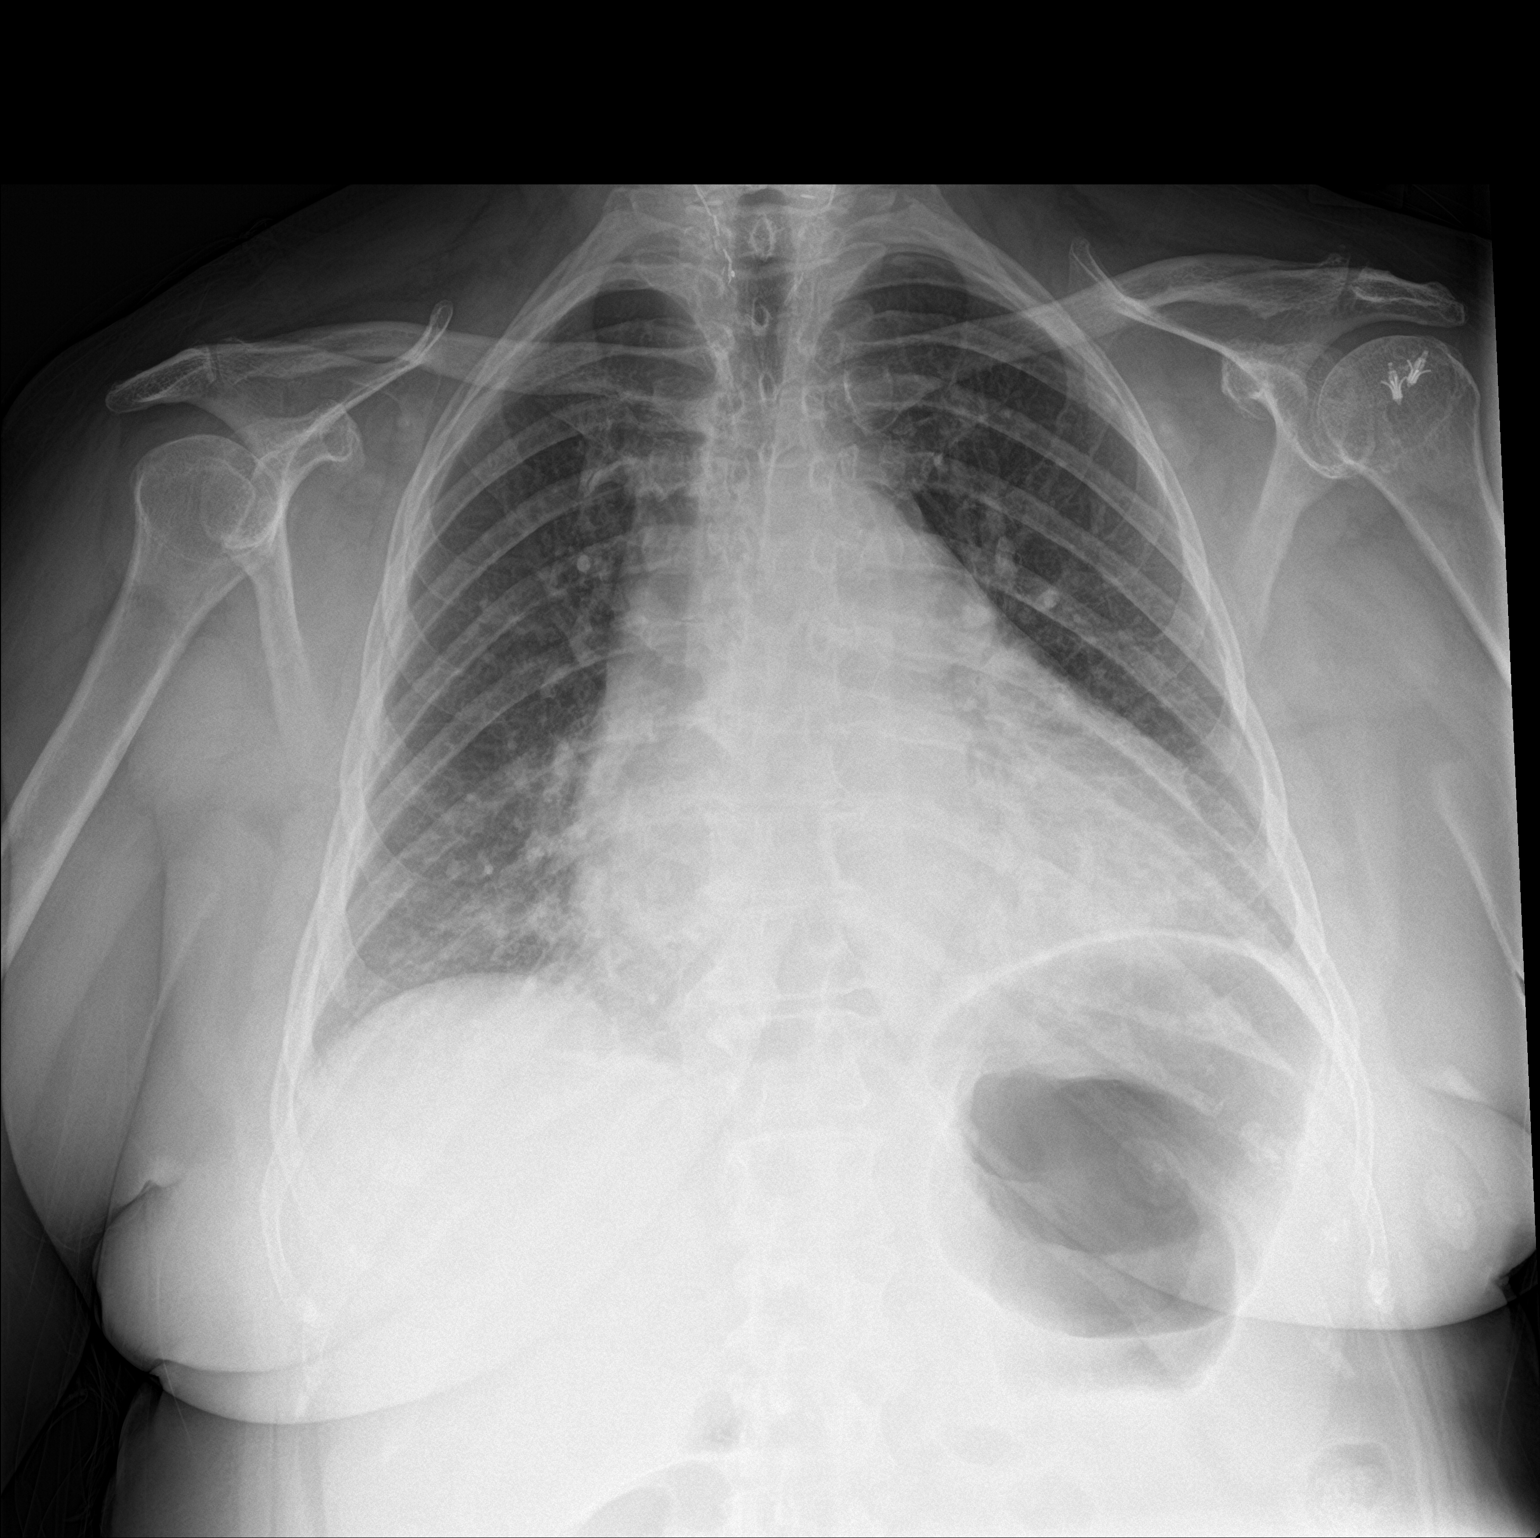

[chest lat]
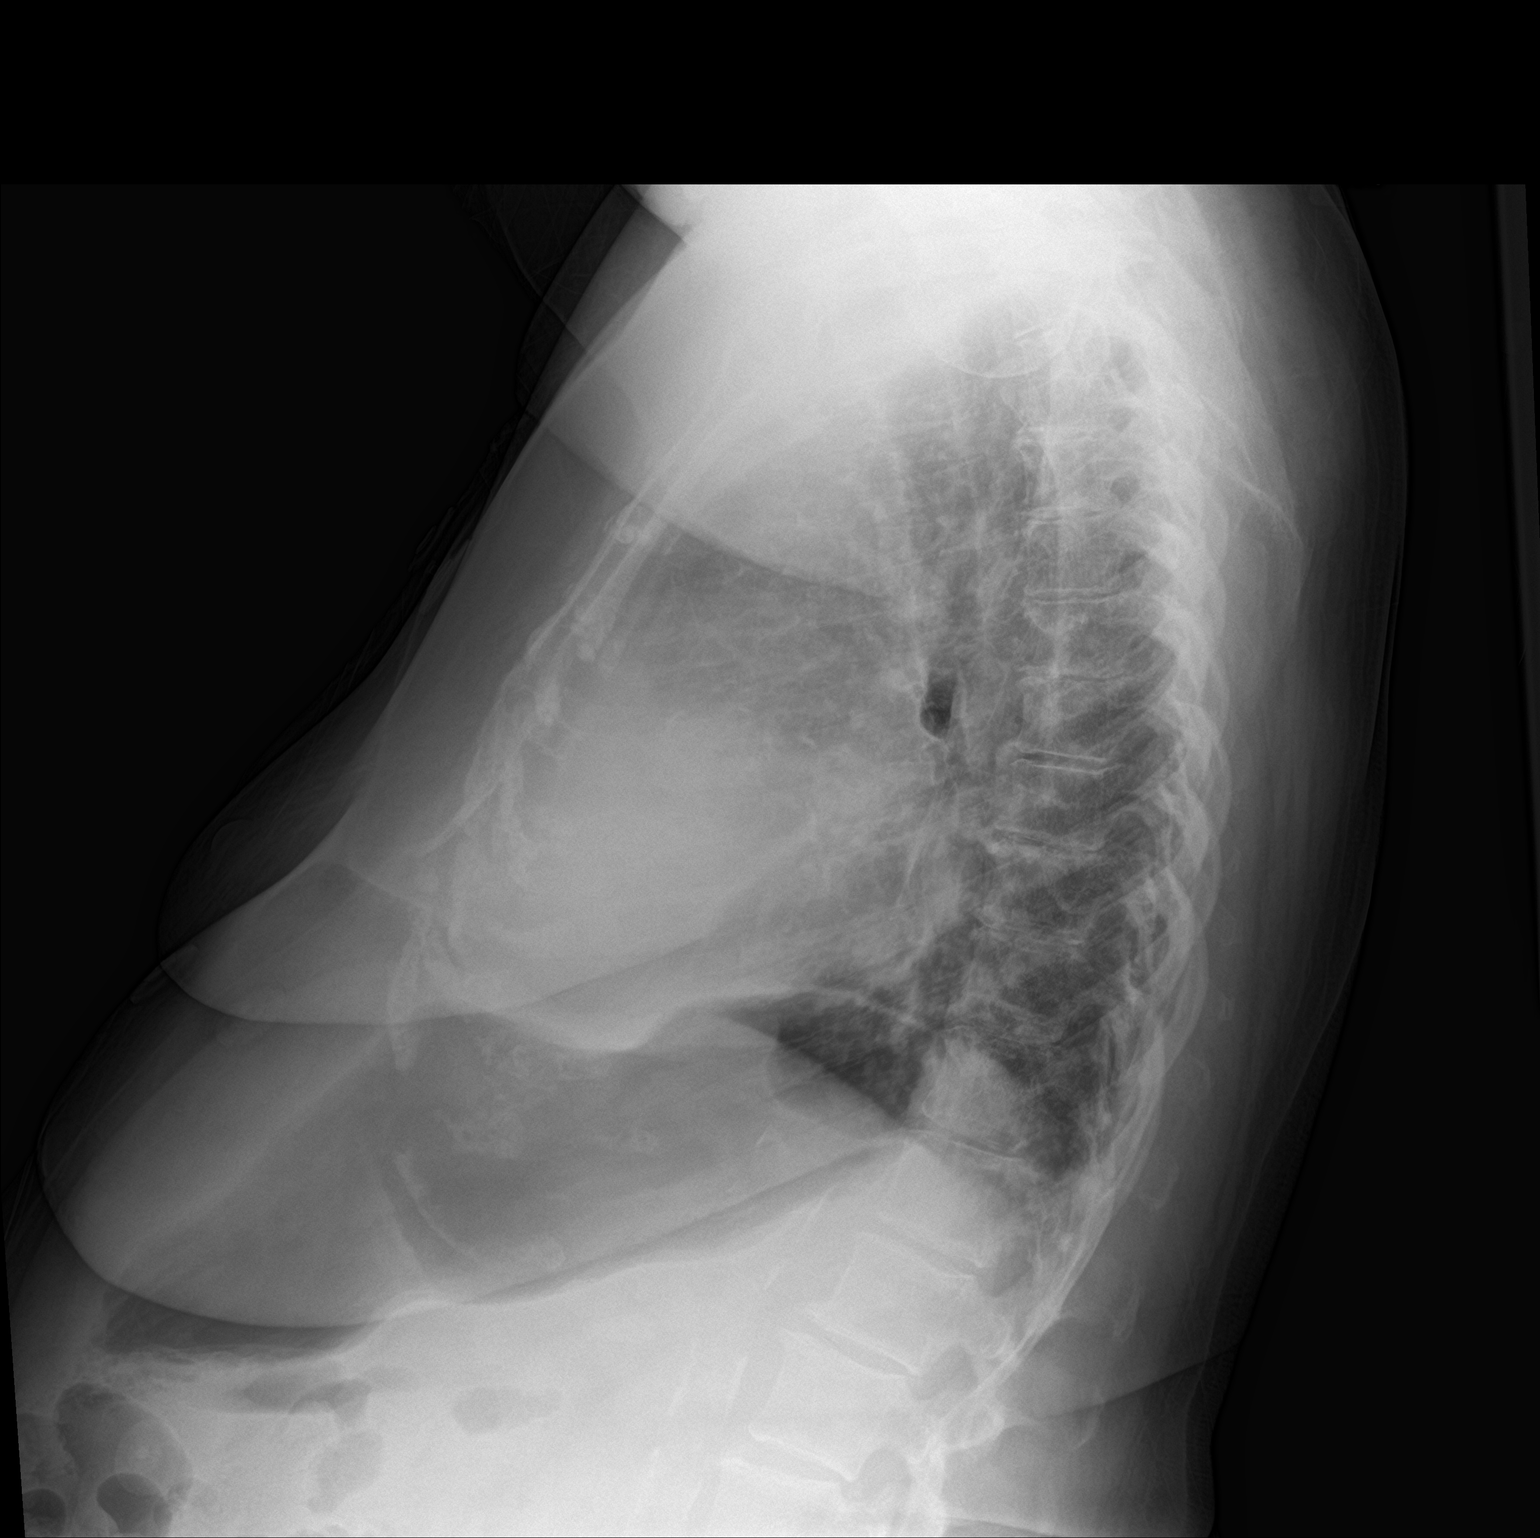

[2 of 2 positions shown; findings below may reference images not displayed]

FINDINGS: Moderate cardiac enlargement noted. There is pulmonary vascular
congestion. No pleural effusion. No airspace opacity. Degenerative
disc disease is noted within the thoracic spine.
IMPRESSION: 1. Cardiac enlargement and pulmonary vascular congestion.

## 2016-11-17 NOTE — Telephone Encounter (Signed)
Last seen 01/16/16, no other appointments made. Okay to refill? Thanks!

## 2016-11-17 NOTE — Telephone Encounter (Signed)
Noted. Notified pharmacy on the refill.

## 2016-11-17 NOTE — Telephone Encounter (Signed)
oK to refill, but needs to come back for a visit in Sept

## 2016-11-25 ENCOUNTER — Other Ambulatory Visit: Payer: Self-pay | Admitting: Cardiology

## 2016-12-25 ENCOUNTER — Other Ambulatory Visit: Payer: Self-pay | Admitting: Cardiology

## 2017-01-06 ENCOUNTER — Other Ambulatory Visit: Payer: Self-pay | Admitting: Internal Medicine

## 2017-01-06 NOTE — Telephone Encounter (Signed)
No, she was lost for f/u >> she will need to get refills from PCP

## 2017-01-06 NOTE — Telephone Encounter (Signed)
Last seen 01/2016, no other appointments made. Okay to refill?

## 2017-01-06 NOTE — Telephone Encounter (Signed)
Refused ? ?

## 2017-01-13 ENCOUNTER — Ambulatory Visit (INDEPENDENT_AMBULATORY_CARE_PROVIDER_SITE_OTHER): Payer: Medicare Other | Admitting: Cardiology

## 2017-01-13 ENCOUNTER — Encounter (INDEPENDENT_AMBULATORY_CARE_PROVIDER_SITE_OTHER): Payer: Self-pay

## 2017-01-13 ENCOUNTER — Encounter: Payer: Self-pay | Admitting: Cardiology

## 2017-01-13 VITALS — BP 166/80 | HR 65 | Ht 60.0 in | Wt 193.4 lb

## 2017-01-13 DIAGNOSIS — R600 Localized edema: Secondary | ICD-10-CM

## 2017-01-13 DIAGNOSIS — I110 Hypertensive heart disease with heart failure: Secondary | ICD-10-CM

## 2017-01-13 DIAGNOSIS — I5032 Chronic diastolic (congestive) heart failure: Secondary | ICD-10-CM

## 2017-01-13 LAB — BASIC METABOLIC PANEL
BUN / CREAT RATIO: 29 — AB (ref 12–28)
BUN: 28 mg/dL — ABNORMAL HIGH (ref 8–27)
CALCIUM: 9.9 mg/dL (ref 8.7–10.3)
CHLORIDE: 99 mmol/L (ref 96–106)
CO2: 26 mmol/L (ref 20–29)
CREATININE: 0.96 mg/dL (ref 0.57–1.00)
GFR calc Af Amer: 67 mL/min/{1.73_m2} (ref >59)
GFR calc non Af Amer: 58 mL/min/{1.73_m2} — ABNORMAL LOW (ref >59)
GLUCOSE: 141 mg/dL — AB (ref 65–99)
Potassium: 3.9 mmol/L (ref 3.5–5.2)
Sodium: 141 mmol/L (ref 134–144)

## 2017-01-13 MED ORDER — CLONIDINE HCL 0.1 MG PO TABS: 0.1000 mg | ORAL_TABLET | Freq: Two times a day (BID) | ORAL | 11 refills | Status: DC

## 2017-01-13 NOTE — Progress Notes (Signed)
Cardiology Office Note:    Date:  01/13/2017   ID:  Kimberly [name], DOB 1941-03-07, MRN [id number]  PCP:  Clayborn Heron, MD  Cardiologist:  Armanda Magic, MD   Referring MD: Clayborn Heron, MD   Chief Complaint  Patient presents with  . Congestive Heart Failure  . Hypertension    History of Present Illness:    Kimberly [name] is a 76 y.o. female with a hx of  chronic diastolic CHF and HTN. She is doing well today.   She has chronic LE edema which remains stable. She had a cath in 2010 due to CP showing normal coronary arteries with elevated LVEDP felt to be due to diastolic CHF vs. Microvascular angina. Her CP resolved for a while but reoccurred and nuclear stress test 09/2014 was normal. She has not had any recent CP or pressure.  She does occassionaly have some DOE when walking. She has OSA and is on CPAP followup by Pulmonary.  She denies any PND, orthopnea, palpitations or syncope. She has chronic LE edema which is stable but she has had issues in the past related to dietary noncompliance with sodium.    Past Medical History:  Diagnosis Date  . Acid reflux   . Anxiety   . Arthritis   . Back spasm   . Chronic diastolic heart failure (HCC) 03/20/2015  . Depression   . Diabetes mellitus without complication (HCC)    diet controled  . Dietary noncompliance   . Edema extremities   . Frequency of urination   . Fuch's endothelial dystrophy    bilateral eyes  . Headache    occ  . HOH (hard of hearing)    wears hearing aid in left ear  . Hypercholesteremia   . Hypertensive heart disease with CHF (HCC) 09/11/2014  . Insomnia   . Normal coronary arteries 2010   cath with no obstructive epicardial disease.  Done for CP which was felt to be due to microvascular angina or diastolic HF  . OSA on CPAP    Followed by Dr. Fannie Knee    Past Surgical History:  Procedure Laterality Date  . ABDOMINAL HYSTERECTOMY    . APPENDECTOMY    . CARDIAC CATHETERIZATION     no CAD   . CESAREAN SECTION     x 2  . CHOLECYSTECTOMY    . COLONOSCOPY    . EYE SURGERY Right    implant  . ROTATOR CUFF REPAIR Left   . SHOULDER ARTHROSCOPY WITH SUBACROMIAL DECOMPRESSION AND BICEP TENDON REPAIR Left 08/24/2012   Procedure: SHOULDER ARTHROSCOPY WITH SUBACROMIAL DECOMPRESSION AND BICEP TENDON REPAIR;  Surgeon: Cammy Copa, MD;  Location: [facility] OR;  Service: Orthopedics;  Laterality: Left;  Left Shoulder Arthroscopy, Debridement, Biceps Tenotomy  . THYROIDECTOMY N/A 06/12/2015   Procedure: TOTAL THYROIDECTOMY;  Surgeon: Darnell Level, MD;  Location: [facility] OR;  Service: General;  Laterality: N/A;    Current Medications: Current Meds  Medication Sig  . amLODipine (NORVASC) 5 MG tablet TAKE 1 TABLET BY MOUTH ONCE DAILY  . aspirin 81 MG chewable tablet Chew 81 mg by mouth daily.  Marland Kitchen atorvastatin (LIPITOR) 80 MG tablet Take 1 tablet by mouth daily.  . clonazePAM (KLONOPIN) 1 MG tablet Take 1 mg by mouth at bedtime. Patient reports that she takes as needed  . fenofibrate micronized (LOFIBRA) 200 MG capsule Take 200 mg by mouth daily before breakfast.  . furosemide (LASIX) 20 MG tablet TAKE ONE TABLET BY MOUTH ONCE  DAILY  . levothyroxine (SYNTHROID, LEVOTHROID) 137 MCG tablet TAKE 1 TABLET BY MOUTH ONCE DAILY BEFORE BREAKFAST *KEEP  APPOINTMENT  FOR  FURTHER  REFILLS*  . Lido-Capsaicin-Men-Methyl Sal (SOOTHEE EX) Apply 1 drop topically daily. Eye drops  . lisinopril (PRINIVIL,ZESTRIL) 40 MG tablet Take 1 tablet (40 mg total) by mouth daily.  . metoprolol succinate (TOPROL-XL) 25 MG 24 hr tablet TAKE 1 TABLET BY MOUTH ONCE DAILY  . nitroGLYCERIN (NITROSTAT) 0.4 MG SL tablet Place 0.4 mg under the tongue every 5 (five) minutes as needed for chest pain.   Marland Kitchen PARoxetine (PAXIL) 20 MG tablet Take 60 mg by mouth every morning.  . traMADol (ULTRAM) 50 MG tablet Take 1-2 tablets (50-100 mg total) by mouth every 6 (six) hours as needed for moderate pain or severe pain.  Marland Kitchen zolpidem (AMBIEN) 10 MG  tablet Take 5 mg by mouth at bedtime. For sleep     Allergies:   Percocet [oxycodone-acetaminophen]; Codeine; Other; and Phenergan [promethazine hcl]   Social History   Social History  . Marital status: Married    Spouse name: N/A  . Number of children: 2  . Years of education: N/A   Occupational History  . retired-assistant teacher    Social History Main Topics  . Smoking status: Never Smoker  . Smokeless tobacco: Never Used  . Alcohol use No  . Drug use: No  . Sexual activity: Not on file   Other Topics Concern  . Not on file   Social History Narrative  . No narrative on file     Family History: The patient's family history includes Cancer in her sister; Stomach cancer in her mother.  ROS:   Please see the history of present illness.     All other systems reviewed and are negative.  EKGs/Labs/Other Studies Reviewed:    The following studies were reviewed today: none  EKG:  EKG is  ordered today.  The ekg ordered today demonstrates NSR at 65bpm with nonspecific ST abnormality  Recent Labs: 01/16/2016: TSH 41.60 01/23/2016: BUN 19; Creatinine, Ser 1.09; Hemoglobin 11.4; Platelets 264; Potassium 3.5; Sodium 139   Recent Lipid Panel    Component Value Date/Time   CHOL 152 10/02/2015 0901   TRIG 169 (H) 10/02/2015 0901   HDL 30 (L) 10/02/2015 0901   CHOLHDL 5.1 (H) 10/02/2015 0901   VLDL 34 (H) 10/02/2015 0901   LDLCALC 88 10/02/2015 0901    Physical Exam:    VS:  BP (!) 166/80   Pulse 65   Ht 5' (1.524 m)   Wt 193 lb 6.4 oz (87.7 kg)   BMI 37.77 kg/m     Wt Readings from Last 3 Encounters:  01/13/17 193 lb 6.4 oz (87.7 kg)  10/01/16 189 lb 3.2 oz (85.8 kg)  01/23/16 193 lb (87.5 kg)     GEN:  Well nourished, well developed in no acute distress HEENT: Normal NECK: No JVD; No carotid bruits LYMPHATICS: No lymphadenopathy CARDIAC: RRR, no murmurs, rubs, gallops RESPIRATORY:  Clear to auscultation without rales, wheezing or rhonchi  ABDOMEN:  Soft, non-tender, non-distended MUSCULOSKELETAL:  No edema; No deformity  SKIN: Warm and dry NEUROLOGIC:  Alert and oriented x 3 PSYCHIATRIC:  Normal affect   ASSESSMENT:    1. Chronic diastolic heart failure (HCC)   2. Hypertensive heart disease with chronic diastolic congestive heart failure (HCC)   3. Edema extremities    PLAN:    In order of problems listed above:  1. Chronic diastolic CHF-  she appears euvolemic on exam today.  Her weight is stable.  I again encouraged her to follow a low sodium diet.  She will continue on diuretics.  2. HTN - her BP is elevated today on exam today. She says that she has to go with her husband to the Oncologist today to find out what her husband is going to have to do for his newly dx prostate CA and she is very anxious. Her BP usually runs around 140-160/55-5360mmHg.  Her amlodipine was decreased last OV due to edema.  She will continue on amlodipine 5mg  daily, Toprol XL 25mg  daily, Lisinopril 40mg  daily.  I have instructed her to take Clonidine 0.1mg  BID and followup in HTN clinic in 1 week.   3. Chronic LE edema driven by dietary noncompliance with sodium.  I reinforced following a 2gm sodium diet.  She will continue Lasix 20mg  daily.  I will check a BMET today.    Medication Adjustments/Labs and Tests Ordered: Current medicines are reviewed at length with the patient today.  Concerns regarding medicines are outlined above.  No orders of the defined types were placed in this encounter.  No orders of the defined types were placed in this encounter.   Signed, Armanda Magicraci Olander Friedl, MD  01/13/2017 9:37 AM    [facility]

## 2017-01-13 NOTE — Patient Instructions (Signed)
Medication Instructions:  1) START CLONIDINE 0.1 mg TWICE DAILY  Labwork: TODAY: BMET  Testing/Procedures: None  Follow-Up: Your provider recommends that you schedule a follow-up appointment in 2 weeks in the HTN CLINIC.  Your provider wants you to follow-up in: 6 months with Dr. Mayford Knifeurner. You will receive a reminder letter in the mail two months in advance. If you don't receive a letter, please call our office to schedule the follow-up appointment.    Any Other Special Instructions Will Be Listed Below (If Applicable).     If you need a refill on your cardiac medications before your next appointment, please call your pharmacy.

## 2017-01-16 ENCOUNTER — Other Ambulatory Visit: Payer: Self-pay | Admitting: Internal Medicine

## 2017-01-16 ENCOUNTER — Other Ambulatory Visit: Payer: Self-pay | Admitting: Cardiology

## 2017-01-27 ENCOUNTER — Ambulatory Visit: Payer: Medicare Other

## 2017-03-03 ENCOUNTER — Other Ambulatory Visit: Payer: Self-pay | Admitting: Cardiology

## 2017-03-27 ENCOUNTER — Other Ambulatory Visit: Payer: Self-pay | Admitting: Internal Medicine

## 2017-05-08 ENCOUNTER — Other Ambulatory Visit: Payer: Self-pay | Admitting: Cardiology

## 2017-05-21 ENCOUNTER — Other Ambulatory Visit: Payer: Self-pay | Admitting: Internal Medicine

## 2017-05-21 NOTE — Telephone Encounter (Signed)
Is this okay to refill? Has not been seen since 2017 and was told with last refill must keep appointment for further refills.

## 2017-05-21 NOTE — Telephone Encounter (Signed)
No, she was lost for follow-up.  No refills anymore.

## 2017-05-27 ENCOUNTER — Telehealth: Payer: Self-pay | Admitting: Internal Medicine

## 2017-05-27 MED ORDER — LEVOTHYROXINE SODIUM 137 MCG PO TABS: ORAL_TABLET | ORAL | 0 refills | Status: DC

## 2017-05-27 NOTE — Telephone Encounter (Signed)
Not seen since 01/16/16 please advise on below

## 2017-05-27 NOTE — Telephone Encounter (Signed)
Pt agreed to call back and make an appt for 3 months. Informed her that they fill up fast so I wouldn't wait longer then 2-3 weeks before calling back to make the appt.

## 2017-05-27 NOTE — Telephone Encounter (Signed)
Pt states she is unable to drive and does not have transportation right now to get to an appt with the DR.  She would like to know if there is anyway the Dr can fill her script for her,   Please advise.   [id number] - [place] ([place]), [place] - [address]   levothyroxine (SYNTHROID, LEVOTHROID) 137 MCG tablet

## 2017-05-27 NOTE — Telephone Encounter (Signed)
Okay to refill it for 3 months but she absolutely needs to make it here for an appointment within this timeframe.

## 2017-06-12 ENCOUNTER — Other Ambulatory Visit: Payer: Self-pay

## 2017-06-12 ENCOUNTER — Inpatient Hospital Stay (HOSPITAL_COMMUNITY)
Admission: EM | Admit: 2017-06-12 | Discharge: 2017-06-14 | DRG: 309 | Disposition: A | Discharge status: Home / Self Care | Payer: Medicare Other | Attending: Cardiovascular Disease | Admitting: Cardiovascular Disease

## 2017-06-12 ENCOUNTER — Encounter (HOSPITAL_COMMUNITY): Payer: Self-pay | Admitting: *Deleted

## 2017-06-12 ENCOUNTER — Emergency Department (HOSPITAL_COMMUNITY): Payer: Medicare Other

## 2017-06-12 DIAGNOSIS — G4700 Insomnia, unspecified: Secondary | ICD-10-CM | POA: Diagnosis present

## 2017-06-12 DIAGNOSIS — Z79899 Other long term (current) drug therapy: Secondary | ICD-10-CM

## 2017-06-12 DIAGNOSIS — G4733 Obstructive sleep apnea (adult) (pediatric): Secondary | ICD-10-CM | POA: Diagnosis not present

## 2017-06-12 DIAGNOSIS — I340 Nonrheumatic mitral (valve) insufficiency: Secondary | ICD-10-CM | POA: Diagnosis not present

## 2017-06-12 DIAGNOSIS — R0600 Dyspnea, unspecified: Secondary | ICD-10-CM | POA: Diagnosis not present

## 2017-06-12 DIAGNOSIS — E119 Type 2 diabetes mellitus without complications: Secondary | ICD-10-CM | POA: Diagnosis present

## 2017-06-12 DIAGNOSIS — J310 Chronic rhinitis: Secondary | ICD-10-CM | POA: Diagnosis present

## 2017-06-12 DIAGNOSIS — Z974 Presence of external hearing-aid: Secondary | ICD-10-CM | POA: Diagnosis not present

## 2017-06-12 DIAGNOSIS — R001 Bradycardia, unspecified: Principal | ICD-10-CM | POA: Diagnosis present

## 2017-06-12 DIAGNOSIS — I10 Essential (primary) hypertension: Secondary | ICD-10-CM | POA: Diagnosis present

## 2017-06-12 DIAGNOSIS — K219 Gastro-esophageal reflux disease without esophagitis: Secondary | ICD-10-CM | POA: Diagnosis present

## 2017-06-12 DIAGNOSIS — Z79890 Hormone replacement therapy (postmenopausal): Secondary | ICD-10-CM

## 2017-06-12 DIAGNOSIS — I110 Hypertensive heart disease with heart failure: Secondary | ICD-10-CM | POA: Diagnosis not present

## 2017-06-12 DIAGNOSIS — Z9049 Acquired absence of other specified parts of digestive tract: Secondary | ICD-10-CM | POA: Diagnosis not present

## 2017-06-12 DIAGNOSIS — F329 Major depressive disorder, single episode, unspecified: Secondary | ICD-10-CM | POA: Diagnosis not present

## 2017-06-12 DIAGNOSIS — Z90710 Acquired absence of both cervix and uterus: Secondary | ICD-10-CM | POA: Diagnosis not present

## 2017-06-12 DIAGNOSIS — H9190 Unspecified hearing loss, unspecified ear: Secondary | ICD-10-CM | POA: Diagnosis present

## 2017-06-12 DIAGNOSIS — Z7982 Long term (current) use of aspirin: Secondary | ICD-10-CM

## 2017-06-12 DIAGNOSIS — H1851 Endothelial corneal dystrophy: Secondary | ICD-10-CM | POA: Diagnosis present

## 2017-06-12 DIAGNOSIS — Z79891 Long term (current) use of opiate analgesic: Secondary | ICD-10-CM | POA: Diagnosis not present

## 2017-06-12 DIAGNOSIS — E890 Postprocedural hypothyroidism: Secondary | ICD-10-CM | POA: Diagnosis present

## 2017-06-12 DIAGNOSIS — Z23 Encounter for immunization: Secondary | ICD-10-CM

## 2017-06-12 DIAGNOSIS — R0603 Acute respiratory distress: Secondary | ICD-10-CM | POA: Diagnosis not present

## 2017-06-12 DIAGNOSIS — E7800 Pure hypercholesterolemia, unspecified: Secondary | ICD-10-CM | POA: Diagnosis present

## 2017-06-12 DIAGNOSIS — E039 Hypothyroidism, unspecified: Secondary | ICD-10-CM

## 2017-06-12 DIAGNOSIS — Z885 Allergy status to narcotic agent status: Secondary | ICD-10-CM

## 2017-06-12 DIAGNOSIS — F419 Anxiety disorder, unspecified: Secondary | ICD-10-CM | POA: Diagnosis present

## 2017-06-12 DIAGNOSIS — T465X5A Adverse effect of other antihypertensive drugs, initial encounter: Secondary | ICD-10-CM | POA: Diagnosis present

## 2017-06-12 DIAGNOSIS — R55 Syncope and collapse: Secondary | ICD-10-CM | POA: Diagnosis not present

## 2017-06-12 DIAGNOSIS — R072 Precordial pain: Secondary | ICD-10-CM

## 2017-06-12 DIAGNOSIS — Z888 Allergy status to other drugs, medicaments and biological substances status: Secondary | ICD-10-CM

## 2017-06-12 DIAGNOSIS — I5030 Unspecified diastolic (congestive) heart failure: Secondary | ICD-10-CM | POA: Diagnosis present

## 2017-06-12 DIAGNOSIS — I5032 Chronic diastolic (congestive) heart failure: Secondary | ICD-10-CM | POA: Diagnosis not present

## 2017-06-12 DIAGNOSIS — I119 Hypertensive heart disease without heart failure: Secondary | ICD-10-CM | POA: Diagnosis present

## 2017-06-12 HISTORY — DX: Bradycardia, unspecified: R00.1

## 2017-06-12 LAB — BASIC METABOLIC PANEL
Anion gap: 12 (ref 5–15)
BUN: 23 mg/dL — AB (ref 6–20)
CALCIUM: 10.3 mg/dL (ref 8.9–10.3)
CO2: 23 mmol/L (ref 22–32)
Chloride: 104 mmol/L (ref 101–111)
Creatinine, Ser: 1.11 mg/dL — ABNORMAL HIGH (ref 0.44–1.00)
GFR calc Af Amer: 54 mL/min — ABNORMAL LOW (ref >60)
GFR, EST NON AFRICAN AMERICAN: 47 mL/min — AB (ref >60)
GLUCOSE: 103 mg/dL — AB (ref 65–99)
POTASSIUM: 3.9 mmol/L (ref 3.5–5.1)
Sodium: 139 mmol/L (ref 135–145)

## 2017-06-12 LAB — CBC
HEMATOCRIT: 38.4 % (ref 36.0–46.0)
Hemoglobin: 12.6 g/dL (ref 12.0–15.0)
MCH: 28.7 pg (ref 26.0–34.0)
MCHC: 32.8 g/dL (ref 30.0–36.0)
MCV: 87.5 fL (ref 78.0–100.0)
Platelets: 207 10*3/uL (ref 150–400)
RBC: 4.39 MIL/uL (ref 3.87–5.11)
RDW: 15.3 % (ref 11.5–15.5)
WBC: 7.5 10*3/uL (ref 4.0–10.5)

## 2017-06-12 LAB — I-STAT TROPONIN, ED: Troponin i, poc: 0 ng/mL (ref 0.00–0.08)

## 2017-06-12 LAB — TROPONIN I: Troponin I: <0.03 ng/mL (ref <0.03)

## 2017-06-12 LAB — MAGNESIUM: Magnesium: 2.1 mg/dL (ref 1.7–2.4)

## 2017-06-12 LAB — TSH: TSH: 42.727 u[IU]/mL — ABNORMAL HIGH (ref 0.350–4.500)

## 2017-06-12 MED ORDER — CLONAZEPAM 1 MG PO TABS
1.0000 mg | ORAL_TABLET | Freq: Every day | ORAL | Status: DC
  Administered 2017-06-13: 1 mg via ORAL
  Filled 2017-06-12: qty 1

## 2017-06-12 MED ORDER — PNEUMOCOCCAL VAC POLYVALENT 25 MCG/0.5ML IJ INJ
0.5000 mL | INJECTION | INTRAMUSCULAR | Status: AC
  Administered 2017-06-13: 0.5 mL via INTRAMUSCULAR
  Filled 2017-06-12: qty 0.5

## 2017-06-12 MED ORDER — HEPARIN SODIUM (PORCINE) 5000 UNIT/ML IJ SOLN
5000.0000 [IU] | Freq: Three times a day (TID) | INTRAMUSCULAR | Status: DC
  Administered 2017-06-12 – 2017-06-14 (×4): 5000 [IU] via SUBCUTANEOUS
  Filled 2017-06-12 (×4): qty 1

## 2017-06-12 MED ORDER — ASPIRIN 81 MG PO CHEW
81.0000 mg | CHEWABLE_TABLET | Freq: Every day | ORAL | Status: DC
  Administered 2017-06-13 – 2017-06-14 (×2): 81 mg via ORAL
  Filled 2017-06-12 (×2): qty 1

## 2017-06-12 MED ORDER — FENOFIBRATE 160 MG PO TABS
160.0000 mg | ORAL_TABLET | Freq: Every day | ORAL | Status: DC
  Administered 2017-06-13 – 2017-06-14 (×2): 160 mg via ORAL
  Filled 2017-06-12 (×2): qty 1

## 2017-06-12 MED ORDER — LISINOPRIL 40 MG PO TABS
40.0000 mg | ORAL_TABLET | Freq: Every day | ORAL | Status: DC
Start: 2017-06-13 — End: 2017-06-14
  Administered 2017-06-13 – 2017-06-14 (×2): 40 mg via ORAL
  Filled 2017-06-12 (×2): qty 1

## 2017-06-12 MED ORDER — FUROSEMIDE 20 MG PO TABS
20.0000 mg | ORAL_TABLET | Freq: Every day | ORAL | Status: DC
  Administered 2017-06-13 – 2017-06-14 (×2): 20 mg via ORAL
  Filled 2017-06-12 (×2): qty 1

## 2017-06-12 MED ORDER — NITROGLYCERIN 0.4 MG SL SUBL
SUBLINGUAL_TABLET | SUBLINGUAL | Status: AC
  Filled 2017-06-12: qty 1

## 2017-06-12 MED ORDER — SODIUM CHLORIDE 0.9 % IV BOLUS (SEPSIS)
500.0000 mL | Freq: Once | INTRAVENOUS | Status: AC
  Administered 2017-06-12: 500 mL via INTRAVENOUS

## 2017-06-12 MED ORDER — INFLUENZA VAC SPLIT HIGH-DOSE 0.5 ML IM SUSY
0.5000 mL | PREFILLED_SYRINGE | INTRAMUSCULAR | Status: AC
  Administered 2017-06-13: 0.5 mL via INTRAMUSCULAR
  Filled 2017-06-12: qty 0.5

## 2017-06-12 MED ORDER — NITROGLYCERIN 0.4 MG SL SUBL
0.4000 mg | SUBLINGUAL_TABLET | SUBLINGUAL | Status: DC | PRN
  Administered 2017-06-12 (×3): 0.4 mg via SUBLINGUAL

## 2017-06-12 MED ORDER — ZOLPIDEM TARTRATE 5 MG PO TABS
5.0000 mg | ORAL_TABLET | Freq: Every day | ORAL | Status: DC
  Administered 2017-06-12 – 2017-06-13 (×2): 5 mg via ORAL
  Filled 2017-06-12 (×2): qty 1

## 2017-06-12 MED ORDER — PAROXETINE HCL 20 MG PO TABS
60.0000 mg | ORAL_TABLET | Freq: Every day | ORAL | Status: DC
  Administered 2017-06-13 – 2017-06-14 (×2): 60 mg via ORAL
  Filled 2017-06-12 (×2): qty 3

## 2017-06-12 MED ORDER — AMLODIPINE BESYLATE 5 MG PO TABS
5.0000 mg | ORAL_TABLET | Freq: Every day | ORAL | Status: DC
  Administered 2017-06-13 – 2017-06-14 (×2): 5 mg via ORAL
  Filled 2017-06-12 (×2): qty 1

## 2017-06-12 MED ORDER — LEVOTHYROXINE SODIUM 25 MCG PO TABS
137.0000 ug | ORAL_TABLET | Freq: Every day | ORAL | Status: DC
  Administered 2017-06-13 – 2017-06-14 (×2): 137 ug via ORAL
  Filled 2017-06-12 (×2): qty 1

## 2017-06-12 MED ORDER — ATORVASTATIN CALCIUM 80 MG PO TABS
80.0000 mg | ORAL_TABLET | Freq: Every day | ORAL | Status: DC
  Administered 2017-06-13: 80 mg via ORAL
  Filled 2017-06-12: qty 1

## 2017-06-12 MED ORDER — ATROPINE SULFATE 1 MG/10ML IJ SOSY
0.5000 mg | PREFILLED_SYRINGE | Freq: Once | INTRAMUSCULAR | Status: AC
  Administered 2017-06-12: 0.5 mg via INTRAVENOUS
  Filled 2017-06-12: qty 10

## 2017-06-12 MED ORDER — TRAMADOL HCL 50 MG PO TABS: 50.0000 mg | ORAL_TABLET | Freq: Four times a day (QID) | ORAL | Status: DC | PRN

## 2017-06-12 NOTE — ED Notes (Signed)
Pt ambulated to bathroom with steady gait. 

## 2017-06-12 NOTE — Progress Notes (Signed)
Patient received from ED to room 4.  Pt is awake, alert and oriented x 4.  Pt denies pain or sob at this time.  Heart rate in the 30's which is consistent with report from ED.  EKG shows junctional bradycardia.  #22 g Angiocath in the right anterior forearm, saline lock.  BBS clear through out, heart sounds normal.  Abdomen soft with positive bs x 4.  Oriented to room, diet tray ordered.

## 2017-06-12 NOTE — Progress Notes (Signed)
[facility] for clarification of perimeters to call for HR in the 30's.  Received revised order to call for HR less than 35 and symptomatic.

## 2017-06-12 NOTE — ED Triage Notes (Signed)
Pt c/o SOB with Chest heaviness onset x 3 days, pt worsening pain, pt tearful in triage, c/o nausea, denies v/d,A&O x4

## 2017-06-12 NOTE — H&P (Signed)
Cardiology Admission History and Physical:   Patient ID: Kimberly [name]; MRN: [id number]; DOB: 1940/07/04   Admission date: 06/12/2017  Primary Care Provider: Clayborn Heron, MD Primary Cardiologist: Armanda Magic, MD  Primary Electrophysiologist:  N/A  Chief Complaint:  Chest Pain and Dyspnea (bradycadic in ED)  Patient Profile:   Kimberly [name] is a 77 y.o. female with a history of normal cath in 2010, followed by low risk NST in 2016, chronic diastolic HF, HTN, chronic LEE, hypothyroidism and OSA, presenting to the ED with complaint of chest pain and dyspnea and found to be bradycardic with HR in the 40s.   History of Present Illness:   Kimberly [name] is followed by [name] given h/o chronic diastolic HF and HTN. Pulmonology follows Kimberly OSA. She is on synthroid for hypothyroidism. She has undergone ischemic w/u in the past. She had a cath in 2010 due to CP showing normal coronary arteries with elevated LVEDP felt to be due to diastolic CHF vs. Microvascular angina. Kimberly CP resolved for a while but reoccurred and nuclear stress test 09/2014 was normal.She also had an echo in 2016 that showed normal LVEF at 55-60%, normal wall motion and G2DD. Trivial MR at the time.   She was seen by Dr  Mayford Knife in clinic 09/2016 and low dose BB was added to decrease HR for increased diastolic filling time in the setting of chronic diastolic HF. She was placed on Toprol XL, 25 mg daily. She had f/u again in 01/2017 and BP was elevated and clonidine added to regimen (was maxed out on lisinopril and resistant to higher doses of amlodipine due to LEE, also taking Lasix 20 mg and metoprolol 25 mg). Kimberly [name] was placed on clonidine, 0.1 mg BID. Kimberly [name] has not followed up since that time.   She now presents to the ED with complaint of CP and dyspnea. Symptom onset 3 days ago. Occurring off and on. On arrival to ED, EKG showed junctional bradycardia with rate at 43 bpm. BMP shows normal K at 3.9. SCr is 1.11, c/w baseline. CBC  WNL. POC troponin is negative. TSH pending. CXR unremarkable.   Kimberly [name] is currently resting comfortably in the ED. Rate still low in the upper 30s and junctional. BP is stable in the 120s systolic. Kimberly [name] notes she developed left sided chest pressure with some radiation to the anterior neck 3 days ago. Intermittent and associated with dyspnea and near syncope. She denies frank syncope. Kimberly [name] reports full med compliance and states she has been taking meds correctly. Kimberly [name] question about possible inadvertent overdose on meds but she denies this. Kimberly daughter questions this ? Some underlying memory issues.      Past Medical History:  Diagnosis Date  . Acid reflux   . Anxiety   . Arthritis   . Back spasm   . Chronic diastolic heart failure (HCC) 03/20/2015  . Depression   . Diabetes mellitus without complication (HCC)    diet controled  . Dietary noncompliance   . Edema extremities   . Frequency of urination   . Fuch's endothelial dystrophy    bilateral eyes  . Headache    occ  . HOH (hard of hearing)    wears hearing aid in left ear  . Hypercholesteremia   . Hypertensive heart disease with CHF (HCC) 09/11/2014  . Insomnia   . Normal coronary arteries 2010   cath with no obstructive epicardial disease.  Done for CP which was felt to be due to microvascular  angina or diastolic HF  . OSA on CPAP    Followed by Dr. Fannie Knee    Past Surgical History:  Procedure Laterality Date  . ABDOMINAL HYSTERECTOMY    . APPENDECTOMY    . CARDIAC CATHETERIZATION     no CAD  . CESAREAN SECTION     x 2  . CHOLECYSTECTOMY    . COLONOSCOPY    . EYE SURGERY Right    implant  . ROTATOR CUFF REPAIR Left   . SHOULDER ARTHROSCOPY WITH SUBACROMIAL DECOMPRESSION AND BICEP TENDON REPAIR Left 08/24/2012   Procedure: SHOULDER ARTHROSCOPY WITH SUBACROMIAL DECOMPRESSION AND BICEP TENDON REPAIR;  Surgeon: Cammy Copa, MD;  Location: [facility] OR;  Service: Orthopedics;  Laterality: Left;  Left Shoulder Arthroscopy,  Debridement, Biceps Tenotomy  . THYROIDECTOMY N/A 06/12/2015   Procedure: TOTAL THYROIDECTOMY;  Surgeon: Darnell Level, MD;  Location: [facility] OR;  Service: General;  Laterality: N/A;     Medications Prior to Admission: Prior to Admission medications   Medication Sig Start Date End Date Taking? Authorizing Provider  amLODipine (NORVASC) 5 MG tablet Take 1 tablet (5 mg total) by mouth daily. 03/03/17   Quintella Reichert, MD  aspirin 81 MG chewable tablet Chew 81 mg by mouth daily.    [provider]  atorvastatin (LIPITOR) 80 MG tablet Take 1 tablet by mouth daily. 04/09/15   [provider]  clonazePAM (KLONOPIN) 1 MG tablet Take 1 mg by mouth at bedtime. Patient reports that she takes as needed 03/20/15   [provider]  cloNIDine (CATAPRES) 0.1 MG tablet Take 1 tablet (0.1 mg total) by mouth 2 (two) times daily. 01/13/17 01/08/18  Quintella Reichert, MD  fenofibrate micronized (LOFIBRA) 200 MG capsule Take 200 mg by mouth daily before breakfast.    [provider]  furosemide (LASIX) 20 MG tablet TAKE 1 TABLET BY MOUTH ONCE DAILY 05/08/17   Quintella Reichert, MD  levothyroxine (SYNTHROID, LEVOTHROID) 137 MCG tablet TAKE 1 TABLET BY MOUTH BEFORE BREAKFAST (NEED  APPOINTMENT  FOR  FURTHER  REFILLS) 05/27/17   Carlus Pavlov, MD  Lido-Capsaicin-Men-Methyl Sal (SOOTHEE EX) Apply 1 drop topically daily. Eye drops    [provider]  lisinopril (PRINIVIL,ZESTRIL) 40 MG tablet Take 1 tablet (40 mg total) by mouth daily. 10/09/15   Quintella Reichert, MD  metoprolol succinate (TOPROL-XL) 25 MG 24 hr tablet TAKE 1 TABLET BY MOUTH ONCE DAILY 01/16/17   Quintella Reichert, MD  nitroGLYCERIN (NITROSTAT) 0.4 MG SL tablet Place 0.4 mg under the tongue every 5 (five) minutes as needed for chest pain.     [provider]  PARoxetine (PAXIL) 20 MG tablet Take 60 mg by mouth every morning.    [provider]  traMADol (ULTRAM) 50 MG tablet Take 1-2 tablets (50-100 mg total)  by mouth every 6 (six) hours as needed for moderate pain or severe pain. 06/13/15   Darnell Level, MD  zolpidem (AMBIEN) 10 MG tablet Take 5 mg by mouth at bedtime. For sleep    [provider]     Allergies:    Allergies  Allergen Reactions  . Percocet [Oxycodone-Acetaminophen] Shortness Of Breath and Other (See Comments)    Kimberly [name] couldn't breathe or catch Kimberly breath  . Codeine Other (See Comments)    hyperactivity  . Other Other (See Comments)    Some pain medication given at [facility] for surgery caused hallucinations   . Phenergan [Promethazine Hcl] Other (See Comments)  Hallucinations     Social History:   Social History   Socioeconomic History  . Marital status: Married    Spouse name: Not on file  . Number of children: 2  . Years of education: Not on file  . Highest education level: Not on file  Social Needs  . Financial resource strain: Not on file  . Food insecurity - worry: Not on file  . Food insecurity - inability: Not on file  . Transportation needs - medical: Not on file  . Transportation needs - non-medical: Not on file  Occupational History  . Occupation: Radiographer, therapeuticretired-assistant teacher  Tobacco Use  . Smoking status: Never Smoker  . Smokeless tobacco: Never Used  Substance and Sexual Activity  . Alcohol use: No    Alcohol/week: 0.0 oz  . Drug use: No  . Sexual activity: Not on file  Other Topics Concern  . Not on file  Social History Narrative  . Not on file    Family History:  The patient's family history includes Cancer in Kimberly sister; Stomach cancer in Kimberly [name].    ROS:  Please see the history of present illness.  All other ROS reviewed and negative.     Physical Exam/Data:   Vitals:   06/12/17 1331 06/12/17 1334  BP: (!) 127/57   Pulse: (!) 42   Resp: 16   Temp: 98.4 F (36.9 C)   TempSrc: Oral   SpO2: 100%   Weight:  179 lb (81.2 kg)  Height:  5' (1.524 m)   No intake or output data in the 24 hours ending 06/12/17  1534 Filed Weights   06/12/17 1334  Weight: 179 lb (81.2 kg)   Body mass index is 34.96 kg/m.  General:  Well nourished, well developed, in no acute distress, elderly  HEENT: normal Lymph: no adenopathy Neck: no JVD Endocrine:  No thryomegaly Vascular: No carotid bruits; FA pulses 2+ bilaterally without bruits  Cardiac:  Regular rhythm but slow rate Lungs:  clear to auscultation bilaterally, no wheezing, rhonchi or rales  Abd: soft, nontender, no hepatomegaly  Ext: chronic bilateral LEE edema Musculoskeletal:  No deformities, BUE and BLE strength normal and equal Skin: warm and dry  Neuro:  CNs 2-12 intact, no focal abnormalities noted Psych:  Normal affect    EKG:  The ECG that was done 06/12/17 was personally reviewed and demonstrates junctional bradycardia, HR 43 bpm  Relevant CV Studies: 2D Echo 2016 Study Conclusions  - Left ventricle: The cavity size was normal. There was mild concentric hypertrophy. Systolic function was normal. The estimated ejection fraction was in the range of 55% to 60%. Wall motion was normal; there were no regional wall motion abnormalities. Features are consistent with a pseudonormal left ventricular filling pattern, with concomitant abnormal relaxation and increased filling pressure (grade 2 diastolic dysfunction). - Left atrium: The atrium was mildly dilated. - Pericardium, extracardiac: A trivial pericardial effusion was identified.  LHC 2010 by Dr. Katrinka BlazingSmith. Normal coronaries. See scanned report under procedures.    Laboratory Data:  Chemistry Recent Labs  Lab 06/12/17 1331  NA 139  K 3.9  CL 104  CO2 23  GLUCOSE 103*  BUN 23*  CREATININE 1.11*  CALCIUM 10.3  GFRNONAA 47*  GFRAA 54*  ANIONGAP 12    No results for input(s): PROT, ALBUMIN, AST, ALT, ALKPHOS, BILITOT in the last 168 hours. Hematology Recent Labs  Lab 06/12/17 1331  WBC 7.5  RBC 4.39  HGB 12.6  HCT 38.4  MCV  87.5  MCH 28.7  MCHC 32.8  RDW  15.3  PLT 207   Cardiac EnzymesNo results for input(s): TROPONINI in the last 168 hours.  Recent Labs  Lab 06/12/17 1357  TROPIPOC 0.00    BNPNo results for input(s): BNP, PROBNP in the last 168 hours.  DDimer No results for input(s): DDIMER in the last 168 hours.  Radiology/Studies:  Dg Chest 2 View  Result Date: 06/12/2017 CLINICAL DATA:  Short of breath with chest heaviness beginning 3 days ago. EXAM: CHEST  2 VIEW COMPARISON:  01/23/2016 FINDINGS: Cardiac silhouette is mildly enlarged. No mediastinal or hilar masses. No evidence of adenopathy. Lungs are mildly hyperexpanded but clear. No pleural effusion or pneumothorax. Skeletal structures are demineralized. There stable changes from previous left rotator cuff surgery. IMPRESSION: 1. No acute cardiopulmonary disease. 2. Stable cardiomegaly. Electronically Signed   By: Amie Portland M.D.   On: 06/12/2017 14:34    Assessment and Plan:   1. Symptomatic Junctional Bradycardia: HR in the upper 30s but BP is stable. Kimberly [name] mentating well. K, BMP and CBC WNL. Kimberly [name] has h/o hypothyroidism and is on levothyroxine. TSH pending. POC troponin is negative. We will plan to admit to telemetry. Will discontinue clonidine and hold metoprolol for now. Monitor HR response on tele. Given normal coronaries on cath in 2010 and low risk NST in 2016, suspect Kimberly symptoms are primarily related to Kimberly bradycardia and not due to underlying coronary ischemia. We will monitor Kimberly symptoms and reassess as HR improves. We will also need to avoid nuclear stress testing at this time given degree of bradycardia. If HR does not improve after BB and clonidine wahsout and if no reversible etiologies identified, then Kimberly [name] will need to be evaluated by EP for PPM.    2. HTN: currently well controlled. We will need to monitor closely given discontinuation of clonidine and metoprolol. Can continue lisinopril and amlodipine tomorrow. Adjust regimen as needed.    3. Chronic Diastolic HF:  last echo was in 2016. Given bradycardia, will repeat echo to ensure no interval change in LVF and wall motion. Volume appears stable. Monitor closely.   4. OSA: Kimberly [name] reports full nightly compliance. Kimberly [name] plans to have family bring in home device for inpatient use.   5. Hypothyroidism: on levothyroxine. Checking TSH given bradycardia.    Severity of Illness: The appropriate patient status for this patient is INPATIENT. Inpatient status is judged to be reasonable and necessary in order to provide the required intensity of service to ensure the patient's safety. The patient's presenting symptoms, physical exam findings, and initial radiographic and laboratory data in the context of their chronic comorbidities is felt to place them at high risk for further clinical deterioration. Furthermore, it is not anticipated that the patient will be medically stable for discharge from the hospital within 2 midnights of admission. The following factors support the patient status of inpatient.   " The patient's presenting symptoms include chest pain, dyspnea and near syncope. " The worrisome physical exam findings include bradycardia . " The initial radiographic and laboratory data are worrisome because of abnormal EKG, junctional bradycardia. " The chronic co-morbidities include chronic diastolic HF and HTN.   * I certify that at the point of admission it is my clinical judgment that the patient will require inpatient hospital care spanning beyond 2 midnights from the point of admission due to high intensity of service, high risk for further deterioration and high frequency of surveillance required.*    For questions  or updates, please contact [facility] Please consult [email or web address] for contact info under Cardiology/STEMI.    Signed, Robbie Lis, PA-C  06/12/2017 3:34 PM   I have seen and examined the patient along with Robbie Lis, PA-C.  I have reviewed the chart, notes and new data.  I agree  with PA's note.  Key new complaints: Major complaints are of fatigue and chest pressure.  Symptom onset was gradual.  Also has complaints of daytime sleepiness, but insomnia at night, very dry mouth, occasional disorientation and dizziness. Key examination changes: Bradycardia, otherwise normal cardiovascular exam.  Possibly some degree of myxedematous facial changes Key new findings / data: ECG shows junctional escape rhythm, otherwise unremarkable.  No ischemic changes.  Cardiac enzymes normal so far.  PLAN: Kimberly bradycardia is likely largely attributable to treatment with beta-blockers and clonidine, although one wonders why she tolerated these medicines well for a [name] time until they caused such bradycardia.  Daughter raises concern about the way Kimberly [name] takes Kimberly medications. Check for hypothyroidism. It is also possible there is a component of age-related sinus node dysfunction.  Would not recommend pacemaker therapy unless the bradycardia persists 72 hours after we had discontinued these medications. Hold metoprolol and clonidine and monitor on telemetry. I would not restart Kimberly clonidine for hypertension since it is also causing other side effects. Doubt that this is due to coronary insufficiency, will only reinvestigate for CAD if chest pressure persists once bradycardia resolves.  Thurmon Fair, MD, [facility] [phone] 06/12/2017, 4:46 PM

## 2017-06-12 NOTE — Progress Notes (Addendum)
Around 2000, pt called out reporting 8/10 chest pressure similar to previous CP. VSS. HR in low 40s. EKG obtained. Pain lowered to 1/10 after x3 NTG tablets. Pt currently sitting up eating dinner. Will continue to closely monitor.  Margarito LinerStephanie M Connor Meacham, RN   Edit: pt reassessed at 2100, reported complete resolution of CP. Advised pt to notify RN if symptoms returned.

## 2017-06-12 NOTE — ED Provider Notes (Signed)
[facility] 4E CV SURGICAL PROGRESSIVE CARE Provider Note   CSN: [id number] Arrival date & time: 06/12/17  1319     History   Chief Complaint Chief Complaint  Patient presents with  . Shortness of Breath  . Chest Pain    HPI Kimberly [name] is a 77 y.o. female.  Patient with diastolic heart failure,diabetes presents with fatigue lightheadedness and low heart rate. Patient is on metoprolol no recent change in medications. Patient said worsening symptoms for the past 5 days feeling she could pass out when she stands up.      Past Medical History:  Diagnosis Date  . Acid reflux   . Anxiety   . Arthritis   . Back spasm   . Chronic diastolic heart failure (HCC) 03/20/2015  . Depression   . Diabetes mellitus without complication (HCC)    diet controled  . Dietary noncompliance   . Edema extremities   . Frequency of urination   . Fuch's endothelial dystrophy    bilateral eyes  . Headache    occ  . HOH (hard of hearing)    wears hearing aid in left ear  . Hypercholesteremia   . Hypertensive heart disease with CHF (HCC) 09/11/2014  . Insomnia   . Normal coronary arteries 2010   cath with no obstructive epicardial disease.  Done for CP which was felt to be due to microvascular angina or diastolic HF  . OSA on CPAP    Followed by Dr. Fannie Knee    Patient Active Problem List   Diagnosis Date Noted  . Junctional bradycardia 06/12/2017  . Essential hypertension 10/30/2015  . Edema extremities 09/25/2015  . Dietary noncompliance 09/25/2015  . S/P thyroidectomy 09/18/2015  . Postsurgical hypothyroidism 09/18/2015  . Vitamin D insufficiency 09/18/2015  . Chronic diastolic heart failure (HCC) 03/20/2015  . Elevated troponin 09/11/2014  . Hypertensive heart disease 09/11/2014  . Rhinitis sicca 07/02/2014  . Hypercalcemia 12/09/2013  . Obstructive sleep apnea 08/29/2013  . Acid reflux     Past Surgical History:  Procedure Laterality Date  . ABDOMINAL HYSTERECTOMY    .  APPENDECTOMY    . CARDIAC CATHETERIZATION     no CAD  . CESAREAN SECTION     x 2  . CHOLECYSTECTOMY    . COLONOSCOPY    . EYE SURGERY Right    implant  . ROTATOR CUFF REPAIR Left   . SHOULDER ARTHROSCOPY WITH SUBACROMIAL DECOMPRESSION AND BICEP TENDON REPAIR Left 08/24/2012   Procedure: SHOULDER ARTHROSCOPY WITH SUBACROMIAL DECOMPRESSION AND BICEP TENDON REPAIR;  Surgeon: Cammy Copa, MD;  Location: [facility] OR;  Service: Orthopedics;  Laterality: Left;  Left Shoulder Arthroscopy, Debridement, Biceps Tenotomy  . THYROIDECTOMY N/A 06/12/2015   Procedure: TOTAL THYROIDECTOMY;  Surgeon: Darnell Level, MD;  Location: [facility] OR;  Service: General;  Laterality: N/A;    OB History    No data available       Home Medications    Prior to Admission medications   Medication Sig Start Date End Date Taking? Authorizing Provider  amLODipine (NORVASC) 5 MG tablet Take 1 tablet (5 mg total) by mouth daily. 03/03/17   Quintella Reichert, MD  aspirin 81 MG chewable tablet Chew 81 mg by mouth daily.    [provider]  atorvastatin (LIPITOR) 80 MG tablet Take 1 tablet by mouth daily. 04/09/15   [provider]  clonazePAM (KLONOPIN) 1 MG tablet Take 1 mg by mouth at bedtime. Patient reports that she takes as needed  03/20/15   [provider]  cloNIDine (CATAPRES) 0.1 MG tablet Take 1 tablet (0.1 mg total) by mouth 2 (two) times daily. 01/13/17 01/08/18  Quintella Reicherturner, Traci R, MD  fenofibrate micronized (LOFIBRA) 200 MG capsule Take 200 mg by mouth daily before breakfast.    [provider]  furosemide (LASIX) 20 MG tablet TAKE 1 TABLET BY MOUTH ONCE DAILY 05/08/17   Quintella Reicherturner, Traci R, MD  levothyroxine (SYNTHROID, LEVOTHROID) 137 MCG tablet TAKE 1 TABLET BY MOUTH BEFORE BREAKFAST (NEED  APPOINTMENT  FOR  FURTHER  REFILLS) 05/27/17   Carlus PavlovGherghe, Cristina, MD  Lido-Capsaicin-Men-Methyl Sal (SOOTHEE EX) Apply 1 drop topically daily. Eye drops    [provider]  lisinopril  (PRINIVIL,ZESTRIL) 40 MG tablet Take 1 tablet (40 mg total) by mouth daily. 10/09/15   Quintella Reicherturner, Traci R, MD  metoprolol succinate (TOPROL-XL) 25 MG 24 hr tablet TAKE 1 TABLET BY MOUTH ONCE DAILY 01/16/17   Quintella Reicherturner, Traci R, MD  nitroGLYCERIN (NITROSTAT) 0.4 MG SL tablet Place 0.4 mg under the tongue every 5 (five) minutes as needed for chest pain.     [provider]  PARoxetine (PAXIL) 20 MG tablet Take 60 mg by mouth every morning.    [provider]  traMADol (ULTRAM) 50 MG tablet Take 1-2 tablets (50-100 mg total) by mouth every 6 (six) hours as needed for moderate pain or severe pain. 06/13/15   Darnell LevelGerkin, Todd, MD  zolpidem (AMBIEN) 10 MG tablet Take 5 mg by mouth at bedtime. For sleep    [provider]    Family History Family History  Problem Relation Age of Onset  . Stomach cancer Mother   . Cancer Sister        intestinal    Social History Social History   Tobacco Use  . Smoking status: Never Smoker  . Smokeless tobacco: Never Used  Substance Use Topics  . Alcohol use: No    Alcohol/week: 0.0 oz  . Drug use: No     Allergies   Percocet [oxycodone-acetaminophen]; Codeine; Other; and Phenergan [promethazine hcl]   Review of Systems Review of Systems  Constitutional: Positive for fatigue. Negative for chills and fever.  HENT: Negative for congestion.   Eyes: Negative for visual disturbance.  Respiratory: Negative for shortness of breath.   Cardiovascular: Negative for chest pain.  Gastrointestinal: Negative for abdominal pain and vomiting.  Genitourinary: Negative for dysuria and flank pain.  Musculoskeletal: Negative for back pain, neck pain and neck stiffness.  Skin: Negative for rash.  Neurological: Positive for light-headedness. Negative for headaches.     Physical Exam Updated Vital Signs BP (!) 143/69   Pulse (!) 41   Temp 98.5 F (36.9 C) (Oral)   Resp 13   Ht 5' (1.524 m)   Wt 81.2 kg (179 lb)   SpO2 94%   BMI 34.96 kg/m    Physical Exam  Constitutional: She is oriented to person, place, and time. She appears well-developed and well-nourished.  HENT:  Head: Normocephalic and atraumatic.  Eyes: Conjunctivae are normal. Right eye exhibits no discharge. Left eye exhibits no discharge.  Neck: Normal range of motion. Neck supple. No tracheal deviation present.  Cardiovascular: Regular rhythm. Bradycardia present.  Pulmonary/Chest: Effort normal and breath sounds normal.  Abdominal: Soft. She exhibits no distension. There is no tenderness. There is no guarding.  Musculoskeletal: She exhibits no edema.  Neurological: She is alert and oriented to person, place, and time.  Skin: Skin is warm. No rash noted.  Psychiatric: She has a normal mood and affect.  Nursing note and vitals reviewed.    ED Treatments / Results  Labs (all labs ordered are listed, but only abnormal results are displayed) Labs Reviewed  BASIC METABOLIC PANEL - Abnormal; Notable for the following components:      Result Value   Glucose, Bld 103 (*)    BUN 23 (*)    Creatinine, Ser 1.11 (*)    GFR calc non Af Amer 47 (*)    GFR calc Af Amer 54 (*)    All other components within normal limits  TSH - Abnormal; Notable for the following components:   TSH 42.727 (*)    All other components within normal limits  CBC  MAGNESIUM  TROPONIN I  TROPONIN I  TROPONIN I  BASIC METABOLIC PANEL  I-STAT TROPONIN, ED    EKG  EKG Interpretation  Date/Time:  Friday June 12 2017 13:24:42 EST Ventricular Rate:  43 PR Interval:    QRS Duration: 90 QT Interval:  468 QTC Calculation: 395 R Axis:   9 Text Interpretation:  Junctional bradycardia Possible Anterior infarct , age undetermined Abnormal ECG Confirmed by Blane Ohara [phone]) on 06/12/2017 3:02:51 PM       Radiology Dg Chest 2 View  Result Date: 06/12/2017 CLINICAL DATA:  Short of breath with chest heaviness beginning 3 days ago. EXAM: CHEST  2 VIEW COMPARISON:  01/23/2016  FINDINGS: Cardiac silhouette is mildly enlarged. No mediastinal or hilar masses. No evidence of adenopathy. Lungs are mildly hyperexpanded but clear. No pleural effusion or pneumothorax. Skeletal structures are demineralized. There stable changes from previous left rotator cuff surgery. IMPRESSION: 1. No acute cardiopulmonary disease. 2. Stable cardiomegaly. Electronically Signed   By: Amie Portland M.D.   On: 06/12/2017 14:34    Procedures .Critical Care Performed by: Blane Ohara, MD Authorized by: Blane Ohara, MD   Critical care provider statement:    Critical care time (minutes):  35   Critical care start time:  06/12/2017 4:00 PM   Critical care end time:  06/12/2017 4:35 PM   Critical care was necessary to treat or prevent imminent or life-threatening deterioration of the following conditions:  Cardiac failure   Critical care was time spent personally by me on the following activities:  Examination of patient, ordering and review of laboratory studies, ordering and review of radiographic studies and re-evaluation of patient's condition   I assumed direction of critical care for this patient from another provider in my specialty: no     (including critical care time)  Medications Ordered in ED Medications  amLODipine (NORVASC) tablet 5 mg (not administered)  aspirin chewable tablet 81 mg (not administered)  atorvastatin (LIPITOR) tablet 80 mg (not administered)  clonazePAM (KLONOPIN) tablet 1 mg (not administered)  fenofibrate tablet 160 mg (not administered)  furosemide (LASIX) tablet 20 mg (not administered)  levothyroxine (SYNTHROID, LEVOTHROID) tablet 137 mcg (not administered)  lisinopril (PRINIVIL,ZESTRIL) tablet 40 mg (not administered)  nitroGLYCERIN (NITROSTAT) SL tablet 0.4 mg (0.4 mg Sublingual Given 06/12/17 2011)  PARoxetine (PAXIL) tablet 60 mg (not administered)  traMADol (ULTRAM) tablet 50-100 mg (not administered)  heparin injection 5,000 Units (5,000 Units  Subcutaneous Given 06/12/17 2206)  zolpidem (AMBIEN) tablet 5 mg (5 mg Oral Given 06/12/17 2207)  pneumococcal 23 valent vaccine (PNU-IMMUNE) injection 0.5 mL (not administered)  Influenza vac split quadrivalent PF (FLUZONE HIGH-DOSE) injection 0.5 mL (not administered)  atropine 1 MG/10ML injection 0.5 mg (0.5 mg Intravenous Given 06/12/17 1551)  sodium chloride 0.9 % bolus 500 mL (0 mLs Intravenous Stopped 06/12/17 1650)  nitroGLYCERIN (NITROSTAT) 0.4 MG SL tablet (0 mg  Duplicate 06/12/17 2004)     Initial Impression / Assessment and Plan / ED Course  I have reviewed the triage vital signs and the nursing notes.  Pertinent labs & imaging results that were available during my care of the patient were reviewed by me and considered in my medical decision making (see chart for details).    Patient presents for symptomatic bradycardia. EKG reviewed consistent with junctional bradycardia. Discussed concern for beta blocker that she is on in addition she may require pacemaker if she does not improve in the hospital setting. Atropine given with minimal improvement.Discussed with cardiology for admission.  The patients results and plan were reviewed and discussed.   Any x-rays performed were independently reviewed by myself.   Differential diagnosis were considered with the presenting HPI.  Medications  amLODipine (NORVASC) tablet 5 mg (not administered)  aspirin chewable tablet 81 mg (not administered)  atorvastatin (LIPITOR) tablet 80 mg (not administered)  clonazePAM (KLONOPIN) tablet 1 mg (not administered)  fenofibrate tablet 160 mg (not administered)  furosemide (LASIX) tablet 20 mg (not administered)  levothyroxine (SYNTHROID, LEVOTHROID) tablet 137 mcg (not administered)  lisinopril (PRINIVIL,ZESTRIL) tablet 40 mg (not administered)  nitroGLYCERIN (NITROSTAT) SL tablet 0.4 mg (0.4 mg Sublingual Given 06/12/17 2011)  PARoxetine (PAXIL) tablet 60 mg (not administered)  traMADol (ULTRAM) tablet  50-100 mg (not administered)  heparin injection 5,000 Units (5,000 Units Subcutaneous Given 06/12/17 2206)  zolpidem (AMBIEN) tablet 5 mg (5 mg Oral Given 06/12/17 2207)  pneumococcal 23 valent vaccine (PNU-IMMUNE) injection 0.5 mL (not administered)  Influenza vac split quadrivalent PF (FLUZONE HIGH-DOSE) injection 0.5 mL (not administered)  atropine 1 MG/10ML injection 0.5 mg (0.5 mg Intravenous Given 06/12/17 1551)  sodium chloride 0.9 % bolus 500 mL (0 mLs Intravenous Stopped 06/12/17 1650)  nitroGLYCERIN (NITROSTAT) 0.4 MG SL tablet (0 mg  Duplicate 06/12/17 2004)    Vitals:   06/12/17 1700 06/12/17 1814 06/12/17 1932 06/12/17 2022  BP:  (!) 161/54 (!) 163/60 (!) 143/69  Pulse: (!) 37 83 (!) 35 (!) 41  Resp: 13 (!) 22 14 13   Temp:  98.3 F (36.8 C) 98.5 F (36.9 C)   TempSrc:  Oral Oral   SpO2: 98% 95% 98% 94%  Weight:      Height:        Final diagnoses:  Near syncope  Junctional bradycardia    Admission/ observation were discussed with the admitting physician, patient and/or family and they are comfortable with the plan.    Final Clinical Impressions(s) / ED Diagnoses   Final diagnoses:  Near syncope  Junctional bradycardia    ED Discharge Orders    None       Blane Ohara, MD 06/12/17 2314

## 2017-06-12 NOTE — ED Notes (Signed)
Patient transported to X-ray 

## 2017-06-13 ENCOUNTER — Inpatient Hospital Stay (HOSPITAL_COMMUNITY): Payer: Medicare Other

## 2017-06-13 DIAGNOSIS — I340 Nonrheumatic mitral (valve) insufficiency: Secondary | ICD-10-CM

## 2017-06-13 DIAGNOSIS — R0600 Dyspnea, unspecified: Secondary | ICD-10-CM

## 2017-06-13 LAB — TROPONIN I
Troponin I: <0.03 ng/mL (ref <0.03)
Troponin I: <0.03 ng/mL (ref <0.03)

## 2017-06-13 LAB — BASIC METABOLIC PANEL
Anion gap: 12 (ref 5–15)
BUN: 22 mg/dL — AB (ref 6–20)
CALCIUM: 9.7 mg/dL (ref 8.9–10.3)
CHLORIDE: 103 mmol/L (ref 101–111)
CO2: 24 mmol/L (ref 22–32)
Creatinine, Ser: 1.19 mg/dL — ABNORMAL HIGH (ref 0.44–1.00)
GFR calc Af Amer: 50 mL/min — ABNORMAL LOW (ref >60)
GFR calc non Af Amer: 43 mL/min — ABNORMAL LOW (ref >60)
GLUCOSE: 99 mg/dL (ref 65–99)
Potassium: 3.8 mmol/L (ref 3.5–5.1)
Sodium: 139 mmol/L (ref 135–145)

## 2017-06-13 LAB — ECHOCARDIOGRAM COMPLETE
HEIGHTINCHES: 60 in
Weight: 2864 oz

## 2017-06-13 NOTE — Progress Notes (Signed)
Very late entry:   Patient became SOB this am around 9:42 am  Placed on O2 at 4 L/m  B/P  179/71  - 184/66, Pt crying, denies any pain,  EKG  Obtained,  Heart rhythm  Sinus bradycardia,   Lying in bed, offered cool drink,   Dr. Antoine PocheHochrein was present in room for part of the patient response to feeling weak.   Governor SpeckingKathryn Elick Aguilera, RN

## 2017-06-13 NOTE — Progress Notes (Signed)
Patient has taken 02 off now, says she feels  much better now, wants to go home, family here and wants to know  Her disposition,   Notified cardiology  Governor SpeckingKathryn Trecia Maring, RN

## 2017-06-13 NOTE — Progress Notes (Signed)
Progress Note  Patient Name: Kimberly [name] Date of Encounter: 06/13/2017  Primary Cardiologist:   Armanda Magic, MD   Subjective   The patient apparently had no complaints throughout the night.  However, when I came in to round on her this morning she was tearful and having complaints of her throat closing up.  Rhythm was junctional which is unchanged. Rate 40s.  BP is slightly hypertensive. No chest pain or distress.  No ischemia on EKG.  O2 sats are normal and now 100% on O2.  She wants her husband to come up.    Inpatient Medications    Scheduled Meds: . amLODipine  5 mg Oral Daily  . aspirin  81 mg Oral Daily  . atorvastatin  80 mg Oral q1800  . clonazePAM  1 mg Oral QHS  . fenofibrate  160 mg Oral Daily  . furosemide  20 mg Oral Daily  . heparin  5,000 Units Subcutaneous Q8H  . Influenza vac split quadrivalent PF  0.5 mL Intramuscular Tomorrow-1000  . levothyroxine  137 mcg Oral QAC breakfast  . lisinopril  40 mg Oral Daily  . PARoxetine  60 mg Oral Daily  . pneumococcal 23 valent vaccine  0.5 mL Intramuscular Tomorrow-1000  . zolpidem  5 mg Oral QHS   Continuous Infusions:  PRN Meds: nitroGLYCERIN, traMADol   Vital Signs    Vitals:   06/12/17 2022 06/13/17 0417 06/13/17 0442 06/13/17 0852  BP: (!) 143/69 (!) 194/52 (!) 147/63 (!) 153/74  Pulse: (!) 41 94    Resp: 13 (!) 25    Temp:  98.1 F (36.7 C)    TempSrc:  Oral    SpO2: 94% 94%    Weight:      Height:        Intake/Output Summary (Last 24 hours) at 06/13/2017 0946 Last data filed at 06/13/2017 0715 Gross per 24 hour  Intake -  Output 500 ml  Net -500 ml   Filed Weights   06/12/17 1334  Weight: 179 lb (81.2 kg)    Telemetry    Junctional bradycardia with episodes of sinus.   - Personally Reviewed  ECG    Sinus bradycardia or ectopic atrial bradycardia , rate 45, no acute ST T wave changes.  - Personally Reviewed  Physical Exam   GEN:    She is tearful but not in distress Neck: No   JVD Cardiac: RRR, no murmurs, rubs, or gallops.  Respiratory: Clear  to auscultation bilaterally. GI: Soft, nontender, non-distended  MS: No  edema; No deformity. Neuro:  Nonfocal  Psych:   Tearful.  Oriented.    Labs    Chemistry Recent Labs  Lab 06/12/17 1331 06/13/17 0615  NA 139 139  K 3.9 3.8  CL 104 103  CO2 23 24  GLUCOSE 103* 99  BUN 23* 22*  CREATININE 1.11* 1.19*  CALCIUM 10.3 9.7  GFRNONAA 47* 43*  GFRAA 54* 50*  ANIONGAP 12 12     Hematology Recent Labs  Lab 06/12/17 1331  WBC 7.5  RBC 4.39  HGB 12.6  HCT 38.4  MCV 87.5  MCH 28.7  MCHC 32.8  RDW 15.3  PLT 207    Cardiac Enzymes Recent Labs  Lab 06/12/17 1955 06/13/17 0042 06/13/17 0615  TROPONINI <0.03 <0.03 <0.03    Recent Labs  Lab 06/12/17 1357  TROPIPOC 0.00     BNPNo results for input(s): BNP, PROBNP in the last 168 hours.   DDimer No results for  input(s): DDIMER in the last 168 hours.   Radiology    Dg Chest 2 View  Result Date: 06/12/2017 CLINICAL DATA:  Short of breath with chest heaviness beginning 3 days ago. EXAM: CHEST  2 VIEW COMPARISON:  01/23/2016 FINDINGS: Cardiac silhouette is mildly enlarged. No mediastinal or hilar masses. No evidence of adenopathy. Lungs are mildly hyperexpanded but clear. No pleural effusion or pneumothorax. Skeletal structures are demineralized. There stable changes from previous left rotator cuff surgery. IMPRESSION: 1. No acute cardiopulmonary disease. 2. Stable cardiomegaly. Electronically Signed   By: Amie Portlandavid  Ormond M.D.   On: 06/12/2017 14:34    Cardiac Studies   Echo:  Pending  Patient Profile     77 y.o. female with a history of normal cath in 2010, followed by low risk NST in 2016, chronic diastolic HF, HTN, chronic LEE, hypothyroidism and OSA, presented to the ED with complaint of chest pain and dyspnea and found to be bradycardic with HR in the 40s.    Assessment & Plan    CHEST PAIN:  No evidence of active ischemia.  Troponin  negative.  No further ischemia work up.  See below.    Bradycardia:  Clonidine stopped and metoprolol held.  Her heart rate looks like it is starting to recover.  Holding meds as above.   HTN:  BP has been mildly elevated.  At this point I will not add meds to adjust this.    SLEEP APNEA:  Her family is to bring her home device  ACUTE DISTRESS:  She has acute complaints as above without any objective findings.  We will follow her with close monitoring.  I will cycle another set of enzymes but I don't suspect ischemia.    For questions or updates, please contact [facility] Please consult [email or web address] for contact info under Cardiology/STEMI.   Signed, Rollene RotundaJames Aynsley Fleet, MD  06/13/2017, 9:46 AM

## 2017-06-13 NOTE — Progress Notes (Signed)
  Echocardiogram 2D Echocardiogram has been performed.  Kimberly [name], Kimberly [name] 06/13/2017, 4:14 PM

## 2017-06-14 ENCOUNTER — Encounter (HOSPITAL_COMMUNITY): Payer: Self-pay | Admitting: Physician Assistant

## 2017-06-14 DIAGNOSIS — R001 Bradycardia, unspecified: Secondary | ICD-10-CM

## 2017-06-14 MED ORDER — AMLODIPINE BESYLATE 10 MG PO TABS: 10.0000 mg | ORAL_TABLET | Freq: Every day | ORAL | 3 refills | Status: DC

## 2017-06-14 MED ORDER — HYDRALAZINE HCL 10 MG PO TABS: 10.0000 mg | ORAL_TABLET | Freq: Four times a day (QID) | ORAL | 1 refills | Status: DC | PRN

## 2017-06-14 MED ORDER — HYDRALAZINE HCL 20 MG/ML IJ SOLN
10.0000 mg | Freq: Once | INTRAMUSCULAR | Status: AC
  Administered 2017-06-14: 10 mg via INTRAVENOUS
  Filled 2017-06-14: qty 1

## 2017-06-14 NOTE — Discharge Summary (Signed)
Discharge Summary    Patient ID: Kimberly [name],  MRN: [id number], DOB/AGE: [birth year] 77 y.o.  Admit date: 06/12/2017 Discharge date: 06/14/2017  Primary Care Provider: Beverley Fiedler R Primary Cardiologist: [name]  Discharge Diagnoses    Principal Problem:   Junctional bradycardia Active Problems:   Hypertensive heart disease   Chronic diastolic heart failure [facility])   Postsurgical hypothyroidism   Essential hypertension   Sinus bradycardia    Diagnostic Studies/Procedures    2D Echo 06/13/17 Study Conclusions  - Left ventricle: The cavity size was normal. Wall thickness was   increased in a pattern of mild LVH. Systolic function was normal.   The estimated ejection fraction was in the range of 55% to 60%.   Wall motion was normal; there were no regional wall motion   abnormalities. Features are consistent with a pseudonormal left   ventricular filling pattern, with concomitant abnormal relaxation   and increased filling pressure (grade 2 diastolic dysfunction).   Doppler parameters are consistent with high ventricular filling   pressure. - Mitral valve: There was mild regurgitation. - Left atrium: The atrium was severely dilated. - Right ventricle: The cavity size was mildly dilated. Wall   thickness was normal. - Tricuspid valve: There was mild regurgitation. - Pericardium, extracardiac: A trivial pericardial effusion was   identified posterior to the heart.  _____________     History of Present Illness     Kimberly [name] is a 77 y.o. female with history of normal cath in 2010, followed by low risk NST in 2016, chronic diastolic HF, HTN, chronic LEE, hypothyroidism and OSA, presented to the ED with complaint of chest pain and dyspnea and found to be bradycardic with HR in the 40s.  To recap prior history, Ms. Stolar is followed by [name] given h/o chronic diastolic HF and HTN. Pulmonology follows her OSA. She is on synthroid for hypothyroidism. She had  a cath in 2010 due to CP showing normal coronary arteries with elevated LVEDP felt to be due to diastolic CHF vs. Microvascular angina. Her CP resolved for a while but reoccurred and nuclear stress test 09/2014 was normal.She also had an echo in 2016 that showed normal LVEF at 55-60% with diastolic dysfunction. She saw [name] in clinic 09/2016 and low dose BB Toprol 25mg  daily was added to decrease HR for increased diastolic filling time in the setting of chronic diastolic HF. She had f/u again in 01/2017 and BP was elevated and clonidine added to regimen (was maxed out on lisinopril and resistant to higher doses of amlodipine due to LEE, also taking Lasix 20 mg and metoprolol 25 mg). She presented to the ED 06/12/17 with reports of CP and dyspnea/ on/off for 3 days. EKG showed junctional bradycardia with rate at 43 bpm. BMP shows normal K at 3.9. SCr is 1.11, c/w baseline. CBC WNL. TSH was normal. She was admitted for further evaluation. The admission H/P's states pt was questioned about possible inadvertent overdose on meds but she deniedthis. Her daughter questions this ? Some underlying memory issues.    Hospital Course    1. Chest pressure felt due to symptomatic junctional bradycardia - clonidine was discontinued and metoprolol was held. Troponins were negative and no further ischemic workup was recommended. The patient did report some respiratory distress but no objective findings on exam. Affect appears to have been labile at times per notes. 2D Echo showed mild LVH, EF 55-60%, grade 2 DD, mild MR, severe LAE, mildly dilated  RV, trivial pericardial effusion. This morning her HR has improved and she is free of any CP or dyspnea.   2. HTN - with holding of clonidine and beta blocker, her blood pressure increased as expected, up to 200 systolic yesterday although one hour later was recorded as 130. Norvasc was increased to 10mg  daily. She was sent home with PRN hydralazine and instruction to take 10 mg  for SBP greater than 170.  She should take BP at least twice daily for two weeks. Dr. Antoine Poche recommends out patient TOC appt and to wear a monitor after this appt.   3. Chronic diastolic CHF - appeared euvolemic during admission without acute findings. 2D Echo as above.  4. OSA - compliant with CPAP.  5. Hypothyroidism - TSH was noted to be 42. We did not make any adjustments this admission given question raised by daughter of compliance issues but have asked her to touch base with her endocrinologist to discuss further management.  HR was still bradycardic during overnight hours but is now in the 50s during daytime hours. The patient feels well and wishes to be discharged. I have sent a message to our office's scheduling team requesting a follow-up appointment, and our office will call the patient with this information. Clonazepam and atorvastatin were removed from med list this admission by pharmacy as patient indicated she was not taking these (she is on pravastatin). Dr. Antoine Poche has seen and examined the patient today and feels she is stable for discharge. She was asked not to drive until seen back in follow-up given bradycardia this admission. Plan reviewed with daughter in detail (per pt request) as well who will relay to patient and help enact plan at home. _____________  Discharge Vitals Blood pressure (!) 170/70, pulse now 59 per telemetry, temperature (!) 97.2 F (36.2 C), temperature source Oral, resp. rate 18, height 5' (1.524 m), weight 179 lb (81.2 kg), SpO2 99 %.  Filed Weights   06/12/17 1334  Weight: 179 lb (81.2 kg)    Labs & Radiologic Studies    CBC Recent Labs    06/12/17 1331  WBC 7.5  HGB 12.6  HCT 38.4  MCV 87.5  PLT 207   Basic Metabolic Panel Recent Labs    16/10/96 1331 06/12/17 1955 06/13/17 0615  NA 139  --  139  K 3.9  --  3.8  CL 104  --  103  CO2 23  --  24  GLUCOSE 103*  --  99  BUN 23*  --  22*  CREATININE 1.11*  --  1.19*  CALCIUM 10.3   --  9.7  MG  --  2.1  --    Cardiac Enzymes Recent Labs    06/13/17 0042 06/13/17 0615 06/13/17 1123  TROPONINI <0.03 <0.03 <0.03   Thyroid Function Tests Recent Labs    06/12/17 1726  TSH 42.727*   _____________  Dg Chest 2 View  Result Date: 06/12/2017 CLINICAL DATA:  Short of breath with chest heaviness beginning 3 days ago. EXAM: CHEST  2 VIEW COMPARISON:  01/23/2016 FINDINGS: Cardiac silhouette is mildly enlarged. No mediastinal or hilar masses. No evidence of adenopathy. Lungs are mildly hyperexpanded but clear. No pleural effusion or pneumothorax. Skeletal structures are demineralized. There stable changes from previous left rotator cuff surgery. IMPRESSION: 1. No acute cardiopulmonary disease. 2. Stable cardiomegaly. Electronically Signed   By: Amie Portland M.D.   On: 06/12/2017 14:34   Disposition   Pt is being discharged home today  in good condition.  Follow-up Plans & Appointments    Follow-up Information    Quintella Reicherturner, Traci R, MD Follow up.   Specialty:  Cardiology Why:  Our office will call you for a follow-up appointment. Please call the office if you have not heard from us within 3 days. You must bring ALL medicine bottles to this appointment. Contact information: [address] [place] [address] [phone]        Carlus PavlovGherghe, Cristina, MD Follow up.   Specialty:  Internal Medicine Why:  Your TSH was elevated at 42, indicating you might need more thyroid medication in your dose. Please call your endocrinologist on Monday to discuss further evaluation and treatment plan. Contact information: [address] [place] [address] [phone]          Discharge Instructions    Diet - low sodium heart healthy   Complete by:  As directed    Discharge instructions   Complete by:  As directed    - Your amlodipine was increased to 10mg  daily.  - Your clonidine and metoprolol were stopped due to low heart rate.  -  There were two medicines on your list with lisinopril in them. You will CONTINUE to take the combination pill with lisinopril/hydrochlorothiazide in it. The medicine with just lisinopril in it was removed from your list.  - You were given a prescription for hydralazine 10mg  to take as needed for blood pressure greater than 170 (may take every 6 hours as needed).    - Please monitor your blood pressure at home twice a day for two weeks. Call your doctor if you still tend to get readings of greater than 140 on the top number or 90 on the bottom number. Bring log into your appointment.   Increase activity slowly   Complete by:  As directed    Do not drive until cleared by cardiologist.      Discharge Medications   Allergies as of 06/14/2017      Reactions   Percocet [oxycodone-acetaminophen] Shortness Of Breath, Other (See Comments)   Pt couldn't breathe or catch her breath   Codeine Other (See Comments)   hyperactivity   Other Other (See Comments)   Some pain medication [facility] caused hallucinations    Phenergan [promethazine Hcl] Other (See Comments)   Hallucinations   Clonidine Derivatives    Bradycardia (06/2017)   Metoprolol    Bradycardia 06/2017      Medication List    STOP taking these medications   cloNIDine 0.1 MG tablet Commonly known as:  CATAPRES   lisinopril 40 MG tablet Commonly known as:  PRINIVIL,ZESTRIL   metoprolol succinate 25 MG 24 hr tablet Commonly known as:  TOPROL-XL     TAKE these medications   amLODipine 10 MG tablet Commonly known as:  NORVASC Take 1 tablet (10 mg total) by mouth daily. What changed:    medication strength  how much to take   aspirin 81 MG chewable tablet Chew 81 mg by mouth daily.   fenofibrate micronized 200 MG capsule Commonly known as:  LOFIBRA Take 200 mg by mouth daily before breakfast.   furosemide 20 MG tablet Commonly known as:  LASIX TAKE 1 TABLET BY MOUTH ONCE DAILY     hydrALAZINE 10 MG tablet Commonly known as:  APRESOLINE Take 1 tablet (10 mg total) by mouth every 6 (six) hours as needed (systolic (top) blood pressure greater than 170).  levothyroxine 137 MCG tablet Commonly known as:  SYNTHROID, LEVOTHROID TAKE 1 TABLET BY MOUTH BEFORE BREAKFAST (NEED  APPOINTMENT  FOR  FURTHER  REFILLS)   lisinopril-hydrochlorothiazide 20-12.5 MG tablet Commonly known as:  PRINZIDE,ZESTORETIC Take 1 tablet by mouth daily.   nitroGLYCERIN 0.4 MG SL tablet Commonly known as:  NITROSTAT Place 0.4 mg under the tongue every 5 (five) minutes as needed for chest pain.   PARoxetine 20 MG tablet Commonly known as:  PAXIL Take 60 mg by mouth every morning.   pravastatin 40 MG tablet Commonly known as:  PRAVACHOL Take 40 mg by mouth daily.   SOOTHEE EX Apply 1 drop topically daily. Eye drops   traMADol 50 MG tablet Commonly known as:  ULTRAM Take 1-2 tablets (50-100 mg total) by mouth every 6 (six) hours as needed for moderate pain or severe pain.   Vitamin D3 5000 units Caps Take 5,000 Units by mouth daily.   zolpidem 10 MG tablet Commonly known as:  AMBIEN Take 5 mg by mouth at bedtime. For sleep        Allergies:  Allergies  Allergen Reactions  . Percocet [Oxycodone-Acetaminophen] Shortness Of Breath and Other (See Comments)    Pt couldn't breathe or catch her breath  . Codeine Other (See Comments)    hyperactivity  . Other Other (See Comments)    Some pain medication given at [facility] for surgery caused hallucinations   . Phenergan [Promethazine Hcl] Other (See Comments)    Hallucinations   . Clonidine Derivatives     Bradycardia (06/2017)  . Metoprolol     Bradycardia 06/2017      Outstanding Labs/Studies   N/a, needs f/u endo for TSH  Duration of Discharge Encounter   Greater than 30 minutes including physician time.  Signed, Laurann Montana PA-C 06/14/2017, 10:57 AM

## 2017-06-14 NOTE — Progress Notes (Signed)
Progress Note  Patient Name: Kimberly [name] Date of Encounter: 06/14/2017  Primary Cardiologist:   Armanda Magicraci Turner, MD   Subjective   She feels well this morning and says that she slept well.  She denies chest pain or SOB.   Inpatient Medications    Scheduled Meds: . amLODipine  5 mg Oral Daily  . aspirin  81 mg Oral Daily  . atorvastatin  80 mg Oral q1800  . clonazePAM  1 mg Oral QHS  . fenofibrate  160 mg Oral Daily  . furosemide  20 mg Oral Daily  . heparin  5,000 Units Subcutaneous Q8H  . levothyroxine  137 mcg Oral QAC breakfast  . lisinopril  40 mg Oral Daily  . PARoxetine  60 mg Oral Daily  . zolpidem  5 mg Oral QHS   Continuous Infusions:  PRN Meds: nitroGLYCERIN, traMADol   Vital Signs    Vitals:   06/13/17 2018 06/14/17 0359 06/14/17 0459 06/14/17 0811  BP: (!) 186/79 (!) 202/84 (!) 130/50 (!) 170/70  Pulse: (!) 40   (!) 37  Resp: 18 14 18 16   Temp: 98.2 F (36.8 C) (!) 97.2 F (36.2 C)    TempSrc: Oral Oral    SpO2: 99%     Weight:      Height:        Intake/Output Summary (Last 24 hours) at 06/14/2017 0934 Last data filed at 06/14/2017 0300 Gross per 24 hour  Intake 240 ml  Output 450 ml  Net -210 ml   Filed Weights   06/12/17 1334  Weight: 179 lb (81.2 kg)    Telemetry    Sinus brady.  No junctional seen.  Probably ectopic atrial rhythm as well.  No pauses.   - Personally Reviewed  ECG    NA  Physical Exam   GEN: No  acute distress.   Neck: No  JVD Cardiac: RRR, no murmurs, rubs, or gallops.  Respiratory: Clear   to auscultation bilaterally. GI: Soft, nontender, non-distended, normal bowel sounds  MS:  No edema; No deformity. Neuro:   Nonfocal  Psych: Oriented and appropriate.  Very flat and depressed affect   Labs    Chemistry Recent Labs  Lab 06/12/17 1331 06/13/17 0615  NA 139 139  K 3.9 3.8  CL 104 103  CO2 23 24  GLUCOSE 103* 99  BUN 23* 22*  CREATININE 1.11* 1.19*  CALCIUM 10.3 9.7  GFRNONAA 47* 43*  GFRAA  54* 50*  ANIONGAP 12 12     Hematology Recent Labs  Lab 06/12/17 1331  WBC 7.5  RBC 4.39  HGB 12.6  HCT 38.4  MCV 87.5  MCH 28.7  MCHC 32.8  RDW 15.3  PLT 207    Cardiac Enzymes Recent Labs  Lab 06/12/17 1955 06/13/17 0042 06/13/17 0615 06/13/17 1123  TROPONINI <0.03 <0.03 <0.03 <0.03    Recent Labs  Lab 06/12/17 1357  TROPIPOC 0.00     BNPNo results for input(s): BNP, PROBNP in the last 168 hours.   DDimer No results for input(s): DDIMER in the last 168 hours.   Radiology    Dg Chest 2 View  Result Date: 06/12/2017 CLINICAL DATA:  Short of breath with chest heaviness beginning 3 days ago. EXAM: CHEST  2 VIEW COMPARISON:  01/23/2016 FINDINGS: Cardiac silhouette is mildly enlarged. No mediastinal or hilar masses. No evidence of adenopathy. Lungs are mildly hyperexpanded but clear. No pleural effusion or pneumothorax. Skeletal structures are demineralized. There stable changes from  previous left rotator cuff surgery. IMPRESSION: 1. No acute cardiopulmonary disease. 2. Stable cardiomegaly. Electronically Signed   By: Amie Portland M.D.   On: 06/12/2017 14:34    Cardiac Studies   Echo:  06/13/17  Study Conclusions  - Left ventricle: The cavity size was normal. Wall thickness was   increased in a pattern of mild LVH. Systolic function was normal.   The estimated ejection fraction was in the range of 55% to 60%.   Wall motion was normal; there were no regional wall motion   abnormalities. Features are consistent with a pseudonormal left   ventricular filling pattern, with concomitant abnormal relaxation   and increased filling pressure (grade 2 diastolic dysfunction).   Doppler parameters are consistent with high ventricular filling   pressure. - Mitral valve: There was mild regurgitation. - Left atrium: The atrium was severely dilated. - Right ventricle: The cavity size was mildly dilated. Wall   thickness was normal. - Tricuspid valve: There was mild  regurgitation. - Pericardium, extracardiac: A trivial pericardial effusion was   identified posterior to the heart.  Patient Profile     77 y.o. female with a history of normal cath in 2010, followed by low risk NST in 2016, chronic diastolic HF, HTN, chronic LEE, hypothyroidism and OSA, presented to the ED with complaint of chest pain and dyspnea and found to be bradycardic with HR in the 40s.    Assessment & Plan    CHEST PAIN:   Troponin was negative even after acute complaints yesterday morning.  No further ischemia work up.   Bradycardia:    HR is improved and can be followed as an out patient.  See below for meds  HTN:  BP was recorded at 200 systolic yesterday.  However, without any meds one hour later it was reported at 130.  It is running high and I would like to avoid beta blocker or clonidine.   Increase Norvasc to 10 mg daily and send home with PRN hydralazine and instruction to take 10 mg for SBP greater than 170.  She should take BP at least twice daily for two weeks.  She needs out patient TOC appt and to wear a monitor after this appt.   SLEEP APNEA:  She is using home CPAP.       For questions or updates, please contact [facility] Please consult [email or web address] for contact info under Cardiology/STEMI.   Signed, Rollene Rotunda, MD  06/14/2017, 9:34 AM

## 2017-06-15 ENCOUNTER — Telehealth: Payer: Self-pay

## 2017-06-15 NOTE — Telephone Encounter (Signed)
-----   Message from Laurann Montanaayna N Dunn, [place]-C sent at 06/14/2017 10:30 AM EST ----- Regarding: Discharged today: needs TOC f/u Please schedule this patient for a follow-up appointment and call them with that information.  Primary Cardiologist: Mayford Knifeurner Date of Discharge: 06/14/2017 Appointment Needed Within: 7-10 days Appointment Type: TOC visit  Thank you! Dayna Dunn PA-C

## 2017-06-15 NOTE — Telephone Encounter (Signed)
Patient [facility] on 06/14/17.  Patient understands to follow up with provider Chelsea AusVin Bhagat, PA on 06/23/17 at 2:30 PM at [address], [place], [place] [address].  Patient understands discharge instructions? Yes  Patient understands medications and regiment? Yes  Patient understands to bring all medications to this visit? Yes  Patient was only able to come in the afternoon for her appointment so that her daughter can bring her.

## 2017-06-18 ENCOUNTER — Ambulatory Visit: Payer: Medicare Other | Admitting: Internal Medicine

## 2017-06-22 NOTE — Progress Notes (Signed)
Cardiology Office Note    Date:  06/23/2017   ID:  Kimberly [name], DOB 17-Dec-1940, MRN [id number]  PCP:  Clayborn Heron, MD  Cardiologist: [name]  Chief Complaint: Hospital follow up   History of Present Illness:   Kimberly [name] is a 77 y.o. female with history of normal cath in 2010, followed by low risk NST in 2016, chronic diastolic HF, HTN, chronic LEE, hypothyroidism and OSA presents for hospital follow up.   She had a cath in 2010 due to CP showing normal coronary arteries with elevated LVEDP felt to be due to diastolic CHF vs. Microvascular angina. Her CP resolved for a while but reoccurred and nuclear stress test 09/2014 was normal.She also had an echo in 2016 that showed normal LVEF at 55-60% with diastolic dysfunction. She saw [name] in clinic 09/2016 and low dose BB Toprol 25mg  daily was added to decrease HR for increased diastolic filling time in the setting of chronic diastolic HF.   Admitted 2/1-2/3 for junctional bradycardia. Initially presented with complaint of chest pain and dyspnea and found to be bradycardic with HR in the 40s.Chest pressure felt due to symptomatic junctional bradycardia. Clonidine was discontinued and metoprolol was held. Troponins were negative and no further ischemic workup was recommended. The patient did report some respiratory distress but no objective findings on exam. Affect appears to have been labile at times per notes. 2D Echo showed mild LVH, EF 55-60%, grade 2 DD, mild MR, severe LAE, mildly dilated RV, trivial pericardial effusion. Norvasc added to elevated blood pressure. She was sent home with PRN hydralazine and instruction to take 10 mg for SBP greater than 170. HR was still bradycardic during overnight hours but is now in the 50s during daytime hours. Advised to follow up with PCP for TSH of 42. ? Compliance.   Here today for follow up.  Her  weakness has been improving.  He denies any chest pain, shortness of breath,  orthopnea, PND, syncope, dizziness or melena.  Has noted some swelling in her leg.  She eats soups and food rick in salt. has gained 2 pounds since discharge.  She checks her pulse periodically at home, they have been running in 60s.  Past Medical History:  Diagnosis Date  . Acid reflux   . Anxiety   . Arthritis   . Back spasm   . Bradycardia    a. junctional and sinus brady during admission 06/2017 requiring cessation of beta blocker and clonidine.  . Chronic diastolic heart failure (HCC) 03/20/2015  . Depression   . Diabetes mellitus without complication (HCC)    diet controled  . Dietary noncompliance   . Edema extremities   . Frequency of urination   . Fuch's endothelial dystrophy    bilateral eyes  . Headache    occ  . HOH (hard of hearing)    wears hearing aid in left ear  . Hypercholesteremia   . Hypertensive heart disease with CHF (HCC) 09/11/2014  . Insomnia   . Normal coronary arteries 2010   cath with no obstructive epicardial disease.  Done for CP which was felt to be due to microvascular angina or diastolic HF  . OSA on CPAP    Followed by Dr. Fannie Knee    Past Surgical History:  Procedure Laterality Date  . ABDOMINAL HYSTERECTOMY    . APPENDECTOMY    . CARDIAC CATHETERIZATION     no CAD  . CESAREAN SECTION  x 2  . CHOLECYSTECTOMY    . COLONOSCOPY    . EYE SURGERY Right    implant  . ROTATOR CUFF REPAIR Left   . SHOULDER ARTHROSCOPY WITH SUBACROMIAL DECOMPRESSION AND BICEP TENDON REPAIR Left 08/24/2012   Procedure: SHOULDER ARTHROSCOPY WITH SUBACROMIAL DECOMPRESSION AND BICEP TENDON REPAIR;  Surgeon: Cammy CopaGregory Scott Dean, MD;  [facility];  Service: Orthopedics;  Laterality: Left;  Left Shoulder Arthroscopy, Debridement, Biceps Tenotomy  . THYROIDECTOMY N/A 06/12/2015   Procedure: TOTAL THYROIDECTOMY;  Surgeon: Darnell Levelodd Gerkin, MD;  [facility];  Service: General;  Laterality: N/A;    Current Medications: Prior to Admission medications   Medication  Sig Start Date End Date Taking? Authorizing Provider  amLODipine (NORVASC) 10 MG tablet Take 1 tablet (10 mg total) by mouth daily. 06/14/17   Dunn, Tacey Ruizayna N, PA-C  aspirin 81 MG chewable tablet Chew 81 mg by mouth daily.    [provider]  Cholecalciferol (VITAMIN D3) 5000 units CAPS Take 5,000 Units by mouth daily.    [provider]  fenofibrate micronized (LOFIBRA) 200 MG capsule Take 200 mg by mouth daily before breakfast.    [provider]  furosemide (LASIX) 20 MG tablet TAKE 1 TABLET BY MOUTH ONCE DAILY 05/08/17   Quintella Reicherturner, Traci R, MD  hydrALAZINE (APRESOLINE) 10 MG tablet Take 1 tablet (10 mg total) by mouth every 6 (six) hours as needed (systolic (top) blood pressure greater than 170). 06/14/17 06/14/18  Dunn, Tacey Ruizayna N, PA-C  levothyroxine (SYNTHROID, LEVOTHROID) 137 MCG tablet TAKE 1 TABLET BY MOUTH BEFORE BREAKFAST (NEED  APPOINTMENT  FOR  FURTHER  REFILLS) 05/27/17   Carlus PavlovGherghe, Cristina, MD  Lido-Capsaicin-Men-Methyl Sal (SOOTHEE EX) Apply 1 drop topically daily. Eye drops    [provider]  lisinopril-hydrochlorothiazide (PRINZIDE,ZESTORETIC) 20-12.5 MG tablet Take 1 tablet by mouth daily.    [provider]  nitroGLYCERIN (NITROSTAT) 0.4 MG SL tablet Place 0.4 mg under the tongue every 5 (five) minutes as needed for chest pain.     [provider]  PARoxetine (PAXIL) 20 MG tablet Take 60 mg by mouth every morning.    [provider]  pravastatin (PRAVACHOL) 40 MG tablet Take 40 mg by mouth daily.    [provider]  traMADol (ULTRAM) 50 MG tablet Take 1-2 tablets (50-100 mg total) by mouth every 6 (six) hours as needed for moderate pain or severe pain. 06/13/15   Darnell LevelGerkin, Todd, MD  zolpidem (AMBIEN) 10 MG tablet Take 5 mg by mouth at bedtime. For sleep    [provider]    Allergies:   Percocet [oxycodone-acetaminophen]; Codeine; Other; Phenergan [promethazine hcl]; Clonidine derivatives; and Metoprolol   Social  History   Socioeconomic History  . Marital status: Married    Spouse name: None  . Number of children: 2  . Years of education: None  . Highest education level: None  Social Needs  . Financial resource strain: None  . Food insecurity - worry: None  . Food insecurity - inability: None  . Transportation needs - medical: None  . Transportation needs - non-medical: None  Occupational History  . Occupation: Radiographer, therapeuticretired-assistant teacher  Tobacco Use  . Smoking status: Never Smoker  . Smokeless tobacco: Never Used  Substance and Sexual Activity  . Alcohol use: No    Alcohol/week: 0.0 oz  . Drug use: No  . Sexual activity: None  Other Topics Concern  . None  Social History Narrative  . None     Family  History:  The patient's family history includes Cancer in her sister; Stomach cancer in her mother.   ROS:   Please see the history of present illness.    ROS All other systems reviewed and are negative.   PHYSICAL EXAM:   VS:  BP 128/76   Pulse 68   Ht 5' (1.524 m)   Wt 181 lb (82.1 kg)   BMI 35.35 kg/m    GEN: Well nourished, well developed, in no acute distress  HEENT: normal  Neck: no JVD, carotid bruits, or masses Cardiac: RRR; no murmurs, rubs, or gallops, trace BL LE edema  Respiratory:  clear to auscultation bilaterally, normal work of breathing GI: soft, nontender, nondistended, + BS MS: no deformity or atrophy  Skin: warm and dry, no rash Neuro:  Alert and Oriented x 3, Strength and sensation are intact Psych: euthymic mood, full affect  Wt Readings from Last 3 Encounters:  06/23/17 181 lb (82.1 kg)  06/12/17 179 lb (81.2 kg)  01/13/17 193 lb 6.4 oz (87.7 kg)      Studies/Labs Reviewed:   EKG:  EKG is not ordered today.  Recent Labs: 06/12/2017: Hemoglobin 12.6; Magnesium 2.1; Platelets 207; TSH 42.727 06/13/2017: BUN 22; Creatinine, Ser 1.19; Potassium 3.8; Sodium 139   Lipid Panel    Component Value Date/Time   CHOL 152 10/02/2015 0901   TRIG 169 (H)  10/02/2015 0901   HDL 30 (L) 10/02/2015 0901   CHOLHDL 5.1 (H) 10/02/2015 0901   VLDL 34 (H) 10/02/2015 0901   LDLCALC 88 10/02/2015 0901    Additional studies/ records that were reviewed today include:   Echocardiogram: 06/13/17 Study Conclusions  - Left ventricle: The cavity size was normal. Wall thickness was   increased in a pattern of mild LVH. Systolic function was normal.   The estimated ejection fraction was in the range of 55% to 60%.   Wall motion was normal; there were no regional wall motion   abnormalities. Features are consistent with a pseudonormal left   ventricular filling pattern, with concomitant abnormal relaxation   and increased filling pressure (grade 2 diastolic dysfunction).   Doppler parameters are consistent with high ventricular filling   pressure. - Mitral valve: There was mild regurgitation. - Left atrium: The atrium was severely dilated. - Right ventricle: The cavity size was mildly dilated. Wall   thickness was normal. - Tricuspid valve: There was mild regurgitation. - Pericardium, extracardiac: A trivial pericardial effusion was   identified posterior to the heart.    ASSESSMENT & PLAN:    1. Junctional bradycardia - Now off metoprolol.  Heart rate stable in the 60s.  Her weakness has been improving.  No need for monitor at currently. She will let us know if worsening of symptoms.  2. Acute on chronic diastolic CHF - Due to excess salt intake.  Advised to limit.  Increase Lasix for a few days and then back to regular dose.  3. Hypothyroidism - TSH of 42. Follow up with PCP/endocrinologist    Medication Adjustments/Labs and Tests Ordered: Current medicines are reviewed at length with the patient today.  Concerns regarding medicines are outlined above.  Medication changes, Labs and Tests ordered today are listed in the Patient Instructions below. Patient Instructions  Your physician has recommended you make the following change in your  medication:  INCREASE FUROSEMIDE TO 40 MG FOR 2 DAYS THEN RESUME 20 MG EVERY DAY   Your physician recommends that you schedule a follow-up appointment in: 3 MONTHS  WITH DR Shirl Harris, [place], [place]  06/23/2017 3:06 PM    [facility] [address] [place], [place], [place]  [address] Phone: [phone]; Fax: [phone]

## 2017-06-23 ENCOUNTER — Ambulatory Visit: Payer: Medicare Other | Admitting: Physician Assistant

## 2017-06-23 ENCOUNTER — Encounter: Payer: Self-pay | Admitting: Physician Assistant

## 2017-06-23 VITALS — BP 128/76 | HR 68 | Ht 60.0 in | Wt 181.0 lb

## 2017-06-23 DIAGNOSIS — I110 Hypertensive heart disease with heart failure: Secondary | ICD-10-CM

## 2017-06-23 DIAGNOSIS — I5033 Acute on chronic diastolic (congestive) heart failure: Secondary | ICD-10-CM | POA: Diagnosis not present

## 2017-06-23 DIAGNOSIS — I10 Essential (primary) hypertension: Secondary | ICD-10-CM

## 2017-06-23 DIAGNOSIS — R001 Bradycardia, unspecified: Secondary | ICD-10-CM | POA: Diagnosis not present

## 2017-06-23 DIAGNOSIS — I5032 Chronic diastolic (congestive) heart failure: Secondary | ICD-10-CM

## 2017-06-23 MED ORDER — NITROGLYCERIN 0.4 MG SL SUBL: 0.4000 mg | SUBLINGUAL_TABLET | SUBLINGUAL | 4 refills | Status: DC | PRN

## 2017-06-23 NOTE — Patient Instructions (Addendum)
Your physician has recommended you make the following change in your medication:  INCREASE FUROSEMIDE TO 40 MG FOR 2 DAYS THEN RESUME 20 MG EVERY DAY   Your physician recommends that you schedule a follow-up appointment in: 3 MONTHS WITH DR Mayford KnifeURNER

## 2017-06-25 ENCOUNTER — Ambulatory Visit: Payer: Medicare Other | Admitting: Internal Medicine

## 2017-06-25 ENCOUNTER — Encounter: Payer: Self-pay | Admitting: Internal Medicine

## 2017-06-25 VITALS — BP 168/84 | HR 71 | Ht 60.0 in | Wt 179.4 lb

## 2017-06-25 DIAGNOSIS — E890 Postprocedural hypothyroidism: Secondary | ICD-10-CM

## 2017-06-25 DIAGNOSIS — Z9889 Other specified postprocedural states: Secondary | ICD-10-CM | POA: Diagnosis not present

## 2017-06-25 LAB — TSH: TSH: 16.43 u[IU]/mL — ABNORMAL HIGH (ref 0.35–4.50)

## 2017-06-25 LAB — T4, FREE: Free T4: 1.06 ng/dL (ref 0.60–1.60)

## 2017-06-25 NOTE — Progress Notes (Signed)
Patient ID: Kimberly [name], female   DOB: Mar 10, 1941, 77 y.o.   MRN: [id number]   HPI  Kimberly [name] is a 77 y.o.-year-old female, returning for f/u for hypercalcemia, dx 2012, postsurgical hypothyroidism. Last OV 1 year and 5 months ago.  Patient was hospitalized recently and was found to have junctional bradycardia, and had a TSH of 42 during this hospitalization.  Previously 62. Per her daughter's report to the hospital staff, patient has compliance issues with levothyroxine.  S/p Thyroidectomy: She had thyroidectomy for substernal goiter in 05/2015: Benign, nodular, pathology  Postsurgical hypothyroidism: Pt is on levothyroxine 137 mcg daily, taken: - daily  - denies missing ANY doses - at 9 am - fasting - + b'fast 20 min later - + Ca at 9:30 am (did not separate it more as discussed) - no Fe, MVI, PPIs - not on Biotin  Reviewed TFTs >> very high: Lab Results  Component Value Date   TSH 42.727 (H) 06/12/2017   TSH 41.60 (H) 01/16/2016   TSH 31.80 (H) 09/18/2015   TSH 1.128 08/25/2014   FREET4 0.84 01/16/2016   FREET4 0.83 09/18/2015  07/31/2015: TSH 58  Calcium normalized after her thyroid surgery: Lab Results  Component Value Date   CALCIUM 9.7 06/13/2017   PHOS 3.2 12/09/2013   Primary hyperparathyroidism: - Greatly improved/resolved:  I reviewed pt's pertinent labs: Lab Results  Component Value Date   PTH 19 09/18/2015   PTH Comment 09/18/2015   PTH 21 02/08/2015   PTH Comment 02/08/2015   PTH 17 12/09/2013   PTH Comment 12/09/2013   PTH 14.9 08/24/2012   CALCIUM 9.7 06/13/2017   CALCIUM 10.3 06/12/2017   CALCIUM 9.9 01/13/2017   CALCIUM 10.2 01/23/2016   CALCIUM 10.2 12/20/2015   CALCIUM 9.4 12/06/2015   CALCIUM 10.6 (H) 11/22/2015   CALCIUM 9.6 10/02/2015   CALCIUM CANCELED 09/18/2015   CALCIUM 9.8 06/13/2015  12/25/2014: Calcium 10.8 (8.6-10.3) 09/12/2013: Ca 10.8, iCa 5.7 (4.5-5.6)  She also has a history of vitamin D deficiency -  Last level  normal: Lab Results  Component Value Date   VD25OH 32.67 09/18/2015   VD25OH 28.65 (L) 02/08/2015   VD25OH 50.59 12/09/2013   Reviewed other pertinent labs: Component     Latest Ref Rng 02/08/2015 02/08/2015         2:14 PM  2:14 PM  Vitamin A (Retinoic Acid)     38 - 98 mcg/dL 89    Component     Latest Ref Rng 12/09/2013  Vitamin D 1, 25 (OH) Total     18 - 72 pg/mL 79 (H)  Vitamin D3 1, 25 (OH)      79  Vitamin D2 1, 25 (OH)      <8  PTH-Related Protein (PTH-RP)     14 - 27 pg/mL 16  Phosphorus     2.3 - 4.6 mg/dL 3.2   Urine calcium was high: Component     Latest Ref Rng 01/20/2014  Calcium, Ur      14  Calcium, 24 hour urine     100 - 250 mg/day 322 (H)  Creatinine, Urine      51.9  Creatinine, 24H Ur     700 - 1800 mg/day 1194   No osteoporosis, fractures, kidney stones  I reviewed pt's previous DXA scan - 2008: LUMBAR SPINE (L1-L3)  BMD: 1.273  T-score (% of young adult value): 2.3  Z-score (% adult age match value): 4.1  LEFT HIP (NECK)  BMD: 0.831  T-score (% of young adult value): -0.2  Z-score (% adult age match value): 1.4  Assessment: This patient is considered normal according to [facility]) criteria.   + h/o CKD. Last BUN/Cr: Lab Results  Component Value Date   BUN 22 (H) 06/13/2017   CREATININE 1.19 (H) 06/13/2017    She has had a technetium sestamibi scan (04/2015) that was negative and a CT scan of her neck (04/2015) that was negative for possible parathyroid nodules, but showed a substernal goiter with Th narrowing.  She also has a history of HTN; HL; DM2; CKD - sees Dr Clover Mealy.  ROS: Constitutional: no weight gain/no weight loss, + fatigue, + hot flashes, no subjective hypothermia Eyes: no blurry vision, no xerophthalmia ENT: + Sore throat, no nodules palpated in throat, + dysphagia, no odynophagia, no hoarseness Cardiovascular: no CP/ + SOB/no palpitations/+ leg swelling Respiratory: no cough/+ SOB/no  wheezing Gastrointestinal: no N/no V/no D/+ C/no acid reflux Musculoskeletal: no muscle aches/+ joint aches Skin: no rashes, no hair loss Neurological: no tremors/no numbness/no tingling/no dizziness, +  I reviewed pt's medications, allergies, PMH, social hx, family hx, and changes were documented in the history of present illness. Otherwise, unchanged from my initial visit note.   Past Medical History:  Diagnosis Date  . Acid reflux   . Anxiety   . Arthritis   . Back spasm   . Bradycardia    a. junctional and sinus brady during admission 06/2017 requiring cessation of beta blocker and clonidine.  . Chronic diastolic heart failure ([place]) 03/20/2015  . Depression   . Diabetes mellitus without complication ([place])    diet controled  . Dietary noncompliance   . Edema extremities   . Frequency of urination   . Fuch's endothelial dystrophy    bilateral eyes  . Headache    occ  . HOH (hard of hearing)    wears hearing aid in left ear  . Hypercholesteremia   . Hypertensive heart disease with CHF ([place]) 09/11/2014  . Insomnia   . Normal coronary arteries 2010   cath with no obstructive epicardial disease.  Done for CP which was felt to be due to microvascular angina or diastolic HF  . OSA on CPAP    Followed by Dr. Keturah Barre   Past Surgical History:  Procedure Laterality Date  . ABDOMINAL HYSTERECTOMY    . APPENDECTOMY    . CARDIAC CATHETERIZATION     no CAD  . CESAREAN SECTION     x 2  . CHOLECYSTECTOMY    . COLONOSCOPY    . EYE SURGERY Right    implant  . ROTATOR CUFF REPAIR Left   . SHOULDER ARTHROSCOPY WITH SUBACROMIAL DECOMPRESSION AND BICEP TENDON REPAIR Left 08/24/2012   Procedure: SHOULDER ARTHROSCOPY WITH SUBACROMIAL DECOMPRESSION AND BICEP TENDON REPAIR;  Surgeon: Meredith Pel, MD;  Location: [place];  Service: Orthopedics;  Laterality: Left;  Left Shoulder Arthroscopy, Debridement, Biceps Tenotomy  . THYROIDECTOMY N/A 06/12/2015   Procedure: TOTAL THYROIDECTOMY;   Surgeon: Armandina Gemma, MD;  Location: [place];  Service: General;  Laterality: N/A;   History   Social History  . Marital Status: Married    Spouse Name: N/A    Number of Children: 2   Occupational History  . retired-assistant teacher    Social History Main Topics  . Smoking status: Never Smoker   . Smokeless tobacco: Not on file  . Alcohol Use: No  . Drug Use: No  Current Outpatient Medications on File Prior to Visit  Medication Sig Dispense Refill  . amLODipine (NORVASC) 10 MG tablet Take 1 tablet (10 mg total) by mouth daily. 30 tablet 3  . aspirin 81 MG chewable tablet Chew 81 mg by mouth daily.    . Cholecalciferol (VITAMIN D3) 5000 units CAPS Take 5,000 Units by mouth daily.    . fenofibrate micronized (LOFIBRA) 200 MG capsule Take 200 mg by mouth daily before breakfast.    . furosemide (LASIX) 20 MG tablet TAKE 1 TABLET BY MOUTH ONCE DAILY 90 tablet 2  . hydrALAZINE (APRESOLINE) 10 MG tablet Take 1 tablet (10 mg total) by mouth every 6 (six) hours as needed (systolic (top) blood pressure greater than 170). 60 tablet 1  . levothyroxine (SYNTHROID, LEVOTHROID) 137 MCG tablet TAKE 1 TABLET BY MOUTH BEFORE BREAKFAST (NEED  APPOINTMENT  FOR  FURTHER  REFILLS) 90 tablet 0  . Lido-Capsaicin-Men-Methyl Sal (SOOTHEE EX) Apply 1 drop topically daily. Eye drops    . lisinopril-hydrochlorothiazide (PRINZIDE,ZESTORETIC) 20-12.5 MG tablet Take 1 tablet by mouth daily.    . nitroGLYCERIN (NITROSTAT) 0.4 MG SL tablet Place 1 tablet (0.4 mg total) under the tongue every 5 (five) minutes as needed for chest pain. 25 tablet 4  . pravastatin (PRAVACHOL) 40 MG tablet Take 40 mg by mouth daily.    . traMADol (ULTRAM) 50 MG tablet Take 1-2 tablets (50-100 mg total) by mouth every 6 (six) hours as needed for moderate pain or severe pain. 15 tablet 0  . zolpidem (AMBIEN) 10 MG tablet Take 5 mg by mouth at bedtime. For sleep     No current facility-administered medications on file prior to visit.     Allergies  Allergen Reactions  . Percocet [Oxycodone-Acetaminophen] Shortness Of Breath and Other (See Comments)    Pt couldn't breathe or catch her breath  . Codeine Other (See Comments)    hyperactivity  . Other Other (See Comments)    Some pain medication given at [facility] for surgery caused hallucinations   . Phenergan [Promethazine Hcl] Other (See Comments)    Hallucinations   . Clonidine Derivatives     Bradycardia (06/2017)  . Metoprolol     Bradycardia 06/2017    Family History  Problem Relation Age of Onset  . Stomach cancer Mother   . Cancer Sister        intestinal   PE: BP (!) 168/84 (BP Location: Left Arm, Patient Position: Sitting, Cuff Size: Large)   Pulse 71   Ht 5' (1.524 m)   Wt 179 lb 6.4 oz (81.4 kg)   BMI 35.04 kg/m  Body mass index is 35.04 kg/m. Wt Readings from Last 3 Encounters:  06/25/17 179 lb 6.4 oz (81.4 kg)  06/23/17 181 lb (82.1 kg)  06/12/17 179 lb (81.2 kg)   Constitutional: overweight, in NAD Eyes: PERRLA, EOMI, no exophthalmos ENT: moist mucous membranes, no neck masses, thyroidectomy scar healed, no cervical lymphadenopathy Cardiovascular: RRR, No MRG Respiratory: CTA B Gastrointestinal: abdomen soft, NT, ND, BS+ Musculoskeletal: no deformities, strength intact in all 4 Skin: moist, warm, no rashes Neurological: no tremor with outstretched hands, DTR normal in all 4  Assessment: 1. S/p thyroidectomy  2. Postsurgical hypothyroidism  3. Hypercalcemia - ? primary hyperparathyroidism (PTH not increased...)  Plan: 1. S/p thyroidectomy -Thyroid scar healed -No complaints after her thyroid surgery  2. Postsurgical hypothyroidism - Patient very high TSH after her thyroidectomy, of 58, while she was taking levothyroxine along with calcium in  the morning.  I advised her repeatedly to separate calcium from her morning levothyroxine, but at today's visit she is still taking it very close (within 30 minutes) from the  thyroid hormone.  I again explained that if she continues to do so she will not absorb any of the levothyroxine so I strongly advised her again to move the calcium at least 4 hours later.  In fact, I advised her to move it with dinner. - latest thyroid labs reviewed with pt >> still very high.  I advised her that her recently diagnosed heart condition could have been related to her severe hypothyroidism. - she continues on LT4 137 mcg daily and we will continue this dose as she needs to start taking the levothyroxine correctly before we can evaluate if the dose is appropriate - we discussed about taking the thyroid hormone every day, with water, >30 minutes before breakfast, separated by >4 hours from acid reflux medications, calcium, iron, multivitamins.  - We will have her return for thyroid testing 1.5 months and for a visit in 3 months.  3. Hypercalcemia - Patient has had several instances of elevated calcium, with the highest level being at 11.1.  Of note, her PTH levels were repeatedly checked and they were normal.  A PTH RP level was normal, multiple myeloma workup was negative, vitamin A level was normal, vitamin D was normal or minimally low, she had a high 1, 25 dihydroxy vitamin D but a normal phosphorus.  Her urinary calcium was high in the past.  A technetium sestamibi scan, thyroid ultrasound, and neck CT did not show parathyroid masses.  She has no apparent complications from hypercalcemia: No history of nephrolithiasis, no abdominal pain, no bone pain.  Her calcium levels remain normal after her thyroidectomy. No intervention is needed for now.  Philemon Kingdom, MD PhD [facility] Endocrinology

## 2017-06-25 NOTE — Patient Instructions (Signed)
Please continue Levothyroxine 137 mcg daily.  Take the thyroid hormone every day, with water, at least 30 minutes before breakfast, separated by at least 4 hours from: - acid reflux medications - calcium - iron - multivitamins  PLEASE MOVE CALCIUM AT DINNERTIME.  PLEASE MOVE BREAKFAST 30 MIN AFTER LEVOTHYROXINE.  Please come back for a follow-up appointment in 3 months, but for labs in 1.5 months.

## 2017-06-29 ENCOUNTER — Telehealth: Payer: Self-pay

## 2017-06-29 DIAGNOSIS — E890 Postprocedural hypothyroidism: Secondary | ICD-10-CM

## 2017-06-29 NOTE — Telephone Encounter (Signed)
-----   Message from Carlus Pavlovristina Gherghe, MD sent at 06/26/2017  5:23 PM EST ----- Toni Amendourtney, can you please call pt: She apparently stopped at the lab by mistake now instead of 1.5 months from now.  However, her TSH has already improved.  Please continue with the plan as discussed at last visit and can you please order another set of TSH and free T4 for 1.5 months from now?

## 2017-07-13 ENCOUNTER — Telehealth: Payer: Self-pay | Admitting: Cardiology

## 2017-07-13 NOTE — Telephone Encounter (Signed)
Returned call to patient. Patient needed clarification on how to take her hydralazine. Instructed patient that hydralazine should be taken as needed for SBP greater than 170. Patient verbalized understanding and very thankful for the call.

## 2017-07-13 NOTE — Telephone Encounter (Signed)
New message    Pt c/o medication issue:  1. Name of Medication: lisinopril-hydrochlorothiazide (PRINZIDE,ZESTORETIC) 20-12.5 MG tablet  2. How are you currently taking this medication (dosage and times per day)? Take 1 tablet by mouth daily.  3. Are you having a reaction (difficulty breathing--STAT)? NO  4. What is your medication issue? Patient wants to confirm if she needs to be taking Lisinopril and hydrALAZINE (APRESOLINE) 10 MG tablet

## 2017-09-02 ENCOUNTER — Telehealth: Payer: Self-pay | Admitting: Internal Medicine

## 2017-09-02 ENCOUNTER — Other Ambulatory Visit: Payer: Self-pay

## 2017-09-02 MED ORDER — LEVOTHYROXINE SODIUM 137 MCG PO TABS: ORAL_TABLET | ORAL | 0 refills | Status: DC

## 2017-09-02 NOTE — Telephone Encounter (Signed)
Spoke to patient. Informed Rx sent. Confirmed appt on 05/16

## 2017-09-02 NOTE — Telephone Encounter (Signed)
Patient need a refill of medication  :  levothyroxine (SYNTHROID, LEVOTHROID) 137 MCG tablet [[id number]][[id number]]    Pharmacy:  [id number] - [place] - [address] [place] #:  --

## 2017-09-03 ENCOUNTER — Other Ambulatory Visit: Payer: Medicare Other

## 2017-09-24 ENCOUNTER — Encounter: Payer: Self-pay | Admitting: Internal Medicine

## 2017-09-24 ENCOUNTER — Ambulatory Visit: Payer: Medicare Other | Admitting: Internal Medicine

## 2017-09-24 VITALS — BP 170/90 | HR 66 | Ht 59.5 in | Wt 176.0 lb

## 2017-09-24 DIAGNOSIS — E559 Vitamin D deficiency, unspecified: Secondary | ICD-10-CM | POA: Diagnosis not present

## 2017-09-24 DIAGNOSIS — E890 Postprocedural hypothyroidism: Secondary | ICD-10-CM | POA: Diagnosis not present

## 2017-09-24 DIAGNOSIS — Z9889 Other specified postprocedural states: Secondary | ICD-10-CM

## 2017-09-24 LAB — TSH: TSH: 1.74 u[IU]/mL (ref 0.35–4.50)

## 2017-09-24 LAB — T4, FREE: Free T4: 1.19 ng/dL (ref 0.60–1.60)

## 2017-09-24 LAB — VITAMIN D 25 HYDROXY (VIT D DEFICIENCY, FRACTURES): VITD: 61.83 ng/mL (ref 30.00–100.00)

## 2017-09-24 NOTE — Patient Instructions (Addendum)
Please continue Levothyroxine 137 mcg daily.  Take the thyroid hormone every day, with water, at least 30 minutes before breakfast, separated by at least 4 hours from: - acid reflux medications - calcium - iron - multivitamins  Continue vitamin D 5000 units daily.  Please come back for a follow-up appointment in 6 months.

## 2017-09-24 NOTE — Progress Notes (Signed)
Patient ID: Kimberly [name], female   DOB: 09/04/1940, 77 y.o.   MRN: [id number]   HPI  Kimberly [name] is a 77 y.o.-year-old female, returning for f/u for hypercalcemia, dx 2012, postsurgical hypothyroidism. Last OV 3 months ago.  Before last visit, patient was hospitalized and was found to have junctional bradycardia and a TSH of 42.  At that time, per daughter, she had compliance issues with levothyroxine.  When I saw her last, she was still not taking the levothyroxine correctly, despite multiple discussions and written instructions.  She could not come for labs as she cannot drive and her daughter could not take time off to bring her.   S/p Thyroidectomy: She had thyroidectomy for substernal goiter in 05/2015: Benign, nodular, pathology  Postsurgical hypothyroidism:  Pt is on levothyroxine 137 mcg daily, taken: - in am - fasting - at least 30 min from b'fast - + Ca in pm - + Fe, MVI, PPIs - not on Biotin  Her TFTs remained uncontrolled as she was not taking the medication correctly. Lab Results  Component Value Date   TSH 16.43 (H) 06/25/2017   TSH 42.727 (H) 06/12/2017   TSH 41.60 (H) 01/16/2016   TSH 31.80 (H) 09/18/2015   TSH 1.128 08/25/2014   FREET4 1.06 06/25/2017   FREET4 0.84 01/16/2016   FREET4 0.83 09/18/2015  07/31/2015: TSH 58  Surprisingly, calcium normalized after her thyroid surgery: Lab Results  Component Value Date   CALCIUM 9.7 06/13/2017   PHOS 3.2 12/09/2013   Primary hyperparathyroidism: - greatly improved/resolved  I reviewed patient's pertinent labs: Lab Results  Component Value Date   PTH 19 09/18/2015   PTH Comment 09/18/2015   PTH 21 02/08/2015   PTH Comment 02/08/2015   PTH 17 12/09/2013   PTH Comment 12/09/2013   PTH 14.9 08/24/2012   CALCIUM 9.7 06/13/2017   CALCIUM 10.3 06/12/2017   CALCIUM 9.9 01/13/2017   CALCIUM 10.2 01/23/2016   CALCIUM 10.2 12/20/2015   CALCIUM 9.4 12/06/2015   CALCIUM 10.6 (H) 11/22/2015   CALCIUM  9.6 10/02/2015   CALCIUM CANCELED 09/18/2015   CALCIUM 9.8 06/13/2015  12/25/2014: Calcium 10.8 (8.6-10.3) 09/12/2013: Ca 10.8, iCa 5.7 (4.5-5.6)  She has a history of vitamin D deficiency, but last level was normal: Lab Results  Component Value Date   VD25OH 32.67 09/18/2015   VD25OH 28.65 (L) 02/08/2015   VD25OH 50.59 12/09/2013   On 5000 units vitamin D daily.  Reviewed other pertinent labs: Component     Latest Ref Rng 02/08/2015 02/08/2015         2:14 PM  2:14 PM  Vitamin A (Retinoic Acid)     38 - 98 mcg/dL 89    Component     Latest Ref Rng 12/09/2013  Vitamin D 1, 25 (OH) Total     18 - 72 pg/mL 79 (H)  Vitamin D3 1, 25 (OH)      79  Vitamin D2 1, 25 (OH)      <8  PTH-Related Protein (PTH-RP)     14 - 27 pg/mL 16  Phosphorus     2.3 - 4.6 mg/dL 3.2   Urine calcium was high: Component     Latest Ref Rng 01/20/2014  Calcium, Ur      14  Calcium, 24 hour urine     100 - 250 mg/day 322 (H)  Creatinine, Urine      51.9  Creatinine, 24H Ur     700 - 1800 mg/day 1194  No osteoporosis, fractures, kidney stones. Occasional falls.   I reviewed pt's previous DXA scan - 2008:  LUMBAR SPINE (L1-L3)  BMD: 1.273  T-score (% of young adult value): 2.3  Z-score (% adult age match value): 4.1  LEFT HIP (NECK)  BMD: 0.831  T-score (% of young adult value): -0.2  Z-score (% adult age match value): 1.4  Assessment: This patient is considered normal according to [facility]) criteria.   + CKD. Last BUN/Cr: Lab Results  Component Value Date   BUN 22 (H) 06/13/2017   CREATININE 1.19 (H) 06/13/2017    She has had a technetium sestamibi scan (04/2015) that was negative for a parathyroid adenoma e and a CT scan of her neck (04/2015) there was also negative for possible parathyroid nodule nodule but this showed a substernal goiter with tracheal narrowing.  She also has a history of HTN; HL; DM2; CKD - sees Dr Clover Mealy.  ROS: Constitutional: +  weight loss, no fatigue, no subjective hyperthermia, no subjective hypothermia Eyes: no blurry vision, no xerophthalmia ENT: no sore throat, no nodules palpated in throat, no dysphagia, no odynophagia, no hoarseness Cardiovascular: no CP/+ SOB/no palpitations/+ leg swelling Respiratory: no cough/+ SOB/no wheezing Gastrointestinal: no N/no V/no D/+ C/no acid reflux Musculoskeletal: no muscle aches/no joint aches Skin: no rashes, no hair loss Neurological: no tremors/no numbness/no tingling/no dizziness, + HAs  I reviewed pt's medications, allergies, PMH, social hx, family hx, and changes were documented in the history of present illness. Otherwise, unchanged from my initial visit note.   Past Medical History:  Diagnosis Date  . Acid reflux   . Anxiety   . Arthritis   . Back spasm   . Bradycardia    a. junctional and sinus brady during admission 06/2017 requiring cessation of beta blocker and clonidine.  . Chronic diastolic heart failure ([place]) 03/20/2015  . Depression   . Diabetes mellitus without complication ([place])    diet controled  . Dietary noncompliance   . Edema extremities   . Frequency of urination   . Fuch's endothelial dystrophy    bilateral eyes  . Headache    occ  . HOH (hard of hearing)    wears hearing aid in left ear  . Hypercholesteremia   . Hypertensive heart disease with CHF ([place]) 09/11/2014  . Insomnia   . Normal coronary arteries 2010   cath with no obstructive epicardial disease.  Done for CP which was felt to be due to microvascular angina or diastolic HF  . OSA on CPAP    Followed by Dr. Keturah Barre   Past Surgical History:  Procedure Laterality Date  . ABDOMINAL HYSTERECTOMY    . APPENDECTOMY    . CARDIAC CATHETERIZATION     no CAD  . CESAREAN SECTION     x 2  . CHOLECYSTECTOMY    . COLONOSCOPY    . EYE SURGERY Right    implant  . ROTATOR CUFF REPAIR Left   . SHOULDER ARTHROSCOPY WITH SUBACROMIAL DECOMPRESSION AND BICEP TENDON REPAIR Left  08/24/2012   Procedure: SHOULDER ARTHROSCOPY WITH SUBACROMIAL DECOMPRESSION AND BICEP TENDON REPAIR;  Surgeon: Meredith Pel, MD;  Location: [facility];  Service: Orthopedics;  Laterality: Left;  Left Shoulder Arthroscopy, Debridement, Biceps Tenotomy  . THYROIDECTOMY N/A 06/12/2015   Procedure: TOTAL THYROIDECTOMY;  Surgeon: Armandina Gemma, MD;  Location: [place];  Service: General;  Laterality: N/A;   History   Social History  . Marital Status: Married    Spouse  Name: N/A    Number of Children: 2   Occupational History  . retired-assistant teacher    Social History Main Topics  . Smoking status: Never Smoker   . Smokeless tobacco: Not on file  . Alcohol Use: No  . Drug Use: No   Current Outpatient Medications on File Prior to Visit  Medication Sig Dispense Refill  . amLODipine (NORVASC) 10 MG tablet Take 1 tablet (10 mg total) by mouth daily. 30 tablet 3  . aspirin 81 MG chewable tablet Chew 81 mg by mouth daily.    . Cholecalciferol (VITAMIN D3) 5000 units CAPS Take 5,000 Units by mouth daily.    . fenofibrate micronized (LOFIBRA) 200 MG capsule Take 200 mg by mouth daily before breakfast.    . furosemide (LASIX) 20 MG tablet TAKE 1 TABLET BY MOUTH ONCE DAILY 90 tablet 2  . hydrALAZINE (APRESOLINE) 10 MG tablet Take 1 tablet (10 mg total) by mouth every 6 (six) hours as needed (systolic (top) blood pressure greater than 170). 60 tablet 1  . levothyroxine (SYNTHROID, LEVOTHROID) 137 MCG tablet TAKE 1 TABLET BY MOUTH BEFORE BREAKFAST 90 tablet 0  . Lido-Capsaicin-Men-Methyl Sal (SOOTHEE EX) Apply 1 drop topically daily. Eye drops    . nitroGLYCERIN (NITROSTAT) 0.4 MG SL tablet Place 1 tablet (0.4 mg total) under the tongue every 5 (five) minutes as needed for chest pain. 25 tablet 4  . PARoxetine (PAXIL) 20 MG tablet   0  . traMADol (ULTRAM) 50 MG tablet Take 1-2 tablets (50-100 mg total) by mouth every 6 (six) hours as needed for moderate pain or severe pain. 15 tablet 0  . zolpidem  (AMBIEN) 10 MG tablet Take 5 mg by mouth at bedtime. For sleep     No current facility-administered medications on file prior to visit.    Allergies  Allergen Reactions  . Percocet [Oxycodone-Acetaminophen] Shortness Of Breath and Other (See Comments)    Pt couldn't breathe or catch her breath  . Codeine Other (See Comments)    hyperactivity  . Other Other (See Comments)    Some pain medication given at [facility] for surgery caused hallucinations   . Phenergan [Promethazine Hcl] Other (See Comments)    Hallucinations   . Clonidine Derivatives     Bradycardia (06/2017)  . Metoprolol     Bradycardia 06/2017    Family History  Problem Relation Age of Onset  . Stomach cancer Mother   . Cancer Sister        intestinal   PE: BP (!) 170/90 (BP Location: Left Arm, Patient Position: Sitting, Cuff Size: Large)   Pulse 66   Ht 4' 11.5" (1.511 m)   Wt 176 lb (79.8 kg)   SpO2 97%   BMI 34.95 kg/m  Body mass index is 34.95 kg/m. Wt Readings from Last 3 Encounters:  09/24/17 176 lb (79.8 kg)  06/25/17 179 lb 6.4 oz (81.4 kg)  06/23/17 181 lb (82.1 kg)   Constitutional: overweight, in NAD Eyes: PERRLA, EOMI, no exophthalmos ENT: moist mucous membranes, no neck masses, thyroidectomy scar healed, no cervical lymphadenopathy Cardiovascular: RRR, No MRG Respiratory: CTA B Gastrointestinal: abdomen soft, NT, ND, BS+ Musculoskeletal: no deformities, strength intact in all 4 Skin: moist, warm, no rashes Neurological: no tremor with outstretched hands, DTR normal in all 4  Assessment: 1. S/p thyroidectomy  2. Postsurgical hypothyroidism  3. Hypercalcemia - ? primary hyperparathyroidism (PTH not increased...)  Plan: 1. S/p thyroidectomy -Thyroidectomy scar healed - no numbness, dysesthesia, keloid  2. Postsurgical hypothyroidism - Patient very high TSH after her thyroidectomy, of 58, while she was taking levothyroxine along with calcium in the morning.  I advised her  repeatedly to separate calcium from her levothyroxine but she was still not doing this at last visit. - we discussed about taking the thyroid hormone every day, with water, >30 minutes before breakfast, separated by >4 hours from acid reflux medications, calcium, iron, multivitamins. Pt. is taking it correctly now. - latest thyroid labs reviewed with pt >> TSH high, but improving  - she continues on LT4 137 mcg daily - pt feels good on this dose. - will check thyroid tests today: TSH and fT4 - If labs are abnormal, she will need to return for repeat TFTs in 1.5 months  3. Hypercalcemia - Her calcium levels remain normal after her thyroidectomy. - Patient has had several instances of elevated calcium, with the highest being at 11.1.  Of note, her PTH levels were repeatedly checked and they were normal.  A PTHrp level was normal, multiple myeloma work-up was negative, vitamin A level was normal, vitamin D was normal or minimally low, she had a high 1, 25 dihydroxy vitamin D with a normal phosphorus.  Her urinary calcium was high in the past.  A technician sestamibi scan, thyroid ultrasound, and neck CT did not show any  parathyroid masses.   - She has no apparent complications from hypercalcemia.  No history of nephrolithiasis, abdominal pain, bone pain. - We will not recheck this today as calcium was normal 3 mo ago  Component     Latest Ref Rng & Units 09/24/2017  VITD     30.00 - 100.00 ng/mL 61.83  TSH     0.35 - 4.50 uIU/mL 1.74  T4,Free(Direct)     0.60 - 1.60 ng/dL 1.19  Excellent labs, I will advised her to continue everything the way she is taking them now.  Philemon Kingdom, MD PhD [facility] Endocrin[facility]

## 2017-09-25 ENCOUNTER — Ambulatory Visit: Payer: Medicare Other | Admitting: Cardiology

## 2017-09-25 ENCOUNTER — Encounter: Payer: Self-pay | Admitting: Cardiology

## 2017-09-25 VITALS — BP 148/72 | HR 61 | Ht 59.5 in | Wt 177.0 lb

## 2017-09-25 DIAGNOSIS — I10 Essential (primary) hypertension: Secondary | ICD-10-CM

## 2017-09-25 DIAGNOSIS — I5032 Chronic diastolic (congestive) heart failure: Secondary | ICD-10-CM | POA: Diagnosis not present

## 2017-09-25 DIAGNOSIS — R001 Bradycardia, unspecified: Secondary | ICD-10-CM | POA: Diagnosis not present

## 2017-09-25 NOTE — Patient Instructions (Signed)
Medication Instructions:  Your physician recommends that you continue on your current medications as directed. Please refer to the Current Medication list given to you today.  If you need a refill on your cardiac medications, please contact your pharmacy first.  Labwork: None ordered   Testing/Procedures: None ordered   Follow-Up: Your physician wants you to follow-up in: 6 months with Dr. Turner. You will receive a reminder letter in the mail two months in advance. If you don't receive a letter, please call our office to schedule the follow-up appointment.  Any Other Special Instructions Will Be Listed Below (If Applicable).   Thank you for choosing [facility]    Rena Traevion Poehler, RN  [phone]  If you need a refill on your cardiac medications before your next appointment, please call your pharmacy.   

## 2017-09-25 NOTE — Progress Notes (Signed)
Cardiology Office Note:    Date:  09/25/2017   ID:  Kimberly [name], DOB 1940/09/29, MRN [id number]  PCP:  Clayborn Heron, MD  Cardiologist:  Armanda Magic, MD    Referring MD: Clayborn Heron, MD   Chief Complaint  Patient presents with  . Congestive Heart Failure  . Hypertension    History of Present Illness:    Kimberly [name] is a 77 y.o. female with a hx of chronic diastolic CHF, tonic lower extremity edema and hypertension.  She had a cath in 2010 due to CP showing normal coronary arteries with elevated LVEDP felt to be due to diastolic CHF vs. Microvascular angina. Her CP resolved for a while but reoccurred and nuclear stress test 09/2014 was normal.  2D echocardiogram 09/2014 showed normal LV function with EF 55 to 60% and diastolic dysfunction.  He was admitted this past February with junctional bradycardia, chest pain and shortness of breath.  Her heart rate was in the 40s.  Chest pressure was felt to be due to symptomatic junctional bradycardia.  Her clonidine and metoprolol were stopped.  2D echocardiogram at that time showed EF 55 to 60%.  Amlodipine was added for blood pressure control.  She was seen back in the office on 06/23/2017 her heart rate was 68 bpm.  Her weakness resolved.  She is here today for followup and is doing well.  She denies any chest pain or pressure, SOB, DOE, PND, orthopnea, LE edema, dizziness, palpitations or syncope. She is compliant with her meds and is tolerating meds with no SE.     Past Medical History:  Diagnosis Date  . Acid reflux   . Anxiety   . Arthritis   . Back spasm   . Bradycardia    a. junctional and sinus brady during admission 06/2017 requiring cessation of beta blocker and clonidine.  . Chronic diastolic heart failure ([facility]) 03/20/2015  . Depression   . Diabetes mellitus without complication ([facility])    diet controled  . Dietary noncompliance   . Edema extremities   . Frequency of urination   . Fuch's endothelial dystrophy      bilateral eyes  . Headache    occ  . HOH (hard of hearing)    wears hearing aid in left ear  . Hypercholesteremia   . Hypertensive heart disease with CHF ([facility]) 09/11/2014  . Insomnia   . Normal coronary arteries 2010   cath with no obstructive epicardial disease.  Done for CP which was felt to be due to microvascular angina or diastolic HF  . OSA on CPAP    Followed by Dr. Fannie Knee    Past Surgical History:  Procedure Laterality Date  . ABDOMINAL HYSTERECTOMY    . APPENDECTOMY    . CARDIAC CATHETERIZATION     no CAD  . CESAREAN SECTION     x 2  . CHOLECYSTECTOMY    . COLONOSCOPY    . EYE SURGERY Right    implant  . ROTATOR CUFF REPAIR Left   . SHOULDER ARTHROSCOPY WITH SUBACROMIAL DECOMPRESSION AND BICEP TENDON REPAIR Left 08/24/2012   Procedure: SHOULDER ARTHROSCOPY WITH SUBACROMIAL DECOMPRESSION AND BICEP TENDON REPAIR;  Surgeon: Cammy Copa, MD;  Location: [facility] OR;  Service: Orthopedics;  Laterality: Left;  Left Shoulder Arthroscopy, Debridement, Biceps Tenotomy  . THYROIDECTOMY N/A 06/12/2015   Procedure: TOTAL THYROIDECTOMY;  Surgeon: Darnell Level, MD;  Location: [facility] OR;  Service: General;  Laterality: N/A;    Current Medications: Current  Meds  Medication Sig  . amLODipine (NORVASC) 10 MG tablet Take 1 tablet (10 mg total) by mouth daily.  Marland Kitchen aspirin 81 MG chewable tablet Chew 81 mg by mouth daily.  . Cholecalciferol (VITAMIN D3) 5000 units CAPS Take 5,000 Units by mouth daily.  . fenofibrate micronized (LOFIBRA) 200 MG capsule Take 200 mg by mouth daily before breakfast.  . furosemide (LASIX) 20 MG tablet TAKE 1 TABLET BY MOUTH ONCE DAILY  . hydrALAZINE (APRESOLINE) 10 MG tablet Take 1 tablet (10 mg total) by mouth every 6 (six) hours as needed (systolic (top) blood pressure greater than 170).  Marland Kitchen levothyroxine (SYNTHROID, LEVOTHROID) 137 MCG tablet TAKE 1 TABLET BY MOUTH BEFORE BREAKFAST  . Lido-Capsaicin-Men-Methyl Sal (SOOTHEE EX) Apply 1 drop topically daily.  Eye drops  . nitroGLYCERIN (NITROSTAT) 0.4 MG SL tablet Place 1 tablet (0.4 mg total) under the tongue every 5 (five) minutes as needed for chest pain.  Marland Kitchen PARoxetine (PAXIL) 20 MG tablet   . traMADol (ULTRAM) 50 MG tablet Take 1-2 tablets (50-100 mg total) by mouth every 6 (six) hours as needed for moderate pain or severe pain.  Marland Kitchen zolpidem (AMBIEN) 10 MG tablet Take 5 mg by mouth at bedtime. For sleep     Allergies:   Percocet [oxycodone-acetaminophen]; Codeine; Other; Phenergan [promethazine hcl]; Clonidine derivatives; and Metoprolol   Social History   Socioeconomic History  . Marital status: Married    Spouse name: Not on file  . Number of children: 2  . Years of education: Not on file  . Highest education level: Not on file  Occupational History  . Occupation: Radiographer, therapeutic  Social Needs  . Financial resource strain: Not on file  . Food insecurity:    Worry: Not on file    Inability: Not on file  . Transportation needs:    Medical: Not on file    Non-medical: Not on file  Tobacco Use  . Smoking status: Never Smoker  . Smokeless tobacco: Never Used  Substance and Sexual Activity  . Alcohol use: No    Alcohol/week: 0.0 oz  . Drug use: No  . Sexual activity: Not on file  Lifestyle  . Physical activity:    Days per week: Not on file    Minutes per session: Not on file  . Stress: Not on file  Relationships  . Social connections:    Talks on phone: Not on file    Gets together: Not on file    Attends religious service: Not on file    Active member of club or organization: Not on file    Attends meetings of clubs or organizations: Not on file    Relationship status: Not on file  Other Topics Concern  . Not on file  Social History Narrative  . Not on file     Family History: The patient's family history includes Cancer in her sister; Stomach cancer in her mother.  ROS:   Please see the history of present illness.    ROS  All other systems reviewed  and negative.   EKGs/Labs/Other Studies Reviewed:    The following studies were reviewed today: Hospital notes  EKG:  EKG is not ordered today.   Recent Labs: 06/12/2017: Hemoglobin 12.6; Magnesium 2.1; Platelets 207 06/13/2017: BUN 22; Creatinine, Ser 1.19; Potassium 3.8; Sodium 139 09/24/2017: TSH 1.74   Recent Lipid Panel    Component Value Date/Time   CHOL 152 10/02/2015 0901   TRIG 169 (H) 10/02/2015 0901   HDL  30 (L) 10/02/2015 0901   CHOLHDL 5.1 (H) 10/02/2015 0901   VLDL 34 (H) 10/02/2015 0901   LDLCALC 88 10/02/2015 0901    Physical Exam:    VS:  BP (!) 148/72   Pulse 61   Ht 4' 11.5" (1.511 m)   Wt 177 lb (80.3 kg)   SpO2 98%   BMI 35.15 kg/m     Wt Readings from Last 3 Encounters:  09/25/17 177 lb (80.3 kg)  09/24/17 176 lb (79.8 kg)  06/25/17 179 lb 6.4 oz (81.4 kg)     GEN:  Well nourished, well developed in no acute distress HEENT: Normal NECK: No JVD; No carotid bruits LYMPHATICS: No lymphadenopathy CARDIAC: RRR, no murmurs, rubs, gallops RESPIRATORY:  Clear to auscultation without rales, wheezing or rhonchi  ABDOMEN: Soft, non-tender, non-distended MUSCULOSKELETAL:  No edema; No deformity  SKIN: Warm and dry NEUROLOGIC:  Alert and oriented x 3 PSYCHIATRIC:  Normal affect   ASSESSMENT:    1. Chronic diastolic heart failure (HCC)   2. Essential hypertension   3. Junctional bradycardia    PLAN:    In order of problems listed above:  1.  Chronic diastolic heart failure -she appears euvolemic on exam today.  Her weight is stable.  She will continue on Lasix 20 mg daily.  2.  Hypertension - BP is fairly well controlled on exam today.  She will continue on amlodipine  daily.    3.  Junctional bradycardia - resolved after stopping BB.     Medication Adjustments/Labs and Tests Ordered: Current medicines are reviewed at length with the patient today.  Concerns regarding medicines are outlined above.  No orders of the defined types were  placed in this encounter.  No orders of the defined types were placed in this encounter.   Signed, Armanda Magic, MD  09/25/2017 3:11 PM    [facility]

## 2017-11-30 ENCOUNTER — Other Ambulatory Visit: Payer: Self-pay | Admitting: Internal Medicine

## 2018-02-16 ENCOUNTER — Other Ambulatory Visit: Payer: Self-pay | Admitting: Cardiology

## 2018-03-01 ENCOUNTER — Other Ambulatory Visit: Payer: Self-pay | Admitting: Internal Medicine

## 2018-03-29 ENCOUNTER — Ambulatory Visit: Payer: Medicare Other | Admitting: Internal Medicine

## 2018-05-20 ENCOUNTER — Ambulatory Visit: Payer: Medicare Other | Admitting: Cardiology

## 2018-06-08 ENCOUNTER — Other Ambulatory Visit: Payer: Self-pay | Admitting: Internal Medicine

## 2018-06-14 ENCOUNTER — Encounter: Payer: Self-pay | Admitting: Internal Medicine

## 2018-06-14 ENCOUNTER — Ambulatory Visit (INDEPENDENT_AMBULATORY_CARE_PROVIDER_SITE_OTHER): Payer: Medicare Other | Admitting: Internal Medicine

## 2018-06-14 VITALS — BP 170/80 | HR 78 | Ht 59.5 in | Wt 182.0 lb

## 2018-06-14 DIAGNOSIS — Z9889 Other specified postprocedural states: Secondary | ICD-10-CM

## 2018-06-14 DIAGNOSIS — E559 Vitamin D deficiency, unspecified: Secondary | ICD-10-CM

## 2018-06-14 DIAGNOSIS — E890 Postprocedural hypothyroidism: Secondary | ICD-10-CM

## 2018-06-14 NOTE — Patient Instructions (Signed)
Please stop at the lab.  Please continue vitamin D 5000 mcg daily.  Please continue Levothyroxine 137 mcg.  Take the thyroid hormone every day, with water, at least 30 minutes before breakfast, separated by at least 4 hours from: - acid reflux medications - calcium - iron - multivitamins  Please come back for a follow-up appointment in 6 months.

## 2018-06-14 NOTE — Progress Notes (Signed)
Patient ID: Kimberly [name], female   DOB: [birth year], 78 y.o.   MRN: [id number]   HPI  Kimberly [name] is a 78 y.o.-year-old female, returning for f/u for hypercalcemia, dx 2012, postsurgical hypothyroidism. Last OV 8 months ago.  She had to miss 2 appointments before because she did not have transportation.  In 06/2017, patient was hospitalized and was found to have junctional bradycardia and a TSH of 42.  At that time, per her daughter's report, she had compliance issues with levothyroxine.  At last visit, her TFTs finally normalized.  She now puts the bottle of levothyroxine on her nightstand.  S/p Thyroidectomy: She had thyroidectomy for substernal goiter in 05/2015: Benign, nodular pathology  Postsurgical hypothyroidism:  Pt is on levothyroxine 137 Mcg daily, taken: - in am - fasting - at least 30 min from b'fast - stopped Calcium (caused nausea) - stopped Fe (caused nausea), MVI, PPIs - not on Biotin  TFTs finally normalized at last check Lab Results  Component Value Date   TSH 1.74 09/24/2017   TSH 16.43 (H) 06/25/2017   TSH 42.727 (H) 06/12/2017   TSH 41.60 (H) 01/16/2016   TSH 31.80 (H) 09/18/2015   TSH 1.128 08/25/2014   FREET4 1.19 09/24/2017   FREET4 1.06 06/25/2017   FREET4 0.84 01/16/2016   FREET4 0.83 09/18/2015  07/31/2015: TSH 58  Surprisingly, calcium normalized after her thyroid surgery: Lab Results  Component Value Date   CALCIUM 9.7 06/13/2017   PHOS 3.2 12/09/2013   Primary hyperparathyroidism: -Greatly improved/resolved  Reviewed patient's pertinent labs: Lab Results  Component Value Date   PTH 19 09/18/2015   PTH Comment 09/18/2015   PTH 21 02/08/2015   PTH Comment 02/08/2015   PTH 17 12/09/2013   PTH Comment 12/09/2013   PTH 14.9 08/24/2012   CALCIUM 9.7 06/13/2017   CALCIUM 10.3 06/12/2017   CALCIUM 9.9 01/13/2017   CALCIUM 10.2 01/23/2016   CALCIUM 10.2 12/20/2015   CALCIUM 9.4 12/06/2015   CALCIUM 10.6 (H) 11/22/2015   CALCIUM  9.6 10/02/2015   CALCIUM CANCELED 09/18/2015   CALCIUM 9.8 06/13/2015  12/25/2014: Calcium 10.8 (8.6-10.3) 09/12/2013: Ca 10.8, iCa 5.7 (4.5-5.6)  She has a history of vitamin D deficiency, but latest level was normal: Lab Results  Component Value Date   VD25OH 61.83 09/24/2017   VD25OH 32.67 09/18/2015   VD25OH 28.65 (L) 02/08/2015   VD25OH 50.59 12/09/2013   She continues 5000 units vitamin D daily.  Reviewed other pertinent labs: Component     Latest Ref Rng 02/08/2015 02/08/2015         2:14 PM  2:14 PM  Vitamin A (Retinoic Acid)     38 - 98 mcg/dL 89    Component     Latest Ref Rng 12/09/2013  Vitamin D 1, 25 (OH) Total     18 - 72 pg/mL 79 (H)  Vitamin D3 1, 25 (OH)      79  Vitamin D2 1, 25 (OH)      <8  PTH-Related Protein (PTH-RP)     14 - 27 pg/mL 16  Phosphorus     2.3 - 4.6 mg/dL 3.2   Her urine calcium was high: Component     Latest Ref Rng 01/20/2014  Calcium, Ur      14  Calcium, 24 hour urine     100 - 250 mg/day 322 (H)  Creatinine, Urine      51.9  Creatinine, 24H Ur     700 - 1800  mg/day 1194   No history of osteoporosis.  No fractures. Occasional falls. No kidney stones.    I reviewed pt's previous DXA scan -2008:  LUMBAR SPINE (L1-L3)  BMD: 1.273  T-score (% of young adult value): 2.3  Z-score (% adult age match value): 4.1  LEFT HIP (NECK)  BMD: 0.831  T-score (% of young adult value): -0.2  Z-score (% adult age match value): 1.4  Assessment: This patient is considered normal according to [facility]) criteria.   + CKD. Last BUN/Cr reviewed: Lab Results  Component Value Date   BUN 22 (H) 06/13/2017   CREATININE 1.19 (H) 06/13/2017    She has had a technetium sestamibi scan (04/2015) that was negative for a parathyroid adenoma e and a CT scan of her neck (04/2015) there was also negative for possible parathyroid nodule nodule but this showed a substernal goiter with tracheal narrowing.  She also has a history  of HTN; HL; DM2; CKD - sees Dr Clover Mealy.  Her husband has Pr cancer >> he went through RxTx recently. E also had hear valve sx. She does not drive.  ROS: Constitutional: no weight gain/no weight loss, no fatigue, no subjective hyperthermia, no subjective hypothermia Eyes: no blurry vision, no xerophthalmia ENT: no sore throat, no nodules palpated in neck, no dysphagia, no odynophagia, no hoarseness Cardiovascular: no CP/no SOB/no palpitations/no leg swelling Respiratory: no cough/no SOB/no wheezing Gastrointestinal: no N/no V/no D/no C/no acid reflux Musculoskeletal: no muscle aches/no joint aches Skin: no rashes, no hair loss Neurological: no tremors/no numbness/no tingling/no dizziness  I reviewed pt's medications, allergies, PMH, social hx, family hx, and changes were documented in the history of present illness. Otherwise, unchanged from my initial visit note.  Past Medical History:  Diagnosis Date  . Acid reflux   . Anxiety   . Arthritis   . Back spasm   . Bradycardia    a. junctional and sinus brady during admission 06/2017 requiring cessation of beta blocker and clonidine.  . Chronic diastolic heart failure ([place]) 03/20/2015  . Depression   . Diabetes mellitus without complication ([place])    diet controled  . Dietary noncompliance   . Edema extremities   . Frequency of urination   . Fuch's endothelial dystrophy    bilateral eyes  . Headache    occ  . HOH (hard of hearing)    wears hearing aid in left ear  . Hypercholesteremia   . Hypertensive heart disease with CHF ([place]) 09/11/2014  . Insomnia   . Normal coronary arteries 2010   cath with no obstructive epicardial disease.  Done for CP which was felt to be due to microvascular angina or diastolic HF  . OSA on CPAP    Followed by Dr. Keturah Barre   Past Surgical History:  Procedure Laterality Date  . ABDOMINAL HYSTERECTOMY    . APPENDECTOMY    . CARDIAC CATHETERIZATION     no CAD  . CESAREAN SECTION     x 2  .  CHOLECYSTECTOMY    . COLONOSCOPY    . EYE SURGERY Right    implant  . ROTATOR CUFF REPAIR Left   . SHOULDER ARTHROSCOPY WITH SUBACROMIAL DECOMPRESSION AND BICEP TENDON REPAIR Left 08/24/2012   Procedure: SHOULDER ARTHROSCOPY WITH SUBACROMIAL DECOMPRESSION AND BICEP TENDON REPAIR;  Surgeon: Meredith Pel, MD;  Location: [facility];  Service: Orthopedics;  Laterality: Left;  Left Shoulder Arthroscopy, Debridement, Biceps Tenotomy  . THYROIDECTOMY N/A 06/12/2015   Procedure: TOTAL THYROIDECTOMY;  Surgeon: Armandina Gemma, MD;  Location: [place];  Service: General;  Laterality: N/A;   History   Social History  . Marital Status: Married    Spouse Name: N/A    Number of Children: 2   Occupational History  . retired-assistant teacher    Social History Main Topics  . Smoking status: Never Smoker   . Smokeless tobacco: Not on file  . Alcohol Use: No  . Drug Use: No   Current Outpatient Medications on File Prior to Visit  Medication Sig Dispense Refill  . amLODipine (NORVASC) 10 MG tablet Take 1 tablet (10 mg total) by mouth daily. 30 tablet 3  . aspirin 81 MG chewable tablet Chew 81 mg by mouth daily.    . Cholecalciferol (VITAMIN D3) 5000 units CAPS Take 5,000 Units by mouth daily.    . fenofibrate micronized (LOFIBRA) 200 MG capsule Take 200 mg by mouth daily before breakfast.    . furosemide (LASIX) 20 MG tablet TAKE 1 TABLET BY MOUTH ONCE DAILY 90 tablet 2  . hydrALAZINE (APRESOLINE) 10 MG tablet Take 1 tablet (10 mg total) by mouth every 6 (six) hours as needed (systolic (top) blood pressure greater than 170). 60 tablet 1  . levothyroxine (SYNTHROID, LEVOTHROID) 137 MCG tablet TAKE 1 TABLET BY MOUTH ONCE DAILY BEFORE BREAKFAST 90 tablet 0  . Lido-Capsaicin-Men-Methyl Sal (SOOTHEE EX) Apply 1 drop topically daily. Eye drops    . nitroGLYCERIN (NITROSTAT) 0.4 MG SL tablet Place 1 tablet (0.4 mg total) under the tongue every 5 (five) minutes as needed for chest pain. 25 tablet 4  . PARoxetine  (PAXIL) 20 MG tablet   0  . zolpidem (AMBIEN) 10 MG tablet Take 5 mg by mouth at bedtime. For sleep    . clonazePAM (KLONOPIN) 1 MG tablet TAKE 1 4 TO 1 TABLET BY MOUTH EVERY 12 HOURS AS NEEDED FOR ANXIETY    . traMADol (ULTRAM) 50 MG tablet Take 1-2 tablets (50-100 mg total) by mouth every 6 (six) hours as needed for moderate pain or severe pain. (Patient not taking: Reported on 06/14/2018) 15 tablet 0   No current facility-administered medications on file prior to visit.    Allergies  Allergen Reactions  . Percocet [Oxycodone-Acetaminophen] Shortness Of Breath and Other (See Comments)    Pt couldn't breathe or catch her breath  . Codeine Other (See Comments)    hyperactivity  . Other Other (See Comments)    Some pain medication given at [facility] for surgery caused hallucinations   . Phenergan [Promethazine Hcl] Other (See Comments)    Hallucinations   . Clonidine Derivatives     Bradycardia (06/2017)  . Metoprolol     Bradycardia 06/2017    Family History  Problem Relation Age of Onset  . Stomach cancer Mother   . Cancer Sister        intestinal   PE: BP (!) 170/80   Pulse 78   Ht 4' 11.5" (1.511 m)   Wt 182 lb (82.6 kg)   SpO2 97%   BMI 36.14 kg/m  Body mass index is 36.14 kg/m. Wt Readings from Last 3 Encounters:  06/14/18 182 lb (82.6 kg)  09/25/17 177 lb (80.3 kg)  09/24/17 176 lb (79.8 kg)   Constitutional: overweight, in NAD Eyes: PERRLA, EOMI, no exophthalmos ENT: moist mucous membranes, no neck masses, thyroidectomy scar healed, no cervical lymphadenopathy Cardiovascular: RRR, No MRG Respiratory: CTA B Gastrointestinal: abdomen soft, NT, ND, BS+ Musculoskeletal: no deformities, strength intact in  all 4 Skin: moist, warm, no rashes Neurological: no tremor with outstretched hands, DTR normal in all 4  Assessment: 1. S/p thyroidectomy  2. Postsurgical hypothyroidism  3. Hypercalcemia - ? primary hyperparathyroidism (PTH not  increased...)  Plan: 1. S/p thyroidectomy -Thyroidectomy scar healed -No numbness, dysesthesia, keloid at this site of the surgery  2. Postsurgical hypothyroidism - latest thyroid labs reviewed with pt >> normal 09/2017 - she continues on LT4 137 Mcg daily - pt feels good on this dose. - we discussed about taking the thyroid hormone every day, with water, >30 minutes before breakfast, separated by >4 hours from acid reflux medications, calcium, iron, multivitamins. Pt. is taking it correctly.  She puts it on the nightstand and she believes that she did not miss any doses since last visit. - will check thyroid tests today: TSH and fT4 - If labs are abnormal, she will need to return for repeat TFTs in 1.5 months  3. Hypercalcemia -Calcium levels remain normal after her thyroidectomy -Reviewed previous investigation: Patient has had several instances of elevated calcium, with the highest being at 11.1.  Of note, her PTH levels were repeatedly checked and they were normal.  A PTHrp level was normal, multiple myeloma work-up was negative, vitamin A level was normal, vitamin D was normal or minimally low, she had a high 1, 25 dihydroxy vitamin D with a normal phosphorus.  Her urinary calcium was high in the past.  A technetium sestamibi scan, thyroid ultrasound, and neck CT did not show any parathyroid masses. -She has no apparent complications from hypercalcemia.  No history of nephrolithiasis, abdominal pain, bone pain -Latest calcium level was normal 1 year ago and vitamin D level was also normal, at 61.83 in 09/2017 -We will recheck her calcium today  Component     Latest Ref Rng & Units 06/14/2018  Calcium     8.4 - 10.5 mg/dL 10.2  VITD     30.00 - 100.00 ng/mL 57.47  TSH     0.35 - 4.50 uIU/mL 3.93  T4,Free(Direct)     0.60 - 1.60 ng/dL 0.98   All labs normal.  Philemon Kingdom, MD PhD [facility]

## 2018-06-15 ENCOUNTER — Telehealth: Payer: Self-pay

## 2018-06-15 LAB — VITAMIN D 25 HYDROXY (VIT D DEFICIENCY, FRACTURES): VITD: 57.47 ng/mL (ref 30.00–100.00)

## 2018-06-15 LAB — T4, FREE: Free T4: 0.98 ng/dL (ref 0.60–1.60)

## 2018-06-15 LAB — CALCIUM: CALCIUM: 10.2 mg/dL (ref 8.4–10.5)

## 2018-06-15 LAB — TSH: TSH: 3.93 u[IU]/mL (ref 0.35–4.50)

## 2018-06-15 NOTE — Telephone Encounter (Signed)
-----   Message from Kimberly Pavlov, MD sent at 06/15/2018 12:20 PM EST ----- Efraim Kaufmann, can you please call pt: all labs are normal!

## 2018-06-16 ENCOUNTER — Ambulatory Visit: Payer: Medicare Other | Admitting: Cardiology

## 2018-06-16 ENCOUNTER — Encounter: Payer: Self-pay | Admitting: Cardiology

## 2018-06-16 VITALS — BP 156/76 | HR 82 | Ht 59.5 in | Wt 185.8 lb

## 2018-06-16 DIAGNOSIS — I5032 Chronic diastolic (congestive) heart failure: Secondary | ICD-10-CM | POA: Diagnosis not present

## 2018-06-16 DIAGNOSIS — I10 Essential (primary) hypertension: Secondary | ICD-10-CM

## 2018-06-16 DIAGNOSIS — R001 Bradycardia, unspecified: Secondary | ICD-10-CM | POA: Diagnosis not present

## 2018-06-16 MED ORDER — HYDRALAZINE HCL 10 MG PO TABS: 10.0000 mg | ORAL_TABLET | Freq: Three times a day (TID) | ORAL | 3 refills | Status: DC

## 2018-06-16 MED ORDER — LOSARTAN POTASSIUM 25 MG PO TABS: 25.0000 mg | ORAL_TABLET | Freq: Every day | ORAL | 3 refills | Status: DC

## 2018-06-16 NOTE — Telephone Encounter (Signed)
Notified patient of message from Dr. Gherghe, patient expressed understanding and agreement. No further questions.  

## 2018-06-16 NOTE — Patient Instructions (Addendum)
Medication Instructions:  STOP AMLODIPINE  START LOSARTAN 25 mg DAILY START HYDRALAZINE 10 mg 3 TIMES DAILY  If you need a refill on your cardiac medications before your next appointment, please call your pharmacy.   Lab work:TODAY BMET If you have labs (blood work) drawn today and your tests are completely normal, you will receive your results only by: Marland Kitchen. MyChart Message (if you have MyChart) OR . A paper copy in the mail If you have any lab test that is abnormal or we need to change your treatment, we will call you to review the results.  Testing/Procedures: NONE  Follow-Up:2 WEEKS IN THE HTN CLINIC BP CHECK                    [facility]KS [facility] At [facility], you and your health needs are our priority.  As part of our continuing mission to provide you with exceptional heart care, we have created designated Provider Care Teams.  These Care Teams include your primary Cardiologist (physician) and Advanced Practice Providers (APPs -  Physician Assistants and Nurse Practitioners) who all work together to provide you with the care you need, when you need it. You will need a follow up appointment in 6 months.  Please call our office 2 months in advance to schedule this appointment.  You may see Armanda Magicraci Turner, MD or one of the following Advanced Practice Providers on your designated Care Team:   [place]burgBrittainy Simmons, PA-C Ronie Spiesayna Dunn, PA-C . Jacolyn ReedyMichele Lenze, PA-C  Any Other Special Instructions Will Be Listed Below (If Applicable).

## 2018-06-16 NOTE — Progress Notes (Signed)
06/16/2018 Kimberly PlanBonnie C [name]   01/23/1941  [id number]  Primary Physician Rankins, Fanny DanceVictoria R, MD Primary Cardiologist: Armanda Magicraci Turner, MD  Electrophysiologist: None   Reason for Visit/CC: F/u for HTN and chronic diastolic HF  HPI:  Kimberly [name] is a 78 y.o. female with a hx of chronic diastolic CHF, chronic lower extremity edema and hypertension.  She had a cath in 2010 due to CP showing normal coronary arteries with elevated LVEDP felt to be due to diastolic CHF vs. Microvascular angina. Her CP resolved for a while but reoccurred and nuclear stress test 09/2014 was normal.  2D echocardiogram 09/2014 showed normal LV function with EF 55 to 60% and diastolic dysfunction.  She was admitted  February 2019 with junctional bradycardia, chest pain and shortness of breath.  Her heart rate was in the 40s.  Chest pressure was felt to be due to symptomatic junctional bradycardia.  Her clonidine and metoprolol were stopped.  2D echocardiogram at that time showed EF 55 to 60%.  Amlodipine was added for blood pressure control.  She was seen back in the office on 06/23/2017 and her heart rate was 68 bpm.  Her weakness resolved. Last seen by Dr. Mayford Knifeurner 09/2017 and was doing well. She was instructed to f/u in 6 months.   Patient reports that she has been under a lot of stress recently caring for her husband who has had multiple medical issues over the past 2 years.  From a cardiac standpoint however she has been doing fairly well.  She denies any chest pain.  No dyspnea.  Also denies palpitations, dizziness, syncope/near syncope.  She does complain of bilateral lower extremity edema which tends to be worse if she stands long periods at a time.  She is on amlodipine for blood pressure.  Her blood pressure is elevated in clinic today at 156/76.  She reports that she was seen by another provider earlier this week and had a systolic blood pressure in the 180s.  She often checks her blood pressure at home and systolic pressures  are often in the 140s to 150s.  In addition to amlodipine she is also on Lasix 20 mg once daily.  She also has hydralazine prescribed at 10 mg 3 times daily as needed for systolic blood pressure greater than 170.    Cardiac Studies   Current Meds  Medication Sig  . amLODipine (NORVASC) 10 MG tablet Take 1 tablet (10 mg total) by mouth daily.  Marland Kitchen. aspirin 81 MG chewable tablet Chew 81 mg by mouth daily.  . Cholecalciferol (VITAMIN D3) 5000 units CAPS Take 5,000 Units by mouth daily.  . clonazePAM (KLONOPIN) 1 MG tablet TAKE 1 4 TO 1 TABLET BY MOUTH EVERY 12 HOURS AS NEEDED FOR ANXIETY  . fenofibrate micronized (LOFIBRA) 200 MG capsule Take 200 mg by mouth daily before breakfast.  . furosemide (LASIX) 20 MG tablet TAKE 1 TABLET BY MOUTH ONCE DAILY  . levothyroxine (SYNTHROID, LEVOTHROID) 137 MCG tablet TAKE 1 TABLET BY MOUTH ONCE DAILY BEFORE BREAKFAST  . Lido-Capsaicin-Men-Methyl Sal (SOOTHEE EX) Apply 1 drop topically daily. Eye drops  . nitroGLYCERIN (NITROSTAT) 0.4 MG SL tablet Place 1 tablet (0.4 mg total) under the tongue every 5 (five) minutes as needed for chest pain.  Marland Kitchen. PARoxetine (PAXIL) 20 MG tablet Take 60 mg by mouth daily.   . traMADol (ULTRAM) 50 MG tablet Take 1-2 tablets (50-100 mg total) by mouth every 6 (six) hours as needed for moderate pain or severe pain.  .Marland Kitchen  zolpidem (AMBIEN) 10 MG tablet Take 5 mg by mouth at bedtime. For sleep   Allergies  Allergen Reactions  . Percocet [Oxycodone-Acetaminophen] Shortness Of Breath and Other (See Comments)    Pt couldn't breathe or catch her breath  . Codeine Other (See Comments)    hyperactivity  . Other Other (See Comments)    Some pain medication given at [facility] for [facility] caused hallucinations   . Phenergan [Promethazine Hcl] Other (See Comments)    Hallucinations   . Clonidine Derivatives     Bradycardia (06/2017)  . Metoprolol     Bradycardia 06/2017    Past Medical History:  Diagnosis Date  . Acid reflux     . Anxiety   . Arthritis   . Back spasm   . Bradycardia    a. junctional and sinus brady during admission 06/2017 requiring cessation of beta blocker and clonidine.  . Chronic diastolic heart failure ([facility]) 03/20/2015  . Depression   . Diabetes mellitus without complication ([facility])    diet controled  . Dietary noncompliance   . Edema extremities   . Frequency of urination   . Fuch's endothelial dystrophy    bilateral eyes  . Headache    occ  . HOH (hard of hearing)    wears hearing aid in left ear  . Hypercholesteremia   . Hypertensive heart disease with CHF ([facility]) 09/11/2014  . Insomnia   . Normal coronary arteries 2010   cath with no obstructive epicardial disease.  Done for CP which was felt to be due to microvascular angina or diastolic HF  . OSA on CPAP    Followed by Dr. Fannie Knee   Family History  Problem Relation Age of Onset  . Stomach cancer Mother   . Cancer Sister        intestinal   Past Surgical History:  Procedure Laterality Date  . ABDOMINAL HYSTERECTOMY    . APPENDECTOMY    . CARDIAC CATHETERIZATION     no CAD  . CESAREAN SECTION     x 2  . CHOLECYSTECTOMY    . COLONOSCOPY    . EYE SURGERY Right    implant  . ROTATOR CUFF REPAIR Left   . SHOULDER ARTHROSCOPY WITH SUBACROMIAL DECOMPRESSION AND BICEP TENDON REPAIR Left 08/24/2012   Procedure: SHOULDER ARTHROSCOPY WITH SUBACROMIAL DECOMPRESSION AND BICEP TENDON REPAIR;  Surgeon: Cammy Copa, MD;  Location: [facility] OR;  Service: Orthopedics;  Laterality: Left;  Left Shoulder Arthroscopy, Debridement, Biceps Tenotomy  . THYROIDECTOMY N/A 06/12/2015   Procedure: TOTAL THYROIDECTOMY;  Surgeon: Darnell Level, MD;  Location: [facility] OR;  Service: General;  Laterality: N/A;   Social History   Socioeconomic History  . Marital status: Married    Spouse name: Not on file  . Number of children: 2  . Years of education: Not on file  . Highest education level: Not on file  Occupational History  . Occupation:  Radiographer, therapeutic  Social Needs  . Financial resource strain: Not on file  . Food insecurity:    Worry: Not on file    Inability: Not on file  . Transportation needs:    Medical: Not on file    Non-medical: Not on file  Tobacco Use  . Smoking status: Never Smoker  . Smokeless tobacco: Never Used  Substance and Sexual Activity  . Alcohol use: No    Alcohol/week: 0.0 standard drinks  . Drug use: No  . Sexual activity: Not on file  Lifestyle  .  Physical activity:    Days per week: Not on file    Minutes per session: Not on file  . Stress: Not on file  Relationships  . Social connections:    Talks on phone: Not on file    Gets together: Not on file    Attends religious service: Not on file    Active member of club or organization: Not on file    Attends meetings of clubs or organizations: Not on file    Relationship status: Not on file  . Intimate partner violence:    Fear of current or ex partner: Not on file    Emotionally abused: Not on file    Physically abused: Not on file    Forced sexual activity: Not on file  Other Topics Concern  . Not on file  Social History Narrative  . Not on file     Review of Systems: General: negative for chills, fever, night sweats or weight changes.  Cardiovascular: negative for chest pain, dyspnea on exertion, edema, orthopnea, palpitations, paroxysmal nocturnal dyspnea or shortness of breath Dermatological: negative for rash Respiratory: negative for cough or wheezing Urologic: negative for hematuria Abdominal: negative for nausea, vomiting, diarrhea, bright red blood per rectum, melena, or hematemesis Neurologic: negative for visual changes, syncope, or dizziness All other systems reviewed and are otherwise negative except as noted above.   Physical Exam:  Blood pressure (!) 156/76, pulse 82, height 4' 11.5" (1.511 m), weight 185 lb 12.8 oz (84.3 kg), SpO2 96 %.  General appearance: alert, cooperative and no  distress Neck: no carotid bruit and no JVD Lungs: clear to auscultation bilaterally Heart: regular rate and rhythm, S1, S2 normal, no murmur, click, rub or gallop Extremities: bilateral LEE Pulses: 2+ and symmetric Skin: Skin color, texture, turgor normal. No rashes or lesions Neurologic: Grossly normal  EKG NSR w/ LVH 80 bpm -- personally reviewed   ASSESSMENT AND PLAN:   1.  Hypertension: Blood pressure is not controlled.  Per patient report it was also elevated at another provider's office recently and has been elevated at home.  We are stopping amlodipine due to issues with bilateral lower extremity edema. Unable to use  blockers due to bradycardia.  We will check basic metabolic panel today and will plan to start losartan 25 mg daily, in place of amlodipine.  Also advised that instead of taking as needed, she start taking hydralazine 10 mg 3 times daily.  We will arrange follow-up in hypertension clinic in 2 to 3 weeks.  Follow-up BMP in 7 to 10 days after starting losartan.  Goal blood pressures less than 130/80.  2.  Chronic diastolic heart failure: She has chronic bilateral lower extremity edema but no dyspnea orthopnea or PND.  Lung exam is normal.  Continue Lasix.  We discussed low-salt diet.  She is not on a beta-blocker due to history of junctional bradycardia.  3.  Chronic lower extremity edema: Patient complaining of her chronic edema. Patient does have underlying diastolic dysfunction but denies any associated dyspnea, orthopnea or PND.  I believe her lower extremity edema is likely secondary to amlodipine and chronic venous insufficiency.  We will stop amlodipine and will prescribe a different agent for blood pressure control.  4.  Obstructive sleep apnea: Reports full compliance with CPAP.  This is followed by Dr. Maple Hudson.  5. H/o Junctional Bradycardia: Was symptomatic. Resolved after discontinuation of clonidine and metoprolol. Resting HR in the low 80s.   Follow-Up: HTN  clinic in 2-3  weeks. Dr. Mayford Knifeurner in 6 months.    Delmer IslamSimmons PA-C, [facility] 06/16/2018 3:43 PM

## 2018-06-17 LAB — BASIC METABOLIC PANEL
BUN / CREAT RATIO: 24 (ref 12–28)
BUN: 24 mg/dL (ref 8–27)
CO2: 23 mmol/L (ref 20–29)
Calcium: 9.9 mg/dL (ref 8.7–10.3)
Chloride: 101 mmol/L (ref 96–106)
Creatinine, Ser: 0.98 mg/dL (ref 0.57–1.00)
GFR calc Af Amer: 64 mL/min/{1.73_m2} (ref >59)
GFR calc non Af Amer: 56 mL/min/{1.73_m2} — ABNORMAL LOW (ref >59)
GLUCOSE: 112 mg/dL — AB (ref 65–99)
Potassium: 4 mmol/L (ref 3.5–5.2)
Sodium: 140 mmol/L (ref 134–144)

## 2018-06-21 ENCOUNTER — Telehealth: Payer: Self-pay

## 2018-06-21 NOTE — Telephone Encounter (Signed)
Spoke with Kimberly [name] and she said that 2/24 is fine for the patient to come in for repeat BMET and HTN clinic.  I then called the patient and confirmed appt.  She verbalized understanding.

## 2018-06-21 NOTE — Telephone Encounter (Signed)
-----   Message from Brittainy M Simmons, PA-C sent at 06/20/2018  4:32 PM EST ----- Baseline Renal function and K both ok. Continue with Losartan. Return for f/u BMP in 1 week to reassess kidney function and K after starting med. 

## 2018-06-21 NOTE — Telephone Encounter (Signed)
I spoke to the patient and gave her the lab results and recommendation to come back in 1 week for another BMET draw.  She mentioned that she is scheduled to come in on 2/24 for BMET draw and Pharmacist visit.  Please advise if you want her to come in next week also.  Thank you.

## 2018-06-21 NOTE — Telephone Encounter (Signed)
-----   Message from Allayne Butcher, [place] sent at 06/20/2018  4:32 PM EST ----- Baseline Renal function and K both ok. Continue with Losartan. Return for f/u BMP in 1 week to reassess kidney function and K after starting med.

## 2018-06-21 NOTE — Telephone Encounter (Signed)
Notes recorded by Sigurd Sos, RN on 06/21/2018 at 8:15 AM EST [id number] 2/10 ------

## 2018-07-05 ENCOUNTER — Other Ambulatory Visit: Payer: Medicare Other

## 2018-07-05 ENCOUNTER — Ambulatory Visit (INDEPENDENT_AMBULATORY_CARE_PROVIDER_SITE_OTHER): Payer: Medicare Other | Admitting: Pharmacist

## 2018-07-05 VITALS — BP 178/92 | HR 79

## 2018-07-05 DIAGNOSIS — I110 Hypertensive heart disease with heart failure: Secondary | ICD-10-CM

## 2018-07-05 DIAGNOSIS — I5032 Chronic diastolic (congestive) heart failure: Secondary | ICD-10-CM

## 2018-07-05 DIAGNOSIS — R001 Bradycardia, unspecified: Secondary | ICD-10-CM

## 2018-07-05 DIAGNOSIS — I10 Essential (primary) hypertension: Secondary | ICD-10-CM

## 2018-07-05 MED ORDER — LOSARTAN POTASSIUM 50 MG PO TABS: 50.0000 mg | ORAL_TABLET | Freq: Every day | ORAL | 11 refills | Status: DC

## 2018-07-05 MED ORDER — AMLODIPINE BESYLATE 5 MG PO TABS: 5.0000 mg | ORAL_TABLET | Freq: Every day | ORAL | 11 refills | Status: DC

## 2018-07-05 NOTE — Progress Notes (Signed)
Patient ID: Kimberly [name]                 DOB: [birth year]                      MRN: [id number]     HPI: Kimberly [name] is a 78 y.o. female patient of Dr Mayford Knife referred by Boyce Medici, PA to HTN clinic. PMH of chronic diastolic CHF,chronic lower extremity edema and hypertension. Last seen 06/16/2018 and amlodipine 10mg  was discontinued due to LE edema. She continued to be hypertensive 156/76 mmHg so losartan 25mg  was started and her hydralazine was changed from PRN to scheduled 10mg  TID.   She presents to HTN clinic today in good spirits. She complains of feeling very nauseated with headache and dizziness around 15-20 minutes every time after taking her hydralazine doses.  Her LE edema has improved after stopping amlodipine 10mg  and she continues to take furosemide 20mg  every day. She feels that her volume status is at baseline.  She reports an event where she felt tachycardic and SOB a few days ago when walking to the kitchen. She felt better after stepping outside. This happens to her every few months. May be anxiety related. No chest pain/pressure in recent memory.   Current HTN meds: hydralazine 10 TID, losartan 25mg  daily, furosemide 20mg  daily Previously tried: amlodipine 10mg  (stopped d/t LE edema), clonidine and metoprolol (fatigue and bradycardia to low 40s), chlorthalidone (bump in Scr)  BP goal: <140/90 given her age and intolerances to BP medications  Family History: Cancer in her sister; Stomach cancer in her mother  Diet: Eating cereal for breakfast, sandwiches for lunch and pasta for dinner with canned red sauce. Reports eating hot dogs occasionally. Does not add salt to her food. Drinks diet coke and occasionally water.  Exercise: She reports walking around the house for exercise.  Home BP readings:  She reports taking BP  before coming into clinic and it was 168/82. She does not have a BP log with her but reports this is similar to recent readings.  Wt Readings from  Last 3 Encounters:  06/16/18 185 lb 12.8 oz (84.3 kg)  06/14/18 182 lb (82.6 kg)  09/25/17 177 lb (80.3 kg)   BP Readings from Last 3 Encounters:  07/05/18 (!) 178/92  06/16/18 (!) 156/76  06/14/18 (!) 170/80   Pulse Readings from Last 3 Encounters:  07/05/18 79  06/16/18 82  06/14/18 78    Renal function: CrCl cannot be calculated (Unknown ideal weight.).  Past Medical History:  Diagnosis Date  . Acid reflux   . Anxiety   . Arthritis   . Back spasm   . Bradycardia    a. junctional and sinus brady during admission 06/2017 requiring cessation of beta blocker and clonidine.  . Chronic diastolic heart failure (HCC) 03/20/2015  . Depression   . Diabetes mellitus without complication (HCC)    diet controled  . Dietary noncompliance   . Edema extremities   . Frequency of urination   . Fuch's endothelial dystrophy    bilateral eyes  . Headache    occ  . HOH (hard of hearing)    wears hearing aid in left ear  . Hypercholesteremia   . Hypertensive heart disease with CHF (HCC) 09/11/2014  . Insomnia   . Normal coronary arteries 2010   cath with no obstructive epicardial disease.  Done for CP which was felt to be due to microvascular angina or diastolic HF  .  OSA on CPAP    Followed by Dr. Fannie Knee    Current Outpatient Medications on File Prior to Visit  Medication Sig Dispense Refill  . aspirin 81 MG chewable tablet Chew 81 mg by mouth daily.    . Cholecalciferol (VITAMIN D3) 5000 units CAPS Take 5,000 Units by mouth daily.    . clonazePAM (KLONOPIN) 1 MG tablet TAKE 1 4 TO 1 TABLET BY MOUTH EVERY 12 HOURS AS NEEDED FOR ANXIETY    . fenofibrate micronized (LOFIBRA) 200 MG capsule Take 200 mg by mouth daily before breakfast.    . furosemide (LASIX) 20 MG tablet TAKE 1 TABLET BY MOUTH ONCE DAILY 90 tablet 2  . hydrALAZINE (APRESOLINE) 10 MG tablet Take 1 tablet (10 mg total) by mouth 3 (three) times daily. 180 tablet 3  . levothyroxine (SYNTHROID, LEVOTHROID) 137 MCG  tablet TAKE 1 TABLET BY MOUTH ONCE DAILY BEFORE BREAKFAST 90 tablet 0  . Lido-Capsaicin-Men-Methyl Sal (SOOTHEE EX) Apply 1 drop topically daily. Eye drops    . losartan (COZAAR) 25 MG tablet Take 1 tablet (25 mg total) by mouth daily. 90 tablet 3  . nitroGLYCERIN (NITROSTAT) 0.4 MG SL tablet Place 1 tablet (0.4 mg total) under the tongue every 5 (five) minutes as needed for chest pain. 25 tablet 4  . PARoxetine (PAXIL) 20 MG tablet Take 60 mg by mouth daily.   0  . traMADol (ULTRAM) 50 MG tablet Take 1-2 tablets (50-100 mg total) by mouth every 6 (six) hours as needed for moderate pain or severe pain. 15 tablet 0  . zolpidem (AMBIEN) 10 MG tablet Take 5 mg by mouth at bedtime. For sleep     No current facility-administered medications on file prior to visit.     Allergies  Allergen Reactions  . Percocet [Oxycodone-Acetaminophen] Shortness Of Breath and Other (See Comments)    Pt couldn't breathe or catch her breath  . Codeine Other (See Comments)    hyperactivity  . Other Other (See Comments)    Some pain medication given at [facility] for surgery caused hallucinations   . Phenergan [Promethazine Hcl] Other (See Comments)    Hallucinations   . Clonidine Derivatives     Bradycardia (06/2017)  . Metoprolol     Bradycardia 06/2017      Assessment/Plan:  1. Hypertension -  Longstanding history of hypertension, currently uncontrolled. Clinic BP today 178/92 mmHg, with goal <140/90. Reporting adverse effects with hydralazine doses three times daily about 20 minutes after taking each dose. Will stop hydralazine today.  Previously experienced LE edema on amlodipine 10mg , now resolved. Will re-challenge with amlodipine 5mg  as she has previously tolerated this dose. Due to continued uncontrolled BP, will increase losartan to 50mg . Patient to go to lab for Bmet draw today. Will plan call her tomorrow with results and any necessary changes to losartan dosing. HTN clinic follow up scheduled  for 3 weeks.  Wendelyn Breslow, PharmD PGY1 Pharmacy Resident Phone: [phone] 07/05/2018 3:59 PM

## 2018-07-05 NOTE — Patient Instructions (Addendum)
It was nice to see you today!  STOP taking hydralazine  START taking amlodipine 5mg  daily  START taking losartan 50mg  (You can take 2 tablets of losartan 25mg  for now)  Follow up for blood pressure check on March 16th at 3:30pm

## 2018-07-06 ENCOUNTER — Telehealth: Payer: Self-pay

## 2018-07-06 LAB — BASIC METABOLIC PANEL
BUN/Creatinine Ratio: 31 — ABNORMAL HIGH (ref 12–28)
BUN: 27 mg/dL (ref 8–27)
CO2: 22 mmol/L (ref 20–29)
Calcium: 10.2 mg/dL (ref 8.7–10.3)
Chloride: 100 mmol/L (ref 96–106)
Creatinine, Ser: 0.88 mg/dL (ref 0.57–1.00)
GFR calc Af Amer: 73 mL/min/{1.73_m2} (ref >59)
GFR calc non Af Amer: 64 mL/min/{1.73_m2} (ref >59)
Glucose: 99 mg/dL (ref 65–99)
Potassium: 4.1 mmol/L (ref 3.5–5.2)
Sodium: 140 mmol/L (ref 134–144)

## 2018-07-06 NOTE — Telephone Encounter (Signed)
Notes recorded by Sigurd Sos, RN on 07/06/2018 at 2:00 PM EST The patient has been notified of the result and verbalized understanding. All questions (if any) were answered. Sigurd Sos, RN 07/06/2018 2:00 PM

## 2018-07-06 NOTE — Telephone Encounter (Signed)
-----   Message from Allayne Butcher, [place] sent at 07/06/2018  1:03 PM EST ----- Labs look good after starting losartan. Keep meds the same as what was instructed recently by our pharmacist.

## 2018-07-22 ENCOUNTER — Ambulatory Visit: Payer: Medicare Other

## 2018-07-26 ENCOUNTER — Ambulatory Visit: Payer: Medicare Other

## 2018-09-02 ENCOUNTER — Other Ambulatory Visit: Payer: Self-pay | Admitting: Internal Medicine

## 2018-09-03 ENCOUNTER — Other Ambulatory Visit: Payer: Self-pay | Admitting: Cardiology

## 2018-09-03 MED ORDER — LOSARTAN POTASSIUM 50 MG PO TABS: 50.0000 mg | ORAL_TABLET | Freq: Every day | ORAL | 3 refills | Status: DC

## 2018-09-03 NOTE — Telephone Encounter (Signed)
Pt's pharmacy requesting a 90 day supply. Rx was resent in for 90 day supply with 3 refills. Confirmation received.

## 2018-12-20 ENCOUNTER — Other Ambulatory Visit: Payer: Self-pay | Admitting: Internal Medicine

## 2018-12-27 ENCOUNTER — Telehealth: Payer: Medicare Other | Admitting: Cardiology

## 2018-12-28 ENCOUNTER — Ambulatory Visit: Payer: Medicare Other | Admitting: Internal Medicine

## 2018-12-28 ENCOUNTER — Other Ambulatory Visit: Payer: Self-pay

## 2019-01-24 ENCOUNTER — Encounter: Payer: Self-pay | Admitting: Physician Assistant

## 2019-01-24 NOTE — Progress Notes (Deleted)
Cardiology Office Note    Date:  01/24/2019   ID:  Kimberly PlanBonnie C Deroos, DOB 27-Mar-1941, MRN [id number]  PCP:  Clayborn Heronankins, [name], MD  Cardiologist:  Armanda Magicraci Turner, MD  Electrophysiologist:  None   Chief Complaint: f/u BP, CHF  History of Present Illness:   Kimberly [name] is a 78 y.o. female with history of normal cath in 2010, junctional bradycardia, chronic diastolic HF, HTN, chronic LEE, hypothyroidism, OSA (followed by pulmonology) who presents for f/u. To recap prior history, Ms.Priceis followed by Dr. Mayford Knifeurner given h/o chronic diastolic HF and HTN. Pulmonology follows her OSA. She had a cath in 2010 due to CP showing normal coronary arteries with elevated LVEDP felt to be due to diastolic CHF vs. Microvascular angina. Her CP resolved for a while but reoccurred and nuclear stress test 09/2014 was normal. In 06/2017 she was admitted with junctional bradycardia therefore clonidine and metoprolol were stopped. There was mention of possible accidental overdose/compliance issues raised in hospital notes. Amlodipine was started but she developed LEE so this was changed to hydralazine. She developed headache/dizziness after taking hydralazine so this was stopped. She was restarted on lower dose of amlodipine with concomitant titration of losartan. It does not appear she had BP f/u as recommended. Last echo 06/2017 mild LVH, EF 55-60%, grade 2 DD, mild MR, severe LAE, mildly dilated RV, mild TR. Last labs 06/2018 K 4.1, Cr 0.88, TSH and Ft4 wnl, 06/2017 CBC wnl, no recent lipids on file.  memory issues? osa?   Chronic diastolic CHF Essential HTN Junctional bradycardia Mild mitral regurgitation and tricuspid regurgitation  Past Medical History:  Diagnosis Date  . Acid reflux   . Anxiety   . Arthritis   . Back spasm   . Bradycardia    a. junctional and sinus brady during admission 06/2017 requiring cessation of beta blocker and clonidine.  . Chronic diastolic heart failure (HCC) 03/20/2015  .  Chronic edema   . Depression   . Diabetes mellitus without complication (HCC)    diet controled  . Dietary noncompliance   . Edema extremities   . Frequency of urination   . Fuch's endothelial dystrophy    bilateral eyes  . Headache    occ  . HOH (hard of hearing)    wears hearing aid in left ear  . Hypercholesteremia   . Hypertensive heart disease with CHF (HCC) 09/11/2014  . Insomnia   . Junctional bradycardia   . Mild mitral regurgitation   . Mild tricuspid regurgitation   . Normal coronary arteries 2010   cath with no obstructive epicardial disease.  Done for CP which was felt to be due to microvascular angina or diastolic HF  . OSA on CPAP    Followed by Dr. Fannie Kneelint Young    Past Surgical History:  Procedure Laterality Date  . ABDOMINAL HYSTERECTOMY    . APPENDECTOMY    . CARDIAC CATHETERIZATION     no CAD  . CESAREAN SECTION     x 2  . CHOLECYSTECTOMY    . COLONOSCOPY    . EYE SURGERY Right    implant  . ROTATOR CUFF REPAIR Left   . SHOULDER ARTHROSCOPY WITH SUBACROMIAL DECOMPRESSION AND BICEP TENDON REPAIR Left 08/24/2012   Procedure: SHOULDER ARTHROSCOPY WITH SUBACROMIAL DECOMPRESSION AND BICEP TENDON REPAIR;  Surgeon: Cammy CopaGregory Scott Dean, MD;  [facility];  Service: Orthopedics;  Laterality: Left;  Left Shoulder Arthroscopy, Debridement, Biceps Tenotomy  . THYROIDECTOMY N/A 06/12/2015   Procedure: TOTAL THYROIDECTOMY;  Surgeon: Darnell Level, MD;  Location: [facility] OR;  Service: General;  Laterality: N/A;    Current Medications: No outpatient medications have been marked as taking for the 01/26/19 encounter (Appointment) with Laurann Montana, PA-C.   ***   Allergies:   Percocet [oxycodone-acetaminophen], Codeine, Other, Phenergan [promethazine hcl], Clonidine derivatives, Hydralazine, and Metoprolol   Social History   Socioeconomic History  . Marital status: Married    Spouse name: Not on file  . Number of children: 2  . Years of education: Not on file  . Highest  education level: Not on file  Occupational History  . Occupation: Radiographer, therapeutic  Social Needs  . Financial resource strain: Not on file  . Food insecurity    Worry: Not on file    Inability: Not on file  . Transportation needs    Medical: Not on file    Non-medical: Not on file  Tobacco Use  . Smoking status: Never Smoker  . Smokeless tobacco: Never Used  Substance and Sexual Activity  . Alcohol use: No    Alcohol/week: 0.0 standard drinks  . Drug use: No  . Sexual activity: Not on file  Lifestyle  . Physical activity    Days per week: Not on file    Minutes per session: Not on file  . Stress: Not on file  Relationships  . Social Musician on phone: Not on file    Gets together: Not on file    Attends religious service: Not on file    Active member of club or organization: Not on file    Attends meetings of clubs or organizations: Not on file    Relationship status: Not on file  Other Topics Concern  . Not on file  Social History Narrative  . Not on file     Family History:  The patient's ***family history includes Cancer in her sister; Stomach cancer in her mother.  ROS:   Please see the history of present illness. Otherwise, review of systems is positive for ***.  All other systems are reviewed and otherwise negative.    EKGs/Labs/Other Studies Reviewed:    Studies reviewed were summarized above.   EKG:  EKG is ordered today, personally reviewed, demonstrating ***  Recent Labs: 06/14/2018: TSH 3.93 07/05/2018: BUN 27; Creatinine, Ser 0.88; Potassium 4.1; Sodium 140  Recent Lipid Panel    Component Value Date/Time   CHOL 152 10/02/2015 0901   TRIG 169 (H) 10/02/2015 0901   HDL 30 (L) 10/02/2015 0901   CHOLHDL 5.1 (H) 10/02/2015 0901   VLDL 34 (H) 10/02/2015 0901   LDLCALC 88 10/02/2015 0901    PHYSICAL EXAM:    VS:  There were no vitals taken for this visit.  BMI: There is no height or weight on file to calculate BMI.  GEN:  Well nourished, well developed, in no acute distress HEENT: normocephalic, atraumatic Neck: no JVD, carotid bruits, or masses Cardiac: ***RRR; no murmurs, rubs, or gallops, no edema  Respiratory:  clear to auscultation bilaterally, normal work of breathing GI: soft, nontender, nondistended, + BS MS: no deformity or atrophy Skin: warm and dry, no rash Neuro:  Alert and Oriented x 3, Strength and sensation are intact, follows commands Psych: euthymic mood, full affect  Wt Readings from Last 3 Encounters:  06/16/18 185 lb 12.8 oz (84.3 kg)  06/14/18 182 lb (82.6 kg)  09/25/17 177 lb (80.3 kg)     ASSESSMENT & PLAN:   1. ***  Disposition: F/u with ***   Medication Adjustments/Labs and Tests Ordered: Current medicines are reviewed at length with the patient today.  Concerns regarding medicines are outlined above. Medication changes, Labs and Tests ordered today are summarized above and listed in the Patient Instructions accessible in Encounters.   Signed, Charlie Pitter, PA-C  01/24/2019 8:15 PM    [facility] [address], [place], [place]  [address] Phone: [phone]; Fax: [phone]

## 2019-01-26 ENCOUNTER — Ambulatory Visit: Payer: Medicare Other | Admitting: Physician Assistant

## 2019-02-18 NOTE — Progress Notes (Signed)
Cardiology Office Note    Date:  02/21/2019   ID:  Kimberly [name], DOB 1941-03-07, MRN [id number]  PCP:  Aretta Nip, MD  Cardiologist:  Fransico Him, MD  Electrophysiologist:  None   Chief Complaint: f/u CHF, bradycardia, sad  History of Present Illness:   Kimberly [name] is a 78 y.o. female with history of normal cath in 2010, junctional bradycardia, chronic diastolic HF, with chronic LEE, HTN, hypothyroidism, OSA (followed by pulmonology), hyperlipidemia (followed by PCP), hearing loss and possible prior medication confusion who presents for 6 month f/u of CHF.  To recap prior history, she had a cath in 2010 due to CP showing normal coronary arteries with elevated LVEDP felt to be due to diastolic CHF vs. microvascular angina. Her CP resolved for a while but reoccurred and nuclear stress test 09/2014 was normal.  Her blood pressure management has been tumultuous in the past, with notes outlining that she is hard of hearing and gets confused easily. She has been on varying doses of amlodipine over the years as well as lisinopril, HCTZ, and chlorthalidone at one point. Amlodipine had to be reduced to 5mg  daily due to lower extremity edema. She had rise in creatinine with chlorthalidone 25mg  daily so this was discontinued. I cannot find why HCTZ was stopped. At some point ACEI was changed to losartan. She had issues  In 06/2017 she was admitted with junctional bradycardia therefore clonidine and metoprolol were stopped. There was mention of possible accidental overdose raised in hospital notes as daughter questioned potential memory issues. She was trialed on hydralazine in the past but developed headache/dizziness after taking hydralazine so this was stopped. At last OV, she was restarted on lower dose of amlodipine with titration of losartan to 50mg  daily. It does not appear she came for f/u as recommended - this was right around beginning of pandemic. Last echo 06/2017 mild LVH, EF  55-60%, grade 2 DD, mild MR, severe LAE, mildly dilated RV, mild TR. Last labs 06/2018 K 4.1, Cr 0.88, TSH and FT4 wnl, 06/2017 CBC wnl.  She returns for routine follow-up today. She is very sad during the visit and frequently redirects to sadness over her current social situation. Her husband had valve surgery earlier this year and she has found it challenging to be his only caregiver. The pandemic has affected them some but it is mostly her family situation she's depressed about. She states she tries to get out in the yard for fresh air which friends have suggested may help but it doesn't really make a difference. At one point she had a caregiver come out to the house after his valve surgery but she did not find this particularly helpful. She reports they have numerous family members in the area that try to help, including a granddaughter who comes over every Wednesday, but emotionally this has been hard on her. Neither of them drive. No SI/HI. She has a rare fleeting chest pain but no anginal type pain. From a cardiac standpoint she denies any obvious new changes in how she feels. No dyspnea. She has lower extremity edema on exam which she states looks relatively stable for her except maybe a little worse. She also complains of balance difficulties and persistent visual difficulties in her left eye ever since cataract surgery. No palpitations, dizziness or syncope. Recheck BP by me 182/85.   Past Medical History:  Diagnosis Date   Acid reflux    Anxiety    Arthritis  Back spasm    Bradycardia    a. junctional and sinus brady during admission 06/2017 requiring cessation of beta blocker and clonidine.   Chronic diastolic heart failure (HCC) 03/20/2015   Chronic edema    Depression    Diabetes mellitus without complication (HCC)    diet controled   Dietary noncompliance    Edema extremities    Frequency of urination    Fuch's endothelial dystrophy    bilateral eyes   Headache     occ   HOH (hard of hearing)    wears hearing aid in left ear   Hypercholesteremia    Hypertensive heart disease with CHF (HCC) 09/11/2014   Insomnia    Junctional bradycardia    Mild mitral regurgitation    Mild tricuspid regurgitation    Normal coronary arteries 2010   cath with no obstructive epicardial disease.  Done for CP which was felt to be due to microvascular angina or diastolic HF   OSA on CPAP    Followed by Dr. Fannie Knee    Past Surgical History:  Procedure Laterality Date   ABDOMINAL HYSTERECTOMY     APPENDECTOMY     CARDIAC CATHETERIZATION     no CAD   CESAREAN SECTION     x 2   CHOLECYSTECTOMY     COLONOSCOPY     EYE SURGERY Right    implant   ROTATOR CUFF REPAIR Left    SHOULDER ARTHROSCOPY WITH SUBACROMIAL DECOMPRESSION AND BICEP TENDON REPAIR Left 08/24/2012   Procedure: SHOULDER ARTHROSCOPY WITH SUBACROMIAL DECOMPRESSION AND BICEP TENDON REPAIR;  Surgeon: Cammy Copa, MD;  Location: [facility] OR;  Service: Orthopedics;  Laterality: Left;  Left Shoulder Arthroscopy, Debridement, Biceps Tenotomy   THYROIDECTOMY N/A 06/12/2015   Procedure: TOTAL THYROIDECTOMY;  Surgeon: Darnell Level, MD;  Location: [facility];  Service: General;  Laterality: N/A;    Current Medications: Current Meds  Medication Sig   amLODipine (NORVASC) 5 MG tablet Take 1 tablet (5 mg total) by mouth daily.   amoxicillin (AMOXIL) 500 MG capsule Use as directed   aspirin 81 MG chewable tablet Chew 81 mg by mouth daily.   Cholecalciferol (VITAMIN D3) 5000 units CAPS Take 5,000 Units by mouth daily.   clonazePAM (KLONOPIN) 1 MG tablet TAKE 1 4 TO 1 TABLET BY MOUTH EVERY 12 HOURS AS NEEDED FOR ANXIETY   fenofibrate micronized (LOFIBRA) 200 MG capsule Take 200 mg by mouth daily before breakfast.   furosemide (LASIX) 20 MG tablet TAKE 1 TABLET BY MOUTH ONCE DAILY   levothyroxine (SYNTHROID) 137 MCG tablet TAKE 1 TABLET BY MOUTH ONCE DAILY BEFORE BREAKFAST    Lido-Capsaicin-Men-Methyl Sal (SOOTHEE EX) Apply 1 drop topically daily. Eye drops   losartan (COZAAR) 50 MG tablet Take 1 tablet (50 mg total) by mouth daily.   nitroGLYCERIN (NITROSTAT) 0.4 MG SL tablet Place 1 tablet (0.4 mg total) under the tongue every 5 (five) minutes as needed for chest pain.   prednisoLONE acetate (PRED FORTE) 1 % ophthalmic suspension Use as directed   traMADol (ULTRAM) 50 MG tablet Take 1-2 tablets (50-100 mg total) by mouth every 6 (six) hours as needed for moderate pain or severe pain.   zolpidem (AMBIEN) 10 MG tablet Take 5 mg by mouth at bedtime. For sleep      Allergies:   Percocet [oxycodone-acetaminophen], Codeine, Other, Phenergan [promethazine hcl], Clonidine derivatives, Hydralazine, and Metoprolol   Social History   Socioeconomic History   Marital status: Married    Spouse name:  Not on file   Number of children: 2   Years of education: Not on file   Highest education level: Not on file  Occupational History   Occupation: Radiographer, therapeutic  Social Needs   Financial resource strain: Not on file   Food insecurity    Worry: Not on file    Inability: Not on file   Transportation needs    Medical: Not on file    Non-medical: Not on file  Tobacco Use   Smoking status: Never Smoker   Smokeless tobacco: Never Used  Substance and Sexual Activity   Alcohol use: No    Alcohol/week: 0.0 standard drinks   Drug use: No   Sexual activity: Not on file  Lifestyle   Physical activity    Days per week: Not on file    Minutes per session: Not on file   Stress: Not on file  Relationships   Social connections    Talks on phone: Not on file    Gets together: Not on file    Attends religious service: Not on file    Active member of club or organization: Not on file    Attends meetings of clubs or organizations: Not on file    Relationship status: Not on file  Other Topics Concern   Not on file  Social History Narrative    Not on file     Family History:  The patient's family history includes Cancer in her sister; Stomach cancer in her mother.  ROS:   Please see the history of present illness.  All other systems are reviewed and otherwise negative.    EKGs/Labs/Other Studies Reviewed:    Studies reviewed were summarized above.   EKG:  EKG is ordered today, personally reviewed, demonstrating NSR 76bpm, nonspecific STT changes with TWI I, avL. Similar to prior.  Recent Labs: 06/14/2018: TSH 3.93 07/05/2018: BUN 27; Creatinine, Ser 0.88; Potassium 4.1; Sodium 140  Recent Lipid Panel    Component Value Date/Time   CHOL 152 10/02/2015 0901   TRIG 169 (H) 10/02/2015 0901   HDL 30 (L) 10/02/2015 0901   CHOLHDL 5.1 (H) 10/02/2015 0901   VLDL 34 (H) 10/02/2015 0901   LDLCALC 88 10/02/2015 0901    PHYSICAL EXAM:    VS:  BP (!) 190/78    Pulse 78    Ht 4' 11.5" (1.511 m)    Wt 184 lb 1.9 oz (83.5 kg)    SpO2 96%    BMI 36.57 kg/m   BMI: Body mass index is 36.57 kg/m.  GEN: Well nourished, well developed obese WF, in no acute distress HEENT: normocephalic, atraumatic Neck: no JVD, carotid bruits, or masses Cardiac: RRR; no murmurs, rubs, or gallops, trace bilateral stalklike puffy edema superimposed on baseline large leg habitus Respiratory:  clear to auscultation bilaterally, normal work of breathing GI: soft, nontender, nondistended, + BS MS: no deformity or atrophy Skin: warm and dry, no rash Neuro:  Alert and Oriented x 3, Strength and sensation are intact, follows commands, hard of hearing Psych: tearful, depressed, no SI/HI  Wt Readings from Last 3 Encounters:  02/21/19 184 lb 1.9 oz (83.5 kg)  06/16/18 185 lb 12.8 oz (84.3 kg)  06/14/18 182 lb (82.6 kg)     ASSESSMENT & PLAN:   1. Chronic diastolic CHF - volume status a little challenging to assess given her body habitus. Weight is overall stable and lungs are totally clear. She does have some chronic appearing lower extremity edema  which  has the appearance of non-pitting venous insufficiency. Amlodipine is also likely contributing, along with her poorly controlled blood pressure. Unfortunately her medication options for hypertension are limited given prior intolerances as above. She needs additional antihypertensive control. This may not entirely help her edema but is a start - would prefer to start with one medication adjustment at a time given her previous intolerances. Will titrate losartan to 100mg  daily. Will check CMET, CBC, TSH today. If labs are stable, I may also have her take extra Lasix for a short course. We had a discussion about plan for follow-up as she has some outstanding social stressors going on that need to be addressed as well. Anecdotally, we find blood pressure medication titration challenging in patients who have evidence of uncontrolled depression/anxiety as they tend to experience perceived medication intolerances much easier than the general population. Therefore, it is typically helpful if this is addressed either before or while medication changes are being made. She appears clinically depressed and I truly feel she would best be served being seen back in a primary care setting to help address her psychosocial wellbeing. I feel her blood pressure can also be managed in this capacity as well, instead of trying to overwhelm her with appointments at different locations, unfamiliar providers, and too many "cooks in the kitchen" with medication prescriptions coming from different sources. Based on our interaction today I can see where she could get confused easily. I have requested she see her PCP back within 1 week. If blood pressure is still poorly controlled at that time and edema persists, consideration could be given to re-trial of lower dose chlorthalidone vs trial of spironolactone perhaps. We are happy to help if primary care needs further input at that time - we are just a call away. Will route this note to Dr.  Barbaraann Barthelankins. 2. Essential HTN - see above. Complicated situation. Will add thyroid to labs. 3. Junctional bradycardia - no recurrence off beta blocker/clonidine. Continue to monitor. 4. Mild mitral regurgitation and tricuspid regurgitation - no significant murmur on exam. Follow clinically. Consider repeat echocardiogram in 2024. 5. Depression - no acute SI/HI. It sounds like she has social support from family but depression persists beyond that. I stressed the importance of being as truthful with her PCP as she was with me today. This is currently the crux of her issues beyond anything else as far as her quality of life.  Disposition: F/u with PCP in 1 week, and Dr. Mayford Knifeurner in 6 months.  Medication Adjustments/Labs and Tests Ordered: Current medicines are reviewed at length with the patient today.  Concerns regarding medicines are outlined above. Medication changes, Labs and Tests ordered today are summarized above and listed in the Patient Instructions accessible in Encounters.   Signed, Laurann Montanaayna N Tanika Bracco, PA-C  02/21/2019 3:45 PM    [facility] [address], [place], [place]  [address] Phone: [phone]; Fax: [phone]

## 2019-02-21 ENCOUNTER — Encounter: Payer: Self-pay | Admitting: Physician Assistant

## 2019-02-21 ENCOUNTER — Other Ambulatory Visit: Payer: Self-pay

## 2019-02-21 ENCOUNTER — Ambulatory Visit (INDEPENDENT_AMBULATORY_CARE_PROVIDER_SITE_OTHER): Payer: Medicare Other | Admitting: Physician Assistant

## 2019-02-21 VITALS — BP 190/78 | HR 78 | Ht 59.5 in | Wt 184.1 lb

## 2019-02-21 DIAGNOSIS — I10 Essential (primary) hypertension: Secondary | ICD-10-CM

## 2019-02-21 DIAGNOSIS — I340 Nonrheumatic mitral (valve) insufficiency: Secondary | ICD-10-CM

## 2019-02-21 DIAGNOSIS — Z23 Encounter for immunization: Secondary | ICD-10-CM

## 2019-02-21 DIAGNOSIS — F32A Depression, unspecified: Secondary | ICD-10-CM

## 2019-02-21 DIAGNOSIS — R001 Bradycardia, unspecified: Secondary | ICD-10-CM | POA: Diagnosis not present

## 2019-02-21 DIAGNOSIS — I5032 Chronic diastolic (congestive) heart failure: Secondary | ICD-10-CM

## 2019-02-21 DIAGNOSIS — F329 Major depressive disorder, single episode, unspecified: Secondary | ICD-10-CM

## 2019-02-21 DIAGNOSIS — R600 Localized edema: Secondary | ICD-10-CM

## 2019-02-21 DIAGNOSIS — I071 Rheumatic tricuspid insufficiency: Secondary | ICD-10-CM

## 2019-02-21 MED ORDER — LOSARTAN POTASSIUM 100 MG PO TABS: 100.0000 mg | ORAL_TABLET | Freq: Every day | ORAL | 3 refills | Status: DC

## 2019-02-21 NOTE — Patient Instructions (Addendum)
Medication Instructions:  Your physician has recommended you make the following change in your medication:  1.  INCREASE the Losartan to 100 mg taking 1 tablet daily   If you need a refill on your cardiac medications before your next appointment, please call your pharmacy.   Lab work: None ordered TODAY:  CMET, CBC, & TSH  If you have labs (blood work) drawn today and your tests are completely normal, you will receive your results only by: Marland Kitchen MyChart Message (if you have MyChart) OR . A paper copy in the mail If you have any lab test that is abnormal or we need to change your treatment, we will call you to review the results.  Testing/Procedures: None ordered  Follow-Up: At [facility], you and your health needs are our priority.  As part of our continuing mission to provide you with exceptional heart care, we have created designated Provider Care Teams.  These Care Teams include your primary Cardiologist (physician) and Advanced Practice Providers (APPs -  Physician Assistants and Nurse Practitioners) who all work together to provide you with the care you need, when you need it. You will need a follow up appointment in 6 months.  Please call our office 2 months in advance to schedule this appointment.  You may see Fransico Him, MD or one of the following Advanced Practice Providers on your designated Care Team:   [place], PA-C Melina Copa, PA-C . Ermalinda Barrios, PA-C  Any Other Special Instructions Will Be Listed Below (If Applicable).

## 2019-02-22 LAB — COMPREHENSIVE METABOLIC PANEL
ALT: 14 IU/L (ref 0–32)
AST: 23 IU/L (ref 0–40)
Albumin/Globulin Ratio: 1.8 (ref 1.2–2.2)
Albumin: 4.2 g/dL (ref 3.7–4.7)
Alkaline Phosphatase: 47 IU/L (ref 39–117)
BUN/Creatinine Ratio: 22 (ref 12–28)
BUN: 22 mg/dL (ref 8–27)
Bilirubin Total: <0.2 mg/dL (ref 0.0–1.2)
CO2: 25 mmol/L (ref 20–29)
Calcium: 10 mg/dL (ref 8.7–10.3)
Chloride: 100 mmol/L (ref 96–106)
Creatinine, Ser: 1.01 mg/dL — ABNORMAL HIGH (ref 0.57–1.00)
GFR calc Af Amer: 62 mL/min/{1.73_m2} (ref >59)
GFR calc non Af Amer: 54 mL/min/{1.73_m2} — ABNORMAL LOW (ref >59)
Globulin, Total: 2.4 g/dL (ref 1.5–4.5)
Glucose: 101 mg/dL — ABNORMAL HIGH (ref 65–99)
Potassium: 3.8 mmol/L (ref 3.5–5.2)
Sodium: 142 mmol/L (ref 134–144)
Total Protein: 6.6 g/dL (ref 6.0–8.5)

## 2019-02-22 LAB — CBC
Hematocrit: 38.5 % (ref 34.0–46.6)
Hemoglobin: 13.1 g/dL (ref 11.1–15.9)
MCH: 29.8 pg (ref 26.6–33.0)
MCHC: 34 g/dL (ref 31.5–35.7)
MCV: 88 fL (ref 79–97)
Platelets: 261 10*3/uL (ref 150–450)
RBC: 4.39 x10E6/uL (ref 3.77–5.28)
RDW: 12 % (ref 11.7–15.4)
WBC: 8 10*3/uL (ref 3.4–10.8)

## 2019-02-22 LAB — TSH: TSH: 15.7 u[IU]/mL — ABNORMAL HIGH (ref 0.450–4.500)

## 2019-02-25 ENCOUNTER — Other Ambulatory Visit: Payer: Self-pay

## 2019-03-01 ENCOUNTER — Ambulatory Visit: Payer: Medicare Other | Admitting: Internal Medicine

## 2019-03-01 ENCOUNTER — Encounter: Payer: Self-pay | Admitting: Internal Medicine

## 2019-03-01 ENCOUNTER — Other Ambulatory Visit: Payer: Self-pay

## 2019-03-01 VITALS — BP 140/88 | HR 75 | Ht 59.5 in | Wt 184.0 lb

## 2019-03-01 DIAGNOSIS — Z9889 Other specified postprocedural states: Secondary | ICD-10-CM | POA: Diagnosis not present

## 2019-03-01 DIAGNOSIS — E890 Postprocedural hypothyroidism: Secondary | ICD-10-CM

## 2019-03-01 DIAGNOSIS — E559 Vitamin D deficiency, unspecified: Secondary | ICD-10-CM

## 2019-03-01 NOTE — Patient Instructions (Addendum)
Please come back for repeat thyroid labs and a vitamin D level in 5-6 weeks.  For now, please continue levothyroxine 137 mcg daily.  Take the thyroid hormone every day, with water, at least 30 minutes before breakfast, separated by at least 4 hours from: - acid reflux medications - calcium - iron - multivitamins  Please move calcium and multivitamins with dinner.  Please come back for a follow-up appointment in 6 months.

## 2019-03-01 NOTE — Progress Notes (Addendum)
Patient ID: Kimberly [name], female   DOB: 19-Nov-1940, 78 y.o.   MRN: [id number]   HPI  Kimberly [name] is a 78 y.o.-year-old female, returning for f/u for hypercalcemia, dx 2012, postsurgical hypothyroidism. Last visit was 8 months ago.  She is limited in coming for the appointments by the need for transportation.  She had problems with compliance with her levothyroxine in the past.  In 06/2017 he was hospitalized and was found to have a junctional bradycardia with a TSH of 42.  At that time, daughter reported that she was not taking the levothyroxine consistently.  Afterwards, she started to put the medication bottle on her nightstand and was consistently taking it daily.  However, she recently had a TSH level that was high, at 15, 8 days ago.  She is still stressed with her husband being sick.  He has prostate cancer and finished treatment.  He also had stents in his legs and valvular surgery.  He walks with a walker.  S/p Thyroidectomy: She had thyroidectomy for substernal goiter in 05/2015: Benign, nodular pathology  Postsurgical hypothyroidism:  Pt is on levothyroxine 137 mcg daily, taken: - in am - fasting - at least 30 min from b'fast - + Calcium, MVI 1h after she eats b'fast! - no Fe, PPIs - not on Biotin  TFTs were normal at last visit, but most recent level was very high: Lab Results  Component Value Date   TSH 15.700 (H) 02/21/2019   TSH 3.93 06/14/2018   TSH 1.74 09/24/2017   TSH 16.43 (H) 06/25/2017   TSH 42.727 (H) 06/12/2017   TSH 41.60 (H) 01/16/2016   TSH 31.80 (H) 09/18/2015   TSH 1.128 08/25/2014   FREET4 0.98 06/14/2018   FREET4 1.19 09/24/2017   FREET4 1.06 06/25/2017   FREET4 0.84 01/16/2016   FREET4 0.83 09/18/2015  07/31/2015: TSH 58  Surprisingly, her calcium level normalized after her thyroid surgery.  Primary hyperparathyroidism: -Greatly improved/resolved after 2016  Reviewed patient's pertinent labs: Lab Results  Component Value Date   PTH  19 09/18/2015   PTH Comment 09/18/2015   PTH 21 02/08/2015   PTH Comment 02/08/2015   PTH 17 12/09/2013   PTH Comment 12/09/2013   PTH 14.9 08/24/2012   CALCIUM 10.0 02/21/2019   CALCIUM 10.2 07/05/2018   CALCIUM 9.9 06/16/2018   CALCIUM 10.2 06/14/2018   CALCIUM 9.7 06/13/2017   CALCIUM 10.3 06/12/2017   CALCIUM 9.9 01/13/2017   CALCIUM 10.2 01/23/2016   CALCIUM 10.2 12/20/2015   CALCIUM 9.4 12/06/2015  12/25/2014: Calcium 10.8 (8.6-10.3) 09/12/2013: Ca 10.8, iCa 5.7 (4.5-5.6)  She has a history of vitamin D deficiency but latest level was normal: Lab Results  Component Value Date   VD25OH 57.47 06/14/2018   VD25OH 61.83 09/24/2017   VD25OH 32.67 09/18/2015   VD25OH 28.65 (L) 02/08/2015   VD25OH 50.59 12/09/2013   She continues on 5000 units vitamin D daily  Reviewed other pertinent labs: Component     Latest Ref Rng 02/08/2015 02/08/2015         2:14 PM  2:14 PM  Vitamin A (Retinoic Acid)     38 - 98 mcg/dL 89    Component     Latest Ref Rng 12/09/2013  Vitamin D 1, 25 (OH) Total     18 - 72 pg/mL 79 (H)  Vitamin D3 1, 25 (OH)      79  Vitamin D2 1, 25 (OH)      <8  PTH-Related Protein (PTH-RP)  14 - 27 pg/mL 16  Phosphorus     2.3 - 4.6 mg/dL 3.2   Her urine calcium was 5: Component     Latest Ref Rng 01/20/2014  Calcium, Ur      14  Calcium, 24 hour urine     100 - 250 mg/day 322 (H)  Creatinine, Urine      51.9  Creatinine, 24H Ur     700 - 1800 mg/day 1194   No history of osteoporosis.  No fractures, but falls more lately.  No kidney stones.  I reviewed patient's previous DXA scan - 2008:  LUMBAR SPINE (L1-L3)  BMD: 1.273  T-score (% of young adult value): 2.3  Z-score (% adult age match value): 4.1  LEFT HIP (NECK)  BMD: 0.831  T-score (% of young adult value): -0.2  Z-score (% adult age match value): 1.4  Assessment: This patient is considered normal according to [facility]) criteria.   She has CKD.  Latest  BUN/creatinine reviewed: Lab Results  Component Value Date   BUN 22 02/21/2019   CREATININE 1.01 (H) 02/21/2019    She has had a technetium sestamibi scan (04/2015) that was negative for a parathyroid adenoma e and a CT scan of her neck (04/2015) there was also negative for possible parathyroid nodule nodule but this showed a substernal goiter with tracheal narrowing.  She also has a history of HTN; HLD; DM 2; CKD- sees Dr Clover Mealy.  ROS: Constitutional: no weight gain/no weight loss, no fatigue, no subjective hyperthermia, no subjective hypothermia Eyes: no blurry vision, no xerophthalmia ENT: no sore throat, no nodules palpated in neck, no dysphagia, no odynophagia, no hoarseness Cardiovascular: no CP/no SOB/no palpitations/no leg swelling Respiratory: no cough/no SOB/no wheezing Gastrointestinal: no N/no V/no D/no C/no acid reflux Musculoskeletal: no muscle aches/no joint aches Skin: no rashes, no hair loss Neurological: no tremors/no numbness/no tingling/no dizziness  I reviewed pt's medications, allergies, PMH, social hx, family hx, and changes were documented in the history of present illness. Otherwise, unchanged from my initial visit note.  Past Medical History:  Diagnosis Date  . Acid reflux   . Anxiety   . Arthritis   . Back spasm   . Bradycardia    a. junctional and sinus brady during admission 06/2017 requiring cessation of beta blocker and clonidine.  . Chronic diastolic heart failure ([place]) 03/20/2015  . Chronic edema   . Depression   . Diabetes mellitus without complication ([place])    diet controled  . Dietary noncompliance   . Edema extremities   . Frequency of urination   . Fuch's endothelial dystrophy    bilateral eyes  . Headache    occ  . HOH (hard of hearing)    wears hearing aid in left ear  . Hypercholesteremia   . Hypertensive heart disease with CHF ([place]) 09/11/2014  . Insomnia   . Junctional bradycardia   . Mild mitral regurgitation   . Mild  tricuspid regurgitation   . Normal coronary arteries 2010   cath with no obstructive epicardial disease.  Done for CP which was felt to be due to microvascular angina or diastolic HF  . OSA on CPAP    Followed by Dr. Keturah Barre   Past Surgical History:  Procedure Laterality Date  . ABDOMINAL HYSTERECTOMY    . APPENDECTOMY    . CARDIAC CATHETERIZATION     no CAD  . CESAREAN SECTION     x 2  . CHOLECYSTECTOMY    .  COLONOSCOPY    . EYE SURGERY Right    implant  . ROTATOR CUFF REPAIR Left   . SHOULDER ARTHROSCOPY WITH SUBACROMIAL DECOMPRESSION AND BICEP TENDON REPAIR Left 08/24/2012   Procedure: SHOULDER ARTHROSCOPY WITH SUBACROMIAL DECOMPRESSION AND BICEP TENDON REPAIR;  Surgeon: Meredith Pel, MD;  Location: [place];  Service: Orthopedics;  Laterality: Left;  Left Shoulder Arthroscopy, Debridement, Biceps Tenotomy  . THYROIDECTOMY N/A 06/12/2015   Procedure: TOTAL THYROIDECTOMY;  Surgeon: Armandina Gemma, MD;  Location: [place];  Service: General;  Laterality: N/A;   History   Social History  . Marital Status: Married    Spouse Name: N/A    Number of Children: 2   Occupational History  . retired-assistant teacher    Social History Main Topics  . Smoking status: Never Smoker   . Smokeless tobacco: Not on file  . Alcohol Use: No  . Drug Use: No   Current Outpatient Medications on File Prior to Visit  Medication Sig Dispense Refill  . amLODipine (NORVASC) 5 MG tablet Take 1 tablet (5 mg total) by mouth daily. 30 tablet 11  . amoxicillin (AMOXIL) 500 MG capsule Use as directed    . aspirin 81 MG chewable tablet Chew 81 mg by mouth daily.    . Cholecalciferol (VITAMIN D3) 5000 units CAPS Take 5,000 Units by mouth daily.    . clonazePAM (KLONOPIN) 1 MG tablet TAKE 1 4 TO 1 TABLET BY MOUTH EVERY 12 HOURS AS NEEDED FOR ANXIETY    . fenofibrate micronized (LOFIBRA) 200 MG capsule Take 200 mg by mouth daily before breakfast.    . furosemide (LASIX) 20 MG tablet TAKE 1 TABLET BY MOUTH  ONCE DAILY 90 tablet 2  . levothyroxine (SYNTHROID) 137 MCG tablet TAKE 1 TABLET BY MOUTH ONCE DAILY BEFORE BREAKFAST 90 tablet 0  . Lido-Capsaicin-Men-Methyl Sal (SOOTHEE EX) Apply 1 drop topically daily. Eye drops    . losartan (COZAAR) 100 MG tablet Take 1 tablet (100 mg total) by mouth daily. 90 tablet 3  . nitroGLYCERIN (NITROSTAT) 0.4 MG SL tablet Place 1 tablet (0.4 mg total) under the tongue every 5 (five) minutes as needed for chest pain. 25 tablet 4  . prednisoLONE acetate (PRED FORTE) 1 % ophthalmic suspension Use as directed    . traMADol (ULTRAM) 50 MG tablet Take 1-2 tablets (50-100 mg total) by mouth every 6 (six) hours as needed for moderate pain or severe pain. 15 tablet 0  . zolpidem (AMBIEN) 10 MG tablet Take 5 mg by mouth at bedtime. For sleep     No current facility-administered medications on file prior to visit.    Allergies  Allergen Reactions  . Percocet [Oxycodone-Acetaminophen] Shortness Of Breath and Other (See Comments)    Pt couldn't breathe or catch her breath  . Codeine Other (See Comments)    hyperactivity  . Other Other (See Comments)    Some pain medication given at [facility] for surgery caused hallucinations   . Phenergan [Promethazine Hcl] Other (See Comments)    Hallucinations   . Clonidine Derivatives     Bradycardia (06/2017)  . Hydralazine     Headache/dizzy  . Metoprolol     Bradycardia 06/2017    Family History  Problem Relation Age of Onset  . Stomach cancer Mother   . Cancer Sister        intestinal   PE: BP 140/88   Pulse 75   Ht 4' 11.5" (1.511 m)   Wt 184 lb (83.5 kg)  SpO2 97%   BMI 36.54 kg/m   Wt Readings from Last 3 Encounters:  03/01/19 184 lb (83.5 kg)  02/21/19 184 lb 1.9 oz (83.5 kg)  06/16/18 185 lb 12.8 oz (84.3 kg)   Constitutional: overweight, in NAD Eyes: PERRLA, EOMI, no exophthalmos ENT: moist mucous membranes, no neck masses, cervical scar healed, no cervical lymphadenopathy Cardiovascular: RRR,  No MRG Respiratory: CTA B Gastrointestinal: abdomen soft, NT, ND, BS+ Musculoskeletal: no deformities, strength intact in all 4 Skin: moist, warm, no rashes Neurological: no tremor with outstretched hands, DTR normal in all 4  Assessment: 1. S/p thyroidectomy  2. Postsurgical hypothyroidism  3. Hypercalcemia - ? primary hyperparathyroidism (PTH not increased...)  Plan: 1. S/p thyroidectomy -Thyroidectomy scar appears healed -No numbness, dysesthesia, keloid, at the site of the surgery -Neck compression symptoms  2. Postsurgical hypothyroidism - latest thyroid labs reviewed with pt >> normal at last visit but was very high on 02/21/2019: At 15.7 - she continues on LT4 137 mcg daily - pt feels good on this dose. - we discussed about taking the thyroid hormone every day, with water, >30 minutes before breakfast, separated by >4 hours from acid reflux medications, calcium, iron, multivitamins. Pt. is now taking calcium and multivitamins and she takes these to close to levothyroxine which I feel may be the reason why her TSH increased.  It is possible that she also missed some doses.  I advised her to take this consistently, and to move calcium and multivitamins at night. -We will recheck her TFTs in 5 to 6 weeks  3. Hypercalcemia -Calcium levels remain normal after parathyroidectomy, interestingly -Reviewed previous investigation: Patient has had several instances of elevated calcium, with the highest being at 11.1.  Of note, her PTH levels were repeatedly checked and they were normal.  A PTHrp level was normal, multiple myeloma work-up was negative, vitamin A level was normal, vitamin D was normal or minimally low, she had a high 1, 25 dihydroxy vitamin D with a normal phosphorus.  Her urinary calcium was high in the past.  A technetium sestamibi scan, thyroid ultrasound, and neck CT did not show any parathyroid masses. -She had no apparent complications from hypercalcemia: No history of  nephrolithiasis, abdominal pain, bone pain -Latest calcium level was normal 02/21/2019 -Previous vitamin D level from 06/2018 was normal.  She continues on 5000 units vitamin D daily. -We will recheck her vitamin D level at the next lab draw  Orders Placed This Encounter  Procedures  . TSH  . T4, free  . VITAMIN D 25 Hydroxy (Vit-D Deficiency, Fractures)   Philemon Kingdom, MD PhD [facility] Endocrinology

## 2019-04-13 ENCOUNTER — Other Ambulatory Visit: Payer: Self-pay

## 2019-04-13 ENCOUNTER — Other Ambulatory Visit (INDEPENDENT_AMBULATORY_CARE_PROVIDER_SITE_OTHER): Payer: Medicare Other

## 2019-04-13 DIAGNOSIS — E559 Vitamin D deficiency, unspecified: Secondary | ICD-10-CM | POA: Diagnosis not present

## 2019-04-13 DIAGNOSIS — E890 Postprocedural hypothyroidism: Secondary | ICD-10-CM

## 2019-04-14 LAB — TSH: TSH: 6.11 u[IU]/mL — ABNORMAL HIGH (ref 0.35–4.50)

## 2019-04-14 LAB — VITAMIN D 25 HYDROXY (VIT D DEFICIENCY, FRACTURES): VITD: 18.27 ng/mL — ABNORMAL LOW (ref 30.00–100.00)

## 2019-04-14 LAB — T4, FREE: Free T4: 1.23 ng/dL (ref 0.60–1.60)

## 2019-04-15 ENCOUNTER — Telehealth: Payer: Self-pay

## 2019-04-15 DIAGNOSIS — E890 Postprocedural hypothyroidism: Secondary | ICD-10-CM

## 2019-04-15 DIAGNOSIS — Z9889 Other specified postprocedural states: Secondary | ICD-10-CM

## 2019-04-15 DIAGNOSIS — E559 Vitamin D deficiency, unspecified: Secondary | ICD-10-CM

## 2019-04-15 NOTE — Telephone Encounter (Signed)
-----   Message from Philemon Kingdom, MD sent at 04/15/2019 11:49 AM EST ----- Lenna Sciara, can you please call pt: Her vitamin D level is much lower than before although we did not change the dose of her vitamin D supplement.  Per my records, she is taking 5000 units daily.  I think she missed some doses.  If so, please ask her to take this every day and come for another vitamin D level in 2 months.  Also, regarding the TSH, this has improved some but it is still elevated.  Can you please check with her if she already moved calcium or multivitamins with dinner.  Also, that she states she missed any doses of levothyroxine?Marland Kitchen  My preference would be to recheck a TSH and free T4 in 2 months and we may need to increase the dose at that time, depending on the level.  Can you please order the vitamin D, TSH and free T4?

## 2019-04-15 NOTE — Telephone Encounter (Signed)
Spoke to patient and notified of message from Dr. Cruzita Lederer.  Patient states she has been taking Vitamin D U5000 each day.  Patient states she is taking the Levothyroxine as directed including when to take the calcium and multivitamins and has not missed any doses.  I notified patient that these labs will need repeated in 2 months to which she agreed and I scheduled lab visit for 2 months out.  Labs ordered per Dr. Cruzita Lederer.

## 2019-05-10 ENCOUNTER — Emergency Department (HOSPITAL_COMMUNITY): Payer: Medicare Other

## 2019-05-10 ENCOUNTER — Observation Stay (HOSPITAL_COMMUNITY)
Admission: EM | Admit: 2019-05-10 | Discharge: 2019-05-11 | Disposition: A | Discharge status: Home / Self Care | Payer: Medicare Other | Attending: Internal Medicine | Admitting: Internal Medicine

## 2019-05-10 ENCOUNTER — Other Ambulatory Visit: Payer: Self-pay

## 2019-05-10 ENCOUNTER — Encounter (HOSPITAL_COMMUNITY): Payer: Self-pay | Admitting: Emergency Medicine

## 2019-05-10 DIAGNOSIS — I10 Essential (primary) hypertension: Secondary | ICD-10-CM | POA: Diagnosis not present

## 2019-05-10 DIAGNOSIS — H18513 Endothelial corneal dystrophy, bilateral: Secondary | ICD-10-CM | POA: Diagnosis not present

## 2019-05-10 DIAGNOSIS — E876 Hypokalemia: Secondary | ICD-10-CM | POA: Insufficient documentation

## 2019-05-10 DIAGNOSIS — R9431 Abnormal electrocardiogram [ECG] [EKG]: Secondary | ICD-10-CM | POA: Insufficient documentation

## 2019-05-10 DIAGNOSIS — F329 Major depressive disorder, single episode, unspecified: Secondary | ICD-10-CM | POA: Insufficient documentation

## 2019-05-10 DIAGNOSIS — G9340 Encephalopathy, unspecified: Secondary | ICD-10-CM | POA: Diagnosis not present

## 2019-05-10 DIAGNOSIS — N1832 Chronic kidney disease, stage 3b: Secondary | ICD-10-CM | POA: Diagnosis present

## 2019-05-10 DIAGNOSIS — Z7982 Long term (current) use of aspirin: Secondary | ICD-10-CM | POA: Diagnosis not present

## 2019-05-10 DIAGNOSIS — G4733 Obstructive sleep apnea (adult) (pediatric): Secondary | ICD-10-CM | POA: Diagnosis not present

## 2019-05-10 DIAGNOSIS — I130 Hypertensive heart and chronic kidney disease with heart failure and stage 1 through stage 4 chronic kidney disease, or unspecified chronic kidney disease: Secondary | ICD-10-CM | POA: Insufficient documentation

## 2019-05-10 DIAGNOSIS — R4182 Altered mental status, unspecified: Secondary | ICD-10-CM | POA: Insufficient documentation

## 2019-05-10 DIAGNOSIS — E7800 Pure hypercholesterolemia, unspecified: Secondary | ICD-10-CM | POA: Diagnosis not present

## 2019-05-10 DIAGNOSIS — E890 Postprocedural hypothyroidism: Secondary | ICD-10-CM | POA: Diagnosis present

## 2019-05-10 DIAGNOSIS — E1122 Type 2 diabetes mellitus with diabetic chronic kidney disease: Secondary | ICD-10-CM | POA: Diagnosis not present

## 2019-05-10 DIAGNOSIS — I081 Rheumatic disorders of both mitral and tricuspid valves: Secondary | ICD-10-CM | POA: Insufficient documentation

## 2019-05-10 DIAGNOSIS — Z79899 Other long term (current) drug therapy: Secondary | ICD-10-CM | POA: Insufficient documentation

## 2019-05-10 DIAGNOSIS — F419 Anxiety disorder, unspecified: Secondary | ICD-10-CM | POA: Insufficient documentation

## 2019-05-10 DIAGNOSIS — E785 Hyperlipidemia, unspecified: Secondary | ICD-10-CM | POA: Insufficient documentation

## 2019-05-10 DIAGNOSIS — G4700 Insomnia, unspecified: Secondary | ICD-10-CM | POA: Diagnosis not present

## 2019-05-10 DIAGNOSIS — Z20828 Contact with and (suspected) exposure to other viral communicable diseases: Secondary | ICD-10-CM | POA: Diagnosis not present

## 2019-05-10 DIAGNOSIS — K219 Gastro-esophageal reflux disease without esophagitis: Secondary | ICD-10-CM | POA: Insufficient documentation

## 2019-05-10 DIAGNOSIS — I5032 Chronic diastolic (congestive) heart failure: Secondary | ICD-10-CM | POA: Diagnosis not present

## 2019-05-10 DIAGNOSIS — R410 Disorientation, unspecified: Secondary | ICD-10-CM | POA: Diagnosis not present

## 2019-05-10 DIAGNOSIS — Z79890 Hormone replacement therapy (postmenopausal): Secondary | ICD-10-CM | POA: Diagnosis not present

## 2019-05-10 DIAGNOSIS — I5030 Unspecified diastolic (congestive) heart failure: Secondary | ICD-10-CM | POA: Diagnosis present

## 2019-05-10 DIAGNOSIS — N1831 Chronic kidney disease, stage 3a: Secondary | ICD-10-CM

## 2019-05-10 LAB — URINALYSIS, ROUTINE W REFLEX MICROSCOPIC
Bilirubin Urine: NEGATIVE
Glucose, UA: NEGATIVE mg/dL
Hgb urine dipstick: NEGATIVE
Ketones, ur: NEGATIVE mg/dL
Nitrite: NEGATIVE
Protein, ur: 100 mg/dL — AB
Specific Gravity, Urine: 1.01 (ref 1.005–1.030)
WBC, UA: >50 WBC/hpf — ABNORMAL HIGH (ref 0–5)
pH: 7 (ref 5.0–8.0)

## 2019-05-10 LAB — COMPREHENSIVE METABOLIC PANEL
ALT: 16 U/L (ref 0–44)
AST: 20 U/L (ref 15–41)
Albumin: 3.6 g/dL (ref 3.5–5.0)
Alkaline Phosphatase: 35 U/L — ABNORMAL LOW (ref 38–126)
Anion gap: 13 (ref 5–15)
BUN: 23 mg/dL (ref 8–23)
CO2: 25 mmol/L (ref 22–32)
Calcium: 9.9 mg/dL (ref 8.9–10.3)
Chloride: 102 mmol/L (ref 98–111)
Creatinine, Ser: 1.04 mg/dL — ABNORMAL HIGH (ref 0.44–1.00)
GFR calc Af Amer: >60 mL/min (ref >60)
GFR calc non Af Amer: 52 mL/min — ABNORMAL LOW (ref >60)
Glucose, Bld: 129 mg/dL — ABNORMAL HIGH (ref 70–99)
Potassium: 3.8 mmol/L (ref 3.5–5.1)
Sodium: 140 mmol/L (ref 135–145)
Total Bilirubin: 0.7 mg/dL (ref 0.3–1.2)
Total Protein: 6.5 g/dL (ref 6.5–8.1)

## 2019-05-10 LAB — CBC
HCT: 37.7 % (ref 36.0–46.0)
Hemoglobin: 12.3 g/dL (ref 12.0–15.0)
MCH: 29.6 pg (ref 26.0–34.0)
MCHC: 32.6 g/dL (ref 30.0–36.0)
MCV: 90.6 fL (ref 80.0–100.0)
Platelets: 221 10*3/uL (ref 150–400)
RBC: 4.16 MIL/uL (ref 3.87–5.11)
RDW: 12.4 % (ref 11.5–15.5)
WBC: 8.2 10*3/uL (ref 4.0–10.5)
nRBC: 0 % (ref 0.0–0.2)

## 2019-05-10 LAB — MRSA PCR SCREENING: MRSA by PCR: POSITIVE — AB

## 2019-05-10 LAB — SARS CORONAVIRUS 2 (TAT 6-24 HRS): SARS Coronavirus 2: NEGATIVE

## 2019-05-10 LAB — ETHANOL: Alcohol, Ethyl (B): <10 mg/dL (ref <10)

## 2019-05-10 MED ORDER — PAROXETINE HCL 20 MG PO TABS
20.0000 mg | ORAL_TABLET | Freq: Every day | ORAL | Status: DC
  Administered 2019-05-11: 20 mg via ORAL
  Filled 2019-05-10: qty 1

## 2019-05-10 MED ORDER — ONDANSETRON HCL 4 MG PO TABS: 4.0000 mg | ORAL_TABLET | Freq: Four times a day (QID) | ORAL | Status: DC | PRN

## 2019-05-10 MED ORDER — SODIUM CHLORIDE 0.9 % IV SOLN
1.0000 g | INTRAVENOUS | Status: DC
  Administered 2019-05-11: 1 g via INTRAVENOUS
  Filled 2019-05-10: qty 10

## 2019-05-10 MED ORDER — STERILE WATER FOR INJECTION IJ SOLN
INTRAMUSCULAR | Status: AC
  Administered 2019-05-10: 1.2 mL
  Filled 2019-05-10: qty 10

## 2019-05-10 MED ORDER — AMLODIPINE BESYLATE 5 MG PO TABS
5.0000 mg | ORAL_TABLET | Freq: Every day | ORAL | Status: DC
  Administered 2019-05-11: 5 mg via ORAL
  Filled 2019-05-10 (×2): qty 1

## 2019-05-10 MED ORDER — HALOPERIDOL LACTATE 5 MG/ML IJ SOLN
5.0000 mg | Freq: Four times a day (QID) | INTRAMUSCULAR | Status: DC | PRN
  Administered 2019-05-10: 5 mg via INTRAVENOUS
  Filled 2019-05-10: qty 1

## 2019-05-10 MED ORDER — FENOFIBRATE 160 MG PO TABS
160.0000 mg | ORAL_TABLET | Freq: Every day | ORAL | Status: DC
  Administered 2019-05-11: 160 mg via ORAL
  Filled 2019-05-10: qty 1

## 2019-05-10 MED ORDER — HYDRALAZINE HCL 20 MG/ML IJ SOLN
10.0000 mg | Freq: Once | INTRAMUSCULAR | Status: AC
  Administered 2019-05-10: 10 mg via INTRAVENOUS
  Filled 2019-05-10: qty 1

## 2019-05-10 MED ORDER — DOCUSATE SODIUM 100 MG PO CAPS
100.0000 mg | ORAL_CAPSULE | Freq: Two times a day (BID) | ORAL | Status: DC
  Administered 2019-05-10: 100 mg via ORAL
  Filled 2019-05-10 (×2): qty 1

## 2019-05-10 MED ORDER — LORAZEPAM 2 MG/ML IJ SOLN
1.0000 mg | Freq: Once | INTRAMUSCULAR | Status: AC
  Administered 2019-05-10: 1 mg via INTRAVENOUS
  Filled 2019-05-10: qty 1

## 2019-05-10 MED ORDER — ONDANSETRON HCL 4 MG/2ML IJ SOLN: 4.0000 mg | Freq: Four times a day (QID) | INTRAMUSCULAR | Status: DC | PRN

## 2019-05-10 MED ORDER — SODIUM CHLORIDE 0.9% FLUSH
3.0000 mL | Freq: Once | INTRAVENOUS | Status: AC
  Administered 2019-05-10: 3 mL via INTRAVENOUS

## 2019-05-10 MED ORDER — SODIUM CHLORIDE 0.9 % IV SOLN
1.0000 g | Freq: Once | INTRAVENOUS | Status: AC
  Administered 2019-05-10: 1 g via INTRAVENOUS
  Filled 2019-05-10: qty 10

## 2019-05-10 MED ORDER — LEVOTHYROXINE SODIUM 25 MCG PO TABS
137.0000 ug | ORAL_TABLET | Freq: Every day | ORAL | Status: DC
  Administered 2019-05-11: 137 ug via ORAL
  Filled 2019-05-10: qty 1

## 2019-05-10 MED ORDER — LOSARTAN POTASSIUM 50 MG PO TABS
100.0000 mg | ORAL_TABLET | Freq: Every day | ORAL | Status: DC
  Administered 2019-05-11: 100 mg via ORAL
  Filled 2019-05-10: qty 2

## 2019-05-10 MED ORDER — ZIPRASIDONE MESYLATE 20 MG IM SOLR
10.0000 mg | Freq: Once | INTRAMUSCULAR | Status: AC
  Administered 2019-05-10: 10 mg via INTRAMUSCULAR
  Filled 2019-05-10: qty 20

## 2019-05-10 MED ORDER — HYDRALAZINE HCL 20 MG/ML IJ SOLN
10.0000 mg | INTRAMUSCULAR | Status: DC | PRN
  Administered 2019-05-10 – 2019-05-11 (×2): 10 mg via INTRAVENOUS
  Filled 2019-05-10 (×2): qty 1

## 2019-05-10 MED ORDER — TRAMADOL HCL 50 MG PO TABS: 50.0000 mg | ORAL_TABLET | Freq: Four times a day (QID) | ORAL | Status: DC | PRN

## 2019-05-10 MED ORDER — CLONAZEPAM 0.5 MG PO TABS: 2.0000 mg | ORAL_TABLET | Freq: Three times a day (TID) | ORAL | Status: DC | PRN

## 2019-05-10 MED ORDER — ZOLPIDEM TARTRATE 5 MG PO TABS
5.0000 mg | ORAL_TABLET | Freq: Every day | ORAL | Status: DC
  Administered 2019-05-10: 21:00:00 5 mg via ORAL
  Filled 2019-05-10: qty 1

## 2019-05-10 MED ORDER — ENOXAPARIN SODIUM 40 MG/0.4ML ~~LOC~~ SOLN: 40.0000 mg | SUBCUTANEOUS | Status: DC

## 2019-05-10 MED ORDER — ASPIRIN 81 MG PO CHEW
81.0000 mg | CHEWABLE_TABLET | Freq: Every day | ORAL | Status: DC
  Administered 2019-05-11: 81 mg via ORAL
  Filled 2019-05-10: qty 1

## 2019-05-10 NOTE — ED Notes (Signed)
L leg removed from soft restraint for trial basis

## 2019-05-10 NOTE — ED Triage Notes (Addendum)
Pt to triage via [facility] from home.  Family called EMS for AMS.  Pt alert and oriented.  Occasionally has comments such as raining last night.  Very hard of hearing.  Pt reports slipping on water last Thursday and hurt L hip and L hand. Family was unaware of fall.  Pt ambulatory with assistance.  BP 260/118.  Recent change with BP medication.  CBG 129.

## 2019-05-10 NOTE — ED Notes (Signed)
Pt becoming increasingly anxious and agitated, asking to go home, crying ans screaming at staff. No longer to redirect with conversation or walks to restroom, admitting MD contacted, new orders placed.

## 2019-05-10 NOTE — ED Notes (Signed)
Nurse Navigator spoke with daughter and daughter is concerned about pt using too much of her Paxil and Ambien.  They also would like a psych eval.  Advised her that we are treating her mother for UTI and we are attributing her mental status changes to this but that I will make a note on the chart.  She asked that her mothers phone will be moved as she is confused and making calls to family which is very confusing to them (ex: call to family saying she was ready to be picked up when she is in fact being admitted)

## 2019-05-10 NOTE — ED Notes (Signed)
Took patient to the bathroom patient did fine  

## 2019-05-10 NOTE — ED Notes (Signed)
Pt oriented x4 but states " that girl is driving me crazy out there, the one from the birthday party" and sometimes appears confused when directed to perform some ADLs.

## 2019-05-10 NOTE — ED Notes (Signed)
Meal tray provided.
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
## 2019-05-10 NOTE — ED Notes (Signed)
Pt noted to be attempting climb over railings of bed, attempted to speak to pt about not climbing off of bed this way and to return to safe position, pt began trying to pinch and bite this RN, when additional staff tried to redirect pt began kicking and screaming and spitting and biting and slapping at staff. Soft restraints applied to protect staff and pt. Geodon given perEDP and orders for restraints requested.

## 2019-05-10 NOTE — ED Notes (Signed)
Wheeled patient to the bathroom patient did well p[atient is now back in bed on the monitor and call bell in reach

## 2019-05-10 NOTE — ED Notes (Signed)
Ordered diet tray 
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
## 2019-05-10 NOTE — H&P (Signed)
History and Physical    Kimberly [name] WNU:[id number] DOB: 04/25/1941 DOA: 05/10/2019  PCP: Clayborn Heron, MD Consultants:  Elvera Lennox - endocrinology; Turner - cardiology; Young - pulmonology Patient coming from:  Home; NOK: Daughters, [name], [phone]  Chief Complaint: AMS  HPI: Kimberly [name] is a 78 y.o. female with medical history significant of OSA on CPAP; HLD; diet-controlled DM; hypercalcemia; postsurgical hypothyroidism; and chronic diastolic CHF presenting with AMS. The patient was very agitated at the time of my evaluation.  She reported that she just wanted [place] to "take this thing out!" so that she could leave.  She refused to answer questions and was willing to allow me only a limited evaluation.  She continuously stated that she wanted to leave, and yet wouldn't/couldn't answer orientation questions.  She did not tell me hallucinatory thoughts but these have been reported by ER staff.  She has intermittently been quite agitated in the ER, attempting to hit, kick, and bite staff.  She is currently in soft wrist restraints.  I spoke with her daughter.  They noticed late last week that she was sleeping a lot, didn't want to get out of bed.  She was quite confused when she did get up - unsure where she was, having visual hallucinations.  This happened overnight and this AM and so they brought her in.  Her daughter thinks she "goes through spells".  Her husband has dementia and has hallucinations when he is dehydrated.  They thought that maybe she was going along with him about the hallucinations to get attention.  This was very different and so they thought it might be medication related.  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  They are starting to wonder if she has dementia too, although not like this until this episode.  She was combative with her husband overnight.   ED Course:  Either UTI or hypertensive encephalopathy.  Lots of falls, dwindling.  Hallucinations.  BP 200-/.  Has  allergies to anti-HTN meds - given hydralazine.  UA also abnormal.  Review of Systems: Unable to perform    Past Medical History:  Diagnosis Date  . Acid reflux   . Anxiety   . Arthritis   . Back spasm   . Bradycardia    a. junctional and sinus brady during admission 06/2017 requiring cessation of beta blocker and clonidine.  . Chronic diastolic heart failure (HCC) 03/20/2015  . Chronic edema   . Depression   . Diabetes mellitus without complication (HCC)    diet controled  . Fuch's endothelial dystrophy    bilateral eyes  . Headache    occ  . HOH (hard of hearing)    wears hearing aid in left ear  . Hypercholesteremia   . Hypertensive heart disease with CHF (HCC) 09/11/2014  . Insomnia   . Junctional bradycardia   . Mild mitral regurgitation   . Mild tricuspid regurgitation   . Normal coronary arteries 2010   cath with no obstructive epicardial disease.  Done for CP which was felt to be due to microvascular angina or diastolic HF  . OSA on CPAP    Followed by Dr. Fannie Knee    Past Surgical History:  Procedure Laterality Date  . ABDOMINAL HYSTERECTOMY    . APPENDECTOMY    . CARDIAC CATHETERIZATION     no CAD  . CESAREAN SECTION     x 2  . CHOLECYSTECTOMY    . COLONOSCOPY    . EYE SURGERY Right  implant  . ROTATOR CUFF REPAIR Left   . SHOULDER ARTHROSCOPY WITH SUBACROMIAL DECOMPRESSION AND BICEP TENDON REPAIR Left 08/24/2012   Procedure: SHOULDER ARTHROSCOPY WITH SUBACROMIAL DECOMPRESSION AND BICEP TENDON REPAIR;  Surgeon: Meredith Pel, MD;  Location: [facility];  Service: Orthopedics;  Laterality: Left;  Left Shoulder Arthroscopy, Debridement, Biceps Tenotomy  . THYROIDECTOMY N/A 06/12/2015   Procedure: TOTAL THYROIDECTOMY;  Surgeon: Armandina Gemma, MD;  Location: [facility];  Service: General;  Laterality: N/A;    Social History   Socioeconomic History  . Marital status: Married    Spouse name: Not on file  . Number of children: 2  . Years of education: Not on  file  . Highest education level: Not on file  Occupational History  . Occupation: Secondary school teacher  Tobacco Use  . Smoking status: Never Smoker  . Smokeless tobacco: Never Used  Substance and Sexual Activity  . Alcohol use: No    Alcohol/week: 0.0 standard drinks  . Drug use: No  . Sexual activity: Not on file  Other Topics Concern  . Not on file  Social History Narrative  . Not on file   Social Determinants of Health   Financial Resource Strain:   . Difficulty of Paying Living Expenses: Not on file  Food Insecurity:   . Worried About Charity fundraiser in the Last Year: Not on file  . Ran Out of Food in the Last Year: Not on file  Transportation Needs:   . Lack of Transportation (Medical): Not on file  . Lack of Transportation (Non-Medical): Not on file  Physical Activity:   . Days of Exercise per Week: Not on file  . Minutes of Exercise per Session: Not on file  Stress:   . Feeling of Stress : Not on file  Social Connections:   . Frequency of Communication with Friends and Family: Not on file  . Frequency of Social Gatherings with Friends and Family: Not on file  . Attends Religious Services: Not on file  . Active Member of Clubs or Organizations: Not on file  . Attends Archivist Meetings: Not on file  . Marital Status: Not on file  Intimate Partner Violence:   . Fear of Current or Ex-Partner: Not on file  . Emotionally Abused: Not on file  . Physically Abused: Not on file  . Sexually Abused: Not on file    Allergies  Allergen Reactions  . Percocet [Oxycodone-Acetaminophen] Shortness Of Breath and Other (See Comments)    Pt couldn't breathe or catch her breath  . Codeine Other (See Comments)    hyperactivity  . Other Other (See Comments)    Some pain medication given at [facility] for surgery caused hallucinations   . Phenergan [Promethazine Hcl] Other (See Comments)    Hallucinations   . Clonidine Derivatives     Bradycardia  (06/2017)  . Hydralazine     Headache/dizzy  . Metoprolol     Bradycardia 06/2017     Family History  Problem Relation Age of Onset  . Stomach cancer Mother   . Cancer Sister        intestinal    Prior to Admission medications   Medication Sig Start Date End Date Taking? Authorizing Provider  amLODipine (NORVASC) 5 MG tablet Take 1 tablet (5 mg total) by mouth daily. 07/05/18 07/05/19  Sueanne Margarita, MD  amoxicillin (AMOXIL) 500 MG capsule Use as directed 12/01/18   [provider]  aspirin 81 MG chewable tablet  Chew 81 mg by mouth daily.    [provider]  Cholecalciferol (VITAMIN D3) 5000 units CAPS Take 5,000 Units by mouth daily.    [provider]  clonazePAM (KLONOPIN) 1 MG tablet TAKE 1 4 TO 1 TABLET BY MOUTH EVERY 12 HOURS AS NEEDED FOR ANXIETY 03/30/18   [provider]  fenofibrate micronized (LOFIBRA) 200 MG capsule Take 200 mg by mouth daily before breakfast.    [provider]  furosemide (LASIX) 20 MG tablet TAKE 1 TABLET BY MOUTH ONCE DAILY 02/16/18   Quintella Reichert, MD  levothyroxine (SYNTHROID) 137 MCG tablet TAKE 1 TABLET BY MOUTH ONCE DAILY BEFORE BREAKFAST 12/20/18   Carlus Pavlov, MD  Lido-Capsaicin-Men-Methyl Sal (SOOTHEE EX) Apply 1 drop topically daily. Eye drops    [provider]  losartan (COZAAR) 100 MG tablet Take 1 tablet (100 mg total) by mouth daily. 02/21/19 05/22/19  Dunn, Tacey Ruiz, PA-C  nitroGLYCERIN (NITROSTAT) 0.4 MG SL tablet Place 1 tablet (0.4 mg total) under the tongue every 5 (five) minutes as needed for chest pain. 06/23/17   Bhagat, Sharrell Ku, PA  PARoxetine (PAXIL) 20 MG tablet Take 20 mg by mouth daily.    [provider]  prednisoLONE acetate (PRED FORTE) 1 % ophthalmic suspension Use as directed 01/18/19   [provider]  traMADol (ULTRAM) 50 MG tablet Take 1-2 tablets (50-100 mg total) by mouth every 6 (six) hours as needed for moderate pain or severe pain. 06/13/15    Darnell Level, MD  zolpidem (AMBIEN) 10 MG tablet Take 5 mg by mouth at bedtime. For sleep    [provider]    Physical Exam: Vitals:   05/10/19 1645 05/10/19 1654 05/10/19 1700 05/10/19 1800  BP:  (!) 236/90 (!) 207/84 (!) 225/91  Pulse: 92 85 81   Resp: (!) 23 (!) 29 (!) 23 (!) 33  Temp:      TempSrc:      SpO2: 98% 96% 96%      . General:  Appears agitated, cantankerous, but in NAD . Eyes:  PERRL, EOMI, normal lids, iris . ENT:  grossly normal hearing, lips & tongue, mmm . Neck:  no visible masses . Cardiovascular:  RRR, no m/r/g. No LE edema.  Marland Kitchen Respiratory:   CTA bilaterally with no wheezes/rales/rhonchi.  Normal respiratory effort. . Abdomen:  soft, NT, ND, NABS . Skin:  no rash or induration seen on limited exam . Musculoskeletal:  grossly normal tone BUE/BLE, good ROM, no bony abnormality . Psychiatric:  Agitated, uncooperative, not clearly responding to internal stimuli but clearly unhappy about the idea of hospitalization . Neurologic:  Unable to effectively perform    Radiological Exams on Admission: DG Chest 2 View  Result Date: 05/10/2019 CLINICAL DATA:  Altered mental status, fall EXAM: CHEST - 2 VIEW COMPARISON:  06/12/2017 FINDINGS: Lungs are clear.  No pleural effusion or pneumothorax. Cardiomegaly. Mild degenerative changes of the visualized thoracolumbar spine. IMPRESSION: Normal chest radiographs. Electronically Signed   By: Charline Bills M.D.   On: 05/10/2019 09:23   CT Head Wo Contrast  Result Date: 05/10/2019 CLINICAL DATA:  Head trauma, minor. EXAM: CT HEAD WITHOUT CONTRAST TECHNIQUE: Contiguous axial images were obtained from the base of the skull through the vertex without intravenous contrast. COMPARISON:  Brain MRI 08/25/2014, head CT 08/25/2014 FINDINGS: Brain: No evidence of acute intracranial hemorrhage. No demarcated cortical infarction. No evidence of intracranial mass. No midline shift or extra-axial fluid collection. Mild  ill-defined hypoattenuation within the  subcortical and deep white matter is nonspecific, but consistent with chronic small vessel ischemic disease. Cerebral volume is normal for age. Vascular: No hyperdense vessel. Atherosclerotic calcifications. Skull: Normal. Negative for fracture or focal lesion. Sinuses/Orbits: Visualized orbits demonstrate no acute abnormality. No significant paranasal sinus disease or mastoid effusion at the imaged levels. IMPRESSION: No evidence of acute intracranial abnormality. Mild chronic small vessel ischemic disease. Electronically Signed   By: Jackey LogeKyle  Golden DO   On: 05/10/2019 10:10   DG Hip Unilat With Pelvis 2-3 Views Left  Result Date: 05/10/2019 CLINICAL DATA:  Fall, hip pain EXAM: DG HIP (WITH OR WITHOUT PELVIS) 2-3V LEFT COMPARISON:  None. FINDINGS: No fracture or dislocation is seen. Bowel hip joint spaces are preserved. Mild degenerative changes of the lower lumbar spine. Surgical clips overlying the pelvis. IMPRESSION: Negative. Electronically Signed   By: Charline BillsSriyesh  Krishnan M.D.   On: 05/10/2019 09:22    EKG: Independently reviewed.  NSR with rate 64; LVH;  no evidence of acute ischemia   Labs on Admission: I have personally reviewed the available labs and imaging studies at the time of the admission.  Pertinent labs:   Glucose 129 BUN 23/Creatinine 1.04/GFR 52 - stable Normal CBC UA: large LE, 100 protein, rare bacteria, WBC >50 ETOH <10 TSH 15.7 on 10/12; 6.11 on 10/12   Assessment/Plan Principal Problem:   AMS (altered mental status) Active Problems:   Chronic diastolic heart failure (HCC)   Postsurgical hypothyroidism   Chronic kidney disease, stage 3a   Accelerated hypertension    AMS -Patient with not previously diagnosed but possible progressive dementia presenting with AMS with confusion, hallucinations, delusions -She was not inclined to talk with me and only reluctantly allowed me to examine her in a limited fashion -Evaluation thus  far unremarkable other than markedly uncontrolled HTN and mildly abnormal UA -She was given Rocephin in the ER and will continue for now, but fairly low suspicion for this as the cause -Hypertensive encephalopathy seems more likely but still would be an atypical presentation -Will observe on telemetry -given still uncontrolled BP and patient behavior, progressive care appears more appropriate at this time and so order changed accordingly -Her family suspected medication misadventure, likely associated with Prozac and Ambien, although this would also be atypical  -TTS consult was requested for capacity but they requested inpatient psychiatric consultation instead and so this was ordered -50% of patients with delirium while hospitalized will be institutionalized at 6 months, and these patients have a 25% mortality at 6 months -The family would benefit from being referred to the [facility] and also provided with the [employer] website -She is currently in soft wrist restraints due to aggressive behavior and will need a 1:1 sitter  Accelerated HTN -Will reorder home PO medications in attempt to control her BP -She has had very poor control while in the ER and has multiple medication allergies, further complicating the situation -Will continue prn IV hydralazine for SBP >180 -Will give Norvasc now and Cozaar in AM  Chronic diastolic CHF -06/2017 echo with preserved EF and grade 2 diastolic dysfunction -Appears to be compensated at this time -Will hold Lasix for now  Postsurgical hypothyroidism -TSH is improving  -Continue Synthroid at current dose for now  Stage 3a CKD -Appears to be at baseline at this time -Recheck in AM     Note: This patient has been tested and is pending for the novel coronavirus COVID-19.  DVT prophylaxis:  Lovenox  Code Status:  Full - confirmed with patient Family Communication: None present; I spoke with her daughter by telephone Disposition  Plan:  Home once clinically improved Consults called: Psych  Admission status: It is my clinical opinion that referral for OBSERVATION is reasonable and necessary in this patient based on the above information provided. The aforementioned taken together are felt to place the patient at high risk for further clinical deterioration. However it is anticipated that the patient may be medically stable for discharge from the hospital within 24 to 48 hours.    Jonah Blue MD [facility]   How to contact the [facility] Attending or Consulting provider 7A - 7P or covering provider during after hours 7P -7A, for this patient?  1. Check the care team in [facility] and look for a) attending/consulting TR[facility] provider listed and b) the [facility] team listed 2. Log into [email or web address] and use [place]'s universal password to access. If you do not have the password, please contact the hospital operator. 3. Locate the [facility] provider you are looking for under [facility] and page to a number that you can be directly reached. 4. If you still have difficulty reaching the provider, please page the [facility] (Director on Call) for the Hospitalists listed on amion for assistance.   05/10/2019, 6:32 PM

## 2019-05-10 NOTE — ED Notes (Signed)
TTS consult placed per admitting MD request

## 2019-05-10 NOTE — BH Assessment (Signed)
[place] Assessment Progress Note    TTS consult is being discontinued because it is out of the scope for TTS to determine a patient's capacity to make decisions.  A Psych Consult will need to be ordered for a [facility] Provider to see patient and not TTS.

## 2019-05-11 DIAGNOSIS — N1831 Chronic kidney disease, stage 3a: Secondary | ICD-10-CM | POA: Diagnosis not present

## 2019-05-11 DIAGNOSIS — I10 Essential (primary) hypertension: Secondary | ICD-10-CM | POA: Diagnosis not present

## 2019-05-11 DIAGNOSIS — I5032 Chronic diastolic (congestive) heart failure: Secondary | ICD-10-CM | POA: Diagnosis not present

## 2019-05-11 DIAGNOSIS — R410 Disorientation, unspecified: Secondary | ICD-10-CM | POA: Diagnosis not present

## 2019-05-11 LAB — CBC
HCT: 36.5 % (ref 36.0–46.0)
Hemoglobin: 12.2 g/dL (ref 12.0–15.0)
MCH: 29.8 pg (ref 26.0–34.0)
MCHC: 33.4 g/dL (ref 30.0–36.0)
MCV: 89 fL (ref 80.0–100.0)
Platelets: 238 10*3/uL (ref 150–400)
RBC: 4.1 MIL/uL (ref 3.87–5.11)
RDW: 12.6 % (ref 11.5–15.5)
WBC: 8.4 10*3/uL (ref 4.0–10.5)
nRBC: 0 % (ref 0.0–0.2)

## 2019-05-11 LAB — BASIC METABOLIC PANEL
Anion gap: 11 (ref 5–15)
BUN: 19 mg/dL (ref 8–23)
CO2: 26 mmol/L (ref 22–32)
Calcium: 9.2 mg/dL (ref 8.9–10.3)
Chloride: 106 mmol/L (ref 98–111)
Creatinine, Ser: 1.12 mg/dL — ABNORMAL HIGH (ref 0.44–1.00)
GFR calc Af Amer: 55 mL/min — ABNORMAL LOW (ref >60)
GFR calc non Af Amer: 47 mL/min — ABNORMAL LOW (ref >60)
Glucose, Bld: 112 mg/dL — ABNORMAL HIGH (ref 70–99)
Potassium: 3.4 mmol/L — ABNORMAL LOW (ref 3.5–5.1)
Sodium: 143 mmol/L (ref 135–145)

## 2019-05-11 MED ORDER — CEFDINIR 300 MG PO CAPS: 300.0000 mg | ORAL_CAPSULE | Freq: Two times a day (BID) | ORAL | 0 refills | Status: AC

## 2019-05-11 MED ORDER — QUETIAPINE FUMARATE 25 MG PO TABS: 25.0000 mg | ORAL_TABLET | Freq: Every day | ORAL | 2 refills | Status: DC

## 2019-05-11 MED ORDER — POTASSIUM CHLORIDE CRYS ER 20 MEQ PO TBCR
40.0000 meq | EXTENDED_RELEASE_TABLET | Freq: Once | ORAL | Status: AC
  Administered 2019-05-11: 40 meq via ORAL
  Filled 2019-05-11: qty 2

## 2019-05-11 MED ORDER — ALUM & MAG HYDROXIDE-SIMETH 200-200-20 MG/5ML PO SUSP: 30.0000 mL | Freq: Four times a day (QID) | ORAL | Status: DC | PRN

## 2019-05-11 NOTE — Plan of Care (Signed)
  Problem: Activity: Goal: Risk for activity intolerance will decrease 05/11/2019 1441 by Domingo Sep, RN Outcome: Adequate for Discharge 05/11/2019 1238 by Domingo Sep, RN Outcome: Progressing   Problem: Nutrition: Goal: Adequate nutrition will be maintained 05/11/2019 1441 by Domingo Sep, RN Outcome: Adequate for Discharge 05/11/2019 1238 by Domingo Sep, RN Outcome: Progressing   Problem: Elimination: Goal: Will not experience complications related to bowel motility Outcome: Adequate for Discharge Goal: Will not experience complications related to urinary retention Outcome: Adequate for Discharge   Problem: Pain Managment: Goal: General experience of comfort will improve Outcome: Completed/Met   Problem: Safety: Goal: Ability to remain free from injury will improve 05/11/2019 1441 by Domingo Sep, RN Outcome: Adequate for Discharge 05/11/2019 1238 by Domingo Sep, RN Outcome: Progressing   Problem: Skin Integrity: Goal: Risk for impaired skin integrity will decrease Outcome: Completed/Met   Discharge instructions given to patient. Questions answered. Educated on new medication regimen, signs/symptoms, restrictions, and follow up appointments. Prescriptions sent to [facility] PIV DC, hemostasis achieved. Vital signs stable. All belongings sent home with patient.   Pt escorted by NT via wheelchair to private vehicle driven by family.

## 2019-05-11 NOTE — Consult Note (Signed)
Telepsych Consultation  "Capacity" Reason for Consult: "Capacity"  Referring Physician:  Dr Tyson Babinski Location of Patient: [facility] Location of Provider: [facility]  Patient Identification: Kimberly [name] MRN:  [id number] Principal Diagnosis: AMS (altered mental status) Diagnosis:  Principal Problem:   AMS (altered mental status) Active Problems:   Chronic diastolic heart failure (HCC)   Postsurgical hypothyroidism   Chronic kidney disease, stage 3a   Accelerated hypertension   Total Time spent with patient: 30 minutes  Subjective:   Kimberly [name] is a 78 y.o. female patient admitted with "altered mental status."  Patient assessed by nurse practitioner.  Patient alert and oriented.  Patient denies suicidal and homicidal ideations.  Patient denies history of self-harm.  Patient denies auditory and visual hallucinations.  Patient reports "husband suffers from dementia and sometimes sees and hears things that I do not see."  Patient appears to have difficulty hearing, patient gives verbal consent to collect collateral information from daughter, [name] [phone]. Per [name], daughter: Patient has no history of self-harm no history of hallucinations.  [name] denies concern for patient safety. Per [name] approximately 2 days ago patient became quiet and "not herself."  Per [name] "last night my dad called me to the house because she my mom was confused and attempted to leave the house to go home."  [name] denies any bizarre behavior prior to last night.  [name] denies any history of hallucinations.  He denies any weapons in her parents home.  Per [name] patient is primary caregiver for her husband who has dementia and is becoming frail."  [name] expresses concern regarding mother's Ambien, states "over the last few days she appeared to be sleeping more than usual." Case discussed with Dr. Lucianne Muss.   HPI: Patient admitted with agitation, patient currently treated for  urinary tract infection.  Past Psychiatric History: Depression  Risk to Self:  No Risk to Others:  No Prior Inpatient Therapy:  No Prior Outpatient Therapy:  No  Past Medical History:  Past Medical History:  Diagnosis Date  . Acid reflux   . Anxiety   . Arthritis   . Back spasm   . Bradycardia    a. junctional and sinus brady during admission 06/2017 requiring cessation of beta blocker and clonidine.  . Chronic diastolic heart failure (HCC) 03/20/2015  . Chronic edema   . Depression   . Diabetes mellitus without complication (HCC)    diet controled  . Fuch's endothelial dystrophy    bilateral eyes  . Headache    occ  . HOH (hard of hearing)    wears hearing aid in left ear  . Hypercholesteremia   . Hypertensive heart disease with CHF (HCC) 09/11/2014  . Insomnia   . Junctional bradycardia   . Mild mitral regurgitation   . Mild tricuspid regurgitation   . Normal coronary arteries 2010   cath with no obstructive epicardial disease.  Done for CP which was felt to be due to microvascular angina or diastolic HF  . OSA on CPAP    Followed by Dr. Fannie Knee    Past Surgical History:  Procedure Laterality Date  . ABDOMINAL HYSTERECTOMY    . APPENDECTOMY    . CARDIAC CATHETERIZATION     no CAD  . CESAREAN SECTION     x 2  . CHOLECYSTECTOMY    . COLONOSCOPY    . EYE SURGERY Right    implant  . ROTATOR CUFF REPAIR Left   . SHOULDER ARTHROSCOPY  WITH SUBACROMIAL DECOMPRESSION AND BICEP TENDON REPAIR Left 08/24/2012   Procedure: SHOULDER ARTHROSCOPY WITH SUBACROMIAL DECOMPRESSION AND BICEP TENDON REPAIR;  Surgeon: Meredith Pel, MD;  Location: [facility];  Service: Orthopedics;  Laterality: Left;  Left Shoulder Arthroscopy, Debridement, Biceps Tenotomy  . THYROIDECTOMY N/A 06/12/2015   Procedure: TOTAL THYROIDECTOMY;  Surgeon: Armandina Gemma, MD;  Location: [facility] OR;  Service: General;  Laterality: N/A;   Family History:  Family History  Problem Relation Age of Onset  . Stomach  cancer Mother   . Cancer Sister        intestinal   Family Psychiatric  History: Denies Social History:  Social History   Substance and Sexual Activity  Alcohol Use No  . Alcohol/week: 0.0 standard drinks     Social History   Substance and Sexual Activity  Drug Use No    Social History   Socioeconomic History  . Marital status: Married    Spouse name: Not on file  . Number of children: 2  . Years of education: Not on file  . Highest education level: Not on file  Occupational History  . Occupation: Secondary school teacher  Tobacco Use  . Smoking status: Never Smoker  . Smokeless tobacco: Never Used  Substance and Sexual Activity  . Alcohol use: No    Alcohol/week: 0.0 standard drinks  . Drug use: No  . Sexual activity: Not on file  Other Topics Concern  . Not on file  Social History Narrative  . Not on file   Social Determinants of Health   Financial Resource Strain:   . Difficulty of Paying Living Expenses: Not on file  Food Insecurity:   . Worried About Charity fundraiser in the Last Year: Not on file  . Ran Out of Food in the Last Year: Not on file  Transportation Needs:   . Lack of Transportation (Medical): Not on file  . Lack of Transportation (Non-Medical): Not on file  Physical Activity:   . Days of Exercise per Week: Not on file  . Minutes of Exercise per Session: Not on file  Stress:   . Feeling of Stress : Not on file  Social Connections:   . Frequency of Communication with Friends and Family: Not on file  . Frequency of Social Gatherings with Friends and Family: Not on file  . Attends Religious Services: Not on file  . Active Member of Clubs or Organizations: Not on file  . Attends Archivist Meetings: Not on file  . Marital Status: Not on file   Additional Social History:    Allergies:   Allergies  Allergen Reactions  . Percocet [Oxycodone-Acetaminophen] Shortness Of Breath and Other (See Comments)    Pt couldn't breathe or  catch her breath  . Codeine Other (See Comments)    hyperactivity  . Other Other (See Comments)    Some pain medication given at [facility] for surgery caused hallucinations   . Phenergan [Promethazine Hcl] Other (See Comments)    Hallucinations   . Clonidine Derivatives     Bradycardia (06/2017)  . Hydralazine     Headache/dizzy  . Metoprolol     Bradycardia 06/2017     Labs:  Results for orders placed or performed during the hospital encounter of 05/10/19 (from the past 48 hour(s))  Comprehensive metabolic panel     Status: Abnormal   Collection Time: 05/10/19  7:53 AM  Result Value Ref Range   Sodium 140 135 - 145 mmol/L  Potassium 3.8 3.5 - 5.1 mmol/L   Chloride 102 98 - 111 mmol/L   CO2 25 22 - 32 mmol/L   Glucose, Bld 129 (H) 70 - 99 mg/dL   BUN 23 8 - 23 mg/dL   Creatinine, Ser 1.61 (H) 0.44 - 1.00 mg/dL   Calcium 9.9 8.9 - 09.6 mg/dL   Total Protein 6.5 6.5 - 8.1 g/dL   Albumin 3.6 3.5 - 5.0 g/dL   AST 20 15 - 41 U/L   ALT 16 0 - 44 U/L   Alkaline Phosphatase 35 (L) 38 - 126 U/L   Total Bilirubin 0.7 0.3 - 1.2 mg/dL   GFR calc non Af Amer 52 (L) >60 mL/min   GFR calc Af Amer >60 >60 mL/min   Anion gap 13 5 - 15    Comment: Performed at [facility], [address], [place], [place] [address]  CBC     Status: None   Collection Time: 05/10/19  7:53 AM  Result Value Ref Range   WBC 8.2 4.0 - 10.5 K/uL   RBC 4.16 3.87 - 5.11 MIL/uL   Hemoglobin 12.3 12.0 - 15.0 g/dL   HCT 98.1 19.1 - 47.8 %   MCV 90.6 80.0 - 100.0 fL   MCH 29.6 26.0 - 34.0 pg   MCHC 32.6 30.0 - 36.0 g/dL   RDW 29.5 62.1 - 30.8 %   Platelets 221 150 - 400 K/uL   nRBC 0.0 0.0 - 0.2 %    Comment: Performed at [facility], [address], [place], [place] [address]  Ethanol     Status: None   Collection Time: 05/10/19  8:56 AM  Result Value Ref Range   Alcohol, Ethyl (B) <10 <10 mg/dL    Comment: (NOTE) Lowest detectable limit for serum alcohol is 10 mg/dL. For medical  purposes only. Performed at [facility], [address] [place], [place] [address]   Urinalysis, Routine w reflex microscopic     Status: Abnormal   Collection Time: 05/10/19  1:32 PM  Result Value Ref Range   Color, Urine YELLOW YELLOW   APPearance CLOUDY (A) CLEAR   Specific Gravity, Urine 1.010 1.005 - 1.030   pH 7.0 5.0 - 8.0   Glucose, UA NEGATIVE NEGATIVE mg/dL   Hgb urine dipstick NEGATIVE NEGATIVE   Bilirubin Urine NEGATIVE NEGATIVE   Ketones, ur NEGATIVE NEGATIVE mg/dL   Protein, ur 528 (A) NEGATIVE mg/dL   Nitrite NEGATIVE NEGATIVE   Leukocytes,Ua LARGE (A) NEGATIVE   RBC / HPF 0-5 0 - 5 RBC/hpf   WBC, UA >50 (H) 0 - 5 WBC/hpf   Bacteria, UA RARE (A) NONE SEEN   WBC Clumps PRESENT    Mucus PRESENT    Budding Yeast PRESENT    Hyaline Casts, UA PRESENT     Comment: Performed at [facility], [address] [place], [place] [address]  Urine culture     Status: Abnormal (Preliminary result)   Collection Time: 05/10/19  1:32 PM   Specimen: Urine, Random  Result Value Ref Range   Specimen Description URINE, RANDOM    Special Requests      NONE Performed at [facility], [address] [place], [place] [address]    Culture >=100,000 COLONIES/mL GRAM NEGATIVE RODS (A)    Report Status PENDING   SARS CORONAVIRUS 2 (TAT 6-24 HRS) Nasopharyngeal Nasopharyngeal Swab     Status: None   Collection Time: 05/10/19  2:40 PM   Specimen: Nasopharyngeal Swab  Result Value Ref Range   SARS Coronavirus 2 NEGATIVE NEGATIVE    Comment: (NOTE) SARS-CoV-2 target nucleic acids are NOT DETECTED. The SARS-CoV-2 RNA is generally detectable in upper and lower respiratory specimens during the acute phase of infection. Negative results do not preclude SARS-CoV-2 infection, do not rule out co-infections with other pathogens, and should not be used as the sole basis for treatment or other patient management decisions. Negative results must be combined with clinical  observations, patient history, and epidemiological information. The expected result is Negative. Fact Sheet for Patients: HairSlick.nohttps://www.fda.gov/media/138098/download Fact Sheet for Healthcare Providers: quierodirigir.comhttps://www.fda.gov/media/138095/download This test is not yet approved or cleared by the [place] FDA and  has been authorized for detection and/or diagnosis of SARS-CoV-2 by FDA under an Emergency Use Authorization (EUA). This EUA will remain  in effect (meaning this test can be used) for the duration of the COVID-19 declaration under Section 56 4(b)(1) of the Act, 21 U.S.C. section 360bbb-3(b)(1), unless the authorization is terminated or revoked sooner. Performed at [facility], [address] [place], [place] [address]   MRSA PCR Screening     Status: Abnormal   Collection Time: 05/10/19  8:14 PM   Specimen: Nasal Mucosa; Nasopharyngeal  Result Value Ref Range   MRSA by PCR POSITIVE (A) NEGATIVE    Comment:        The GeneXpert MRSA Assay (FDA approved for NASAL specimens only), is one component of a comprehensive MRSA colonization surveillance program. It is not intended to diagnose MRSA infection nor to guide or monitor treatment for MRSA infections. RESULT CALLED TO, READ BACK BY AND VERIFIED WITH: Leonette MostJ MILLER RN 05/10/19 2320 JDW Performed at [facility], [address] [place], [place] [address]   Basic metabolic panel     Status: Abnormal   Collection Time: 05/11/19  2:40 AM  Result Value Ref Range   Sodium 143 135 - 145 mmol/L   Potassium 3.4 (L) 3.5 - 5.1 mmol/L   Chloride 106 98 - 111 mmol/L   CO2 26 22 - 32 mmol/L   Glucose, Bld 112 (H) 70 - 99 mg/dL   BUN 19 8 - 23 mg/dL   Creatinine, Ser 0.981.12 (H) 0.44 - 1.00 mg/dL   Calcium 9.2 8.9 - 11.910.3 mg/dL   GFR calc non Af Amer 47 (L) >60 mL/min   GFR calc Af Amer 55 (L) >60 mL/min   Anion gap 11 5 - 15    Comment: Performed at [facility], [address] [place], [place] [address]  CBC      Status: None   Collection Time: 05/11/19  2:40 AM  Result Value Ref Range   WBC 8.4 4.0 - 10.5 K/uL   RBC 4.10 3.87 - 5.11 MIL/uL   Hemoglobin 12.2 12.0 - 15.0 g/dL   HCT 95.636.5 21.336.0 - 08.646.0 %   MCV 89.0 80.0 - 100.0 fL   MCH 29.8 26.0 - 34.0 pg   MCHC 33.4 30.0 - 36.0 g/dL   RDW 57.812.6 46.911.5 - 62.915.5 %   Platelets 238 150 - 400 K/uL   nRBC 0.0 0.0 - 0.2 %    Comment: Performed at [facility], [address] [place], [place] [address]    Medications:  Current Facility-Administered Medications  Medication Dose Route Frequency Provider Last Rate Last Admin  . amLODipine (NORVASC) tablet 5 mg  5 mg Oral Daily Jonah BlueYates, Jennifer, MD  5 mg at 05/11/19 0806  . aspirin chewable tablet 81 mg  81 mg Oral Daily Jonah Blue, MD   81 mg at 05/11/19 4132  . cefTRIAXone (ROCEPHIN) 1 g in sodium chloride 0.9 % 100 mL IVPB  1 g Intravenous Q24H Jonah Blue, MD      . clonazePAM Scarlette Calico) tablet 2 mg  2 mg Oral TID PRN Jonah Blue, MD      . docusate sodium (COLACE) capsule 100 mg  100 mg Oral BID Jonah Blue, MD   100 mg at 05/10/19 2105  . enoxaparin (LOVENOX) injection 40 mg  40 mg Subcutaneous Q24H Jonah Blue, MD   Stopped at 05/10/19 1825  . fenofibrate tablet 160 mg  160 mg Oral Daily Jonah Blue, MD   160 mg at 05/11/19 4401  . haloperidol lactate (HALDOL) injection 5 mg  5 mg Intravenous Q6H PRN Jonah Blue, MD   5 mg at 05/10/19 1815  . hydrALAZINE (APRESOLINE) injection 10 mg  10 mg Intravenous Q4H PRN Jonah Blue, MD   10 mg at 05/11/19 0432  . levothyroxine (SYNTHROID) tablet 137 mcg  137 mcg Oral Q0600 Jonah Blue, MD   137 mcg at 05/11/19 0553  . losartan (COZAAR) tablet 100 mg  100 mg Oral Daily Jonah Blue, MD   100 mg at 05/11/19 0272  . ondansetron (ZOFRAN) tablet 4 mg  4 mg Oral Q6H PRN Jonah Blue, MD       Or  . ondansetron [facility]) injection 4 mg  4 mg Intravenous Q6H PRN Jonah Blue, MD      . PARoxetine (PAXIL) tablet 20 mg   20 mg Oral Daily Jonah Blue, MD      . traMADol Janean Sark) tablet 50-100 mg  50-100 mg Oral Q6H PRN Jonah Blue, MD      . zolpidem Remus Loffler) tablet 5 mg  5 mg Oral Noemi Chapel, MD   5 mg at 05/10/19 2105    Musculoskeletal: Strength & Muscle Tone: unable to assess Gait & Station: unable to assess Patient leans: unable to assess  Psychiatric Specialty Exam: Physical Exam  Nursing note and vitals reviewed. Constitutional: She is oriented to person, place, and time. She appears well-developed.  HENT:  Head: Normocephalic.  Patient hearing impaired, requires hearing aids at home  Cardiovascular: Normal rate.  Respiratory: Effort normal.  Neurological: She is alert and oriented to person, place, and time.  Psychiatric: She has a normal mood and affect. Her speech is normal and behavior is normal. Judgment and thought content normal. She exhibits abnormal recent memory.    Review of Systems  Constitutional: Negative.   HENT: Positive for hearing loss.   Eyes: Negative.   Respiratory: Negative.   Cardiovascular: Negative.   Gastrointestinal: Negative.   Genitourinary: Negative.   Musculoskeletal: Negative.   Skin: Negative.   Neurological: Negative.     Blood pressure (!) 165/73, pulse 68, temperature 98.3 F (36.8 C), temperature source Oral, resp. rate (!) 21, weight 80.8 kg, SpO2 100 %.Body mass index is 35.38 kg/m.  General Appearance: Casual  Eye Contact:  Fair  Speech:  slow, related to hearing difficulty  Volume:  Normal  Mood:  Euthymic  Affect:  Appropriate  Thought Process:  Coherent, Goal Directed and Descriptions of Associations: Intact  Orientation:  Full (Time, Place, and Person)  Thought Content:  Logical  Suicidal Thoughts:  No  Homicidal Thoughts:  No  Memory:  Immediate;   Fair  Judgement:  Fair  Insight:  Fair  Psychomotor Activity:  Normal  Concentration:  Concentration: Good  Recall:  Fair  Fund of Knowledge:  Good  Language:  Good   Akathisia:  No  Handed:  Right  AIMS (if indicated):     Assets:  Architect Housing Resilience Social Support  ADL's:  Unable to assess   Cognition:  WNL  Sleep:        Treatment Plan Summary: Cleared by psychiatry. Please re consult psychiatry if further needs.  Recommend consider discontinue Ambien , consider Seroquel  QHS.   Disposition: No evidence of imminent risk to self or others at present.   Patient does not meet criteria for psychiatric inpatient admission. Discussed crisis plan, support from social network, calling 911, coming to the Emergency Department, and calling Suicide Hotline.  This service was provided via telemedicine using a 2-way, interactive audio and video technology.  Names of all persons participating in this telemedicine service and their role in this encounter. Name: Kimberly [name] Role: Patient  Name: Carmie Kanner- via telephone Role: Patient's daughter  Name: Berneice Heinrich  Role: FNP    Patrcia Dolly, FNP 05/11/2019 11:18 AM

## 2019-05-11 NOTE — Plan of Care (Signed)
  Problem: Activity: Goal: Risk for activity intolerance will decrease Outcome: Progressing   Problem: Nutrition: Goal: Adequate nutrition will be maintained Outcome: Progressing   Problem: Pain Managment: Goal: General experience of comfort will improve Outcome: Completed/Met   Problem: Safety: Goal: Ability to remain free from injury will improve Outcome: Progressing   Problem: Skin Integrity: Goal: Risk for impaired skin integrity will decrease Outcome: Completed/Met

## 2019-05-11 NOTE — Discharge Summary (Signed)
Physician Discharge Summary  [name] LXB:[id number] DOB: 12/18/1940 DOA: 05/10/2019  PCP: Clayborn Heron, MD  Admit date: 05/10/2019 Discharge date: 05/11/2019  Admitted From: Home  Discharge disposition: Home   Recommendations for Outpatient Follow-Up:   Follow up with your primary care provider in one week.    Discharge Diagnosis:   Principal Problem:   AMS (altered mental status) Active Problems:   Chronic diastolic heart failure (HCC)   Postsurgical hypothyroidism   Chronic kidney disease, stage 3a   Accelerated hypertension  Discharge Condition: Improved.  Diet recommendation: Low sodium, heart healthy.   Wound care: None.  Code status: Full.   History of Present Illness:   Kimberly [name] is a 78 y.o. female with medical history significant of OSA on CPAP; HLD; diet-controlled DM; hypercalcemia; postsurgical hypothyroidism; and chronic diastolic CHF presented to the hospital with AMS. The patient was very agitated at the time of admission.  The family had noticed that late last week that she was sleeping a lot, didn't want to get out of bed.  She was quite confused when she did get up - unsure where she was, having visual hallucinations.    Her husband has dementia and has hallucinations when he is dehydrated.  ED Course:   Patient was thought to have either UTI or hypertensive encephalopathy with hallucinations.  UA also abnormal.   Hospital Course:  Following conditions were addressed during hospitalization,  Altered mental status likely metabolic encephalopathy from E. coli UTI.  Patient did have some hallucination.  Likely Ambien and tramadol also contributing to her altered mental status.  Spoke with the patient's daughter at length about it.  Psychiatric has seen the patient and recommend Seroquel at night for sleep.  Patient does have a mild baseline confusion.  Patient received IV Rocephin in the ED and will continue Omnicef on discharge.   Patient was initially on one-to-one sitter and restraints.  At the time of discharge, patient was able to ambulate by herself, was off restraints and was fully alert awake oriented x4.  Urine culture showed E. Coli, sensitivity pending.  COVID-19 was negative.  CT head scan was negative for acute findings.  Chest x-ray was unremarkable.  Hip x-rays were unremarkable.  Mild hypokalemia.  Replenished orally.  Potassium 3.4.  40 mEq of potassium given.  Accelerated HTN -On presentation.  Resume home medications on discharge..  Advised to follow-up with her primary care physician as outpatient.  Chronic diastolic CHF -06/2017 echo with preserved EF and grade 2 diastolic dysfunction. Compensated at this time.  Postsurgical hypothyroidism Resume Synthroid from home.  TSH of 6.14 weeks back letter from 2 months back at 15.  Stage 3a CKD -Appears to be at baseline at this time  Disposition.  At this time, patient is stable for disposition home.  I have prolonged discussion with the patient's daughter on the phone.  Spoke about potential ambien/tramadol and UTI causing confusion.  Patient has improved at this time.  I have encouraged her to follow-up with her primary care physician as outpatient to discuss about her medications.   Medical Consultants:    Psychiatry   Subjective:   Today, patient feels better.  Alert awake and communicative.  Discharge Exam:   Vitals:   05/11/19 1000 05/11/19 1100  BP: (!) 172/62 (!) 165/73  Pulse: 67 68  Resp: (!) 28 (!) 21  Temp:    SpO2:     Vitals:   05/11/19 0800 05/11/19 0900 05/11/19 1000  05/11/19 1100  BP: (!) 171/59 (!) 178/66 (!) 172/62 (!) 165/73  Pulse: 72 73 67 68  Resp: (!) 27 16 (!) 28 (!) 21  Temp: 98.3 F (36.8 C)     TempSrc: Oral     SpO2: 100%     Weight:        General exam: Appears calm and comfortable ,Not in distress, alert awake and oriented.  Hard of hearing HEENT:PERRL,Oral mucosa moist Respiratory system:  Bilateral equal air entry, normal vesicular breath sounds, no wheezes or crackles  Cardiovascular system: S1 & S2 heard, RRR.  Gastrointestinal system: Abdomen is nondistended, soft and nontender. No organomegaly or masses felt. Normal bowel sounds heard. Central nervous system: Alert and oriented. No focal neurological deficits. Extremities: No edema, no clubbing ,no cyanosis, distal peripheral pulses palpable. Skin: No rashes, lesions or ulcers,no icterus ,no pallor MSK: Normal muscle bulk,tone ,power    Procedures:    None  The results of significant diagnostics from this hospitalization (including imaging, microbiology, ancillary and laboratory) are listed below for reference.     Diagnostic Studies:   DG Chest 2 View  Result Date: 05/10/2019 CLINICAL DATA:  Altered mental status, fall EXAM: CHEST - 2 VIEW COMPARISON:  06/12/2017 FINDINGS: Lungs are clear.  No pleural effusion or pneumothorax. Cardiomegaly. Mild degenerative changes of the visualized thoracolumbar spine. IMPRESSION: Normal chest radiographs. Electronically Signed   By: Charline Bills M.D.   On: 05/10/2019 09:23   CT Head Wo Contrast  Result Date: 05/10/2019 CLINICAL DATA:  Head trauma, minor. EXAM: CT HEAD WITHOUT CONTRAST TECHNIQUE: Contiguous axial images were obtained from the base of the skull through the vertex without intravenous contrast. COMPARISON:  Brain MRI 08/25/2014, head CT 08/25/2014 FINDINGS: Brain: No evidence of acute intracranial hemorrhage. No demarcated cortical infarction. No evidence of intracranial mass. No midline shift or extra-axial fluid collection. Mild ill-defined hypoattenuation within the subcortical and deep white matter is nonspecific, but consistent with chronic small vessel ischemic disease. Cerebral volume is normal for age. Vascular: No hyperdense vessel. Atherosclerotic calcifications. Skull: Normal. Negative for fracture or focal lesion. Sinuses/Orbits: Visualized orbits  demonstrate no acute abnormality. No significant paranasal sinus disease or mastoid effusion at the imaged levels. IMPRESSION: No evidence of acute intracranial abnormality. Mild chronic small vessel ischemic disease. Electronically Signed   By: [name] DO   On: 05/10/2019 10:10   DG Hip Unilat With Pelvis 2-3 Views Left  Result Date: 05/10/2019 CLINICAL DATA:  Fall, hip pain EXAM: DG HIP (WITH OR WITHOUT PELVIS) 2-3V LEFT COMPARISON:  None. FINDINGS: No fracture or dislocation is seen. Bowel hip joint spaces are preserved. Mild degenerative changes of the lower lumbar spine. Surgical clips overlying the pelvis. IMPRESSION: Negative. Electronically Signed   By: Charline Bills M.D.   On: 05/10/2019 09:22     Labs:   Basic Metabolic Panel: Recent Labs  Lab 05/10/19 0753 05/11/19 0240  NA 140 143  K 3.8 3.4*  CL 102 106  CO2 25 26  GLUCOSE 129* 112*  BUN 23 19  CREATININE 1.04* 1.12*  CALCIUM 9.9 9.2   GFR Estimated Creatinine Clearance: 39.2 mL/min (A) (by C-G formula based on SCr of 1.12 mg/dL (H)). Liver Function Tests: Recent Labs  Lab 05/10/19 0753  AST 20  ALT 16  ALKPHOS 35*  BILITOT 0.7  PROT 6.5  ALBUMIN 3.6   No results for input(s): LIPASE, AMYLASE in the last 168 hours. No results for input(s): AMMONIA in the last 168 hours.  Coagulation profile No results for input(s): INR, PROTIME in the last 168 hours.  CBC: Recent Labs  Lab 05/10/19 0753 05/11/19 0240  WBC 8.2 8.4  HGB 12.3 12.2  HCT 37.7 36.5  MCV 90.6 89.0  PLT 221 238   Cardiac Enzymes: No results for input(s): CKTOTAL, CKMB, CKMBINDEX, TROPONINI in the last 168 hours. BNP: Invalid input(s): POCBNP CBG: No results for input(s): GLUCAP in the last 168 hours. D-Dimer No results for input(s): DDIMER in the last 72 hours. Hgb A1c No results for input(s): HGBA1C in the last 72 hours. Lipid Profile No results for input(s): CHOL, HDL, LDLCALC, TRIG, CHOLHDL, LDLDIRECT in the last 72  hours. Thyroid function studies No results for input(s): TSH, T4TOTAL, T3FREE, THYROIDAB in the last 72 hours.  Invalid input(s): FREET3 Anemia work up No results for input(s): VITAMINB12, FOLATE, FERRITIN, TIBC, IRON, RETICCTPCT in the last 72 hours. Microbiology Recent Results (from the past 240 hour(s))  Urine culture     Status: Abnormal (Preliminary result)   Collection Time: 05/10/19  1:32 PM   Specimen: Urine, Random  Result Value Ref Range Status   Specimen Description URINE, RANDOM  Final   Special Requests   Final    NONE Performed at [facility], [address] [place], [place] [address]    Culture >=100,000 COLONIES/mL ESCHERICHIA COLI (A)  Final   Report Status PENDING  Incomplete  SARS CORONAVIRUS 2 (TAT 6-24 HRS) Nasopharyngeal Nasopharyngeal Swab     Status: None   Collection Time: 05/10/19  2:40 PM   Specimen: Nasopharyngeal Swab  Result Value Ref Range Status   SARS Coronavirus 2 NEGATIVE NEGATIVE Final    Comment: (NOTE) SARS-CoV-2 target nucleic acids are NOT DETECTED. The SARS-CoV-2 RNA is generally detectable in upper and lower respiratory specimens during the acute phase of infection. Negative results do not preclude SARS-CoV-2 infection, do not rule out co-infections with other pathogens, and should not be used as the sole basis for treatment or other patient management decisions. Negative results must be combined with clinical observations, patient history, and epidemiological information. The expected result is Negative. Fact Sheet for Patients: SugarRoll.be Fact Sheet for Healthcare Providers: https://www.woods-mathews.com/ This test is not yet approved or cleared by the [place] FDA and  has been authorized for detection and/or diagnosis of SARS-CoV-2 by FDA under an Emergency Use Authorization (EUA). This EUA will remain  in effect (meaning this test can be used) for the duration of  the COVID-19 declaration under Section 56 4(b)(1) of the Act, 21 U.S.C. section 360bbb-3(b)(1), unless the authorization is terminated or revoked sooner. Performed at [facility], [place] [address] [place], [place] [address]   MRSA PCR Screening     Status: Abnormal   Collection Time: 05/10/19  8:14 PM   Specimen: Nasal Mucosa; Nasopharyngeal  Result Value Ref Range Status   MRSA by PCR POSITIVE (A) NEGATIVE Final    Comment:        The GeneXpert MRSA Assay (FDA approved for NASAL specimens only), is one component of a comprehensive MRSA colonization surveillance program. It is not intended to diagnose MRSA infection nor to guide or monitor treatment for MRSA infections. RESULT CALLED TO, READ BACK BY AND VERIFIED WITH: Jani Gravel RN 05/10/19 2320 JDW Performed at [facility] [address] [place], [place] [address]      Discharge Instructions:   Discharge Instructions    Call MD for:   Complete by: As directed  Worsening symptoms   Diet - low sodium heart healthy   Complete by: As directed    Discharge instructions   Complete by: As directed    Complete the course of antibiotic.  Follow-up with your primary care physician in 1 week.  Increase fluid intake.  Please do not take Ambien/tramadol.  You have been prescribed Seroquel at night time.   Increase activity slowly   Complete by: As directed      Allergies as of 05/11/2019      Reactions   Percocet [oxycodone-acetaminophen] Shortness Of Breath, Other (See Comments)   Pt couldn't breathe or catch her breath   Codeine Other (See Comments)   hyperactivity   Other Other (See Comments)   [facility] caused hallucinations    Phenergan [promethazine Hcl] Other (See Comments)   Hallucinations   Clonidine Derivatives    Bradycardia (06/2017)   Hydralazine    Headache/dizzy   Metoprolol    Bradycardia 06/2017      Medication List    STOP taking  these medications   zolpidem 10 MG tablet Commonly known as: AMBIEN     TAKE these medications   amLODipine 5 MG tablet Commonly known as: NORVASC Take 1 tablet (5 mg total) by mouth daily.   aspirin 81 MG chewable tablet Chew 81 mg by mouth daily.   cefdinir 300 MG capsule Commonly known as: OMNICEF Take 1 capsule (300 mg total) by mouth 2 (two) times daily for 3 days.   fenofibrate micronized 200 MG capsule Commonly known as: LOFIBRA Take 200 mg by mouth daily before breakfast.   furosemide 20 MG tablet Commonly known as: LASIX TAKE 1 TABLET BY MOUTH ONCE DAILY   levothyroxine 150 MCG tablet Commonly known as: SYNTHROID Take 150 mcg by mouth daily before breakfast.   losartan 100 MG tablet Commonly known as: COZAAR Take 1 tablet (100 mg total) by mouth daily.   nitroGLYCERIN 0.4 MG SL tablet Commonly known as: NITROSTAT Place 1 tablet (0.4 mg total) under the tongue every 5 (five) minutes as needed for chest pain.   PARoxetine 20 MG tablet Commonly known as: PAXIL Take 60 mg by mouth daily.   prednisoLONE acetate 1 % ophthalmic suspension Commonly known as: PRED FORTE Place 1 drop into both eyes daily. Use as directed   QUEtiapine 25 MG tablet Commonly known as: SEROquel Take 1 tablet (25 mg total) by mouth at bedtime.   Vitamin D3 125 MCG (5000 UT) Caps Take 5,000 Units by mouth daily.      Follow-up Information    Rankins, Fanny DanceVictoria R, MD. Schedule an appointment as soon as possible for a visit in 1 week(s).   Specialty: Family Medicine Why: regular followup Contact information: [address] [place] [address] [phone]        Quintella Reicherturner, Traci R, MD .   Specialty: Cardiology Contact information: [address] [place] [address] [phone]            Time coordinating discharge: 39 minutes  Signed:  Oline Belk  [facility] 05/11/2019, 2:24 PM

## 2019-05-12 LAB — URINE CULTURE: Culture: >=100000 — AB

## 2019-05-19 ENCOUNTER — Other Ambulatory Visit: Payer: Self-pay | Admitting: Cardiology

## 2019-05-19 MED ORDER — AMLODIPINE BESYLATE 5 MG PO TABS: 5.0000 mg | ORAL_TABLET | Freq: Every day | ORAL | 9 refills | Status: DC

## 2019-05-19 NOTE — Telephone Encounter (Signed)
Pt's medication was sent to pt's pharmacy as requested. Confirmation received.  °

## 2019-06-16 ENCOUNTER — Other Ambulatory Visit: Payer: Medicare Other

## 2019-06-20 ENCOUNTER — Telehealth: Payer: Self-pay | Admitting: Internal Medicine

## 2019-06-20 NOTE — Telephone Encounter (Signed)
Patient called and said she has a new pharmacy :  [facility] [address] [place], [place]  [address]  Ph# [phone]

## 2019-06-20 NOTE — Telephone Encounter (Signed)
Noted.   Pharmacy updated 

## 2019-06-22 ENCOUNTER — Other Ambulatory Visit: Payer: Medicare PPO

## 2019-07-20 ENCOUNTER — Other Ambulatory Visit: Payer: Self-pay

## 2019-07-20 ENCOUNTER — Other Ambulatory Visit (INDEPENDENT_AMBULATORY_CARE_PROVIDER_SITE_OTHER): Payer: Medicare PPO

## 2019-07-20 DIAGNOSIS — E559 Vitamin D deficiency, unspecified: Secondary | ICD-10-CM | POA: Diagnosis not present

## 2019-07-20 DIAGNOSIS — E890 Postprocedural hypothyroidism: Secondary | ICD-10-CM

## 2019-07-20 DIAGNOSIS — Z9889 Other specified postprocedural states: Secondary | ICD-10-CM

## 2019-07-21 LAB — VITAMIN D 25 HYDROXY (VIT D DEFICIENCY, FRACTURES): VITD: 34.49 ng/mL (ref 30.00–100.00)

## 2019-07-21 LAB — TSH: TSH: 5.89 u[IU]/mL — ABNORMAL HIGH (ref 0.35–4.50)

## 2019-07-21 LAB — T4, FREE: Free T4: 1.18 ng/dL (ref 0.60–1.60)

## 2019-08-23 DIAGNOSIS — Z961 Presence of intraocular lens: Secondary | ICD-10-CM | POA: Diagnosis not present

## 2019-08-23 DIAGNOSIS — R7309 Other abnormal glucose: Secondary | ICD-10-CM | POA: Diagnosis not present

## 2019-08-23 DIAGNOSIS — H401131 Primary open-angle glaucoma, bilateral, mild stage: Secondary | ICD-10-CM | POA: Diagnosis not present

## 2019-08-23 DIAGNOSIS — H18513 Endothelial corneal dystrophy, bilateral: Secondary | ICD-10-CM | POA: Diagnosis not present

## 2019-08-25 ENCOUNTER — Ambulatory Visit: Payer: Medicare PPO | Admitting: Cardiology

## 2019-08-25 ENCOUNTER — Encounter: Payer: Self-pay | Admitting: Cardiology

## 2019-08-25 ENCOUNTER — Other Ambulatory Visit: Payer: Self-pay

## 2019-08-25 VITALS — BP 172/94 | HR 69 | Ht 59.5 in | Wt 172.2 lb

## 2019-08-25 DIAGNOSIS — I5032 Chronic diastolic (congestive) heart failure: Secondary | ICD-10-CM

## 2019-08-25 DIAGNOSIS — R001 Bradycardia, unspecified: Secondary | ICD-10-CM | POA: Diagnosis not present

## 2019-08-25 DIAGNOSIS — I10 Essential (primary) hypertension: Secondary | ICD-10-CM | POA: Diagnosis not present

## 2019-08-25 DIAGNOSIS — I340 Nonrheumatic mitral (valve) insufficiency: Secondary | ICD-10-CM | POA: Diagnosis not present

## 2019-08-25 DIAGNOSIS — G4733 Obstructive sleep apnea (adult) (pediatric): Secondary | ICD-10-CM | POA: Diagnosis not present

## 2019-08-25 MED ORDER — LOSARTAN POTASSIUM 50 MG PO TABS: 50.0000 mg | ORAL_TABLET | Freq: Every day | ORAL | 3 refills | Status: DC

## 2019-08-25 NOTE — Progress Notes (Signed)
Cardiology Office Note:    Date:  08/25/2019   ID:  SHON MANSOURI, DOB 07/04/1940, MRN [id number]  PCP:  Clayborn Heron, MD  Cardiologist:  Armanda Magic, MD    Referring MD: Clayborn Heron, MD   Chief Complaint  Patient presents with  . Congestive Heart Failure  . Hypertension    History of Present Illness:    Kimberly [name] is a 79 y.o. female with a hx of chronic diastolic CHF, tonic lower extremity edema and hypertension.  She had a cath in 2010 due to CP showing normal coronary arteries with elevated LVEDP felt to be due to diastolic CHF vs. Microvascular angina. Her CP resolved for a while but reoccurred and nuclear stress test 09/2014 was normal.  2D echocardiogram 09/2014 showed normal LV function with EF 55 to 60% and diastolic dysfunction.  She was admitted February 2019 with junctional bradycardia, chest pain and shortness of breath.  Her heart rate was in the 40s.  Chest pressure was felt to be due to symptomatic junctional bradycardia.  Her clonidine and metoprolol were stopped.  2D echocardiogram at that time showed EF 55 to 60%.  Amlodipine was added for blood pressure control.  She was seen back in the office on 06/23/2017 her heart rate was 68 bpm.  Her weakness resolved.  She was trialed on hydralazine in the past but developed headache/dizziness after taking hydralazine so this was stopped. At last OV, she was restarted on lower dose of amlodipine with titration of losartan to 50mg  daily. It does not appear she came for f/u as recommended - this was right around beginning of pandemic. She has been on varying doses of amlodipine over the years as well as lisinopril, HCTZ, and chlorthalidone at one point. Amlodipine had to be reduced to 5mg  daily due to lower extremity edema. She had rise in creatinine with chlorthalidone 25mg  daily so this was discontinued.  She was last seen 02/2019 by , PA for followup and had a lot of psychosocial issues.  Her BP was  poorly controlled at 190/33mmHg and her Losartan was increased to 100mg  daily.  Amlodipine was not titrated due to problems with LE edema in the past.  It was felt that uncontrolled depression and anxiety could also be playing a role in her BP elevations.  She was also felt to have depression and was referred back to her PCP to address her depression and antihypertensive meds so there were not multiple MDs making changes to her meds and causing confusion.   She is here today for followup and is doing well.  She does continue to complain of anxiety related to taking care of her husband and says that her children are making decisions about her without her input.  She denies any chest pain or pressure and no SOB.  She occasionally has some LE edema but denies any PND, orthopnea, dizziness or syncope.       Past Medical History:  Diagnosis Date  . Acid reflux   . Anxiety   . Arthritis   . Back spasm   . Bradycardia    a. junctional and sinus brady during admission 06/2017 requiring cessation of beta blocker and clonidine.  . Chronic diastolic heart failure (HCC) 03/20/2015  . Chronic edema   . Depression   . Diabetes mellitus without complication (HCC)    diet controled  . Fuch's endothelial dystrophy    bilateral eyes  . Headache    occ  .  HOH (hard of hearing)    wears hearing aid in left ear  . Hypercholesteremia   . Hypertensive heart disease with CHF (HCC) 09/11/2014  . Insomnia   . Junctional bradycardia   . Mild mitral regurgitation   . Mild tricuspid regurgitation   . Normal coronary arteries 2010   cath with no obstructive epicardial disease.  Done for CP which was felt to be due to microvascular angina or diastolic HF  . OSA on CPAP    Followed by Dr. Fannie Knee    Past Surgical History:  Procedure Laterality Date  . ABDOMINAL HYSTERECTOMY    . APPENDECTOMY    . CARDIAC CATHETERIZATION     no CAD  . CESAREAN SECTION     x 2  . CHOLECYSTECTOMY    . COLONOSCOPY    .  EYE SURGERY Right    implant  . ROTATOR CUFF REPAIR Left   . SHOULDER ARTHROSCOPY WITH SUBACROMIAL DECOMPRESSION AND BICEP TENDON REPAIR Left 08/24/2012   Procedure: SHOULDER ARTHROSCOPY WITH SUBACROMIAL DECOMPRESSION AND BICEP TENDON REPAIR;  Surgeon: Cammy Copa, MD;  Location: [facility] OR;  Service: Orthopedics;  Laterality: Left;  Left Shoulder Arthroscopy, Debridement, Biceps Tenotomy  . THYROIDECTOMY N/A 06/12/2015   Procedure: TOTAL THYROIDECTOMY;  Surgeon: Darnell Level, MD;  Location: [facility] OR;  Service: General;  Laterality: N/A;    Current Medications: Current Meds  Medication Sig  . amLODipine (NORVASC) 5 MG tablet Take 1 tablet (5 mg total) by mouth daily.  Marland Kitchen aspirin 81 MG chewable tablet Chew 81 mg by mouth daily.  . Cholecalciferol (VITAMIN D3) 5000 units CAPS Take 5,000 Units by mouth daily.  . fenofibrate micronized (LOFIBRA) 200 MG capsule Take 200 mg by mouth daily before breakfast.  . levothyroxine (SYNTHROID) 150 MCG tablet Take 150 mcg by mouth daily before breakfast.  . nitroGLYCERIN (NITROSTAT) 0.4 MG SL tablet Place 1 tablet (0.4 mg total) under the tongue every 5 (five) minutes as needed for chest pain.  Marland Kitchen PARoxetine (PAXIL) 20 MG tablet Take 60 mg by mouth daily.   . prednisoLONE acetate (PRED FORTE) 1 % ophthalmic suspension Place 1 drop into both eyes daily. Use as directed     Allergies:   Percocet [oxycodone-acetaminophen], Codeine, Other, Phenergan [promethazine hcl], Clonidine derivatives, Hydralazine, and Metoprolol   Social History   Socioeconomic History  . Marital status: Married    Spouse name: Not on file  . Number of children: 2  . Years of education: Not on file  . Highest education level: Not on file  Occupational History  . Occupation: Radiographer, therapeutic  Tobacco Use  . Smoking status: Never Smoker  . Smokeless tobacco: Never Used  Substance and Sexual Activity  . Alcohol use: No    Alcohol/week: 0.0 standard drinks  . Drug use: No   . Sexual activity: Not on file  Other Topics Concern  . Not on file  Social History Narrative  . Not on file   Social Determinants of Health   Financial Resource Strain:   . Difficulty of Paying Living Expenses:   Food Insecurity:   . Worried About Programme researcher, broadcasting/film/video in the Last Year:   . Barista in the Last Year:   Transportation Needs:   . Freight forwarder (Medical):   Marland Kitchen Lack of Transportation (Non-Medical):   Physical Activity:   . Days of Exercise per Week:   . Minutes of Exercise per Session:   Stress:   . Feeling  of Stress :   Social Connections:   . Frequency of Communication with Friends and Family:   . Frequency of Social Gatherings with Friends and Family:   . Attends Religious Services:   . Active Member of Clubs or Organizations:   . Attends Banker Meetings:   Marland Kitchen Marital Status:      Family History: The patient's family history includes Cancer in her sister; Stomach cancer in her mother.  ROS:   Please see the history of present illness.    ROS  All other systems reviewed and negative.   EKGs/Labs/Other Studies Reviewed:    The following studies were reviewed today: Outside labs from PCP  EKG:  EKG is not ordered today.    Recent Labs: 05/10/2019: ALT 16 05/11/2019: BUN 19; Creatinine, Ser 1.12; Hemoglobin 12.2; Platelets 238; Potassium 3.4; Sodium 143 07/20/2019: TSH 5.89   Recent Lipid Panel    Component Value Date/Time   CHOL 152 10/02/2015 0901   TRIG 169 (H) 10/02/2015 0901   HDL 30 (L) 10/02/2015 0901   CHOLHDL 5.1 (H) 10/02/2015 0901   VLDL 34 (H) 10/02/2015 0901   LDLCALC 88 10/02/2015 0901    Physical Exam:    VS:  BP (!) 172/94   Pulse 69   Ht 4' 11.5" (1.511 m)   Wt 172 lb 3.2 oz (78.1 kg)   SpO2 95%   BMI 34.20 kg/m     Wt Readings from Last 3 Encounters:  08/25/19 172 lb 3.2 oz (78.1 kg)  05/10/19 178 lb 2.1 oz (80.8 kg)  03/01/19 184 lb (83.5 kg)     GEN:  Well nourished, well developed  in no acute distress HEENT: Normal NECK: No JVD; No carotid bruits LYMPHATICS: No lymphadenopathy CARDIAC: RRR, no murmurs, rubs, gallops RESPIRATORY:  Clear to auscultation without rales, wheezing or rhonchi  ABDOMEN: Soft, non-tender, non-distended MUSCULOSKELETAL:  No edema; No deformity  SKIN: Warm and dry NEUROLOGIC:  Alert and oriented x 3 PSYCHIATRIC:  Normal affect   ASSESSMENT:    1. Essential hypertension   2. Chronic diastolic heart failure (HCC)   3. Junctional bradycardia   4. Mild mitral regurgitation    PLAN:    In order of problems listed above:  1.  HTN -BP is poorly controlled -she is no longer taking losartan or diuretic but does not know why.  At first she told me she lost her medications.  Then she said that her children told her to stop the meds and then said that she thinks Dr. Luciana Axe told her to stop them.  -her last creatinine was 1.27 in January and unclear If this is why her PCP stopped the meds. -since her BP is fairly high today I have instructed her to resume her Losartan at 50mg  daily -check a BMET in 1 week  -followup with Dr. next week for further guidance on BP management  2.  CHronic diastolic CHF -she appears euvolemic on exam -currently she is not taking diuretic therapy -I will keep her off diuretics for now since we are restarting her ARB -If renal function remains stable with reinstituting her ARB then could consider restarting her diuretic  3.  Junctional Bradycardia -no further episodes off BB and Clonidine  4.  Mitral regurgitation -no murmur on exam   Medication Adjustments/Labs and Tests Ordered: Current medicines are reviewed at length with the patient today.  Concerns regarding medicines are outlined above.  Orders Placed This Encounter  Procedures  . Basic  metabolic panel   Meds ordered this encounter  Medications  . losartan (COZAAR) 50 MG tablet    Sig: Take 1 tablet (50 mg total) by mouth daily.     Dispense:  90 tablet    Refill:  3    Signed, Fransico Him, MD  08/25/2019 8:40 PM    [place]

## 2019-08-25 NOTE — Patient Instructions (Addendum)
Medication Instructions:  Your physician has recommended you make the following change in your medication:  1) RESTART losartan (Cozaar) 50 mg daily  *If you need a refill on your cardiac medications before your next appointment, please call your pharmacy*   Lab Work: BMET in one week.  If you have labs (blood work) drawn today and your tests are completely normal, you will receive your results only by: Marland Kitchen MyChart Message (if you have MyChart) OR . A paper copy in the mail If you have any lab test that is abnormal or we need to change your treatment, we will call you to review the results.   Follow-Up: At [facility], you and your health needs are our priority.  As part of our continuing mission to provide you with exceptional heart care, we have created designated Provider Care Teams.  These Care Teams include your primary Cardiologist (physician) and Advanced Practice Providers (APPs -  Physician Assistants and Nurse Practitioners) who all work together to provide you with the care you need, when you need it.  We recommend signing up for the patient portal called "MyChart".  Sign up information is provided on this After Visit Summary.  MyChart is used to connect with patients for Virtual Visits (Telemedicine).  Patients are able to view lab/test results, encounter notes, upcoming appointments, etc.  Non-urgent messages can be sent to your provider as well.   To learn more about what you can do with MyChart, go to [email or web address].    Your next appointment:   4 week(s)  The format for your next appointment:   In Person  Provider:   [name], PA-C

## 2019-08-30 ENCOUNTER — Ambulatory Visit: Payer: Medicare PPO | Admitting: Internal Medicine

## 2019-09-05 ENCOUNTER — Other Ambulatory Visit: Payer: Medicare PPO

## 2019-09-05 DIAGNOSIS — E1169 Type 2 diabetes mellitus with other specified complication: Secondary | ICD-10-CM | POA: Diagnosis not present

## 2019-09-05 DIAGNOSIS — E119 Type 2 diabetes mellitus without complications: Secondary | ICD-10-CM | POA: Diagnosis not present

## 2019-09-05 DIAGNOSIS — E7800 Pure hypercholesterolemia, unspecified: Secondary | ICD-10-CM | POA: Diagnosis not present

## 2019-09-05 DIAGNOSIS — I10 Essential (primary) hypertension: Secondary | ICD-10-CM | POA: Diagnosis not present

## 2019-09-05 DIAGNOSIS — K439 Ventral hernia without obstruction or gangrene: Secondary | ICD-10-CM | POA: Diagnosis not present

## 2019-09-05 DIAGNOSIS — F411 Generalized anxiety disorder: Secondary | ICD-10-CM | POA: Diagnosis not present

## 2019-09-05 DIAGNOSIS — K429 Umbilical hernia without obstruction or gangrene: Secondary | ICD-10-CM | POA: Diagnosis not present

## 2019-09-05 DIAGNOSIS — E039 Hypothyroidism, unspecified: Secondary | ICD-10-CM | POA: Diagnosis not present

## 2019-09-22 ENCOUNTER — Other Ambulatory Visit: Payer: Self-pay

## 2019-09-22 ENCOUNTER — Ambulatory Visit: Payer: Medicare PPO | Admitting: Internal Medicine

## 2019-09-22 ENCOUNTER — Encounter: Payer: Self-pay | Admitting: Internal Medicine

## 2019-09-22 VITALS — BP 148/100 | HR 77 | Ht 59.5 in | Wt 168.0 lb

## 2019-09-22 DIAGNOSIS — E890 Postprocedural hypothyroidism: Secondary | ICD-10-CM | POA: Diagnosis not present

## 2019-09-22 DIAGNOSIS — E559 Vitamin D deficiency, unspecified: Secondary | ICD-10-CM | POA: Diagnosis not present

## 2019-09-22 NOTE — Patient Instructions (Signed)
  Please continue levothyroxine 150 mcg daily.  Take the thyroid hormone every day, with water, at least 30 minutes before breakfast, separated by at least 4 hours from: - acid reflux medications - calcium - iron - multivitamins  Please stop at the lab.  Please come back for a follow-up appointment in 6 months.

## 2019-09-22 NOTE — Progress Notes (Signed)
Patient ID: Kimberly [name], female   DOB: 1940/11/04, 79 y.o.   MRN: [id number]  This visit occurred during the SARS-CoV-2 public health emergency.  Safety protocols were in place, including screening questions prior to the visit, additional usage of staff PPE, and extensive cleaning of exam room while observing appropriate contact time as indicated for disinfecting solutions.   HPI  Kimberly [name] is a 79 y.o.-year-old female, returning for f/u for hypercalcemia, dx 2012, postsurgical hypothyroidism. Last visit was 7 months ago.  She is limited in coming for the appointments by the need for transportation.   Since last visit, she was admitted in 07/2019 with encephalopathy either related to UTI or hypertensive.  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 He had prostate cancer and finished treatment.  He also had stents in his legs and valvular surgery.  He was walking with a walker >> now in a wheelchair.  She cannot afford to get help with him.  Postsurgical hypothyroidism: - She had thyroidectomy for substernal goiter in 05/2015: Benign, nodule pathology  Reviewed history: She had problems with compliance with her levothyroxine in the past.  In 06/2017 he was hospitalized and was found to have a junctional bradycardia with a TSH of 42.  At that time, daughter reported that she was not taking the levothyroxine consistently.  Afterwards, she started to put the medication bottle on her nightstand and was consistently taking it daily.  However, she then had a TSH level that was high, at 15.  TSH remains high so we increased the dose to 150 mcg daily in 07/2019.  She is on LT4 150 mcg daily (increased 07/2019): - in am - fasting - at least 30 min from b'fast - + Ca, +  multivitamins later in the day - no Fe, MVI, PPIs - not on Biotin  Reviewed TFTs: Lab Results  Component Value Date   TSH 5.89 (H)  07/20/2019   TSH 6.11 (H) 04/13/2019   TSH 15.700 (H) 02/21/2019   TSH 3.93 06/14/2018   TSH 1.74 09/24/2017   TSH 16.43 (H) 06/25/2017   TSH 42.727 (H) 06/12/2017   TSH 41.60 (H) 01/16/2016   TSH 31.80 (H) 09/18/2015   TSH 1.128 08/25/2014   FREET4 1.18 07/20/2019   FREET4 1.23 04/13/2019   FREET4 0.98 06/14/2018   FREET4 1.19 09/24/2017   FREET4 1.06 06/25/2017   FREET4 0.84 01/16/2016   FREET4 0.83 09/18/2015  07/31/2015: TSH 58  Surprisingly, her calcium has improved after her thyroid sx in 2016.  Primary hyperparathyroidism: -greatly improved from 2016  Reviewed pertinent labs: Lab Results  Component Value Date   PTH 19 09/18/2015   PTH Comment 09/18/2015   PTH 21 02/08/2015   PTH Comment 02/08/2015   PTH 17 12/09/2013   PTH Comment 12/09/2013   PTH 14.9 08/24/2012   CALCIUM 9.2 05/11/2019   CALCIUM 9.9 05/10/2019   CALCIUM 10.0 02/21/2019   CALCIUM 10.2 07/05/2018   CALCIUM 9.9 06/16/2018   CALCIUM 10.2 06/14/2018   CALCIUM 9.7 06/13/2017   CALCIUM 10.3 06/12/2017   CALCIUM 9.9 01/13/2017   CALCIUM 10.2 01/23/2016  12/25/2014: Calcium 10.8 (8.6-10.3) 09/12/2013: Ca 10.8, iCa 5.7 (4.5-5.6)  She has a vitamin D def.: Lab Results  Component Value Date   VD25OH 34.49 07/20/2019   VD25OH 18.27 (L) 04/13/2019   VD25OH 57.47 06/14/2018   VD25OH 61.83 09/24/2017   VD25OH 32.67  09/18/2015   VD25OH 28.65 (L) 02/08/2015   VD25OH 50.59 12/09/2013   She continues on vitamin D 5000 units.  Reviewed other pertinent labs: Component     Latest Ref Rng 02/08/2015 02/08/2015         2:14 PM  2:14 PM  Vitamin A (Retinoic Acid)     38 - 98 mcg/dL 89    Component     Latest Ref Rng 12/09/2013  Vitamin D 1, 25 (OH) Total     18 - 72 pg/mL 79 (H)  Vitamin D3 1, 25 (OH)      79  Vitamin D2 1, 25 (OH)      <8  PTH-Related Protein (PTH-RP)     14 - 27 pg/mL 16  Phosphorus     2.3 - 4.6 mg/dL 3.2   Her urine calcium was 5: Component     Latest Ref Rng  01/20/2014  Calcium, Ur      14  Calcium, 24 hour urine     100 - 250 mg/day 322 (H)  Creatinine, Urine      51.9  Creatinine, 24H Ur     700 - 1800 mg/day 1194   No history of osteoporosis.  No fractures but has had some falls.  No history of kidney stones.  I reviewed her DXA scan from 2008:  [place] (L1-L3)  BMD: 1.273  T-score (% of young adult value): 2.3  Z-score (% adult age match value): 4.1  LEFT HIP (NECK)  BMD: 0.831  T-score (% of young adult value): -0.2  Z-score (% adult age match value): 1.4  Assessment: This patient is considered normal according to [facility]) criteria.   + CKD.  Latest BUN/creatinine reviewed: Lab Results  Component Value Date   BUN 19 05/11/2019   CREATININE 1.12 (H) 05/11/2019    She has had a technetium sestamibi scan (04/2015) that was negative for a parathyroid adenoma e and a CT scan of her neck (04/2015) there was also negative for possible parathyroid nodule nodule but this showed a substernal goiter with tracheal narrowing.  She also has a history of HTN; HLD; DM 2; CKD- sees Dr Clover Mealy.  ROS: Constitutional: no weight gain/no weight loss, + fatigue, no subjective hyperthermia, no subjective hypothermia Eyes: no blurry vision, no xerophthalmia ENT: no sore throat, no nodules palpated in neck, no dysphagia, no odynophagia, no hoarseness Cardiovascular: no CP/no SOB/no palpitations/+ leg swelling Respiratory: no cough/no SOB/no wheezing Gastrointestinal: no N/no V/no D/no C/no acid reflux Musculoskeletal: no muscle aches/no joint aches Skin: no rashes, no hair loss Neurological: no tremors/no numbness/no tingling/no dizziness  I reviewed pt's medications, allergies, PMH, social hx, family hx, and changes were documented in the history of present illness. Otherwise, unchanged from my initial visit note.  Past Medical History:  Diagnosis Date  . Acid reflux   . Anxiety   . Arthritis   . Back spasm    . Bradycardia    a. junctional and sinus brady during admission 06/2017 requiring cessation of beta blocker and clonidine.  . Chronic diastolic heart failure ([place]) 03/20/2015  . Chronic edema   . Depression   . Diabetes mellitus without complication ([place])    diet controled  . Fuch's endothelial dystrophy    bilateral eyes  . Headache    occ  . HOH (hard of hearing)    wears hearing aid in left ear  . Hypercholesteremia   . Hypertensive heart disease with CHF ([place]) 09/11/2014  .  Insomnia   . Junctional bradycardia   . Mild mitral regurgitation   . Mild tricuspid regurgitation   . Normal coronary arteries 2010   cath with no obstructive epicardial disease.  Done for CP which was felt to be due to microvascular angina or diastolic HF  . OSA on CPAP    Followed by Dr. Keturah Barre   Past Surgical History:  Procedure Laterality Date  . ABDOMINAL HYSTERECTOMY    . APPENDECTOMY    . CARDIAC CATHETERIZATION     no CAD  . CESAREAN SECTION     x 2  . CHOLECYSTECTOMY    . COLONOSCOPY    . EYE SURGERY Right    implant  . ROTATOR CUFF REPAIR Left   . SHOULDER ARTHROSCOPY WITH SUBACROMIAL DECOMPRESSION AND BICEP TENDON REPAIR Left 08/24/2012   Procedure: SHOULDER ARTHROSCOPY WITH SUBACROMIAL DECOMPRESSION AND BICEP TENDON REPAIR;  Surgeon: Meredith Pel, MD;  Location: [place];  Service: Orthopedics;  Laterality: Left;  Left Shoulder Arthroscopy, Debridement, Biceps Tenotomy  . THYROIDECTOMY N/A 06/12/2015   Procedure: TOTAL THYROIDECTOMY;  Surgeon: Armandina Gemma, MD;  Location: [place];  Service: General;  Laterality: N/A;   History   Social History  . Marital Status: Married    Spouse Name: N/A    Number of Children: 2   Occupational History  . retired-assistant teacher    Social History Main Topics  . Smoking status: Never Smoker   . Smokeless tobacco: Not on file  . Alcohol Use: No  . Drug Use: No   Current Outpatient Medications on File Prior to Visit  Medication Sig  Dispense Refill  . amLODipine (NORVASC) 5 MG tablet Take 1 tablet (5 mg total) by mouth daily. 30 tablet 9  . aspirin 81 MG chewable tablet Chew 81 mg by mouth daily.    . Cholecalciferol (VITAMIN D3) 5000 units CAPS Take 5,000 Units by mouth daily.    . fenofibrate micronized (LOFIBRA) 200 MG capsule Take 200 mg by mouth daily before breakfast.    . levothyroxine (SYNTHROID) 150 MCG tablet Take 150 mcg by mouth daily before breakfast.    . losartan (COZAAR) 50 MG tablet Take 1 tablet (50 mg total) by mouth daily. 90 tablet 3  . nitroGLYCERIN (NITROSTAT) 0.4 MG SL tablet Place 1 tablet (0.4 mg total) under the tongue every 5 (five) minutes as needed for chest pain. 25 tablet 4  . PARoxetine (PAXIL) 20 MG tablet Take 60 mg by mouth daily.     . prednisoLONE acetate (PRED FORTE) 1 % ophthalmic suspension Place 1 drop into both eyes daily. Use as directed     No current facility-administered medications on file prior to visit.   Allergies  Allergen Reactions  . Percocet [Oxycodone-Acetaminophen] Shortness Of Breath and Other (See Comments)    Pt couldn't breathe or catch her breath  . Codeine Other (See Comments)    hyperactivity  . Other Other (See Comments)    Some pain medication given at [facility] for surgery caused hallucinations   . Phenergan [Promethazine Hcl] Other (See Comments)    Hallucinations   . Clonidine Derivatives     Bradycardia (06/2017)  . Hydralazine     Headache/dizzy  . Metoprolol     Bradycardia 06/2017    Family History  Problem Relation Age of Onset  . Stomach cancer Mother   . Cancer Sister        intestinal   PE: BP (!) 148/100   Pulse 77  Ht 4' 11.5" (1.511 m)   Wt 168 lb (76.2 kg)   SpO2 95%   BMI 33.36 kg/m   Wt Readings from Last 3 Encounters:  09/22/19 168 lb (76.2 kg)  08/25/19 172 lb 3.2 oz (78.1 kg)  05/10/19 178 lb 2.1 oz (80.8 kg)   Constitutional: overweight, in NAD Eyes: PERRLA, EOMI, no exophthalmos ENT: moist mucous  membranes, no thyromegaly, no cervical lymphadenopathy Cardiovascular: RRR, No MRG, + B LE mild nonpitting edema Respiratory: CTA B Gastrointestinal: abdomen soft, NT, ND, BS+ Musculoskeletal: no deformities, strength intact in all 4 Skin: moist, warm, no rashes Neurological: no tremor with outstretched hands, DTR normal in all 4  Assessment: 1. Postsurgical hypothyroidism  2. Hypercalcemia - ? primary hyperparathyroidism (PTH not increased...)  3.  Vitamin D Insufficiency  Plan: 1. Postsurgical hypothyroidism -She had a very high TSH on 02/21/2019: 15.7.  At that time she was not taking the levothyroxine correctly and missing doses.  At last visit she was taking calcium and multivitamins close to levothyroxine and we moved them later in the day.  However, her TSH still returned high when it was checked during her admissions for encephalopathy in 07/2019.  We increased the dose of levothyroxine at that time. - latest thyroid labs reviewed with pt >> still slightly high: Lab Results  Component Value Date   TSH 5.89 (H) 07/20/2019   - she continues on LT4 150 mcg daily - pt feels good on this dose. - we discussed about taking the thyroid hormone every day, with water, >30 minutes before breakfast, separated by >4 hours from acid reflux medications, calcium, iron, multivitamins. Pt. is taking it correctly. - will check thyroid tests today: TSH and fT4 - If labs are abnormal, she will need to return for repeat TFTs in 1.5 months  2. Hypercalcemia -Calcium levels remained normal after parathyroidectomy, interestingly -Reviewed previous investigation: Patient has had several instances of elevated calcium, with the highest being at 11.1.  Of note, her PTH levels were repeatedly checked and they were normal.  A PTHrp level was normal, multiple myeloma work-up was negative, vitamin A level was normal, vitamin D was normal or minimally low, she had a high 1, 25 dihydroxy vitamin D with a normal  phosphorus.  Her urinary calcium was high in the past.  A technetium sestamibi scan, thyroid ultrasound, and neck CT did not show any parathyroid masses. -She has no apparent complications from hypercalcemia: No history of nephrolithiasis, abdominal pain, bone pain -Latest calcium and phosphorus levels were normal: Lab Results  Component Value Date   CALCIUM 9.2 05/11/2019   PHOS 3.2 12/09/2013  -No further investigation is needed for this  2.  Vitamin D insufficiency -She has a history of low vitamin D, improved after restarting supplementation -She continues on 5000 units vitamin D daily -Latest vitamin D level was from in 07/2019 and this was normal  Needs refills.  Office Visit on 09/22/2019  Component Date Value Ref Range Status  . TSH 09/22/2019 3.96  0.35 - 4.50 uIU/mL Final  . Free T4 09/22/2019 1.25  0.60 - 1.60 ng/dL Final   Comment: Specimens from patients who are undergoing biotin therapy and /or ingesting biotin supplements may contain high levels of biotin.  The higher biotin concentration in these specimens interferes with this Free T4 assay.  Specimens that contain high levels  of biotin may cause false high results for this Free T4 assay.  Please interpret results in light of the total clinical presentation  of the patient.     Normal TFTs.  Philemon Kingdom, MD PhD [facility] Endocrinology

## 2019-09-23 ENCOUNTER — Telehealth: Payer: Self-pay

## 2019-09-23 LAB — TSH: TSH: 3.96 u[IU]/mL (ref 0.35–4.50)

## 2019-09-23 LAB — T4, FREE: Free T4: 1.25 ng/dL (ref 0.60–1.60)

## 2019-09-23 MED ORDER — LEVOTHYROXINE SODIUM 150 MCG PO TABS: 150.0000 ug | ORAL_TABLET | Freq: Every day | ORAL | 3 refills | Status: DC

## 2019-09-23 NOTE — Telephone Encounter (Signed)
-----   Message from Carlus Pavlov, MD sent at 09/23/2019 12:41 PM EDT ----- Efraim Kaufmann, can you please call pt: Kimberly [name] tests are normal.  I refilled her levothyroxine.

## 2019-09-23 NOTE — Telephone Encounter (Signed)
Patient notified

## 2019-09-28 ENCOUNTER — Ambulatory Visit: Payer: Medicare PPO | Admitting: Physician Assistant

## 2019-10-10 ENCOUNTER — Encounter: Payer: Self-pay | Admitting: Physician Assistant

## 2019-10-10 NOTE — Progress Notes (Signed)
Cardiology Office Note    Date:  10/12/2019   ID:  [place] Kimberly [name], DOB 20-Mar-1941, MRN [id number]  PCP:  Aretta Nip, MD  Cardiologist:  Fransico Him, MD  Electrophysiologist:  None   Chief Complaint: f/u BP  History of Present Illness:   Kimberly [name] is a 79 y.o. female with history of normal cath in 2010, prior junctional bradycardia, chronic diastolic HF with chronic edema, HTN, hypothyroidism, OSA (followed by pulmonology), hyperlipidemia (followed by PCP), hearing loss, depression, probable CKD stage III by labs, and prior medication confusion who presents for 4 week follow-up at the request of Dr. Radford Pax.  To recap prior history, she had a cath in 2010 due to CP showing normal coronary arteries with elevated LVEDP felt to be due to diastolic CHF vs. microvascular angina. Her chest pain resolved for a while but reoccurred and nuclear stress test 09/2014 was normal.  Her blood pressure management has been tumultuous in the past, with notes outlining that she is hard of hearing and gets confused easily. She has been on varying doses of amlodipine over the years as well as lisinopril, HCTZ, and chlorthalidone at one point. Amlodipine had to be reduced to 87m daily due to lower extremity edema. She had rise in creatinine with chlorthalidone 240mdaily so this was discontinued. At some point HCTZ was stopped and ACEI was changed to ARB. In 06/2017 she was admitted with junctional bradycardia therefore clonidine and metoprolol were stopped. There was mention of possible accidental overdose raised in hospital notes as daughter questioned potential memory issues. She was trialed on hydralazine but developed headache/dizziness after taking hydralazine so this was stopped. I met her for follow-up in 02/2019. Visit was very challenging as depression about her social stiuation was clearly at the forefront of her mind, very tearful. Losartan was increased and she was strongly urged to f/u with  primary care given psychosocial issues and also concern that too many different prescribers adjusting her medicines could be detrimental. She required admission 04/2019 for acute metabolic encephalopathy and hallucinations felt related to UTI. She was seen back by Dr. TuRadford Paxn 08/2019 and blood pressure was poorly controlled. Per Dr. TuTheodosia Blenderote, "she is no longer taking losartan or diuretic but does not know why.  At first she told me she lost her medications.  Then she said that her children told her to stop the meds and then said that she thinks Dr. RaZadie Rhineold her to stop them." Losartan was restarted at 5061maily and she was advised to f/u primary care. Last labs personally reviewed 09/2019 normal thyroid, 05/2019 K 4.0, Cr 1.270, 04/2019 Hgb 12.2.  She is seen back for follow-up and BP remains high. Similar to prior visit, she is visibly emotional the moment we begin to speak about how she is doing. She still cares for her husband of 60 4ars 24/7. She states she hardly gets rest because he requires constant supervision, and tends to be up throughout the night instead. She reports limited social support from her family nearby, that her daughter will sometimes decline to assist with transportation or assistance. She is tearful that her primary care ordered fasting labs for her cholesterol yet she cannot find anyone to take her to the doctor that early in the morning to check them. She reports compliance with her medication but due to prior issues outlined above, we cannot be totally sure. She denies any angina or dyspnea, citing that she actually feels better being "on  the go - I just can't slow down or I'll fall to pieces."   Past Medical History:  Diagnosis Date  . Acid reflux   . Anxiety   . Arthritis   . Back spasm   . Bradycardia    a. junctional and sinus brady during admission 06/2017 requiring cessation of beta blocker and clonidine.  . Chronic diastolic heart failure ([place]) 03/20/2015  .  Chronic edema   . CKD (chronic kidney disease), stage III   . Depression   . Diabetes mellitus without complication (Kimberly [name])    diet controled  . Fuch's endothelial dystrophy    bilateral eyes  . Headache    occ  . HOH (hard of hearing)    wears hearing aid in left ear  . Hypercholesteremia   . Hypertensive heart disease with CHF ([place]) 09/11/2014  . Insomnia   . Junctional bradycardia   . Mild mitral regurgitation   . Mild tricuspid regurgitation   . Normal coronary arteries 2010   a. 2010 cath with no obstructive epicardial disease.  Done for CP which was felt to be due to microvascular angina or diastolic HF.  . OSA on CPAP    Followed by Dr. Keturah [name]  . Patient's other noncompliance with medication regimen    per notes, gets confused easily with medications, difficult compliance     Past Surgical History:  Procedure Laterality Date  . ABDOMINAL HYSTERECTOMY    . APPENDECTOMY    . CARDIAC CATHETERIZATION     no CAD  . CESAREAN SECTION     x 2  . CHOLECYSTECTOMY    . COLONOSCOPY    . EYE SURGERY Right    implant  . ROTATOR CUFF REPAIR Left   . SHOULDER ARTHROSCOPY WITH SUBACROMIAL DECOMPRESSION AND BICEP TENDON REPAIR Left 08/24/2012   Procedure: SHOULDER ARTHROSCOPY WITH SUBACROMIAL DECOMPRESSION AND BICEP TENDON REPAIR;  Surgeon: Kimberly Pel, MD;  Location: [facility];  Service: Orthopedics;  Laterality: Left;  Left Shoulder Arthroscopy, Debridement, Biceps Tenotomy  . THYROIDECTOMY N/A 06/12/2015   Procedure: TOTAL THYROIDECTOMY;  Surgeon: Kimberly Gemma, MD;  Location: [facility];  Service: General;  Laterality: N/A;    Current Medications: Current Meds  Medication Sig  . amLODipine (NORVASC) 5 MG tablet Take 1 tablet (5 mg total) by mouth daily.  Marland Kitchen aspirin 81 MG chewable tablet Chew 81 mg by mouth daily.  . Cholecalciferol (VITAMIN D3) 5000 units CAPS Take 5,000 Units by mouth daily.  . fenofibrate micronized (LOFIBRA) 200 MG capsule Take 200 mg by mouth daily before  breakfast.  . levothyroxine (SYNTHROID) 150 MCG tablet Take 1 tablet (150 mcg total) by mouth daily before breakfast.  . losartan (COZAAR) 50 MG tablet Take 1 tablet (50 mg total) by mouth daily.  . nitroGLYCERIN (NITROSTAT) 0.4 MG SL tablet Place 1 tablet (0.4 mg total) under the tongue every 5 (five) minutes as needed for chest pain.  Marland Kitchen PARoxetine (PAXIL) 20 MG tablet Take 60 mg by mouth daily.   . prednisoLONE acetate (PRED FORTE) 1 % ophthalmic suspension Place 1 drop into both eyes daily. Use as directed     Allergies:   Percocet [oxycodone-acetaminophen], Codeine, Other, Phenergan [promethazine hcl], Clonidine derivatives, Hydralazine, and Metoprolol   Social History   Socioeconomic History  . Marital status: Married    Spouse name: Not on file  . Number of children: 2  . Years of education: Not on file  . Highest education level: Not on file  Occupational History  .  Occupation: Secondary school teacher  Tobacco Use  . Smoking status: Never Smoker  . Smokeless tobacco: Never Used  Substance and Sexual Activity  . Alcohol use: No    Alcohol/week: 0.0 standard drinks  . Drug use: No  . Sexual activity: Not on file  Other Topics Concern  . Not on file  Social History Narrative  . Not on file   Social Determinants of Health   Financial Resource Strain:   . Difficulty of Paying Living Expenses:   Food Insecurity:   . Worried About Charity fundraiser in the Last Year:   . Arboriculturist in the Last Year:   Transportation Needs:   . Film/video editor (Medical):   Marland Kitchen Lack of Transportation (Non-Medical):   Physical Activity:   . Days of Exercise per Week:   . Minutes of Exercise per Session:   Stress:   . Feeling of Stress :   Social Connections:   . Frequency of Communication with Friends and Family:   . Frequency of Social Gatherings with Friends and Family:   . Attends Religious Services:   . Active Member of Clubs or Organizations:   . Attends Theatre manager Meetings:   Marland Kitchen Marital Status:      Family History:  The patient's family history includes Cancer in her sister; Stomach cancer in her mother.  ROS:   Please see the history of present illness. Otherwise, review of systems is positive for psychosocial stress. All other systems are reviewed and otherwise negative.    EKGs/Labs/Other Studies Reviewed:    Studies reviewed are outlined and summarized above. Reports included below if pertinent.  2D echo 06/2017 - Left ventricle: The cavity size was normal. Wall thickness was  increased in a pattern of mild LVH. Systolic function was normal.  The estimated ejection fraction was in the range of 55% to 60%.  Wall motion was normal; there were no regional wall motion  abnormalities. Features are consistent with a pseudonormal left  ventricular filling pattern, with concomitant abnormal relaxation  and increased filling pressure (grade 2 diastolic dysfunction).  Doppler parameters are consistent with high ventricular filling  pressure.  - Mitral valve: There was mild regurgitation.  - Left atrium: The atrium was severely dilated.  - Right ventricle: The cavity size was mildly dilated. Wall  thickness was normal.  - Tricuspid valve: There was mild regurgitation.  - Pericardium, extracardiac: A trivial pericardial effusion was  identified posterior to the heart.     EKG:  EKG is not ordered today  Recent Labs: 05/10/2019: ALT 16 05/11/2019: BUN 19; Creatinine, Ser 1.12; Hemoglobin 12.2; Platelets 238; Potassium 3.4; Sodium 143 09/22/2019: TSH 3.96  Recent Lipid Panel    Component Value Date/Time   CHOL 152 10/02/2015 0901   TRIG 169 (H) 10/02/2015 0901   HDL 30 (L) 10/02/2015 0901   CHOLHDL 5.1 (H) 10/02/2015 0901   VLDL 34 (H) 10/02/2015 0901   LDLCALC 88 10/02/2015 0901    PHYSICAL EXAM:    VS:  BP (!) 172/74   Pulse 74   Ht 5' (1.524 m)   Wt 172 lb 1.9 oz (78.1 kg)   SpO2 96%   BMI 33.61  kg/m   BMI: Body mass index is 33.61 kg/m.  GEN: Well nourished, well developed obese WF in no acute distress HEENT: normocephalic, atraumatic Neck: no JVD, carotid bruits, or masses Cardiac: RRR; no murmurs, rubs, or gallops, bilateral soft chronic appearing puffy ankle edema  Respiratory:  clear to auscultation bilaterally, normal work of breathing GI: soft, nontender, nondistended, + BS MS: no deformity or atrophy Skin: warm and dry, no rash Neuro:  Alert and Oriented x 3, Strength and sensation are intact, follows commands Psych: tearful affect, depressed appearing, no SI/HI  Wt Readings from Last 3 Encounters:  10/12/19 172 lb 1.9 oz (78.1 kg)  09/22/19 168 lb (76.2 kg)  08/25/19 172 lb 3.2 oz (78.1 kg)     ASSESSMENT & PLAN:   1. Essential hypertension - blood pressure remains poorly controlled. I worry about making too many changes at one time given Ms. Cooperwood's historical difficulties with this. What is most concerning to me is that it seems impossible to help her specifically within the walls of this practice. It is one thing for [place] to provide medication recommendations but as we have seen before, these rarely play out in an advantageous way that truly affects this patient. Ms. Tolsma cites transportation issues and social issues as her #1 stressor, which has even made it hard to get to the pharmacy or get to her doctors' appointments. I think she would benefit significantly from enrollment in a home-based primary care program to not only address the issue of transportation but also provide social support, medication reconciliation, access to home labwork, and close management of blood pressure. She is extremely interested in such a program and expresses gratitude for referral. We will request referral to [facility] for this. Regarding her high BP, I did instruct her to increase losartan to 162m daily. Will update her BMET today. Would appreciate [facility] taking over her blood  pressure control if they feel comfortable since they could be "in the home" seeing the actual reality of her medication set-up. She is also in need of lipid check should she decide to participate in the program. Otherwise she does not specifically need cardiology input for this. 2. Caregiver stress - see above. Refer to [facility] for resources. This is prominently as important as any of her medical issues today. 3. Chronic diastolic CHF - patient feels chronic edema is at baseline. No changes made today aside from what was noted above. 4. History of bradycardia - without recurrence. Question possibly due to unintentional medication overdose at that time. 5. Mild MR/TR - of no significant hemodynamic consequence at this time. Continue to follow clinically; f/u echo would be due around 2024 if clinically indicated.  Disposition: F/u in 3 months to see Dr. TRadford Paxto ensure patient is back on the right track.   Medication Adjustments/Labs and Tests Ordered: Current medicines are reviewed at length with the patient today.  Concerns regarding medicines are outlined above. Medication changes, Labs and Tests ordered today are summarized above and listed in the Patient Instructions accessible in Encounters.   Signed, DCharlie Pitter PA-C  10/12/2019 [facility] [address] [place]  [id number]: [phone] Fax: [phone]

## 2019-10-12 ENCOUNTER — Encounter (INDEPENDENT_AMBULATORY_CARE_PROVIDER_SITE_OTHER): Payer: Self-pay

## 2019-10-12 ENCOUNTER — Encounter: Payer: Self-pay | Admitting: Physician Assistant

## 2019-10-12 ENCOUNTER — Ambulatory Visit: Payer: Medicare PPO | Admitting: Physician Assistant

## 2019-10-12 ENCOUNTER — Other Ambulatory Visit: Payer: Self-pay

## 2019-10-12 VITALS — BP 172/74 | HR 74 | Ht 60.0 in | Wt 172.1 lb

## 2019-10-12 DIAGNOSIS — Z636 Dependent relative needing care at home: Secondary | ICD-10-CM

## 2019-10-12 DIAGNOSIS — Z87898 Personal history of other specified conditions: Secondary | ICD-10-CM | POA: Diagnosis not present

## 2019-10-12 DIAGNOSIS — I340 Nonrheumatic mitral (valve) insufficiency: Secondary | ICD-10-CM

## 2019-10-12 DIAGNOSIS — I071 Rheumatic tricuspid insufficiency: Secondary | ICD-10-CM | POA: Diagnosis not present

## 2019-10-12 DIAGNOSIS — I10 Essential (primary) hypertension: Secondary | ICD-10-CM

## 2019-10-12 DIAGNOSIS — I5032 Chronic diastolic (congestive) heart failure: Secondary | ICD-10-CM | POA: Diagnosis not present

## 2019-10-12 NOTE — Patient Instructions (Addendum)
Medication Instructions:  Your physician recommends that you continue on your current medications as directed. Please refer to the Current Medication list given to you today.  *If you need a refill on your cardiac medications before your next appointment, please call your pharmacy*   Lab Work: TODAY:  BMET  If you have labs (blood work) drawn today and your tests are completely normal, you will receive your results only by: Marland Kitchen MyChart Message (if you have MyChart) OR . A paper copy in the mail If you have any lab test that is abnormal or we need to change your treatment, we will call you to review the results.   Testing/Procedures: None ordered   Follow-Up: At [facility], you and your health needs are our priority.  As part of our continuing mission to provide you with exceptional heart care, we have created designated Provider Care Teams.  These Care Teams include your primary Cardiologist (physician) and Advanced Practice Providers (APPs -  Physician Assistants and Nurse Practitioners) who all work together to provide you with the care you need, when you need it.  We recommend signing up for the patient portal called "MyChart".  Sign up information is provided on this After Visit Summary.  MyChart is used to connect with patients for Virtual Visits (Telemedicine).  Patients are able to view lab/test results, encounter notes, upcoming appointments, etc.  Non-urgent messages can be sent to your provider as well.   To learn more about what you can do with MyChart, go to [email or web address].    Your next appointment:   3 month(s)   01/12/20 ARRIVE AT 1:45 TO SEE [name]  The format for your next appointment:   In Person  Provider:   Armanda Magic, MD   Other Instructions

## 2019-10-13 ENCOUNTER — Other Ambulatory Visit: Payer: Self-pay

## 2019-10-13 ENCOUNTER — Telehealth: Payer: Self-pay

## 2019-10-13 DIAGNOSIS — I10 Essential (primary) hypertension: Secondary | ICD-10-CM

## 2019-10-13 LAB — BASIC METABOLIC PANEL
BUN/Creatinine Ratio: 21 (ref 12–28)
BUN: 25 mg/dL (ref 8–27)
CO2: 27 mmol/L (ref 20–29)
Calcium: 9.7 mg/dL (ref 8.7–10.3)
Chloride: 105 mmol/L (ref 96–106)
Creatinine, Ser: 1.2 mg/dL — ABNORMAL HIGH (ref 0.57–1.00)
GFR calc Af Amer: 50 mL/min/{1.73_m2} — ABNORMAL LOW (ref >59)
GFR calc non Af Amer: 43 mL/min/{1.73_m2} — ABNORMAL LOW (ref >59)
Glucose: 83 mg/dL (ref 65–99)
Potassium: 4 mmol/L (ref 3.5–5.2)
Sodium: 145 mmol/L — ABNORMAL HIGH (ref 134–144)

## 2019-10-13 NOTE — Telephone Encounter (Signed)
[facility] Home Visit Follow Up Request   Date of Request ([place]):  October 13, 2019  Requesting Provider:  Bernette Mayers, PA-C    Agency Requested:    [facility] Contact:  Alcide Clever, NP [address] [place], [place] [address] Phone #:  [phone] Fax #:  [phone]  Patient Demographic Information: Name:  Kimberly [name] Age:  79 y.o.   DOB:  1940/11/05  MRN:  [id number]   Home visit progress note(s), lab results, telemetry strips, etc were reviewed.  Provider Recommendations: Labs: BMET - This is to add onto initital request for home health services  Follow up home services requested:  Labs:  BMET in one week

## 2019-10-13 NOTE — Telephone Encounter (Signed)
[facility] Visit Initial Request  Date of Request ([place]):  October 13, 2019  Requesting Provider:  Melina Copa, PA- C    Agency Requested:    [facility] Contact:  Glory Buff, NP [place], [place] [address] Phone #:  [phone] Fax #:  [phone]  Patient Demographic Information: Name:  Kimberly [name] Age:  79 y.o.   DOB:  Aug 31, 1940  MRN:  [id number]   Address:   [address] [place] [address]   Phone Numbers:   Home Phone [phone]     Emergency Contact Information on File:   Contact Information    Name Relation Home Work Mobile   [place] L Spouse [phone]     [place] Malling Daughter   [phone]   [place] [facility] Daughter  [phone]      The above family members may be contacted for information on this patient (review DPR on file):  Yes    Patient Clinical Information:  Primary Care Provider:  Aretta Nip, MD  Primary Cardiologist:  Fransico Him, MD  Primary Electrophysiologist:  None   Past Medical Hx: Kimberly [name]  has a past medical history of Acid reflux, Anxiety, Arthritis, Back spasm, Bradycardia, Chronic diastolic heart failure ([place]) (03/20/2015), Chronic edema, CKD (chronic kidney disease), stage III, Depression, Diabetes mellitus without complication ([place]), Fuch's endothelial dystrophy, Headache, HOH (hard of hearing), Hypercholesteremia, Hypertensive heart disease with CHF ([place]) (09/11/2014), Insomnia, Junctional bradycardia, Mild mitral regurgitation, Mild tricuspid regurgitation, Normal coronary arteries (2010), OSA on CPAP, and Patient's other noncompliance with medication regimen.   Allergies: She is allergic to percocet [oxycodone-acetaminophen]; codeine; other; phenergan [promethazine hcl]; clonidine derivatives; hydralazine; and metoprolol.   Medications: Current Outpatient Medications on File Prior to Visit  Medication Sig  . amLODipine (NORVASC) 5 MG tablet  Take 1 tablet (5 mg total) by mouth daily.  Marland Kitchen aspirin 81 MG chewable tablet Chew 81 mg by mouth daily.  . Cholecalciferol (VITAMIN D3) 5000 units CAPS Take 5,000 Units by mouth daily.  . fenofibrate micronized (LOFIBRA) 200 MG capsule Take 200 mg by mouth daily before breakfast.  . levothyroxine (SYNTHROID) 150 MCG tablet Take 1 tablet (150 mcg total) by mouth daily before breakfast.  . losartan (COZAAR) 50 MG tablet Take 1 tablet (50 mg total) by mouth daily.  . nitroGLYCERIN (NITROSTAT) 0.4 MG SL tablet Place 1 tablet (0.4 mg total) under the tongue every 5 (five) minutes as needed for chest pain.  Marland Kitchen PARoxetine (PAXIL) 20 MG tablet Take 60 mg by mouth daily.   . prednisoLONE acetate (PRED FORTE) 1 % ophthalmic suspension Place 1 drop into both eyes daily. Use as directed   No current facility-administered medications on file prior to visit.     Social Hx: She  reports that she has never smoked. She has never used smokeless tobacco. She reports that she does not drink alcohol or use drugs.    Diagnosis/Reason for Visit:   IN DESPERATE NEED OF HOME-BASED PRIMARY CARE - HAS TRANSPORTATION ISSUE AND PSYCHOSOCIAL STRESSORS, DIFFICULT TO CONTROL BLOOD PRESSURE BASED ON VARIABLE COMPLIANCE AND PATIENT CONFUSION IN THE PAST. PATIENT IS PRIMARY CARE GIVER FOR DEBILITATED HUSBAND AND REPOTS LIMITED FAMILY WILLINGNESS TO HELP HER SEEK HER OWN CARE.  Services Requested:  Vital Signs (BP, Pulse, O2, Weight)  Physical Exam  Medication Reconciliation   Assess for Other rehab/Nursing Needs  Assess for Food Insecurity  Social work involvement as needed for caregiver stress  # of Visits  Needed/Frequency per Week: ASAP - not emergent, but the sooner we can get her help, the better I think she should be considered as a patient for routine ongoing home-based primary care

## 2019-10-20 DIAGNOSIS — I10 Essential (primary) hypertension: Secondary | ICD-10-CM | POA: Diagnosis not present

## 2019-10-20 LAB — BASIC METABOLIC PANEL
BUN/Creatinine Ratio: 27 (ref 12–28)
BUN: 29 mg/dL — ABNORMAL HIGH (ref 8–27)
CO2: 29 mmol/L (ref 20–29)
Calcium: 10 mg/dL (ref 8.7–10.3)
Chloride: 104 mmol/L (ref 96–106)
Creatinine, Ser: 1.07 mg/dL — ABNORMAL HIGH (ref 0.57–1.00)
GFR calc Af Amer: 57 mL/min/{1.73_m2} — ABNORMAL LOW (ref >59)
GFR calc non Af Amer: 50 mL/min/{1.73_m2} — ABNORMAL LOW (ref >59)
Glucose: 176 mg/dL — ABNORMAL HIGH (ref 65–99)
Potassium: 4 mmol/L (ref 3.5–5.2)
Sodium: 140 mmol/L (ref 134–144)

## 2019-11-21 ENCOUNTER — Telehealth: Payer: Self-pay | Admitting: Cardiology

## 2019-11-21 NOTE — Telephone Encounter (Signed)
*  STAT* If patient is at the pharmacy, call can be transferred to refill team.   1. Which medications need to be refilled? (please list name of each medication and dose if known)  losartan (COZAAR) 50 MG tablet nitroGLYCERIN (NITROSTAT) 0.4 MG SL tablet  2. Which pharmacy/location (including street and city if local pharmacy) is medication to be sent to? [facility]  3. Do they need a 30 day or 90 day supply? 90 day

## 2019-11-22 MED ORDER — LOSARTAN POTASSIUM 50 MG PO TABS: 50.0000 mg | ORAL_TABLET | Freq: Every day | ORAL | 3 refills | Status: DC

## 2019-11-22 MED ORDER — NITROGLYCERIN 0.4 MG SL SUBL: 0.4000 mg | SUBLINGUAL_TABLET | SUBLINGUAL | 6 refills | Status: DC | PRN

## 2019-11-22 NOTE — Telephone Encounter (Signed)
Pt's medications were sent to pt's pharmacy as requested. Confirmation received.  

## 2019-12-01 DIAGNOSIS — G4733 Obstructive sleep apnea (adult) (pediatric): Secondary | ICD-10-CM | POA: Diagnosis not present

## 2019-12-01 DIAGNOSIS — E1169 Type 2 diabetes mellitus with other specified complication: Secondary | ICD-10-CM | POA: Diagnosis not present

## 2019-12-01 DIAGNOSIS — E785 Hyperlipidemia, unspecified: Secondary | ICD-10-CM | POA: Diagnosis not present

## 2019-12-01 DIAGNOSIS — E7800 Pure hypercholesterolemia, unspecified: Secondary | ICD-10-CM | POA: Diagnosis not present

## 2019-12-01 DIAGNOSIS — E119 Type 2 diabetes mellitus without complications: Secondary | ICD-10-CM | POA: Diagnosis not present

## 2019-12-01 DIAGNOSIS — I10 Essential (primary) hypertension: Secondary | ICD-10-CM | POA: Diagnosis not present

## 2019-12-01 DIAGNOSIS — F339 Major depressive disorder, recurrent, unspecified: Secondary | ICD-10-CM | POA: Diagnosis not present

## 2019-12-01 DIAGNOSIS — G4700 Insomnia, unspecified: Secondary | ICD-10-CM | POA: Diagnosis not present

## 2019-12-01 DIAGNOSIS — E039 Hypothyroidism, unspecified: Secondary | ICD-10-CM | POA: Diagnosis not present

## 2019-12-07 DIAGNOSIS — K432 Incisional hernia without obstruction or gangrene: Secondary | ICD-10-CM | POA: Diagnosis not present

## 2019-12-16 DIAGNOSIS — I5032 Chronic diastolic (congestive) heart failure: Secondary | ICD-10-CM | POA: Diagnosis not present

## 2019-12-16 DIAGNOSIS — I10 Essential (primary) hypertension: Secondary | ICD-10-CM | POA: Diagnosis not present

## 2019-12-16 DIAGNOSIS — I119 Hypertensive heart disease without heart failure: Secondary | ICD-10-CM | POA: Diagnosis not present

## 2019-12-21 DIAGNOSIS — Z1159 Encounter for screening for other viral diseases: Secondary | ICD-10-CM | POA: Diagnosis not present

## 2019-12-22 DIAGNOSIS — R001 Bradycardia, unspecified: Secondary | ICD-10-CM | POA: Diagnosis not present

## 2019-12-22 DIAGNOSIS — I10 Essential (primary) hypertension: Secondary | ICD-10-CM | POA: Diagnosis not present

## 2019-12-22 DIAGNOSIS — N1831 Chronic kidney disease, stage 3a: Secondary | ICD-10-CM | POA: Diagnosis not present

## 2019-12-23 ENCOUNTER — Telehealth: Payer: Self-pay | Admitting: *Deleted

## 2019-12-23 ENCOUNTER — Encounter (HOSPITAL_COMMUNITY): Payer: Self-pay

## 2019-12-23 ENCOUNTER — Emergency Department (HOSPITAL_COMMUNITY)
Admission: EM | Admit: 2019-12-23 | Discharge: 2019-12-24 | Disposition: A | Discharge status: Home / Self Care | Payer: Medicare PPO | Attending: Emergency Medicine | Admitting: Emergency Medicine

## 2019-12-23 ENCOUNTER — Emergency Department (HOSPITAL_COMMUNITY): Payer: Medicare PPO

## 2019-12-23 DIAGNOSIS — N1831 Chronic kidney disease, stage 3a: Secondary | ICD-10-CM | POA: Insufficient documentation

## 2019-12-23 DIAGNOSIS — J341 Cyst and mucocele of nose and nasal sinus: Secondary | ICD-10-CM | POA: Diagnosis not present

## 2019-12-23 DIAGNOSIS — I10 Essential (primary) hypertension: Secondary | ICD-10-CM | POA: Diagnosis not present

## 2019-12-23 DIAGNOSIS — E1122 Type 2 diabetes mellitus with diabetic chronic kidney disease: Secondary | ICD-10-CM | POA: Diagnosis not present

## 2019-12-23 DIAGNOSIS — I5032 Chronic diastolic (congestive) heart failure: Secondary | ICD-10-CM | POA: Diagnosis not present

## 2019-12-23 DIAGNOSIS — Z7982 Long term (current) use of aspirin: Secondary | ICD-10-CM | POA: Diagnosis not present

## 2019-12-23 DIAGNOSIS — Z79899 Other long term (current) drug therapy: Secondary | ICD-10-CM | POA: Insufficient documentation

## 2019-12-23 DIAGNOSIS — Z79890 Hormone replacement therapy (postmenopausal): Secondary | ICD-10-CM | POA: Diagnosis not present

## 2019-12-23 DIAGNOSIS — I130 Hypertensive heart and chronic kidney disease with heart failure and stage 1 through stage 4 chronic kidney disease, or unspecified chronic kidney disease: Secondary | ICD-10-CM | POA: Insufficient documentation

## 2019-12-23 DIAGNOSIS — R0602 Shortness of breath: Secondary | ICD-10-CM | POA: Diagnosis not present

## 2019-12-23 DIAGNOSIS — E039 Hypothyroidism, unspecified: Secondary | ICD-10-CM | POA: Insufficient documentation

## 2019-12-23 DIAGNOSIS — J811 Chronic pulmonary edema: Secondary | ICD-10-CM | POA: Diagnosis not present

## 2019-12-23 DIAGNOSIS — I6529 Occlusion and stenosis of unspecified carotid artery: Secondary | ICD-10-CM | POA: Diagnosis not present

## 2019-12-23 DIAGNOSIS — R42 Dizziness and giddiness: Secondary | ICD-10-CM | POA: Insufficient documentation

## 2019-12-23 DIAGNOSIS — I517 Cardiomegaly: Secondary | ICD-10-CM | POA: Diagnosis not present

## 2019-12-23 DIAGNOSIS — R001 Bradycardia, unspecified: Secondary | ICD-10-CM | POA: Diagnosis not present

## 2019-12-23 LAB — CBC
HCT: 35.7 % — ABNORMAL LOW (ref 36.0–46.0)
Hemoglobin: 11.5 g/dL — ABNORMAL LOW (ref 12.0–15.0)
MCH: 29.6 pg (ref 26.0–34.0)
MCHC: 32.2 g/dL (ref 30.0–36.0)
MCV: 91.8 fL (ref 80.0–100.0)
Platelets: 221 10*3/uL (ref 150–400)
RBC: 3.89 MIL/uL (ref 3.87–5.11)
RDW: 13.1 % (ref 11.5–15.5)
WBC: 7.5 10*3/uL (ref 4.0–10.5)
nRBC: 0 % (ref 0.0–0.2)

## 2019-12-23 LAB — BASIC METABOLIC PANEL
Anion gap: 12 (ref 5–15)
BUN: 37 mg/dL — ABNORMAL HIGH (ref 8–23)
CO2: 23 mmol/L (ref 22–32)
Calcium: 9.9 mg/dL (ref 8.9–10.3)
Chloride: 104 mmol/L (ref 98–111)
Creatinine, Ser: 1.57 mg/dL — ABNORMAL HIGH (ref 0.44–1.00)
GFR calc Af Amer: 36 mL/min — ABNORMAL LOW (ref >60)
GFR calc non Af Amer: 31 mL/min — ABNORMAL LOW (ref >60)
Glucose, Bld: 121 mg/dL — ABNORMAL HIGH (ref 70–99)
Potassium: 3.8 mmol/L (ref 3.5–5.1)
Sodium: 139 mmol/L (ref 135–145)

## 2019-12-23 LAB — BRAIN NATRIURETIC PEPTIDE: B Natriuretic Peptide: 362.4 pg/mL — ABNORMAL HIGH (ref 0.0–100.0)

## 2019-12-23 NOTE — Telephone Encounter (Signed)
   [facility] Pre-operative Risk Assessment    HEARTCARE STAFF: - Please ensure there is not already an duplicate clearance open for this procedure. - Under Visit Info/Reason for Call, type in Other and utilize the format Clearance MM/DD/YY or Clearance TBD. Do not use dashes or single digits. - If request is for dental extraction, please clarify the # of teeth to be extracted.  Request for surgical clearance:  1. What type of surgery is being performed? LAP. LOA UHR w/MESH   2. When is this surgery scheduled? TBD   3. What type of clearance is required (medical clearance vs. Pharmacy clearance to hold med vs. Both)? MEDICAL  4. Are there any medications that need to be held prior to surgery and how long? ASA    5. Practice name and name of physician performing surgery? [facility]; DR. Anne Hahn RAMIREZ   6. What is the office phone number? [phone]   7.   What is the office fax number? [place], CMA  8.   Anesthesia type (None, local, MAC, general) ? GENERAL   Julaine Hua 12/23/2019, 10:05 AM  _________________________________________________________________   (provider comments below)

## 2019-12-23 NOTE — ED Triage Notes (Signed)
Pt arrives POV for eval of SOB onset 1600 this afternoon. Pt reports she had her HR and BP checked by [facility] RN today and noted that her HR was in the 40s and BP was elevated to 190SBP. Pt reports she tried her home CPAP w/ SOB but states she could not get relief. Appears mildly SOB in triage.

## 2019-12-23 NOTE — Telephone Encounter (Signed)
Primary Cardiologist:Traci Turner, MD  Chart reviewed as part of pre-operative protocol coverage. Because of Anum Palecek Tardif's past medical history and time since last visit, he/she will require a follow-up visit in order to better assess preoperative cardiovascular risk.  Pre-op covering staff: - Please schedule appointment and call patient to inform them. - Please contact requesting surgeon's office via preferred method (i.e, phone, fax) to inform them of need for appointment prior to surgery.  If applicable, this message will also be routed to pharmacy pool and/or primary cardiologist for input on holding anticoagulant/antiplatelet agent as requested below so that this information is available at time of patient's appointment.   Ronney Asters, NP  12/23/2019, 1:31 PM

## 2019-12-23 NOTE — Telephone Encounter (Signed)
Please call pt to schedule appt for clearance per Jesse Cleaver, NP. 

## 2019-12-24 ENCOUNTER — Emergency Department (HOSPITAL_COMMUNITY): Payer: Medicare PPO

## 2019-12-24 DIAGNOSIS — I6529 Occlusion and stenosis of unspecified carotid artery: Secondary | ICD-10-CM | POA: Diagnosis not present

## 2019-12-24 DIAGNOSIS — R42 Dizziness and giddiness: Secondary | ICD-10-CM | POA: Diagnosis not present

## 2019-12-24 DIAGNOSIS — J341 Cyst and mucocele of nose and nasal sinus: Secondary | ICD-10-CM | POA: Diagnosis not present

## 2019-12-24 LAB — TROPONIN I (HIGH SENSITIVITY): Troponin I (High Sensitivity): 15 ng/L (ref <18)

## 2019-12-24 MED ORDER — CLONIDINE HCL 0.1 MG PO TABS
0.1000 mg | ORAL_TABLET | Freq: Once | ORAL | Status: AC
  Administered 2019-12-24: 0.1 mg via ORAL
  Filled 2019-12-24: qty 1

## 2019-12-24 MED ORDER — DIPHENHYDRAMINE HCL 50 MG/ML IJ SOLN
12.5000 mg | Freq: Once | INTRAMUSCULAR | Status: AC
  Administered 2019-12-24: 12.5 mg via INTRAVENOUS
  Filled 2019-12-24: qty 1

## 2019-12-24 MED ORDER — HYDRALAZINE HCL 20 MG/ML IJ SOLN
5.0000 mg | Freq: Once | INTRAMUSCULAR | Status: AC
  Administered 2019-12-24: 5 mg via INTRAVENOUS
  Filled 2019-12-24: qty 1

## 2019-12-24 MED ORDER — AMLODIPINE BESYLATE 5 MG PO TABS
10.0000 mg | ORAL_TABLET | Freq: Once | ORAL | Status: AC
  Administered 2019-12-24: 10 mg via ORAL
  Filled 2019-12-24: qty 2

## 2019-12-24 MED ORDER — LOSARTAN POTASSIUM 50 MG PO TABS
100.0000 mg | ORAL_TABLET | Freq: Once | ORAL | Status: AC
  Administered 2019-12-24: 100 mg via ORAL
  Filled 2019-12-24: qty 2

## 2019-12-24 MED ORDER — METOCLOPRAMIDE HCL 5 MG/ML IJ SOLN
10.0000 mg | Freq: Once | INTRAMUSCULAR | Status: AC
  Administered 2019-12-24: 10 mg via INTRAVENOUS
  Filled 2019-12-24: qty 2

## 2019-12-24 NOTE — Discharge Instructions (Signed)
Thank you for allowing me to care for you today in the Emergency Department.   Please follow closely with [name] regarding your blood pressure.  Although your blood pressure was high when you arrived in the emergency department, it improved significantly and was 159/60 at discharge.  Your work-up was otherwise reassuring.  Do not forget-you were given your morning dose of your blood pressure medication in the ER.  You were given amlodipine, clonidine, and losartan.  I want to make sure you do not double up on these medications at home this morning.  You can take 650 mg of Tylenol once every 6 hours as needed for headache at home.  Your headaches more recently have most likely been related to your high blood pressure.  However, your head CT was reassuring today.  Return to the emergency department if you develop significant trouble breathing, new or worsening chest pain, if you pass out, develop new numbness or weakness, if you stop making urine, or other new, concerning symptoms.

## 2019-12-24 NOTE — ED Provider Notes (Signed)
[facility] EMERGENCY DEPARTMENT Provider Note   CSN: [id number] Arrival date & time: 12/23/19  1822     History Chief Complaint  Patient presents with  . Shortness of Breath    Kimberly [name] is a 79 y.o. female with extensive medical history as mentioned below who presents to the emergency department with a chief complaint of elevated blood pressure.  The patient reports that her blood pressure has been significantly elevated for the last week.  She reports that her home health nurse checked her blood pressure today and noticed it was elevated 190 SBP and HR was in the 40s and was advised to go to the ER.  She also reports that she has been having intermittent dizziness, frequent headaches, chest pain that she describes as heaviness that has been intermittent, and shortness of breath over the last week.  She is unable to characterize the dizziness or headaches.  She is endorsing a headache at this time, but no dizziness.  No chest pain today.  She reports compliance with her home antihypertensives, but does not know which medication she is taking.  States that she takes all of her medications in the morning.  No fever, chills, cough, syncope, nausea, vomiting, diarrhea, abdominal pain, numbness, weakness, palpitations, orthopnea.   Patient is a poor historian.  The history is provided by the patient. No language interpreter was used.       Past Medical History:  Diagnosis Date  . Acid reflux   . Anxiety   . Arthritis   . Back spasm   . Bradycardia    a. junctional and sinus brady during admission 06/2017 requiring cessation of beta blocker and clonidine.  . Chronic diastolic heart failure (HCC) 03/20/2015  . Chronic edema   . CKD (chronic kidney disease), stage III   . Depression   . Diabetes mellitus without complication (HCC)    diet controled  . Fuch's endothelial dystrophy    bilateral eyes  . Headache    occ  . HOH (hard of hearing)    wears  hearing aid in left ear  . Hypercholesteremia   . Hypertensive heart disease with CHF (HCC) 09/11/2014  . Insomnia   . Junctional bradycardia   . Mild mitral regurgitation   . Mild tricuspid regurgitation   . Normal coronary arteries 2010   a. 2010 cath with no obstructive epicardial disease.  Done for CP which was felt to be due to microvascular angina or diastolic HF.  . OSA on CPAP    Followed by Dr. Fannie Knee  . Patient's other noncompliance with medication regimen    per notes, gets confused easily with medications, difficult compliance     Patient Active Problem List   Diagnosis Date Noted  . AMS (altered mental status) 05/10/2019  . Chronic kidney disease, stage 3a 05/10/2019  . Accelerated hypertension 05/10/2019  . Sinus bradycardia 06/14/2017  . Junctional bradycardia 06/12/2017  . Essential hypertension 10/30/2015  . Edema extremities 09/25/2015  . Dietary noncompliance 09/25/2015  . S/P thyroidectomy 09/18/2015  . Postsurgical hypothyroidism 09/18/2015  . Vitamin D insufficiency 09/18/2015  . Chronic diastolic heart failure (HCC) 03/20/2015  . Elevated troponin 09/11/2014  . Hypertensive heart disease 09/11/2014  . Rhinitis sicca 07/02/2014  . Hypercalcemia 12/09/2013  . Obstructive sleep apnea 08/29/2013  . Acid reflux     Past Surgical History:  Procedure Laterality Date  . ABDOMINAL HYSTERECTOMY    . APPENDECTOMY    . CARDIAC CATHETERIZATION  no CAD  . CESAREAN SECTION     x 2  . CHOLECYSTECTOMY    . COLONOSCOPY    . EYE SURGERY Right    implant  . ROTATOR CUFF REPAIR Left   . SHOULDER ARTHROSCOPY WITH SUBACROMIAL DECOMPRESSION AND BICEP TENDON REPAIR Left 08/24/2012   Procedure: SHOULDER ARTHROSCOPY WITH SUBACROMIAL DECOMPRESSION AND BICEP TENDON REPAIR;  Surgeon: Cammy CopaGregory Scott Dean, MD;  [facility];  Service: Orthopedics;  Laterality: Left;  Left Shoulder Arthroscopy, Debridement, Biceps Tenotomy  . THYROIDECTOMY N/A 06/12/2015   Procedure:  TOTAL THYROIDECTOMY;  Surgeon: Darnell Levelodd Gerkin, MD;  [facility];  Service: General;  Laterality: N/A;     OB History   No obstetric history on file.     Family History  Problem Relation Age of Onset  . Stomach cancer Mother   . Cancer Sister        intestinal    Social History   Tobacco Use  . Smoking status: Never Smoker  . Smokeless tobacco: Never Used  Vaping Use  . Vaping Use: Never used  Substance Use Topics  . Alcohol use: No    Alcohol/week: 0.0 standard drinks  . Drug use: No    Home Medications Prior to Admission medications   Medication Sig Start Date End Date Taking? Authorizing Provider  amLODipine (NORVASC) 10 MG tablet Take 10 mg by mouth daily. 10/28/19  Yes [provider]  aspirin 81 MG chewable tablet Chew 81 mg by mouth daily.   Yes [provider]  Cholecalciferol (VITAMIN D3) 5000 units CAPS Take 5,000 Units by mouth daily.   Yes [provider]  cloNIDine (CATAPRES) 0.1 MG tablet Take 0.1 mg by mouth every 6 (six) hours as needed. If BP is over 180 12/21/19  Yes [provider]  fenofibrate micronized (LOFIBRA) 200 MG capsule Take 200 mg by mouth daily before breakfast.   Yes [provider]  levothyroxine (SYNTHROID) 150 MCG tablet Take 1 tablet (150 mcg total) by mouth daily before breakfast. 09/23/19  Yes Carlus PavlovGherghe, Cristina, MD  losartan (COZAAR) 100 MG tablet Take 100 mg by mouth daily. 10/28/19  Yes [provider]  nitroGLYCERIN (NITROSTAT) 0.4 MG SL tablet Place 1 tablet (0.4 mg total) under the tongue every 5 (five) minutes as needed for chest pain. 11/22/19  Yes Turner, Cornelious Bryantraci R, MD  PARoxetine (PAXIL) 20 MG tablet Take 60 mg by mouth daily.    Yes [provider]  prednisoLONE acetate (PRED FORTE) 1 % ophthalmic suspension Place 1 drop into both eyes daily. Use as directed 01/18/19   [provider]    Allergies    Percocet [oxycodone-acetaminophen], Codeine, Other, Phenergan  [promethazine hcl], Clonidine derivatives, Hydralazine, and Metoprolol  Review of Systems   Review of Systems  Constitutional: Negative for activity change, chills and fever.  Respiratory: Positive for shortness of breath.   Cardiovascular: Positive for chest pain.  Gastrointestinal: Negative for abdominal pain.  Genitourinary: Negative for dysuria.  Musculoskeletal: Negative for back pain.  Skin: Negative for rash.  Allergic/Immunologic: Negative for immunocompromised state.  Neurological: Positive for dizziness and headaches. Negative for light-headedness.  Psychiatric/Behavioral: Negative for confusion.    Physical Exam Updated Vital Signs BP (!) 159/60   Pulse 65   Temp 98.1 F (36.7 C) (Oral)   Resp 19   Ht 5' (1.524 m)   Wt 78 kg   SpO2 97%   BMI 33.59 kg/m   Physical Exam Vitals and nursing note reviewed.  Constitutional:  General: She is not in acute distress.    Appearance: She is not ill-appearing, toxic-appearing or diaphoretic.     Comments: NAD  HENT:     Head: Normocephalic.     Right Ear: Tympanic membrane, ear canal and external ear normal.     Left Ear: Tympanic membrane, ear canal and external ear normal.     Nose: Nose normal.     Mouth/Throat:     Mouth: Mucous membranes are moist.  Eyes:     Extraocular Movements: Extraocular movements intact.     Conjunctiva/sclera: Conjunctivae normal.     Pupils: Pupils are equal, round, and reactive to light.  Cardiovascular:     Rate and Rhythm: Normal rate and regular rhythm.     Heart sounds: No murmur heard.  No friction rub. No gallop.      Comments: Peripheral pulses are 2+ and symmetric Pulmonary:     Effort: Pulmonary effort is normal. No respiratory distress.     Comments: Lungs are clear to auscultation bilaterally Abdominal:     General: There is no distension.     Palpations: Abdomen is soft. There is no mass.     Tenderness: There is no abdominal tenderness. There is no right CVA  tenderness, left CVA tenderness, guarding or rebound.     Hernia: No hernia is present.     Comments: Abdomen is obese, but nondistended  Musculoskeletal:        General: No tenderness.     Cervical back: Neck supple.     Comments: Trace pretibial edema bilaterally  Skin:    General: Skin is warm.     Findings: No rash.  Neurological:     Mental Status: She is alert.     Comments: Cranial nerves II through XII are grossly intact.  GCS 15.  Follows simple commands.  Speaks in complete, fluent sentences.  Finger-to-nose is intact bilaterally.  Sensation is intact and equal throughout.  5 out of 5 strength against resistance of the bilateral upper and lower extremities.  Gait is not ataxic.  Psychiatric:        Behavior: Behavior normal.     ED Results / Procedures / Treatments   Labs (all labs ordered are listed, but only abnormal results are displayed) Labs Reviewed  CBC - Abnormal; Notable for the following components:      Result Value   Hemoglobin 11.5 (*)    HCT 35.7 (*)    All other components within normal limits  BASIC METABOLIC PANEL - Abnormal; Notable for the following components:   Glucose, Bld 121 (*)    BUN 37 (*)    Creatinine, Ser 1.57 (*)    GFR calc non Af Amer 31 (*)    GFR calc Af Amer 36 (*)    All other components within normal limits  BRAIN NATRIURETIC PEPTIDE - Abnormal; Notable for the following components:   B Natriuretic Peptide 362.4 (*)    All other components within normal limits  TROPONIN I (HIGH SENSITIVITY)  TROPONIN I (HIGH SENSITIVITY)    EKG EKG Interpretation  Date/Time:  Saturday December 24 2019 00:52:37 EDT Ventricular Rate:  57 PR Interval:  182 QRS Duration: 94 QT Interval:  464 QTC Calculation: 451 R Axis:   24 Text Interpretation: Sinus bradycardia with Premature atrial complexes Cannot rule out Anterior infarct , age undetermined Abnormal ECG Confirmed by Tilden Fossa [phone]) on 12/24/2019 3:30:30 AM   Radiology DG  Chest 2 View  Result Date:  12/23/2019 CLINICAL DATA:  Shortness of breath EXAM: CHEST - 2 VIEW COMPARISON:  None. FINDINGS: The heart size and mediastinal contours are mildly enlarged. There is mild prominence of the central pulmonary vasculature. No large airspace consolidation or pleural effusion. No acute osseous abnormality. IMPRESSION: Mild cardiomegaly and pulmonary vascular congestion. Electronically Signed   By: Jonna Clark M.D.   On: 12/23/2019 19:08   CT Head Wo Contrast  Result Date: 12/24/2019 CLINICAL DATA:  Initial evaluation for acute dizziness. EXAM: CT HEAD WITHOUT CONTRAST TECHNIQUE: Contiguous axial images were obtained from the base of the skull through the vertex without intravenous contrast. COMPARISON:  Comparison made with prior CT from 05/10/2019. FINDINGS: Brain: Cerebral volume within normal limits for age. Focal parenchymal hypodensity involving the posterior left parietal cortex consistent with a chronic left MCA territory infarct, similar to previous. No acute intracranial hemorrhage. No acute large vessel territory infarct. No mass lesion, mass effect, or midline shift. No hydrocephalus or extra-axial fluid collection. Vascular: No hyperdense vessel. Scattered vascular calcifications noted within the carotid siphons. Skull: Scalp soft tissues and calvarium within normal limits. Sinuses/Orbits: Globes and orbital soft tissues normal. Small left maxillary sinus retention cyst. Paranasal sinuses are otherwise clear. No mastoid effusion. Other: None. IMPRESSION: 1. No acute intracranial abnormality. 2. Chronic left MCA territory infarct, similar to previous. Electronically Signed   By: Rise Mu M.D.   On: 12/24/2019 04:14    Procedures Procedures (including critical care time)  Medications Ordered in ED Medications  hydrALAZINE (APRESOLINE) injection 5 mg (5 mg Intravenous Given 12/24/19 0419)  metoCLOPramide (REGLAN) injection 10 mg (10 mg Intravenous Given  12/24/19 0457)  diphenhydrAMINE (BENADRYL) injection 12.5 mg (12.5 mg Intravenous Given 12/24/19 0457)  hydrALAZINE (APRESOLINE) injection 5 mg (5 mg Intravenous Given 12/24/19 0456)  cloNIDine (CATAPRES) tablet 0.1 mg (0.1 mg Oral Given 12/24/19 0615)  amLODipine (NORVASC) tablet 10 mg (10 mg Oral Given 12/24/19 0615)  losartan (COZAAR) tablet 100 mg (100 mg Oral Given 12/24/19 7673)    ED Course  I have reviewed the triage vital signs and the nursing notes.  Pertinent labs & imaging results that were available during my care of the patient were reviewed by me and considered in my medical decision making (see chart for details).    MDM Rules/Calculators/A&P                          79 year old female with a history of HTN, CKD stage IIIa, chronic diastolic heart failure, and OSA on CPAP who presents to the emergency department with a chief complaint of hypertension accompanied by bradycardia.  Patient with heart rate in the 50s on arrival to the ER and BP of 216/61.  Patient appears to have some confusion about her home medications.  Knows that she takes them all in the morning, but is unsure which medication she is currently taking.  Per chart review, this has been a concern previously during her cardiology visits.  The patient was seen and independently evaluated by Dr. Madilyn Hook, attending physician, who is in agreement with work-up and plan.   Chest x-ray with mild cardiomegaly and mild pulmonary vascular congestion on my evaluation.  However, on exam, she does not look volume overloaded.  BNP is 362.  EKG with sinus bradycardia and PACs.  Troponin is not elevated, which was obtained after patient had been in the ER for 9-10 hours.  Patient has no episodes of chest pain today.  Repeat troponin  is not indicated.  Creatinine is 1.5, not significantly elevated from her baseline of around 1.2.  Patient has been endorsing headache for most of the week.  CT head was obtained given concern for  hypertensive urgency or emergency was unremarkable.  Reglan given for headache, which has since resolved.  She has no focal neurologic deficits.  The remainder of her work-up has been reassuring.  Patient was treated with hydralazine IV x2 with some improvement in her pressure.  SBP to 190.  She was then given her home dose of antihypertensives, including losartan, clonidine, and and amlodipine with improvement of blood pressure to 159/60.  Heart rate has improved to the 60s.  Doubt hypertensive urgency or emergency.  Doubt ACS, acute on chronic CHF exacerbation, CVA, PE, aortic dissection.  At this time, the patient is hemodynamically stable in no acute distress.  Advised the patient to follow closely with cardiology given continued blood pressure.  ER return precautions given.  She is hemodynamically stable and in no acute distress.  Safe for discharge to home with outpatient follow-up as indicated.    Final Clinical Impression(s) / ED Diagnoses Final diagnoses:  Essential hypertension    Rx / DC Orders ED Discharge Orders    None       Barkley Boards, PA-C 12/24/19 2308    Tilden Fossa, MD 12/25/19 [phone]

## 2019-12-24 NOTE — ED Notes (Signed)
Discharge instructions discussed with pt. Pt verbalized understanding. Pt stable and ambulatory. No signature pad.  °

## 2019-12-26 NOTE — Telephone Encounter (Signed)
Added to appt notes for appt scheduled 01/12/2020 @3 :00 PM w/Dr 

## 2020-01-10 DIAGNOSIS — I10 Essential (primary) hypertension: Secondary | ICD-10-CM | POA: Diagnosis not present

## 2020-01-10 DIAGNOSIS — E119 Type 2 diabetes mellitus without complications: Secondary | ICD-10-CM | POA: Diagnosis not present

## 2020-01-10 DIAGNOSIS — E1169 Type 2 diabetes mellitus with other specified complication: Secondary | ICD-10-CM | POA: Diagnosis not present

## 2020-01-10 DIAGNOSIS — E039 Hypothyroidism, unspecified: Secondary | ICD-10-CM | POA: Diagnosis not present

## 2020-01-10 DIAGNOSIS — E785 Hyperlipidemia, unspecified: Secondary | ICD-10-CM | POA: Diagnosis not present

## 2020-01-10 DIAGNOSIS — E7800 Pure hypercholesterolemia, unspecified: Secondary | ICD-10-CM | POA: Diagnosis not present

## 2020-01-10 DIAGNOSIS — G4700 Insomnia, unspecified: Secondary | ICD-10-CM | POA: Diagnosis not present

## 2020-01-10 DIAGNOSIS — F339 Major depressive disorder, recurrent, unspecified: Secondary | ICD-10-CM | POA: Diagnosis not present

## 2020-01-12 ENCOUNTER — Ambulatory Visit: Payer: Medicare PPO | Admitting: Cardiology

## 2020-01-12 ENCOUNTER — Other Ambulatory Visit: Payer: Self-pay

## 2020-01-12 ENCOUNTER — Encounter: Payer: Self-pay | Admitting: Cardiology

## 2020-01-12 ENCOUNTER — Telehealth: Payer: Self-pay | Admitting: Internal Medicine

## 2020-01-12 VITALS — BP 209/78 | HR 71 | Ht 60.0 in | Wt 172.6 lb

## 2020-01-12 DIAGNOSIS — R001 Bradycardia, unspecified: Secondary | ICD-10-CM

## 2020-01-12 DIAGNOSIS — R0602 Shortness of breath: Secondary | ICD-10-CM

## 2020-01-12 DIAGNOSIS — I10 Essential (primary) hypertension: Secondary | ICD-10-CM

## 2020-01-12 DIAGNOSIS — I340 Nonrheumatic mitral (valve) insufficiency: Secondary | ICD-10-CM

## 2020-01-12 DIAGNOSIS — I5032 Chronic diastolic (congestive) heart failure: Secondary | ICD-10-CM | POA: Diagnosis not present

## 2020-01-12 MED ORDER — LEVOTHYROXINE SODIUM 150 MCG PO TABS: 150.0000 ug | ORAL_TABLET | Freq: Every day | ORAL | 3 refills | Status: DC

## 2020-01-12 MED ORDER — HYDRALAZINE HCL 50 MG PO TABS: 75.0000 mg | ORAL_TABLET | Freq: Three times a day (TID) | ORAL | 3 refills | Status: DC

## 2020-01-12 NOTE — Addendum Note (Signed)
Addended by: Theresia Majors on: 01/12/2020 03:30 PM   Modules accepted: Orders

## 2020-01-12 NOTE — Telephone Encounter (Signed)
[facility] called to request the following RX:  Medication Refill Request  Did you call your pharmacy and request this refill first? PHARM called  If patient has not contacted pharmacy first, instruct them to do so for future refills.   Remind them that contacting the pharmacy for their refill is the quickest method to get the refill.   Refill policy also stated that it will take anywhere between 24-72 hours to receive the refill.    Name of medication?  levothyroxine (SYNTHROID) 150 MCG tablet   Is this a 90 day supply? Yes  Name and location of pharmacy?  [facility] - [place], [place] - [address] Phone:  [phone]  Fax:  [phone]       Is the request for diabetes test strips? No  If yes, what brand? N/A

## 2020-01-12 NOTE — Patient Instructions (Addendum)
Medication Instructions:  Your physician has recommended you make the following change in your medication:  1) STOP taking meloxicam   2) INCREASE hydralazine to 75 mg three times per day  *If you need a refill on your cardiac medications before your next appointment, please call your pharmacy*  Testing/Procedures: Your physician has requested that you have an echocardiogram. Echocardiography is a painless test that uses sound waves to create images of your heart. It provides your doctor with information about the size and shape of your heart and how well your heart's chambers and valves are working. This procedure takes approximately one hour. There are no restrictions for this procedure.  Your physician has requested that you have a lexiscan myoview. For further information please visit [email or web address] Please follow instruction sheet, as given.  Follow-Up: At [facility], you and your health needs are our priority.  As part of our continuing mission to provide you with exceptional heart care, we have created designated Provider Care Teams.  These Care Teams include your primary Cardiologist (physician) and Advanced Practice Providers (APPs -  Physician Assistants and Nurse Practitioners) who all work together to provide you with the care you need, when you need it.  We recommend signing up for the patient portal called "MyChart".  Sign up information is provided on this After Visit Summary.  MyChart is used to connect with patients for Virtual Visits (Telemedicine).  Patients are able to view lab/test results, encounter notes, upcoming appointments, etc.  Non-urgent messages can be sent to your provider as well.   To learn more about what you can do with MyChart, go to [email or web address].    Your next appointment:   3 months  The format for your next appointment:   In Person  Provider:   Armanda Magic, MD   Other Instructions  Low-Sodium Eating Plan Sodium, which is an  element that makes up salt, helps you maintain a healthy balance of fluids in your body. Too much sodium can increase your blood pressure and cause fluid and waste to be held in your body. Your health care provider or dietitian may recommend following this plan if you have high blood pressure (hypertension), kidney disease, liver disease, or heart failure. Eating less sodium can help lower your blood pressure, reduce swelling, and protect your heart, liver, and kidneys. What are tips for following this plan? General guidelines  Most people on this plan should limit their sodium intake to 1,500-2,000 mg (milligrams) of sodium each day. Reading food labels   The Nutrition Facts label lists the amount of sodium in one serving of the food. If you eat more than one serving, you must multiply the listed amount of sodium by the number of servings.  Choose foods with less than 140 mg of sodium per serving.  Avoid foods with 300 mg of sodium or more per serving. Shopping  Look for lower-sodium products, often labeled as "low-sodium" or "no salt added."  Always check the sodium content even if foods are labeled as "unsalted" or "no salt added".  Buy fresh foods. ? Avoid canned foods and premade or frozen meals. ? Avoid canned, cured, or processed meats  Buy breads that have less than 80 mg of sodium per slice. Cooking  Eat more home-cooked food and less restaurant, buffet, and fast food.  Avoid adding salt when cooking. Use salt-free seasonings or herbs instead of table salt or sea salt. Check with your health care provider or pharmacist before using salt substitutes.  Adriana Simas  with plant-based oils, such as canola, sunflower, or olive oil. Meal planning  When eating at a restaurant, ask that your food be prepared with less salt or no salt, if possible.  Avoid foods that contain MSG (monosodium glutamate). MSG is sometimes added to [place] food, bouillon, and some canned foods. What foods are  recommended? The items listed may not be a complete list. Talk with your dietitian about what dietary choices are best for you. Grains Low-sodium cereals, including oats, puffed wheat and rice, and shredded wheat. Low-sodium crackers. Unsalted rice. Unsalted pasta. Low-sodium bread. Whole-grain breads and whole-grain pasta. Vegetables Fresh or frozen vegetables. "No salt added" canned vegetables. "No salt added" tomato sauce and paste. Low-sodium or reduced-sodium tomato and vegetable juice. Fruits Fresh, frozen, or canned fruit. Fruit juice. Meats and other protein foods Fresh or frozen (no salt added) meat, poultry, seafood, and fish. Low-sodium canned tuna and salmon. Unsalted nuts. Dried peas, beans, and lentils without added salt. Unsalted canned beans. Eggs. Unsalted nut butters. Dairy Milk. Soy milk. Cheese that is naturally low in sodium, such as ricotta cheese, fresh mozzarella, or Swiss cheese Low-sodium or reduced-sodium cheese. Cream cheese. Yogurt. Fats and oils Unsalted butter. Unsalted margarine with no trans fat. Vegetable oils such as canola or olive oils. Seasonings and other foods Fresh and dried herbs and spices. Salt-free seasonings. Low-sodium mustard and ketchup. Sodium-free salad dressing. Sodium-free light mayonnaise. Fresh or refrigerated horseradish. Lemon juice. Vinegar. Homemade, reduced-sodium, or low-sodium soups. Unsalted popcorn and pretzels. Low-salt or salt-free chips. What foods are not recommended? The items listed may not be a complete list. Talk with your dietitian about what dietary choices are best for you. Grains Instant hot cereals. Bread stuffing, pancake, and biscuit mixes. Croutons. Seasoned rice or pasta mixes. Noodle soup cups. Boxed or frozen macaroni and cheese. Regular salted crackers. Self-rising flour. Vegetables Sauerkraut, pickled vegetables, and relishes. Olives. [place] fries. Onion rings. Regular canned vegetables (not low-sodium or  reduced-sodium). Regular canned tomato sauce and paste (not low-sodium or reduced-sodium). Regular tomato and vegetable juice (not low-sodium or reduced-sodium). Frozen vegetables in sauces. Meats and other protein foods Meat or fish that is salted, canned, smoked, spiced, or pickled. Bacon, ham, sausage, hotdogs, corned beef, chipped beef, packaged lunch meats, salt pork, jerky, pickled herring, anchovies, regular canned tuna, sardines, salted nuts. Dairy Processed cheese and cheese spreads. Cheese curds. Blue cheese. Feta cheese. String cheese. Regular cottage cheese. Buttermilk. Canned milk. Fats and oils Salted butter. Regular margarine. Ghee. Bacon fat. Seasonings and other foods Onion salt, garlic salt, seasoned salt, table salt, and sea salt. Canned and packaged gravies. Worcestershire sauce. Tartar sauce. Barbecue sauce. Teriyaki sauce. Soy sauce, including reduced-sodium. Steak sauce. Fish sauce. Oyster sauce. Cocktail sauce. Horseradish that you find on the shelf. Regular ketchup and mustard. Meat flavorings and tenderizers. Bouillon cubes. Hot sauce and Tabasco sauce. Premade or packaged marinades. Premade or packaged taco seasonings. Relishes. Regular salad dressings. Salsa. Potato and tortilla chips. Corn chips and puffs. Salted popcorn and pretzels. Canned or dried soups. Pizza. Frozen entrees and pot pies. Summary  Eating less sodium can help lower your blood pressure, reduce swelling, and protect your heart, liver, and kidneys.  Most people on this plan should limit their sodium intake to 1,500-2,000 mg (milligrams) of sodium each day.  Canned, boxed, and frozen foods are high in sodium. Restaurant foods, fast foods, and pizza are also very high in sodium. You also get sodium by adding salt to food.  Try to cook at  home, eat more fresh fruits and vegetables, and eat less fast food, canned, processed, or prepared foods. This information is not intended to replace advice given to you  by your health care provider. Make sure you discuss any questions you have with your health care provider. Document Revised: 04/10/2017 Document Reviewed: 04/21/2016 Elsevier Patient Education  2020 [employer].

## 2020-01-12 NOTE — Telephone Encounter (Signed)
RX sent

## 2020-01-12 NOTE — Progress Notes (Addendum)
Cardiology Office Note:    Date:  01/12/2020   ID:  Kimberly [name], DOB 04-07-1941, MRN [id number]  PCP:  Clayborn Heron, MD  Cardiologist:  Armanda Magic, MD    Referring MD: Clayborn Heron, MD   Chief Complaint  Patient presents with  . Hypertension  . Congestive Heart Failure  . Mitral Regurgitation    History of Present Illness:    Kimberly [name] is a 79 y.o. female with a hx of chronic diastolic CHF, chronic lower extremity edema and hypertension.  She had a cath in 2010 due to CP showing normal coronary arteries with elevated LVEDP felt to be due to diastolic CHF vs. Microvascular angina. Her CP resolved for a while but reoccurred and nuclear stress test 09/2014 was normal.  2D echocardiogram 09/2014 showed normal LV function with EF 55 to 60% and diastolic dysfunction.  She was admitted February 2019 with junctional bradycardia, chest pain and shortness of breath.  Her heart rate was in the 40s.  Chest pressure was felt to be due to symptomatic junctional bradycardia.  Her clonidine and metoprolol were stopped.  2D echocardiogram at that time showed EF 55 to 60%.  Amlodipine was added for blood pressure control.  She was seen back in the office on 06/23/2017 her heart rate was 68 bpm.  Her weakness resolved.  She was trialed on hydralazine in the past but developed headache/dizziness after taking hydralazine so this was stopped. She was restarted on lower dose of amlodipine with titration of losartan to 50mg  daily. She has been on varying doses of amlodipine over the years as well as lisinopril, HCTZ, and chlorthalidone at one point. Amlodipine had to be reduced to 5mg  daily due to lower extremity edema. She had rise in creatinine with chlorthalidone 25mg  daily so this was discontinued.  She was seen 02/2019 by , PA for followup and had a lot of psychosocial issues.  Her BP was poorly controlled at 190/52mmHg and her Losartan was increased to 100mg  daily.  Amlodipine was  not titrated due to problems with LE edema in the past.  It was felt that uncontrolled depression and anxiety could also be playing a role in her BP elevations.  She was also felt to have depression and was referred back to her PCP to address her depression and antihypertensive meds so there were not multiple MDs making changes to her meds and causing confusion.   She has been seen multiple times by 03/2019, PA since I saw her last and was still having issues with BP and compliance.  It was felt she would benefit significantly from enrollment in a home-based primary care program to not only address the issue of transportation but also provide social support, medication reconciliation, access to home labwork, and close management of blood pressure. She was referedl to [facility] for this.  Her Losartan was increased to 100mg  daily due to poorly controlled HTN.    She is here today for followup.  Since I saw her last her BP meds have yet again been changed.  Her Losartan is now back down to 50mg  daily and Hydralazine has been started at 50mg  TID.  She also has been started on Meloxicam.  She was recently seen in the ER for hypertensive urgency and given IV hydralazine.  She complained of some SOB and chest tightness. hsTrop was normal and it was felt to be related to her HTN.   She has had SOB and occasionally her  heart will feel heavy and will beat fast.  She denies any  PND, orthopnea, LE edema, dizziness or syncope. She is compliant with her meds and is tolerating meds with no SE.    Past Medical History:  Diagnosis Date  . Acid reflux   . Anxiety   . Arthritis   . Back spasm   . Bradycardia    a. junctional and sinus brady during admission 06/2017 requiring cessation of beta blocker and clonidine.  . Chronic diastolic heart failure (HCC) 03/20/2015  . Chronic edema   . CKD (chronic kidney disease), stage III   . Depression   . Diabetes mellitus without complication (HCC)    diet controled   . Fuch's endothelial dystrophy    bilateral eyes  . Headache    occ  . HOH (hard of hearing)    wears hearing aid in left ear  . Hypercholesteremia   . Hypertensive heart disease with CHF (HCC) 09/11/2014  . Insomnia   . Junctional bradycardia   . Mild mitral regurgitation   . Mild tricuspid regurgitation   . Normal coronary arteries 2010   a. 2010 cath with no obstructive epicardial disease.  Done for CP which was felt to be due to microvascular angina or diastolic HF.  . OSA on CPAP    Followed by Dr. Fannie Knee  . Patient's other noncompliance with medication regimen    per notes, gets confused easily with medications, difficult compliance     Past Surgical History:  Procedure Laterality Date  . ABDOMINAL HYSTERECTOMY    . APPENDECTOMY    . CARDIAC CATHETERIZATION     no CAD  . CESAREAN SECTION     x 2  . CHOLECYSTECTOMY    . COLONOSCOPY    . EYE SURGERY Right    implant  . ROTATOR CUFF REPAIR Left   . SHOULDER ARTHROSCOPY WITH SUBACROMIAL DECOMPRESSION AND BICEP TENDON REPAIR Left 08/24/2012   Procedure: SHOULDER ARTHROSCOPY WITH SUBACROMIAL DECOMPRESSION AND BICEP TENDON REPAIR;  Surgeon: Cammy Copa, MD;  Location: [facility] OR;  Service: Orthopedics;  Laterality: Left;  Left Shoulder Arthroscopy, Debridement, Biceps Tenotomy  . THYROIDECTOMY N/A 06/12/2015   Procedure: TOTAL THYROIDECTOMY;  Surgeon: Darnell Level, MD;  Location: [facility] OR;  Service: General;  Laterality: N/A;    Current Medications: Current Meds  Medication Sig  . amLODipine (NORVASC) 10 MG tablet Take 5 mg by mouth daily.   Marland Kitchen aspirin 81 MG chewable tablet Chew 81 mg by mouth daily.  . Cholecalciferol (VITAMIN D3) 5000 units CAPS Take 5,000 Units by mouth daily.  . cloNIDine (CATAPRES) 0.1 MG tablet Take 0.1 mg by mouth every 6 (six) hours as needed. If BP is over 180  . fenofibrate micronized (LOFIBRA) 200 MG capsule Take 200 mg by mouth daily before breakfast.  . hydrALAZINE (APRESOLINE) 50 MG tablet  Take 50 mg by mouth 3 (three) times daily.   Marland Kitchen levothyroxine (SYNTHROID) 150 MCG tablet Take 1 tablet (150 mcg total) by mouth daily before breakfast.  . losartan (COZAAR) 100 MG tablet Take 50 mg by mouth daily.   . nitroGLYCERIN (NITROSTAT) 0.4 MG SL tablet Place 1 tablet (0.4 mg total) under the tongue every 5 (five) minutes as needed for chest pain.  Marland Kitchen PARoxetine (PAXIL) 20 MG tablet Take 60 mg by mouth daily.      Allergies:   Percocet [oxycodone-acetaminophen], Codeine, Other, Phenergan [promethazine hcl], Clonidine derivatives, Hydralazine, and Metoprolol   Social History   Socioeconomic History  .  Marital status: Married    Spouse name: Not on file  . Number of children: 2  . Years of education: Not on file  . Highest education level: Not on file  Occupational History  . Occupation: Radiographer, therapeuticretired-assistant teacher  Tobacco Use  . Smoking status: Never Smoker  . Smokeless tobacco: Never Used  Vaping Use  . Vaping Use: Never used  Substance and Sexual Activity  . Alcohol use: No    Alcohol/week: 0.0 standard drinks  . Drug use: No  . Sexual activity: Not on file  Other Topics Concern  . Not on file  Social History Narrative  . Not on file   Social Determinants of Health   Financial Resource Strain:   . Difficulty of Paying Living Expenses: Not on file  Food Insecurity:   . Worried About Programme researcher, broadcasting/film/videounning Out of Food in the Last Year: Not on file  . Ran Out of Food in the Last Year: Not on file  Transportation Needs:   . Lack of Transportation (Medical): Not on file  . Lack of Transportation (Non-Medical): Not on file  Physical Activity:   . Days of Exercise per Week: Not on file  . Minutes of Exercise per Session: Not on file  Stress:   . Feeling of Stress : Not on file  Social Connections:   . Frequency of Communication with Friends and Family: Not on file  . Frequency of Social Gatherings with Friends and Family: Not on file  . Attends Religious Services: Not on file  .  Active Member of Clubs or Organizations: Not on file  . Attends BankerClub or Organization Meetings: Not on file  . Marital Status: Not on file     Family History: The patient's family history includes Cancer in her sister; Stomach cancer in her mother.  ROS:   Please see the history of present illness.    ROS  All other systems reviewed and negative.   EKGs/Labs/Other Studies Reviewed:    The following studies were reviewed today: Outside labs from PCP  EKG:  EKG is not ordered today.    Recent Labs: 05/10/2019: ALT 16 09/22/2019: TSH 3.96 12/23/2019: B Natriuretic Peptide 362.4; BUN 37; Creatinine, Ser 1.57; Hemoglobin 11.5; Platelets 221; Potassium 3.8; Sodium 139   Recent Lipid Panel    Component Value Date/Time   CHOL 152 10/02/2015 0901   TRIG 169 (H) 10/02/2015 0901   HDL 30 (L) 10/02/2015 0901   CHOLHDL 5.1 (H) 10/02/2015 0901   VLDL 34 (H) 10/02/2015 0901   LDLCALC 88 10/02/2015 0901    Physical Exam:    VS:  BP (!) 209/78   Pulse 71   Ht 5' (1.524 m)   Wt 172 lb 9.6 oz (78.3 kg)   SpO2 95%   BMI 33.71 kg/m     Wt Readings from Last 3 Encounters:  01/12/20 172 lb 9.6 oz (78.3 kg)  12/23/19 172 lb (78 kg)  10/12/19 172 lb 1.9 oz (78.1 kg)     GEN: Well nourished, well developed in no acute distress HEENT: Normal NECK: No JVD; No carotid bruits LYMPHATICS: No lymphadenopathy CARDIAC:RRR, no murmurs, rubs, gallops RESPIRATORY:  Clear to auscultation without rales, wheezing or rhonchi  ABDOMEN: Soft, non-tender, non-distended MUSCULOSKELETAL:  No edema; No deformity  SKIN: Warm and dry NEUROLOGIC:  Alert and oriented x 3 PSYCHIATRIC:  Normal affect    ASSESSMENT:    1. Essential hypertension   2. Chronic diastolic heart failure (HCC)   3. Junctional bradycardia  4. Mild mitral regurgitation    PLAN:    In order of problems listed above:  1.  HTN -BP is poorly controlled -her meds continue to get changed each time she presents back to me and  she really does not have insight into her meds -her Losartan is now back down to 50mg  daily but she does not know why -she is now on hydralazine at 50mg  TID and I will increase it to 75mg  TID -continue on current dose of Losartan and Amlodipine 5mg  daily -she was taken off BB due to bradycardia -will have her followup with PharmD on Tuesday and increase Hydralazine further if needed -if we cannot get her BP controlled will need to send to [facility] -she still admits to using table salt and I cautioned her on this and told her to take all Na out of her house  2.  Chronic diastolic CHF -she does not appear volume overloaded on exam today -currently she is not taking diuretic therapy  3.  Junctional Bradycardia -denies any dizziness or syncope -no further episodes off BB  4.  Mitral regurgitation -no murmur on exam  5.  SOB/Chest pain -suspect this is driven by her poorly controlled HTN -will check a 2D echo to assess LVF and Lexiscan myoview to rule out ischemia -she does not appear volume overloaded on exam   Medication Adjustments/Labs and Tests Ordered: Current medicines are reviewed at length with the patient today.  Concerns regarding medicines are outlined above.  Orders Placed This Encounter  Procedures  . EKG 12-Lead   No orders of the defined types were placed in this encounter.   Signed, , MD  01/12/2020 3:11 PM    [facility]

## 2020-01-17 ENCOUNTER — Ambulatory Visit: Payer: Medicare PPO

## 2020-01-23 ENCOUNTER — Telehealth: Payer: Self-pay | Admitting: *Deleted

## 2020-01-23 NOTE — Telephone Encounter (Signed)
   [facility] Pre-operative Risk Assessment    HEARTCARE STAFF: - Please ensure there is not already an duplicate clearance open for this procedure. - Under Visit Info/Reason for Call, type in Other and utilize the format Clearance MM/DD/YY or Clearance TBD. Do not use dashes or single digits. - If request is for dental extraction, please clarify the # of teeth to be extracted.  Request for surgical clearance:  1. What type of surgery is being performed?  LAPAROSCOPIC LOA UHR WITH MESH   2. When is this surgery scheduled?  TBD   3. What type of clearance is required (medical clearance vs. Pharmacy clearance to hold med vs. Both)?  BOTH  4. Are there any medications that need to be held prior to surgery and how long? ASPIRIN   5. Practice name and name of physician performing surgery?   [facility] / DR. Rosendo Gros  6. What is the office phone number?  [phone]   7.   What is the office fax number?  [phone]  8.   Anesthesia type (None, local, MAC, general) ?  GENERAL    Kimberly [name] 01/23/2020, 1:25 PM  _________________________________________________________________   (provider comments below)

## 2020-01-24 ENCOUNTER — Ambulatory Visit: Payer: Medicare PPO

## 2020-01-31 NOTE — Telephone Encounter (Signed)
S/w pt and explained to her per pre op team she will need an appt in 2-3 weeks to f/u on her BP before she can be cleared for her procedure. Pt has been scheduled to see [name] 02/29/20 @ 3:45. Pt only could do after 3 pm. Pt also states she is not sure if she is going to be able to keep her testing scheduled for 02/02/20 for pre op as she is having a friend bring her. I encouraged the pt to do her very best to keep her appts for echo and myoview as they are needed for her pre op assessment. Pre op team stated appt with [name] or [name], [facility] though neither had any available appts. I have scheduled the pt with [name] 02/29/20 @ 3:45. Pt states she will call the office if she is not going to be able to keep her appts.

## 2020-01-31 NOTE — Telephone Encounter (Signed)
   Primary Cardiologist: Armanda Magic, MD  Chart reviewed as part of pre-operative protocol coverage. Because of Kimberly [name]'s past medical history and time since last visit, they will require a follow-up visit in order to better assess preoperative cardiovascular risk.  I reviewed her chart.  She has a Myoview and echo scheduled for 9/23.  She has had issues with labile B/P.  She would be best served with an office visit to review her tests and make sure her B/P is controlled.  Pre-op covering staff: - Please schedule appointment with [name] or Dr Mayford Knife in the next 2 -3 weeks and call patient to inform them. If patient already had an upcoming appointment within acceptable timeframe, please add "pre-op clearance" to the appointment notes so provider is aware. - Please contact requesting surgeon's office via preferred method (i.e, phone, fax) to inform them of need for appointment prior to surgery.  If applicable, this message will also be routed to pharmacy pool and/or primary cardiologist for input on holding anticoagulant/antiplatelet agent as requested below so that this information is available to the clearing provider at time of patient's appointment.   Corine Shelter, PA-C  01/31/2020, 11:52 AM

## 2020-01-31 NOTE — Telephone Encounter (Signed)
Will forward to [facility] for upcoming appt 02/29/20 for pre op assessment. Will remove from the pre op call back pool.

## 2020-02-01 ENCOUNTER — Telehealth (HOSPITAL_COMMUNITY): Payer: Self-pay | Admitting: *Deleted

## 2020-02-01 NOTE — Telephone Encounter (Signed)
Left message on voicemail per DPR in reference to upcoming appointment scheduled on 02/02/20 at 10:15 with detailed instructions given per Myocardial Perfusion Study Information Sheet for the test. LM to arrive 15 minutes early, and that it is imperative to arrive on time for appointment to keep from having the test rescheduled. If you need to cancel or reschedule your appointment, please call the office within 24 hours of your appointment. Failure to do so may result in a cancellation of your appointment, and a $50 no show fee. Phone number given for call back for any questions.

## 2020-02-02 ENCOUNTER — Other Ambulatory Visit: Payer: Self-pay

## 2020-02-02 ENCOUNTER — Ambulatory Visit (HOSPITAL_BASED_OUTPATIENT_CLINIC_OR_DEPARTMENT_OTHER): Payer: Medicare PPO

## 2020-02-02 ENCOUNTER — Ambulatory Visit (HOSPITAL_COMMUNITY): Payer: Medicare PPO | Attending: Internal Medicine

## 2020-02-02 DIAGNOSIS — R0602 Shortness of breath: Secondary | ICD-10-CM

## 2020-02-02 LAB — ECHOCARDIOGRAM COMPLETE
Area-P 1/2: 3.58 cm2
MV M vel: 4.87 m/s
MV Peak grad: 94.9 mmHg
Radius: 0.6 cm
S' Lateral: 2.8 cm

## 2020-02-02 LAB — MYOCARDIAL PERFUSION IMAGING
LV dias vol: 94 mL (ref 46–106)
LV sys vol: 33 mL
Peak HR: 75 {beats}/min
Rest HR: 58 {beats}/min
SDS: 1
SRS: 0
SSS: 1
TID: 0.99

## 2020-02-02 MED ORDER — TECHNETIUM TC 99M TETROFOSMIN IV KIT
32.6000 | PACK | Freq: Once | INTRAVENOUS | Status: AC | PRN
  Administered 2020-02-02: 32.6 via INTRAVENOUS
  Filled 2020-02-02: qty 33

## 2020-02-02 MED ORDER — REGADENOSON 0.4 MG/5ML IV SOLN
0.4000 mg | Freq: Once | INTRAVENOUS | Status: AC
  Administered 2020-02-02: 0.4 mg via INTRAVENOUS

## 2020-02-02 MED ORDER — TECHNETIUM TC 99M TETROFOSMIN IV KIT
9.8000 | PACK | Freq: Once | INTRAVENOUS | Status: AC | PRN
  Administered 2020-02-02: 9.8 via INTRAVENOUS
  Filled 2020-02-02: qty 10

## 2020-02-06 ENCOUNTER — Telehealth: Payer: Self-pay

## 2020-02-06 DIAGNOSIS — R072 Precordial pain: Secondary | ICD-10-CM

## 2020-02-06 DIAGNOSIS — I110 Hypertensive heart disease with heart failure: Secondary | ICD-10-CM

## 2020-02-06 DIAGNOSIS — I272 Pulmonary hypertension, unspecified: Secondary | ICD-10-CM

## 2020-02-06 NOTE — Telephone Encounter (Signed)
The patient has been notified of the result and verbalized understanding.  All questions (if any) were answered. Theresia Majors, RN 02/06/2020 2:38 PM  Patient states that she is unable to have any further testing done until after the end of October. She states that she is busy taking care of her husband and does not have many people that can be there to watch him while she goes to appointments. Patient also cancelled pre-op appointment with [name] 10/20. I advised patient on importance of following up and having tests done. Patient states that when she is able to she will call us.

## 2020-02-06 NOTE — Telephone Encounter (Signed)
-----   Message from Quintella Reichert, MD sent at 02/05/2020  9:37 PM EDT ----- 2D echo showed normal heart function with increased stiffness of heart muscle and increased pressure in LV.  Moderately elevated pressure in arteries of lungs called pulmonary HTN, moderately enlarged LA and RA . Please order PFTs with DLCO, VQ scan and PSG all for dx of pulmonary HTN.  Suspect that pulmonary HTN is likely related to chronic diastolic CHF.  Please have her come in for a BNP

## 2020-02-07 ENCOUNTER — Other Ambulatory Visit: Payer: Self-pay

## 2020-02-07 DIAGNOSIS — R072 Precordial pain: Secondary | ICD-10-CM

## 2020-02-07 NOTE — Progress Notes (Signed)
xr

## 2020-02-09 DIAGNOSIS — E119 Type 2 diabetes mellitus without complications: Secondary | ICD-10-CM | POA: Diagnosis not present

## 2020-02-09 DIAGNOSIS — F339 Major depressive disorder, recurrent, unspecified: Secondary | ICD-10-CM | POA: Diagnosis not present

## 2020-02-09 DIAGNOSIS — E039 Hypothyroidism, unspecified: Secondary | ICD-10-CM | POA: Diagnosis not present

## 2020-02-09 DIAGNOSIS — G4700 Insomnia, unspecified: Secondary | ICD-10-CM | POA: Diagnosis not present

## 2020-02-09 DIAGNOSIS — E1169 Type 2 diabetes mellitus with other specified complication: Secondary | ICD-10-CM | POA: Diagnosis not present

## 2020-02-09 DIAGNOSIS — E7800 Pure hypercholesterolemia, unspecified: Secondary | ICD-10-CM | POA: Diagnosis not present

## 2020-02-09 DIAGNOSIS — G4733 Obstructive sleep apnea (adult) (pediatric): Secondary | ICD-10-CM | POA: Diagnosis not present

## 2020-02-09 DIAGNOSIS — I10 Essential (primary) hypertension: Secondary | ICD-10-CM | POA: Diagnosis not present

## 2020-02-09 DIAGNOSIS — E785 Hyperlipidemia, unspecified: Secondary | ICD-10-CM | POA: Diagnosis not present

## 2020-02-21 DIAGNOSIS — I119 Hypertensive heart disease without heart failure: Secondary | ICD-10-CM | POA: Diagnosis not present

## 2020-02-21 DIAGNOSIS — I5032 Chronic diastolic (congestive) heart failure: Secondary | ICD-10-CM | POA: Diagnosis not present

## 2020-02-21 DIAGNOSIS — N390 Urinary tract infection, site not specified: Secondary | ICD-10-CM | POA: Diagnosis not present

## 2020-02-21 DIAGNOSIS — I10 Essential (primary) hypertension: Secondary | ICD-10-CM | POA: Diagnosis not present

## 2020-02-21 DIAGNOSIS — N1831 Chronic kidney disease, stage 3a: Secondary | ICD-10-CM | POA: Diagnosis not present

## 2020-02-29 ENCOUNTER — Ambulatory Visit: Payer: Medicare PPO | Admitting: Cardiology

## 2020-02-29 DIAGNOSIS — G4733 Obstructive sleep apnea (adult) (pediatric): Secondary | ICD-10-CM | POA: Diagnosis not present

## 2020-03-08 DIAGNOSIS — G4700 Insomnia, unspecified: Secondary | ICD-10-CM | POA: Diagnosis not present

## 2020-03-08 DIAGNOSIS — E7800 Pure hypercholesterolemia, unspecified: Secondary | ICD-10-CM | POA: Diagnosis not present

## 2020-03-08 DIAGNOSIS — I10 Essential (primary) hypertension: Secondary | ICD-10-CM | POA: Diagnosis not present

## 2020-03-08 DIAGNOSIS — F339 Major depressive disorder, recurrent, unspecified: Secondary | ICD-10-CM | POA: Diagnosis not present

## 2020-03-08 DIAGNOSIS — E039 Hypothyroidism, unspecified: Secondary | ICD-10-CM | POA: Diagnosis not present

## 2020-03-08 DIAGNOSIS — E785 Hyperlipidemia, unspecified: Secondary | ICD-10-CM | POA: Diagnosis not present

## 2020-03-08 DIAGNOSIS — E119 Type 2 diabetes mellitus without complications: Secondary | ICD-10-CM | POA: Diagnosis not present

## 2020-03-29 ENCOUNTER — Other Ambulatory Visit: Payer: Self-pay

## 2020-03-29 ENCOUNTER — Encounter: Payer: Self-pay | Admitting: Internal Medicine

## 2020-03-29 ENCOUNTER — Ambulatory Visit: Payer: Medicare PPO | Admitting: Internal Medicine

## 2020-03-29 VITALS — BP 132/78 | HR 67 | Ht 60.0 in | Wt 165.8 lb

## 2020-03-29 DIAGNOSIS — E559 Vitamin D deficiency, unspecified: Secondary | ICD-10-CM

## 2020-03-29 DIAGNOSIS — E890 Postprocedural hypothyroidism: Secondary | ICD-10-CM | POA: Diagnosis not present

## 2020-03-29 DIAGNOSIS — Z23 Encounter for immunization: Secondary | ICD-10-CM | POA: Diagnosis not present

## 2020-03-29 LAB — TSH: TSH: 1.21 u[IU]/mL (ref 0.35–4.50)

## 2020-03-29 LAB — T4, FREE: Free T4: 1.53 ng/dL (ref 0.60–1.60)

## 2020-03-29 NOTE — Progress Notes (Signed)
Patient ID: Kimberly [name], female   DOB: 03/16/1941, 79 y.o.   MRN: [id number]  This visit occurred during the SARS-CoV-2 public health emergency.  Safety protocols were in place, including screening questions prior to the visit, additional usage of staff PPE, and extensive cleaning of exam room while observing appropriate contact time as indicated for disinfecting solutions.   HPI  Kimberly [name] is a 79 y.o.-year-old female, returning for f/u for hypercalcemia, dx 2012, postsurgical hypothyroidism. Last visit was 6 months ago.  She is limited in coming for the appointments by the need for transportation.  Before last visit she was admitted in 07/2019 with encephalopathy either related to UTI or hypertensive.  She mentioned that this episode was related to increased stress and also being the caretaker for her husband who is very sick. He now developed dementia and she is exhausted taking care of him. Daughter and granddaughter are helping.  Postsurgical hypothyroidism: - She had thyroidectomy for substernal goiter in 05/2015: Benign nodule pathology  Reviewed history: She had problems with compliance with her levothyroxine in the past.  In 06/2017 he was hospitalized and was found to have a junctional bradycardia with a TSH of 42.  At that time, daughter reported that she was not taking the levothyroxine consistently.  Afterwards, she started to put the medication bottle on her nightstand and was consistently taking it daily.  However, she then had a TSH level that was high, at 15.  TSH remains high so we increased the dose to 150 mcg daily in 07/2019.  She was supposed to be on levothyroxine 150 mcg daily (increased 07/2019), but [facility] gave her 175 mcg per review of her pill pack...  She takes this: - in am - fasting - at least 30 min from b'fast - no calcium  - no iron - no multivitamins - no PPIs - not on Biotin  Reviewed her TFTs: Lab Results  Component Value Date   TSH  3.96 09/22/2019   TSH 5.89 (H) 07/20/2019   TSH 6.11 (H) 04/13/2019   TSH 15.700 (H) 02/21/2019   TSH 3.93 06/14/2018   TSH 1.74 09/24/2017   TSH 16.43 (H) 06/25/2017   TSH 42.727 (H) 06/12/2017   TSH 41.60 (H) 01/16/2016   TSH 31.80 (H) 09/18/2015   FREET4 1.25 09/22/2019   FREET4 1.18 07/20/2019   FREET4 1.23 04/13/2019   FREET4 0.98 06/14/2018   FREET4 1.19 09/24/2017   FREET4 1.06 06/25/2017   FREET4 0.84 01/16/2016   FREET4 0.83 09/18/2015  07/31/2015: TSH 58  Surprisingly, her calcium has improved after parathyroid surgery in 2016  Primary hyperparathyroidism: -Greatly improved from 2016  Reviewed recent labs: Lab Results  Component Value Date   PTH 19 09/18/2015   PTH Comment 09/18/2015   PTH 21 02/08/2015   PTH Comment 02/08/2015   PTH 17 12/09/2013   PTH Comment 12/09/2013   PTH 14.9 08/24/2012   CALCIUM 9.9 12/23/2019   CALCIUM 10.0 10/20/2019   CALCIUM 9.7 10/12/2019   CALCIUM 9.2 05/11/2019   CALCIUM 9.9 05/10/2019   CALCIUM 10.0 02/21/2019   CALCIUM 10.2 07/05/2018   CALCIUM 9.9 06/16/2018   CALCIUM 10.2 06/14/2018   CALCIUM 9.7 06/13/2017  12/25/2014: Calcium 10.8 (8.6-10.3) 09/12/2013: Ca 10.8, iCa 5.7 (4.5-5.6)  Has a history of vitamin D deficiency: Lab Results  Component Value Date   VD25OH 34.49 07/20/2019   VD25OH 18.27 (L) 04/13/2019   VD25OH 57.47 06/14/2018   VD25OH 61.83 09/24/2017   VD25OH 32.67  09/18/2015   VD25OH 28.65 (L) 02/08/2015   VD25OH 50.59 12/09/2013   She continues on vitamin D 5000 units daily  - she thinks...  Reviewed other pertinent labs: Component     Latest Ref Rng 02/08/2015 02/08/2015         2:14 PM  2:14 PM  Vitamin A (Retinoic Acid)     38 - 98 mcg/dL 89    Component     Latest Ref Rng 12/09/2013  Vitamin D 1, 25 (OH) Total     18 - 72 pg/mL 79 (H)  Vitamin D3 1, 25 (OH)      79  Vitamin D2 1, 25 (OH)      <8  PTH-Related Protein (PTH-RP)     14 - 27 pg/mL 16  Phosphorus     2.3 - 4.6 mg/dL  3.2   Her urine calcium was elevated: Component     Latest Ref Rng 01/20/2014  Calcium, Ur      14  Calcium, 24 hour urine     100 - 250 mg/day 322 (H)  Creatinine, Urine      51.9  Creatinine, 24H Ur     700 - 1800 mg/day 1194   No history of kidney stones. No history of osteoporosis.  No fractures.  Reviewed her DXA scan report from 2008:  [place] (L1-L3)  BMD: 1.273  T-score (% of young adult value): 2.3  Z-score (% adult age match value): 4.1  LEFT HIP (NECK)  BMD: 0.831  T-score (% of young adult value): -0.2  Z-score (% adult age match value): 1.4  Assessment: This patient is considered normal according to [facility]) criteria.   She has CKD (Dr. Moshe Cipro).  Latest BUN/creatinine reviewed: Lab Results  Component Value Date   BUN 37 (H) 12/23/2019   CREATININE 1.57 (H) 12/23/2019    She has had a technetium sestamibi scan (04/2015) that was negative for a parathyroid adenoma e and a CT scan of her neck (04/2015) there was also negative for possible parathyroid nodule nodule but this showed a substernal goiter with tracheal narrowing.  She also has a history of DM2 (managed by PCP), HTN, HL.  ROS: Constitutional: + weight gain/+ weight loss, + fatigue, no subjective hyperthermia, no subjective hypothermia Eyes: no blurry vision, no xerophthalmia ENT: no sore throat, no nodules palpated in neck, no dysphagia, no odynophagia, no hoarseness Cardiovascular: no CP/no SOB/no palpitations/+ leg swelling Respiratory: no cough/no SOB/no wheezing Gastrointestinal: no N/no V/no D/no C/no acid reflux Musculoskeletal: no muscle aches/no joint aches Skin: no rashes, no hair loss Neurological: no tremors/no numbness/no tingling/no dizziness  I reviewed pt's medications, allergies, PMH, social hx, family hx, and changes were documented in the history of present illness. Otherwise, unchanged from my initial visit note.  Past Medical History:   Diagnosis Date  . Acid reflux   . Anxiety   . Arthritis   . Back spasm   . Bradycardia    a. junctional and sinus brady during admission 06/2017 requiring cessation of beta blocker and clonidine.  . Chronic diastolic heart failure ([place]) 03/20/2015  . Chronic edema   . CKD (chronic kidney disease), stage III   . Depression   . Diabetes mellitus without complication ([place])    diet controled  . Fuch's endothelial dystrophy    bilateral eyes  . Headache    occ  . HOH (hard of hearing)    wears hearing aid in left ear  . Hypercholesteremia   .  Hypertensive heart disease with CHF ([place]) 09/11/2014  . Insomnia   . Junctional bradycardia   . Mild mitral regurgitation   . Mild tricuspid regurgitation   . Normal coronary arteries 2010   a. 2010 cath with no obstructive epicardial disease.  Done for CP which was felt to be due to microvascular angina or diastolic HF.  . OSA on CPAP    Followed by Dr. Keturah Barre  . Patient's other noncompliance with medication regimen    per notes, gets confused easily with medications, difficult compliance    Past Surgical History:  Procedure Laterality Date  . ABDOMINAL HYSTERECTOMY    . APPENDECTOMY    . CARDIAC CATHETERIZATION     no CAD  . CESAREAN SECTION     x 2  . CHOLECYSTECTOMY    . COLONOSCOPY    . EYE SURGERY Right    implant  . ROTATOR CUFF REPAIR Left   . SHOULDER ARTHROSCOPY WITH SUBACROMIAL DECOMPRESSION AND BICEP TENDON REPAIR Left 08/24/2012   Procedure: SHOULDER ARTHROSCOPY WITH SUBACROMIAL DECOMPRESSION AND BICEP TENDON REPAIR;  Surgeon: Meredith Pel, MD;  Location: [facility];  Service: Orthopedics;  Laterality: Left;  Left Shoulder Arthroscopy, Debridement, Biceps Tenotomy  . THYROIDECTOMY N/A 06/12/2015   Procedure: TOTAL THYROIDECTOMY;  Surgeon: Armandina Gemma, MD;  Location: [facility];  Service: General;  Laterality: N/A;   History   Social History  . Marital Status: Married    Spouse Name: N/A    Number of Children: 2    Occupational History  . retired-assistant teacher    Social History Main Topics  . Smoking status: Never Smoker   . Smokeless tobacco: Not on file  . Alcohol Use: No  . Drug Use: No   Current Outpatient Medications on File Prior to Visit  Medication Sig Dispense Refill  . amLODipine (NORVASC) 10 MG tablet Take 5 mg by mouth daily.     Marland Kitchen aspirin 81 MG chewable tablet Chew 81 mg by mouth daily.    . Cholecalciferol (VITAMIN D3) 5000 units CAPS Take 5,000 Units by mouth daily.    . cloNIDine (CATAPRES) 0.1 MG tablet Take 0.1 mg by mouth every 6 (six) hours as needed. If BP is over 180    . fenofibrate micronized (LOFIBRA) 200 MG capsule Take 200 mg by mouth daily before breakfast.    . hydrALAZINE (APRESOLINE) 50 MG tablet Take 1.5 tablets (75 mg total) by mouth 3 (three) times daily. 410 tablet 3  . levothyroxine (SYNTHROID) 150 MCG tablet Take 1 tablet (150 mcg total) by mouth daily before breakfast. 90 tablet 3  . losartan (COZAAR) 100 MG tablet Take 50 mg by mouth daily.     . nitroGLYCERIN (NITROSTAT) 0.4 MG SL tablet Place 1 tablet (0.4 mg total) under the tongue every 5 (five) minutes as needed for chest pain. 25 tablet 6  . PARoxetine (PAXIL) 20 MG tablet Take 60 mg by mouth daily.      No current facility-administered medications on file prior to visit.   Allergies  Allergen Reactions  . Percocet [Oxycodone-Acetaminophen] Shortness Of Breath and Other (See Comments)    Pt couldn't breathe or catch her breath  . Codeine Other (See Comments)    hyperactivity  . Other Other (See Comments)    Some [facility] caused hallucinations   . Phenergan [Promethazine Hcl] Other (See Comments)    Hallucinations   . Clonidine Derivatives     Bradycardia (06/2017)  .  Hydralazine     Headache/dizzy  . Metoprolol     Bradycardia 06/2017    Family History  Problem Relation Age of Onset  . Stomach cancer Mother   . Cancer Sister         intestinal   PE: BP 132/78   Pulse 67   Ht 5' (1.524 m)   Wt 165 lb 12.8 oz (75.2 kg)   SpO2 97%   BMI 32.38 kg/m   Wt Readings from Last 3 Encounters:  03/29/20 165 lb 12.8 oz (75.2 kg)  01/12/20 172 lb 9.6 oz (78.3 kg)  12/23/19 172 lb (78 kg)   Constitutional: overweight, in NAD Eyes: PERRLA, EOMI, no exophthalmos ENT: moist mucous membranes, no thyromegaly, no cervical lymphadenopathy Cardiovascular: RRR, No MRG, + B LE pitting edema Respiratory: CTA B Gastrointestinal: abdomen soft, NT, ND, BS+ Musculoskeletal: no deformities, strength intact in all 4 Skin: moist, warm, no rashes Neurological: no tremor with outstretched hands, DTR normal in all 4  Assessment: 1. Postsurgical hypothyroidism  2. Hypercalcemia - ? primary hyperparathyroidism (PTH not increased...)  3.  Vitamin D Insufficiency  Plan: 1. Postsurgical hypothyroidism -She had a very high TSH on 02/21/2019: 15.7.  At that time she was not taking the levothyroxine correctly and missing doses.  She was taking calcium and multivitamins close to levothyroxine and we moved them later in the day.  However, her TSH still returned high when it was checked during her admissions for encephalopathy in 07/2019.  We increased the levothyroxine dose at that time - latest thyroid labs reviewed with pt >> normal: Lab Results  Component Value Date   TSH 3.96 09/22/2019   - she is on LT4 175 mcg daily as per latest dispense from Pharmacy - but should have been on 150 mcg daly... I am not sure for how long she has been on the higher dose. On her med list there is only a 150 mcg dose. - pt feels good on this dose. - we discussed about taking the thyroid hormone every day, with water, >30 minutes before breakfast, separated by >4 hours from acid reflux medications, calcium, iron, multivitamins. Pt. is taking it correctly. - will check thyroid tests today: TSH and fT4 - If labs are abnormal, she will need to return for repeat  TFTs in 1.5 months  2. Hypercalcemia -Interestingly, calcium normalized after her thyroid surgery in 2016 -Reviewed previous investigation: Patient has had several instances of elevated calcium, with the highest being at 11.1.  Of note, her PTH levels were repeatedly checked and they were normal.  A PTHrp level was normal, multiple myeloma work-up was negative, vitamin A level was normal, vitamin D was normal or minimally low, she had a high 1, 25 dihydroxy vitamin D with a normal phosphorus.  Her urinary calcium was high in the past.  A day patient sestamibi scan, thyroid ultrasound, and neck CT did not show any parathyroid masses. -She has no apparent complications from hypercalcemia: No history of nephrolithiasis, abdominal pain -Latest calcium and phosphorus levels were normal: Lab Results  Component Value Date   CALCIUM 9.9 12/23/2019   PHOS 3.2 12/09/2013  -No further investigation is needed for this  2.  Vitamin D insufficiency -She has a history of low vitamin D, improved after restarting supplementation -platelet level was normal 07/2019 -She continues on 5000 units vitamin D daily. However, she is not 100% sure she is not taking this 2x a day - she will check at home and let  me know -We will recheck her level today  Component     Latest Ref Rng & Units 03/29/2020  T4,Free(Direct)     0.60 - 1.60 ng/dL 1.53  TSH     0.35 - 4.50 uIU/mL 1.21  Vitamin D, 25-Hydroxy     30.0 - 100.0 ng/mL 34.0  Tests are normal.  Philemon Kingdom, MD PhD [facility]

## 2020-03-29 NOTE — Patient Instructions (Signed)
  Please continue levothyroxine 175 mcg daily.  Take the thyroid hormone every day, with water, at least 30 minutes before breakfast, separated by at least 4 hours from: - acid reflux medications - calcium - iron - multivitamins  Please continue vitamin D 5000 units daily.  Please stop at the lab.  Please come back for a follow-up appointment in 6 months.

## 2020-03-30 ENCOUNTER — Telehealth: Payer: Self-pay | Admitting: Internal Medicine

## 2020-03-30 LAB — VITAMIN D 25 HYDROXY (VIT D DEFICIENCY, FRACTURES): Vit D, 25-Hydroxy: 34 ng/mL (ref 30.0–100.0)

## 2020-03-30 NOTE — Telephone Encounter (Signed)
Patient returned call regarding her labs - I advised her that per a note on her chart that Dr Elvera Lennox advised "that her thyroid test and vitamin D levels are normal. Please continue the same dose of thyroid medication and vitamin D supplement"  Patient acknowledged and verbalized understanding of the above.

## 2020-04-02 NOTE — Telephone Encounter (Signed)
Noted  

## 2020-04-09 DIAGNOSIS — G8929 Other chronic pain: Secondary | ICD-10-CM | POA: Diagnosis not present

## 2020-04-09 DIAGNOSIS — F339 Major depressive disorder, recurrent, unspecified: Secondary | ICD-10-CM | POA: Diagnosis not present

## 2020-04-09 DIAGNOSIS — I10 Essential (primary) hypertension: Secondary | ICD-10-CM | POA: Diagnosis not present

## 2020-04-09 DIAGNOSIS — E039 Hypothyroidism, unspecified: Secondary | ICD-10-CM | POA: Diagnosis not present

## 2020-04-09 DIAGNOSIS — E785 Hyperlipidemia, unspecified: Secondary | ICD-10-CM | POA: Diagnosis not present

## 2020-04-09 DIAGNOSIS — E7800 Pure hypercholesterolemia, unspecified: Secondary | ICD-10-CM | POA: Diagnosis not present

## 2020-04-09 DIAGNOSIS — E1169 Type 2 diabetes mellitus with other specified complication: Secondary | ICD-10-CM | POA: Diagnosis not present

## 2020-04-09 DIAGNOSIS — E119 Type 2 diabetes mellitus without complications: Secondary | ICD-10-CM | POA: Diagnosis not present

## 2020-04-09 DIAGNOSIS — G4700 Insomnia, unspecified: Secondary | ICD-10-CM | POA: Diagnosis not present

## 2020-04-17 DIAGNOSIS — I119 Hypertensive heart disease without heart failure: Secondary | ICD-10-CM | POA: Diagnosis not present

## 2020-04-17 DIAGNOSIS — I10 Essential (primary) hypertension: Secondary | ICD-10-CM | POA: Diagnosis not present

## 2020-04-17 DIAGNOSIS — I5032 Chronic diastolic (congestive) heart failure: Secondary | ICD-10-CM | POA: Diagnosis not present

## 2020-04-17 DIAGNOSIS — N1831 Chronic kidney disease, stage 3a: Secondary | ICD-10-CM | POA: Diagnosis not present

## 2020-04-18 ENCOUNTER — Ambulatory Visit: Payer: Medicare PPO | Admitting: Cardiology

## 2020-04-18 DIAGNOSIS — S0502XA Injury of conjunctiva and corneal abrasion without foreign body, left eye, initial encounter: Secondary | ICD-10-CM | POA: Diagnosis not present

## 2020-04-18 DIAGNOSIS — H1812 Bullous keratopathy, left eye: Secondary | ICD-10-CM | POA: Diagnosis not present

## 2020-04-26 DIAGNOSIS — S0502XD Injury of conjunctiva and corneal abrasion without foreign body, left eye, subsequent encounter: Secondary | ICD-10-CM | POA: Diagnosis not present

## 2020-04-26 DIAGNOSIS — H1812 Bullous keratopathy, left eye: Secondary | ICD-10-CM | POA: Diagnosis not present

## 2020-05-23 ENCOUNTER — Encounter (INDEPENDENT_AMBULATORY_CARE_PROVIDER_SITE_OTHER): Payer: Self-pay

## 2020-05-23 ENCOUNTER — Ambulatory Visit: Payer: Medicare PPO | Admitting: Cardiology

## 2020-05-23 ENCOUNTER — Other Ambulatory Visit: Payer: Self-pay

## 2020-05-23 ENCOUNTER — Encounter: Payer: Self-pay | Admitting: Cardiology

## 2020-05-23 VITALS — BP 152/58 | HR 49 | Ht 60.0 in | Wt 166.0 lb

## 2020-05-23 DIAGNOSIS — R072 Precordial pain: Secondary | ICD-10-CM | POA: Diagnosis not present

## 2020-05-23 DIAGNOSIS — R0602 Shortness of breath: Secondary | ICD-10-CM | POA: Diagnosis not present

## 2020-05-23 DIAGNOSIS — R001 Bradycardia, unspecified: Secondary | ICD-10-CM

## 2020-05-23 DIAGNOSIS — I340 Nonrheumatic mitral (valve) insufficiency: Secondary | ICD-10-CM

## 2020-05-23 DIAGNOSIS — I5032 Chronic diastolic (congestive) heart failure: Secondary | ICD-10-CM | POA: Diagnosis not present

## 2020-05-23 DIAGNOSIS — I10 Essential (primary) hypertension: Secondary | ICD-10-CM | POA: Diagnosis not present

## 2020-05-23 MED ORDER — HYDRALAZINE HCL 50 MG PO TABS
100.0000 mg | ORAL_TABLET | Freq: Three times a day (TID) | ORAL | 3 refills | Status: DC
Start: 2020-05-23 — End: 2020-11-20

## 2020-05-23 NOTE — Patient Instructions (Signed)
Medication Instructions:  Your physician has recommended you make the following change in your medication:  1) INCREASE hydralazine to 100 mg (2 tablets) three times per day *If you need a refill on your cardiac medications before your next appointment, please call your pharmacy*  Follow-Up: At [facility], you and your health needs are our priority.  As part of our continuing mission to provide you with exceptional heart care, we have created designated Provider Care Teams.  These Care Teams include your primary Cardiologist (physician) and Advanced Practice Providers (APPs -  Physician Assistants and Nurse Practitioners) who all work together to provide you with the care you need, when you need it.   Your next appointment:   6 month(s)  The format for your next appointment:   In Person  Provider:   You may see Armanda Magic, MD or one of the following Advanced Practice Providers on your designated Care Team:    [name], PA-C  Jacolyn Reedy, PA-C

## 2020-05-23 NOTE — Progress Notes (Signed)
Cardiology Office Note:    Date:  05/23/2020   ID:  Kimberly [name], DOB [birth year], MRN [id number]  PCP:  Clayborn Heron, MD  Cardiologist:  Armanda Magic, MD    Referring MD: Clayborn Heron, MD   Chief Complaint  Patient presents with  . Hypertension  . Congestive Heart Failure  . Shortness of Breath    History of Present Illness:    Kimberly [name] is a 80 y.o. female with a hx of chronic diastolic CHF, chronic lower extremity edema and hypertension.  She had a cath in 2010 due to CP showing normal coronary arteries with elevated LVEDP felt to be due to diastolic CHF vs. Microvascular angina. Her CP resolved for a while but reoccurred and nuclear stress test 09/2014 was normal.  2D echocardiogram 09/2014 showed normal LV function with EF 55 to 60% and diastolic dysfunction.  She was admitted February 2019 with junctional bradycardia, chest pain and shortness of breath.  Her heart rate was in the 40s.  Chest pressure was felt to be due to symptomatic junctional bradycardia.  Her clonidine and metoprolol were stopped.  2D echocardiogram at that time showed EF 55 to 60%.  Amlodipine was added for blood pressure control.  She was seen back in the office on 06/23/2017 her heart rate was 68 bpm.  Her weakness resolved.  She was trialed on hydralazine in the past but developed headache/dizziness after taking hydralazine so this was stopped. She was restarted on lower dose of amlodipine with titration of losartan to 50mg  daily. She has been on varying doses of amlodipine over the years as well as lisinopril, HCTZ, and chlorthalidone at one point. Amlodipine had to be reduced to 5mg  daily due to lower extremity edema. She had rise in creatinine with chlorthalidone 25mg  daily so this was discontinued.  She was seen 02/2019 by , PA for followup and had a lot of psychosocial issues.  Her BP was poorly controlled at 190/9mmHg and her Losartan was increased to 100mg  daily.  Amlodipine was  not titrated due to problems with LE edema in the past.  It was felt that uncontrolled depression and anxiety could also be playing a role in her BP elevations.  She was also felt to have depression and was referred back to her PCP to address her depression and antihypertensive meds so there were not multiple MDs making changes to her meds and causing confusion.   She has been seen multiple times by 03/2019, PA since I saw her last and was still having issues with BP and compliance.  It was felt she would benefit significantly from enrollment in a home-based primary care program to not only address the issue of transportation but also provide social support, medication reconciliation, access to home labwork, and close management of blood pressure. She was referedl to [facility] for this.  Her Losartan was increased to 100mg  daily due to poorly controlled HTN.    She is here today for followup and is doing well.  SHe denies any chest pain or pressure, SOB, DOE, PND, orthopnea, LE edema, dizziness, palpitations or syncope. SheSh is compliant with her meds and is tolerating meds with no SE.    Past Medical History:  Diagnosis Date  . Acid reflux   . Anxiety   . Arthritis   . Back spasm   . Bradycardia    a. junctional and sinus brady during admission 06/2017 requiring cessation of beta blocker and clonidine.  72m  Chronic diastolic heart failure (HCC) 03/20/2015  . Chronic edema   . CKD (chronic kidney disease), stage III (HCC)   . Depression   . Diabetes mellitus without complication (HCC)    diet controled  . Fuch's endothelial dystrophy    bilateral eyes  . Headache    occ  . HOH (hard of hearing)    wears hearing aid in left ear  . Hypercholesteremia   . Hypertensive heart disease with CHF (HCC) 09/11/2014  . Insomnia   . Junctional bradycardia   . Mild mitral regurgitation   . Mild tricuspid regurgitation   . Normal coronary arteries 2010   a. 2010 cath with no obstructive  epicardial disease.  Done for CP which was felt to be due to microvascular angina or diastolic HF.  . OSA on CPAP    Followed by Dr. Fannie Knee  . Patient's other noncompliance with medication regimen    per notes, gets confused easily with medications, difficult compliance     Past Surgical History:  Procedure Laterality Date  . ABDOMINAL HYSTERECTOMY    . APPENDECTOMY    . CARDIAC CATHETERIZATION     no CAD  . CESAREAN SECTION     x 2  . CHOLECYSTECTOMY    . COLONOSCOPY    . EYE SURGERY Right    implant  . ROTATOR CUFF REPAIR Left   . SHOULDER ARTHROSCOPY WITH SUBACROMIAL DECOMPRESSION AND BICEP TENDON REPAIR Left 08/24/2012   Procedure: SHOULDER ARTHROSCOPY WITH SUBACROMIAL DECOMPRESSION AND BICEP TENDON REPAIR;  Surgeon: Cammy Copa, MD;  Location: [facility] OR;  Service: Orthopedics;  Laterality: Left;  Left Shoulder Arthroscopy, Debridement, Biceps Tenotomy  . THYROIDECTOMY N/A 06/12/2015   Procedure: TOTAL THYROIDECTOMY;  Surgeon: Darnell Level, MD;  Location: [facility] OR;  Service: General;  Laterality: N/A;    Current Medications: Current Meds  Medication Sig  . amLODipine (NORVASC) 10 MG tablet Take 5 mg by mouth daily.   Marland Kitchen aspirin 81 MG chewable tablet Chew 81 mg by mouth daily.  . Cholecalciferol (VITAMIN D3) 5000 units CAPS Take 5,000 Units by mouth daily.  . cloNIDine (CATAPRES) 0.1 MG tablet Take 0.1 mg by mouth every 6 (six) hours as needed. If BP is over 180  . fenofibrate micronized (LOFIBRA) 200 MG capsule Take 200 mg by mouth daily before breakfast.  . hydrALAZINE (APRESOLINE) 50 MG tablet Take 1.5 tablets (75 mg total) by mouth 3 (three) times daily.  Marland Kitchen levothyroxine (SYNTHROID) 150 MCG tablet Take 1 tablet (150 mcg total) by mouth daily before breakfast.  . losartan (COZAAR) 100 MG tablet Take 50 mg by mouth daily.   . nitroGLYCERIN (NITROSTAT) 0.4 MG SL tablet Place 1 tablet (0.4 mg total) under the tongue every 5 (five) minutes as needed for chest pain.  Marland Kitchen  PARoxetine (PAXIL) 20 MG tablet Take 60 mg by mouth daily.      Allergies:   Percocet [oxycodone-acetaminophen], Codeine, Other, Phenergan [promethazine hcl], Clonidine derivatives, Hydralazine, and Metoprolol   Social History   Socioeconomic History  . Marital status: Married    Spouse name: Not on file  . Number of children: 2  . Years of education: Not on file  . Highest education level: Not on file  Occupational History  . Occupation: Radiographer, therapeutic  Tobacco Use  . Smoking status: Never Smoker  . Smokeless tobacco: Never Used  Vaping Use  . Vaping Use: Never used  Substance and Sexual Activity  . Alcohol use: No    Alcohol/week:  0.0 standard drinks  . Drug use: No  . Sexual activity: Not on file  Other Topics Concern  . Not on file  Social History Narrative  . Not on file   Social Determinants of Health   Financial Resource Strain: Not on file  Food Insecurity: Not on file  Transportation Needs: Not on file  Physical Activity: Not on file  Stress: Not on file  Social Connections: Not on file     Family History: The patient's family history includes Cancer in her sister; Stomach cancer in her mother.  ROS:   Please see the history of present illness.    ROS  All other systems reviewed and negative.   EKGs/Labs/Other Studies Reviewed:    The following studies were reviewed today:  2D echo 01/2020 IMPRESSIONS  1. Left ventricular ejection fraction, by estimation, is 60 to 65%. The  left ventricle has normal function. The left ventricle has no regional  wall motion abnormalities. Left ventricular diastolic parameters are  consistent with Grade II diastolic  dysfunction (pseudonormalization). Elevated left ventricular end-diastolic  pressure. The E/e' is 26.  2. Right ventricular systolic function is normal. The right ventricular  size is normal. There is mildly elevated pulmonary artery systolic  pressure. The estimated right ventricular  systolic pressure is 44.0 mmHg.  3. Left atrial size was moderately dilated.  4. Right atrial size was moderately dilated.  5. The mitral valve is normal in structure. Trivial mitral valve  regurgitation. No evidence of mitral stenosis.  6. The aortic valve is normal in structure. Aortic valve regurgitation is  not visualized. No aortic stenosis is present.  7. The inferior vena cava is normal in size with greater than 50%  respiratory variability, suggesting right atrial pressure of 3 mmHg.   Steffanie DunnLexiscan myoview 01/2020 Study Highlights    Nuclear stress EF: 65%.  Horizontal ST segment depression 2mm ST segment depression was noted during stress in the I and II leads. ST deviation beginning in recovery. Lead I has T wave inversions at baseline that persisted throughout.  This is a low risk study.  The left ventricular ejection fraction is normal (55-65%).   No ischemia or infarction on perfusion images. Normal wall motion.   EKG:  EKG is not ordered today.    Recent Labs: 12/23/2019: B Natriuretic Peptide 362.4; BUN 37; Creatinine, Ser 1.57; Hemoglobin 11.5; Platelets 221; Potassium 3.8; Sodium 139 03/29/2020: TSH 1.21   Recent Lipid Panel    Component Value Date/Time   CHOL 152 10/02/2015 0901   TRIG 169 (H) 10/02/2015 0901   HDL 30 (L) 10/02/2015 0901   CHOLHDL 5.1 (H) 10/02/2015 0901   VLDL 34 (H) 10/02/2015 0901   LDLCALC 88 10/02/2015 0901    Physical Exam:    VS:  BP (!) 152/58   Pulse (!) 49   Ht 5' (1.524 m)   Wt 166 lb (75.3 kg)   SpO2 96%   BMI 32.42 kg/m     Wt Readings from Last 3 Encounters:  05/23/20 166 lb (75.3 kg)  03/29/20 165 lb 12.8 oz (75.2 kg)  01/12/20 172 lb 9.6 oz (78.3 kg)     GEN: Well nourished, well developed in no acute distress HEENT: Normal NECK: No JVD; No carotid bruits LYMPHATICS: No lymphadenopathy CARDIAC:RRR, no murmurs, rubs, gallops RESPIRATORY:  Clear to auscultation without rales, wheezing or rhonchi   ABDOMEN: Soft, non-tender, non-distended MUSCULOSKELETAL:  Trace LE edema; No deformity  SKIN: Warm and dry NEUROLOGIC:  Alert and  oriented x 3 PSYCHIATRIC:  Normal affect    ASSESSMENT:    1. Essential hypertension   2. Chronic diastolic heart failure (HCC)   3. Junctional bradycardia   4. Mild mitral regurgitation   5. SOB (shortness of breath)   6. Precordial pain    PLAN:    In order of problems listed above:  1.  HTN -BP borderline controlled on exam today -she was taken off BB due to bradycardia -continue amlodipine 5mg  daily and Losartan 50mg  daily -will not increase Losartan due to CKD stage 3a -increase Hydralazine to 100mg  TID -followup with PharmD in HTN clinic in 1 weeks  2.  Chronic diastolic CHF -she dose not appear volume overloaded on exam today -weight is stable -currently she is not taking diuretic therapy  3.  Junctional Bradycardia -she had not had any dizziness or syncope -no further episodes off BB  4.  Mitral regurgitation -no murmur on exam  5.  SOB/Chest pain -this has resolved and suspect this was driven by her poorly controlled HTN -2D echo showed normal LVF and mild pulmonary HTN with G2DD - showed no ischemia -she does not appear volume overloaded on exam   Medication Adjustments/Labs and Tests Ordered: Current medicines are reviewed at length with the patient today.  Concerns regarding medicines are outlined above.  No orders of the defined types were placed in this encounter.  No orders of the defined types were placed in this encounter.   Signed, , MD  05/23/2020 4:02 PM    [facility]

## 2020-05-23 NOTE — Addendum Note (Signed)
Addended by: Theresia Majors on: 05/23/2020 04:16 PM   Modules accepted: Orders

## 2020-05-24 DIAGNOSIS — Z043 Encounter for examination and observation following other accident: Secondary | ICD-10-CM | POA: Diagnosis not present

## 2020-05-29 ENCOUNTER — Other Ambulatory Visit: Payer: Self-pay | Admitting: Cardiology

## 2020-05-31 ENCOUNTER — Ambulatory Visit: Payer: Medicare PPO

## 2020-06-06 DIAGNOSIS — G4733 Obstructive sleep apnea (adult) (pediatric): Secondary | ICD-10-CM | POA: Diagnosis not present

## 2020-08-20 DIAGNOSIS — I10 Essential (primary) hypertension: Secondary | ICD-10-CM | POA: Diagnosis not present

## 2020-08-20 DIAGNOSIS — J111 Influenza due to unidentified influenza virus with other respiratory manifestations: Secondary | ICD-10-CM | POA: Diagnosis not present

## 2020-09-13 ENCOUNTER — Telehealth: Payer: Self-pay | Admitting: Internal Medicine

## 2020-09-13 DIAGNOSIS — I10 Essential (primary) hypertension: Secondary | ICD-10-CM | POA: Diagnosis not present

## 2020-09-13 DIAGNOSIS — I5032 Chronic diastolic (congestive) heart failure: Secondary | ICD-10-CM | POA: Diagnosis not present

## 2020-09-13 DIAGNOSIS — E7800 Pure hypercholesterolemia, unspecified: Secondary | ICD-10-CM | POA: Diagnosis not present

## 2020-09-13 DIAGNOSIS — F411 Generalized anxiety disorder: Secondary | ICD-10-CM | POA: Diagnosis not present

## 2020-09-13 DIAGNOSIS — E039 Hypothyroidism, unspecified: Secondary | ICD-10-CM | POA: Diagnosis not present

## 2020-09-13 DIAGNOSIS — E1169 Type 2 diabetes mellitus with other specified complication: Secondary | ICD-10-CM | POA: Diagnosis not present

## 2020-09-13 NOTE — Telephone Encounter (Signed)
[facility] called to get clarification on a dosage for pt's levothyroxine. We were sending 150 mcg last year for the patient and now another office is sending 170 mcg. They would like to know if pt should stay with 170 mcg or switch back?  Callback # [phone] ask for Leotis Shames

## 2020-09-13 NOTE — Telephone Encounter (Signed)
Called and left a detailed message advising the pharmacy of the rx and directions.

## 2020-09-13 NOTE — Telephone Encounter (Signed)
Please advise med list has 150 mcg but documented that pt is taking differently 170 mcg.

## 2020-09-13 NOTE — Telephone Encounter (Signed)
T, per my last visit note, we had her on 150 before but she was given 175 from the pharmacy apparently by mistake.  Since her tests were normal on this dose, I just continued it.  Can you please clarify with the pharmacy that she is on the 175 mcg?

## 2020-09-24 DIAGNOSIS — H401131 Primary open-angle glaucoma, bilateral, mild stage: Secondary | ICD-10-CM | POA: Diagnosis not present

## 2020-09-24 DIAGNOSIS — H18513 Endothelial corneal dystrophy, bilateral: Secondary | ICD-10-CM | POA: Diagnosis not present

## 2020-09-24 DIAGNOSIS — R7309 Other abnormal glucose: Secondary | ICD-10-CM | POA: Diagnosis not present

## 2020-09-28 ENCOUNTER — Encounter: Payer: Self-pay | Admitting: Internal Medicine

## 2020-09-28 ENCOUNTER — Other Ambulatory Visit: Payer: Self-pay

## 2020-09-28 ENCOUNTER — Ambulatory Visit: Payer: Medicare PPO | Admitting: Internal Medicine

## 2020-09-28 VITALS — BP 150/78 | HR 67 | Ht 60.0 in | Wt 143.6 lb

## 2020-09-28 DIAGNOSIS — E890 Postprocedural hypothyroidism: Secondary | ICD-10-CM

## 2020-09-28 DIAGNOSIS — E559 Vitamin D deficiency, unspecified: Secondary | ICD-10-CM

## 2020-09-28 LAB — T4, FREE: Free T4: 1.38 ng/dL (ref 0.60–1.60)

## 2020-09-28 LAB — TSH: TSH: 1.14 u[IU]/mL (ref 0.35–4.50)

## 2020-09-28 NOTE — Progress Notes (Signed)
Patient ID: Kimberly [name], female   DOB: 1941/01/03, 80 y.o.   MRN: [id number]  This visit occurred during the SARS-CoV-2 public health emergency.  Safety protocols were in place, including screening questions prior to the visit, additional usage of staff PPE, and extensive cleaning of exam room while observing appropriate contact time as indicated for disinfecting solutions.   HPI  Kimberly [name] is a 80 y.o.-year-old female, returning for f/u for hypercalcemia, dx 2012, postsurgical hypothyroidism. Last visit was 6 months ago.  She is limited in coming for the appointments by the need for transportation.  Interim history: Before last visit she was admitted in 07/2019 with encephalopathy either related to UTI or hypertensive.  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  He then developed dementia and follow-up feeding problems and died 2 months ago.  He is very affected by this - they were married 43 years. In the last 4 months, she lost 23 pounds.  She mentioned that she is eating fairly well. She is trying to stay busy.  Postsurgical hypothyroidism: - She had thyroidectomy for substernal goiter in 05/2015: Benign nodule pathology  Reviewed history: She had problems with compliance with her levothyroxine in the past.  In 06/2017 he was hospitalized and was found to have a junctional bradycardia with a TSH of 42.  At that time, daughter reported that she was not taking the levothyroxine consistently.  Afterwards, she started to put the medication bottle on her nightstand and was consistently taking it daily.  However, she then had a TSH level that was high, at 15.  TSH remains high so we increased the dose to 150 mcg daily in 07/2019.  At last visit, she was supposed to be on levothyroxine 150 mcg daily (increased 07/2019), but [facility] gave her 175 mcg per review of her pill pack... However, since her  TFTs were normal then, we continued the same dose.  She takes this: - in am - fasting - at least 30 min from b'fast - no calcium  - no iron - no multivitamins - no PPIs - not on Biotin  Reviewed her TFTs: Lab Results  Component Value Date   TSH 1.21 03/29/2020   TSH 3.96 09/22/2019   TSH 5.89 (H) 07/20/2019   TSH 6.11 (H) 04/13/2019   TSH 15.700 (H) 02/21/2019   TSH 3.93 06/14/2018   TSH 1.74 09/24/2017   TSH 16.43 (H) 06/25/2017   TSH 42.727 (H) 06/12/2017   TSH 41.60 (H) 01/16/2016   FREET4 1.53 03/29/2020   FREET4 1.25 09/22/2019   FREET4 1.18 07/20/2019   FREET4 1.23 04/13/2019   FREET4 0.98 06/14/2018   FREET4 1.19 09/24/2017   FREET4 1.06 06/25/2017   FREET4 0.84 01/16/2016   FREET4 0.83 09/18/2015  07/31/2015: TSH 58  Surprisingly, her calcium has improved after parathyroid surgery in 2016  Primary hyperparathyroidism: -Greatly improved from 2016  Reviewed recent labs: Lab Results  Component Value Date   PTH 19 09/18/2015   PTH Comment 09/18/2015   PTH 21 02/08/2015   PTH Comment 02/08/2015   PTH 17 12/09/2013   PTH Comment 12/09/2013   PTH 14.9 08/24/2012   CALCIUM 9.9 12/23/2019   CALCIUM 10.0 10/20/2019   CALCIUM 9.7 10/12/2019   CALCIUM 9.2 05/11/2019   CALCIUM 9.9 05/10/2019   CALCIUM 10.0 02/21/2019   CALCIUM 10.2 07/05/2018   CALCIUM 9.9 06/16/2018   CALCIUM 10.2 06/14/2018  CALCIUM 9.7 06/13/2017  12/25/2014: Calcium 10.8 (8.6-10.3) 09/12/2013: Ca 10.8, iCa 5.7 (4.5-5.6)  Has a history of vitamin D deficiency: Lab Results  Component Value Date   VD25OH 34.0 03/29/2020   VD25OH 34.49 07/20/2019   VD25OH 18.27 (L) 04/13/2019   VD25OH 57.47 06/14/2018   VD25OH 61.83 09/24/2017   VD25OH 32.67 09/18/2015   VD25OH 28.65 (L) 02/08/2015   VD25OH 50.59 12/09/2013   She continues on vitamin D 5000 units daily.  Reviewed other pertinent labs: Component     Latest Ref Rng 02/08/2015 02/08/2015         2:14 PM  2:14 PM  Vitamin A  (Retinoic Acid)     38 - 98 mcg/dL 89    Component     Latest Ref Rng 12/09/2013  Vitamin D 1, 25 (OH) Total     18 - 72 pg/mL 79 (H)  Vitamin D3 1, 25 (OH)      79  Vitamin D2 1, 25 (OH)      <8  PTH-Related Protein (PTH-RP)     14 - 27 pg/mL 16  Phosphorus     2.3 - 4.6 mg/dL 3.2   Her urine calcium was elevated: Component     Latest Ref Rng 01/20/2014  Calcium, Ur      14  Calcium, 24 hour urine     100 - 250 mg/day 322 (H)  Creatinine, Urine      51.9  Creatinine, 24H Ur     700 - 1800 mg/day 1194   No history of kidney stones. No history of osteoporosis.  No fractures.  Reviewed her DXA scan report from 2008:  [place] (L1-L3)  BMD: 1.273  T-score (% of young adult value): 2.3  Z-score (% adult age match value): 4.1  LEFT HIP (NECK)  BMD: 0.831  T-score (% of young adult value): -0.2  Z-score (% adult age match value): 1.4  Assessment: This patient is considered normal according to [facility]) criteria.   She has CKD (Dr. Moshe Cipro).  Latest BUN/creatinine reviewed: Lab Results  Component Value Date   BUN 37 (H) 12/23/2019   CREATININE 1.57 (H) 12/23/2019    She has had a technetium sestamibi scan (04/2015) that was negative for a parathyroid adenoma e and a CT scan of her neck (04/2015) there was also negative for possible parathyroid nodule nodule but this showed a substernal goiter with tracheal narrowing.  She also has a history of DM2 (managed by PCP), HTN, HL.   ROS: Constitutional: + weight loss, + fatigue, no subjective hyperthermia, no subjective hypothermia Eyes: no blurry vision, no xerophthalmia ENT: no sore throat, no nodules palpated in neck, no dysphagia, no odynophagia, no hoarseness Cardiovascular: no CP/no SOB/no palpitations/+ leg swelling Respiratory: no cough/no SOB/no wheezing Gastrointestinal: no N/no V/no D/no C/no acid reflux Musculoskeletal: no muscle aches/no joint aches Skin: no rashes, no hair  loss Neurological: no tremors/no numbness/no tingling/no dizziness  I reviewed pt's medications, allergies, PMH, social hx, family hx, and changes were documented in the history of present illness. Otherwise, unchanged from my initial visit note.  Past Medical History:  Diagnosis Date  . Acid reflux   . Anxiety   . Arthritis   . Back spasm   . Bradycardia    a. junctional and sinus brady during admission 06/2017 requiring cessation of beta blocker and clonidine.  . Chronic diastolic heart failure ([place]) 03/20/2015  . Chronic edema   . CKD (chronic kidney disease), stage  III ([place])   . Depression   . Diabetes mellitus without complication ([place])    diet controled  . Fuch's endothelial dystrophy    bilateral eyes  . Headache    occ  . HOH (hard of hearing)    wears hearing aid in left ear  . Hypercholesteremia   . Hypertensive heart disease with CHF ([place]) 09/11/2014  . Insomnia   . Junctional bradycardia   . Mild mitral regurgitation   . Mild tricuspid regurgitation   . Normal coronary arteries 2010   a. 2010 cath with no obstructive epicardial disease.  Done for CP which was felt to be due to microvascular angina or diastolic HF.  . OSA on CPAP    Followed by Dr. Keturah Barre  . Patient's other noncompliance with medication regimen    per notes, gets confused easily with medications, difficult compliance    Past Surgical History:  Procedure Laterality Date  . ABDOMINAL HYSTERECTOMY    . APPENDECTOMY    . CARDIAC CATHETERIZATION     no CAD  . CESAREAN SECTION     x 2  . CHOLECYSTECTOMY    . COLONOSCOPY    . EYE SURGERY Right    implant  . ROTATOR CUFF REPAIR Left   . SHOULDER ARTHROSCOPY WITH SUBACROMIAL DECOMPRESSION AND BICEP TENDON REPAIR Left 08/24/2012   Procedure: SHOULDER ARTHROSCOPY WITH SUBACROMIAL DECOMPRESSION AND BICEP TENDON REPAIR;  Surgeon: Meredith Pel, MD;  Location: [place];  Service: Orthopedics;  Laterality: Left;  Left Shoulder Arthroscopy,  Debridement, Biceps Tenotomy  . THYROIDECTOMY N/A 06/12/2015   Procedure: TOTAL THYROIDECTOMY;  Surgeon: Armandina Gemma, MD;  Location: [place];  Service: General;  Laterality: N/A;   History   Social History  . Marital Status: Married    Spouse Name: N/A    Number of Children: 2   Occupational History  . retired-assistant teacher    Social History Main Topics  . Smoking status: Never Smoker   . Smokeless tobacco: Not on file  . Alcohol Use: No  . Drug Use: No   Current Outpatient Medications on File Prior to Visit  Medication Sig Dispense Refill  . amLODipine (NORVASC) 5 MG tablet TAKE ONE TABLET BY MOUTH EVERY MORNING 30 tablet 6  . aspirin 81 MG chewable tablet Chew 81 mg by mouth daily.    . Cholecalciferol (VITAMIN D3) 5000 units CAPS Take 5,000 Units by mouth daily.    . cloNIDine (CATAPRES) 0.1 MG tablet Take 0.1 mg by mouth every 6 (six) hours as needed. If BP is over 180    . fenofibrate micronized (LOFIBRA) 200 MG capsule Take 200 mg by mouth daily before breakfast.    . hydrALAZINE (APRESOLINE) 50 MG tablet Take 2 tablets (100 mg total) by mouth 3 (three) times daily. 540 tablet 3  . levothyroxine (SYNTHROID) 175 MCG tablet TAKE ONE TABLET BY MOUTH BEFORE BREAKFAST    . losartan (COZAAR) 100 MG tablet Take 50 mg by mouth daily.     . nitroGLYCERIN (NITROSTAT) 0.4 MG SL tablet Place 1 tablet (0.4 mg total) under the tongue every 5 (five) minutes as needed for chest pain. 25 tablet 6  . PARoxetine (PAXIL) 40 MG tablet 1.5 tablets     No current facility-administered medications on file prior to visit.   Allergies  Allergen Reactions  . Percocet [Oxycodone-Acetaminophen] Shortness Of Breath and Other (See Comments)    Pt couldn't breathe or catch her breath  . Codeine Other (See Comments)  hyperactivity  . Other Other (See Comments)    Some pain medication given at [facility] for surgery caused hallucinations   . Phenergan [Promethazine Hcl] Other (See Comments)     Hallucinations   . Clonidine Derivatives     Bradycardia (06/2017)  . Hydralazine     Headache/dizzy  . Metoprolol     Bradycardia 06/2017    Family History  Problem Relation Age of Onset  . Stomach cancer Mother   . Cancer Sister        intestinal   PE: BP (!) 150/78 (BP Location: Right Arm, Patient Position: Sitting, Cuff Size: Normal)   Pulse 67   Ht 5' (1.524 m)   Wt 143 lb 9.6 oz (65.1 kg)   SpO2 96%   BMI 28.04 kg/m   Wt Readings from Last 3 Encounters:  09/28/20 143 lb 9.6 oz (65.1 kg)  05/23/20 166 lb (75.3 kg)  03/29/20 165 lb 12.8 oz (75.2 kg)   Constitutional: overweight, in NAD Eyes: PERRLA, EOMI, no exophthalmos ENT: moist mucous membranes, no thyromegaly, no cervical lymphadenopathy Cardiovascular: RRR, No RG, + 2/6 SEM, + B LE pitting edema Respiratory: CTA B Gastrointestinal: abdomen soft, NT, ND, BS+ Musculoskeletal: no deformities, strength intact in all 4 Skin: moist, warm, no rashes Neurological: no tremor with outstretched hands, DTR normal in all 4  Assessment: 1. Postsurgical hypothyroidism  2. H/o Hypercalcemia - ? primary hyperparathyroidism  3.  Vitamin D Insufficiency  Plan: 1. Postsurgical hypothyroidism -She had a very high TSH on 02/21/2019: 15.7.  At that time she was not taking the levothyroxine correctly and missing doses.  She was taking calcium and multivitamins close to levothyroxine and we moved them later in the day.  However, TSH returned high when it was checked during her admission for encephalopathy in 07/2019.  We increased her levothyroxine dose at that time. - latest thyroid labs reviewed with pt >> normal: Lab Results  Component Value Date   TSH 1.21 03/29/2020   - she continues on LT4 175 mcg daily - pt feels good on this dose, but she lost a significant amount of weight since last visit, which definitely could be caused by grief and also by thyrotoxicosis.  Her heart rate is not high today and she does not have  other thyrotoxicosis signs or symptoms. - we discussed about taking the thyroid hormone every day, with water, >30 minutes before breakfast, separated by >4 hours from acid reflux medications, calcium, iron, multivitamins. Pt. is taking it correctly. - will check thyroid tests today: TSH and fT4 - If labs are abnormal, she will need to return for repeat TFTs in 1.5 months - OTW, I will see her back in a year  2. Hypercalcemia - interestingly, calcium normalized after her thyroid surgery in 2016 -Reviewed previous investigation: Patient has had several instances of elevated calcium, with the highest being at 11.1.  Of note, her PTH levels were repeatedly checked and they were normal.  A PTHrp level was normal, multiple myeloma work-up was negative, vitamin A level was normal, vitamin D was normal or minimally low, she had a high 1, 25 dihydroxy vitamin D with a normal phosphorus.  Her urinary calcium was high in the past.  A day patient sestamibi scan, thyroid ultrasound, and neck CT did not show any parathyroid masses. -She has no apparent complications from hypercalcemia: No history of nephrolithiasis, abdominal pain -Latest calcium and phosphorus levels were normal Lab Results  Component Value Date   CALCIUM  9.9 12/23/2019   PHOS 3.2 12/09/2013  -No further investigation is needed for this  2.  Vitamin D insufficiency -She has a history of low vitamin D but this was normal at last visit, in 03/2020, at 66 -She takes 5000 units vitamin D daily -We will recheck her level now  Component     Latest Ref Rng & Units 09/28/2020  T4,Free(Direct)     0.60 - 1.60 ng/dL 1.38  TSH     0.35 - 4.50 uIU/mL 1.14  Vitamin D, 25-Hydroxy     30.0 - 100.0 ng/mL 27.9 (L)  Vitamin D is low.  I will check with her if she missed any doses of vitamin D. TFTs are normal.  Philemon Kingdom, MD PhD [facility] Endocrinology

## 2020-09-28 NOTE — Patient Instructions (Signed)
  Please continue levothyroxine 175 mcg daily.  Take the thyroid hormone every day, with water, at least 30 minutes before breakfast, separated by at least 4 hours from: - acid reflux medications - calcium - iron - multivitamins  Please continue vitamin D 5000 units daily.  Please stop at the lab.  Please come back for a follow-up appointment in 1 year.

## 2020-09-29 LAB — VITAMIN D 25 HYDROXY (VIT D DEFICIENCY, FRACTURES): Vit D, 25-Hydroxy: 27.9 ng/mL — ABNORMAL LOW (ref 30.0–100.0)

## 2020-10-17 DIAGNOSIS — G4733 Obstructive sleep apnea (adult) (pediatric): Secondary | ICD-10-CM | POA: Diagnosis not present

## 2020-10-17 IMAGING — CR DG CHEST 2V
2 series · 2 of 2 positions shown · non-contrast
Comparison: None.

CLINICAL DATA: Shortness of breath

EXAM:
CHEST - 2 VIEW

[chest pa]
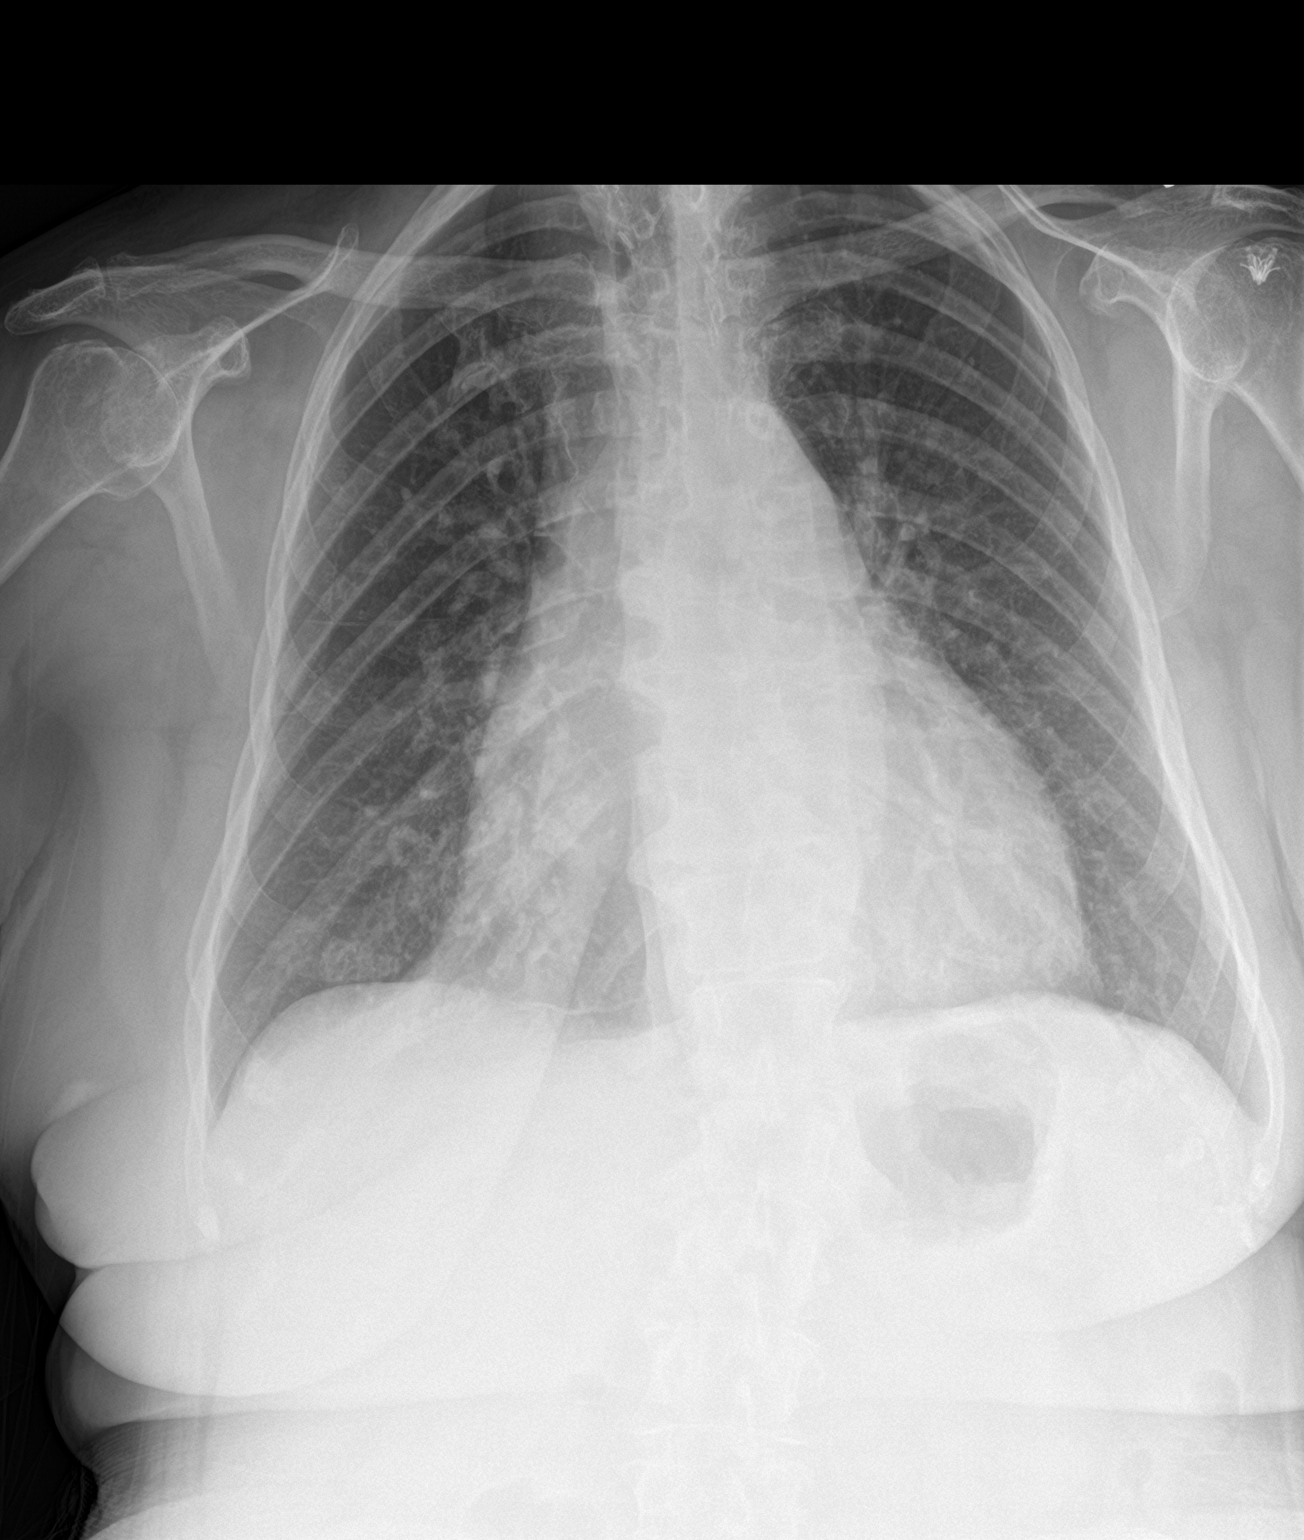

[chest lat]
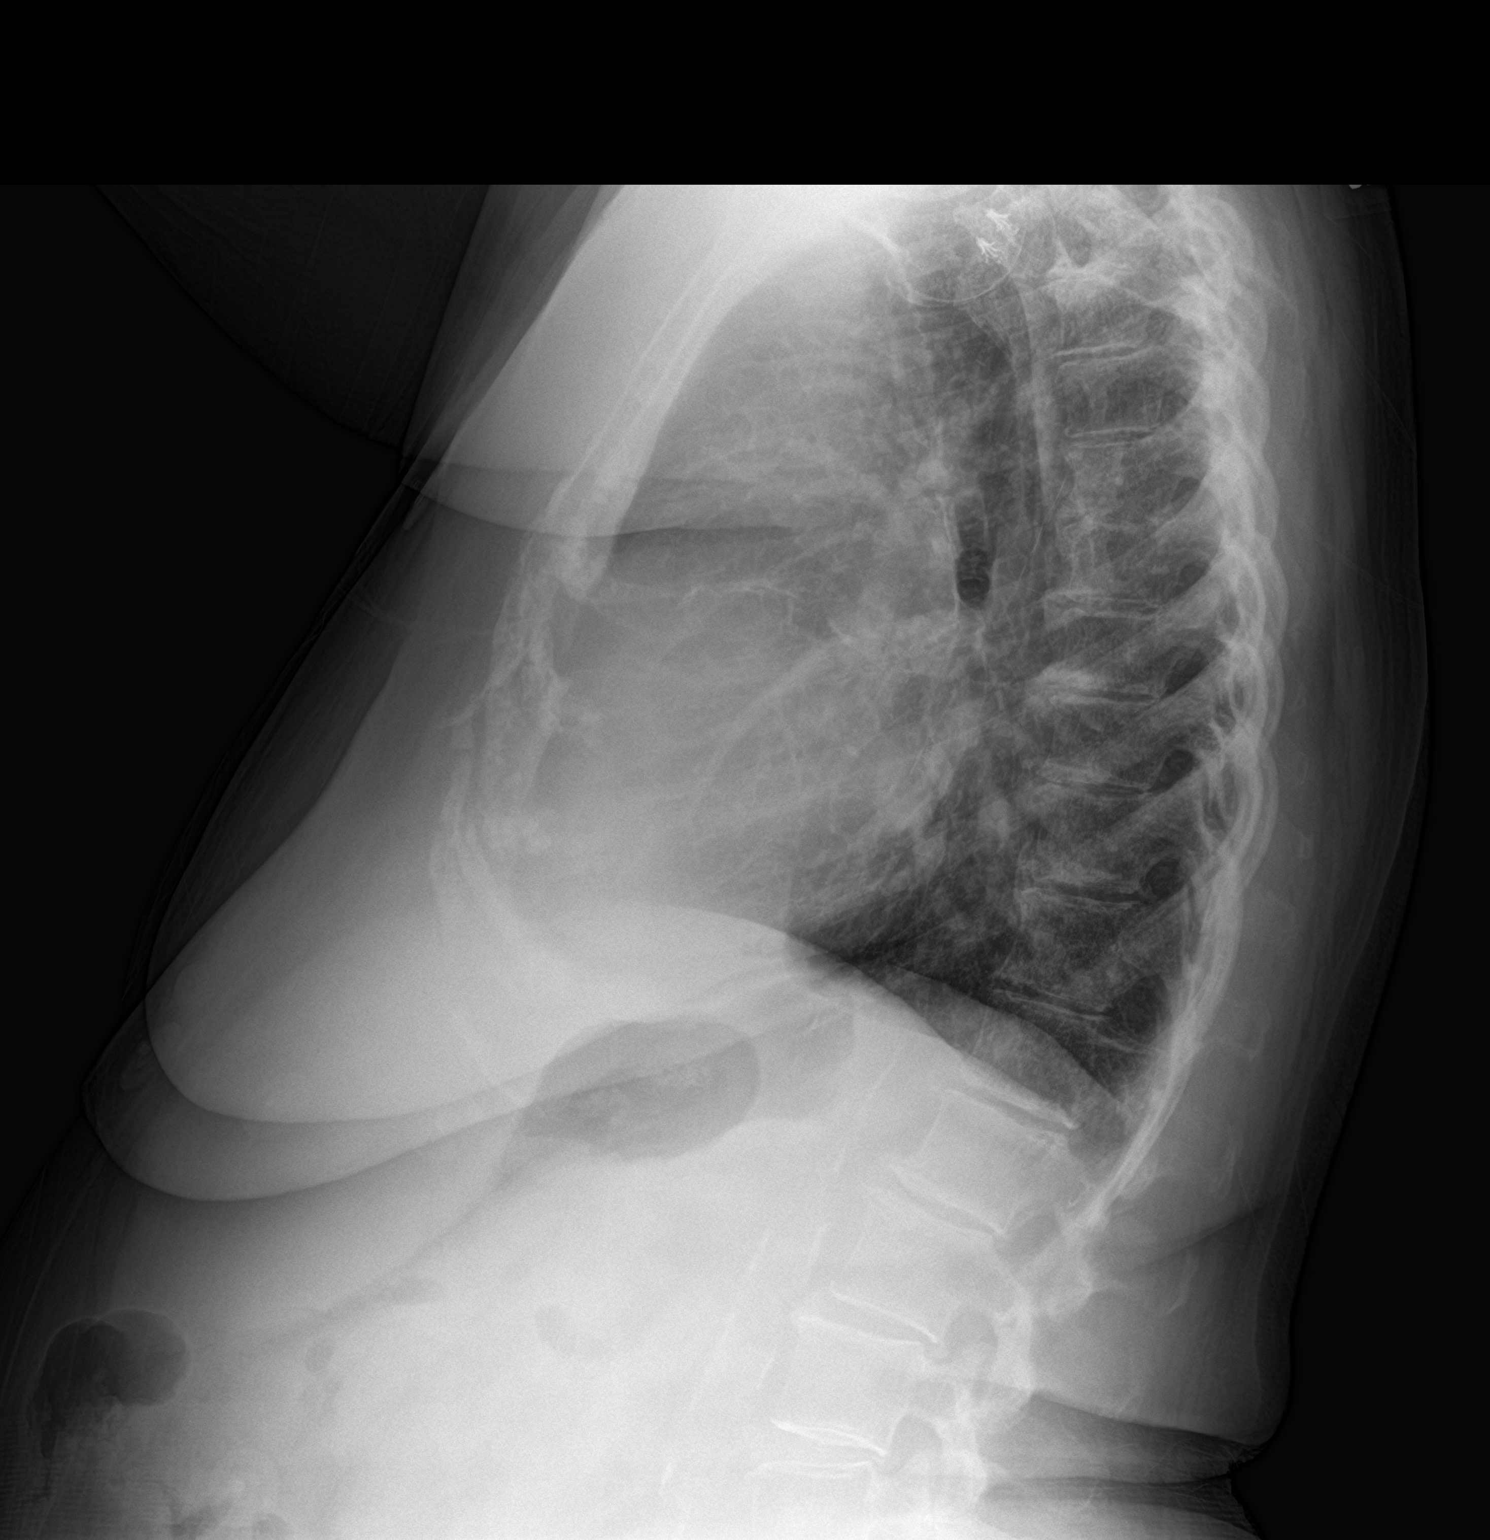

[2 of 2 positions shown; findings below may reference images not displayed]

FINDINGS: The heart size and mediastinal contours are mildly enlarged. There
is mild prominence of the central pulmonary vasculature. No large
airspace consolidation or pleural effusion. No acute osseous
abnormality.
IMPRESSION: Mild cardiomegaly and pulmonary vascular congestion.

## 2020-10-24 ENCOUNTER — Other Ambulatory Visit: Payer: Self-pay | Admitting: Cardiology

## 2020-10-31 ENCOUNTER — Other Ambulatory Visit: Payer: Self-pay | Admitting: Cardiology

## 2020-11-05 ENCOUNTER — Other Ambulatory Visit: Payer: Self-pay | Admitting: Cardiology

## 2020-11-09 DIAGNOSIS — H9190 Unspecified hearing loss, unspecified ear: Secondary | ICD-10-CM | POA: Diagnosis not present

## 2020-11-09 DIAGNOSIS — H903 Sensorineural hearing loss, bilateral: Secondary | ICD-10-CM | POA: Diagnosis not present

## 2020-11-09 DIAGNOSIS — F809 Developmental disorder of speech and language, unspecified: Secondary | ICD-10-CM | POA: Diagnosis not present

## 2020-11-20 ENCOUNTER — Encounter: Payer: Self-pay | Admitting: Cardiology

## 2020-11-20 ENCOUNTER — Ambulatory Visit: Payer: Medicare PPO | Admitting: Cardiology

## 2020-11-20 ENCOUNTER — Other Ambulatory Visit: Payer: Self-pay

## 2020-11-20 VITALS — BP 152/60 | HR 57 | Ht 60.0 in | Wt 140.2 lb

## 2020-11-20 DIAGNOSIS — R001 Bradycardia, unspecified: Secondary | ICD-10-CM

## 2020-11-20 DIAGNOSIS — I5032 Chronic diastolic (congestive) heart failure: Secondary | ICD-10-CM

## 2020-11-20 DIAGNOSIS — I10 Essential (primary) hypertension: Secondary | ICD-10-CM

## 2020-11-20 MED ORDER — AMLODIPINE BESYLATE 10 MG PO TABS
10.0000 mg | ORAL_TABLET | Freq: Every day | ORAL | 3 refills | Status: DC
Start: 2020-11-20 — End: 2020-11-22

## 2020-11-20 MED ORDER — LOSARTAN POTASSIUM 50 MG PO TABS: 50.0000 mg | ORAL_TABLET | Freq: Every morning | ORAL | 3 refills | Status: DC

## 2020-11-20 MED ORDER — HYDRALAZINE HCL 50 MG PO TABS: 100.0000 mg | ORAL_TABLET | Freq: Three times a day (TID) | ORAL | 3 refills | Status: DC

## 2020-11-20 NOTE — Patient Instructions (Addendum)
Medication Instructions:  Your physician has recommended you make the following change in your medication: 1) INCREASE amlodipine to 10 mg daily  *If you need a refill on your cardiac medications before your next appointment, please call your pharmacy*  Follow-Up: At [facility], you and your health needs are our priority.  As part of our continuing mission to provide you with exceptional heart care, we have created designated Provider Care Teams.  These Care Teams include your primary Cardiologist (physician) and Advanced Practice Providers (APPs -  Physician Assistants and Nurse Practitioners) who all work together to provide you with the care you need, when you need it.  Your next appointment:   6 month(s)  The format for your next appointment:   In Person  Provider:   You may see Armanda Magic, MD or one of the following Advanced Practice Providers on your designated Care Team:   [name], PA-C Jacolyn Reedy, PA-C   Other Instructions You have been referred to see the pharmacist in our Hypertension clinic in 2-3 weeks.

## 2020-11-20 NOTE — Addendum Note (Signed)
Addended by: Theresia Majors on: 11/20/2020 09:37 AM   Modules accepted: Orders

## 2020-11-20 NOTE — Progress Notes (Signed)
Cardiology Office Note:    Date:  11/20/2020   ID:  Kimberly PlanBonnie C [name], DOB 1940-09-19, MRN [id number]  PCP:  Clayborn Heronankins, [name], MD  Cardiologist:  Armanda Magicraci Davyn Elsasser, MD    Referring MD: Clayborn Heronankins, [name], MD   Chief Complaint  Patient presents with   Hypertension   Congestive Heart Failure     History of Present Illness:    Kimberly [name] is a 80 y.o. female with a hx of chronic diastolic CHF, chronic lower extremity edema and hypertension.  She had a cath in 2010 due to CP showing normal coronary arteries with elevated LVEDP  felt to be due to diastolic CHF vs. Microvascular angina. Her CP resolved for a while but reoccurred and nuclear stress test 09/2014 was normal.  2D echocardiogram 09/2014 showed normal LV function with EF 55 to 60% and diastolic dysfunction.  She was admitted February 2019 with junctional bradycardia, chest pain and shortness of breath.  Her heart rate was in the 40s.  Chest pressure was felt to be due to symptomatic junctional bradycardia.  Her clonidine and metoprolol were stopped.  2D echocardiogram at that time showed EF 55 to 60%.  Amlodipine was added for blood pressure control.  She was seen back in the office on 06/23/2017 her heart rate was 68 bpm.  Her weakness resolved.  She was trialed on hydralazine in the past but developed headache/dizziness after taking hydralazine so this was stopped. She was restarted on lower dose of amlodipine with titration of losartan to 50mg  daily. She has been on varying doses of amlodipine over the years as well as lisinopril, HCTZ, and chlorthalidone at one point. Amlodipine had to be reduced to 5mg  daily due to lower extremity edema. She had rise in creatinine with chlorthalidone 25mg  daily so this was discontinued.  She was seen 02/2019 by Ronie Spiesayna Dunn, PA for followup and had a lot of psychosocial issues.  Her BP was poorly controlled at 190/878mmHg and her Losartan was increased to 100mg  daily.  Amlodipine was not titrated due to  problems with LE edema in the past.  It was felt that uncontrolled depression and anxiety could also be playing a role in her BP elevations.  She was also felt to have depression and was referred back to her PCP to address her depression and antihypertensive meds so there were not multiple MDs making changes to her meds and causing confusion.   She has been seen multiple times by Ronie Spiesayna Dunn, PA since I saw her last and was still having issues with BP and compliance.  It was felt she would benefit significantly from enrollment in a home-based primary care program to not only address the issue of transportation but also provide social support, medication reconciliation, access to home labwork, and close management of blood pressure. She was referedl to [facility] for this.  She is here today for followup and is doing well.  She denies any chest pain or pressure, SOB, DOE, PND, orthopnea, LE edema, dizziness, palpitations or syncope. She is compliant with her meds and is tolerating meds with no SE.    Past Medical History:  Diagnosis Date   Acid reflux    Anxiety    Arthritis    Back spasm    Bradycardia    a. junctional and sinus brady during admission 06/2017 requiring cessation of beta blocker and clonidine.   Chronic diastolic heart failure (HCC) 03/20/2015   Chronic edema    CKD (chronic kidney disease), stage  III (HCC)    Depression    Diabetes mellitus without complication (HCC)    diet controled   Fuch's endothelial dystrophy    bilateral eyes   Headache    occ   HOH (hard of hearing)    wears hearing aid in left ear   Hypercholesteremia    Hypertensive heart disease with CHF (HCC) 09/11/2014   Insomnia    Junctional bradycardia    Mild mitral regurgitation    Mild tricuspid regurgitation    Normal coronary arteries 2010   a. 2010 cath with no obstructive epicardial disease.  Done for CP which was felt to be due to microvascular angina or diastolic HF.   OSA on CPAP    Followed  by Dr. Fannie Knee   Patient's other noncompliance with medication regimen    per notes, gets confused easily with medications, difficult compliance     Past Surgical History:  Procedure Laterality Date   ABDOMINAL HYSTERECTOMY     APPENDECTOMY     CARDIAC CATHETERIZATION     no CAD   CESAREAN SECTION     x 2   CHOLECYSTECTOMY     COLONOSCOPY     EYE SURGERY Right    implant   ROTATOR CUFF REPAIR Left    SHOULDER ARTHROSCOPY WITH SUBACROMIAL DECOMPRESSION AND BICEP TENDON REPAIR Left 08/24/2012   Procedure: SHOULDER ARTHROSCOPY WITH SUBACROMIAL DECOMPRESSION AND BICEP TENDON REPAIR;  Surgeon: Cammy Copa, MD;  Location: [facility] OR;  Service: Orthopedics;  Laterality: Left;  Left Shoulder Arthroscopy, Debridement, Biceps Tenotomy   THYROIDECTOMY N/A 06/12/2015   Procedure: TOTAL THYROIDECTOMY;  Surgeon: Darnell Level, MD;  Location: [facility] OR;  Service: General;  Laterality: N/A;    Current Medications: Current Meds  Medication Sig   amLODipine (NORVASC) 5 MG tablet TAKE ONE TABLET BY MOUTH EVERY MORNING   aspirin 81 MG chewable tablet Chew 81 mg by mouth daily.   Cholecalciferol (VITAMIN D3) 5000 units CAPS Take 5,000 Units by mouth daily.   cloNIDine (CATAPRES) 0.1 MG tablet Take 0.1 mg by mouth every 6 (six) hours as needed. If BP is over 180   fenofibrate micronized (LOFIBRA) 200 MG capsule Take 200 mg by mouth daily before breakfast.   hydrALAZINE (APRESOLINE) 50 MG tablet Take 2 tablets (100 mg total) by mouth 3 (three) times daily.   levothyroxine (SYNTHROID) 175 MCG tablet TAKE ONE TABLET BY MOUTH BEFORE BREAKFAST   losartan (COZAAR) 100 MG tablet Take 50 mg by mouth daily.    losartan (COZAAR) 50 MG tablet TAKE ONE TABLET BY MOUTH EVERY MORNING   nitroGLYCERIN (NITROSTAT) 0.4 MG SL tablet Place 1 tablet (0.4 mg total) under the tongue every 5 (five) minutes as needed for chest pain.   PARoxetine (PAXIL) 40 MG tablet 1.5 tablets     Allergies:   Oxycodone-acetaminophen,  Percocet [oxycodone-acetaminophen], Codeine, Other, Promethazine hcl, Clonidine derivatives, Hydralazine, Clonidine, and Metoprolol   Social History   Socioeconomic History   Marital status: Married    Spouse name: Not on file   Number of children: 2   Years of education: Not on file   Highest education level: Not on file  Occupational History   Occupation: Radiographer, therapeutic  Tobacco Use   Smoking status: Never   Smokeless tobacco: Never  Vaping Use   Vaping Use: Never used  Substance and Sexual Activity   Alcohol use: No    Alcohol/week: 0.0 standard drinks   Drug use: No   Sexual activity: Not on  file  Other Topics Concern   Not on file  Social History Narrative   Not on file   Social Determinants of Health   Financial Resource Strain: Not on file  Food Insecurity: Not on file  Transportation Needs: Not on file  Physical Activity: Not on file  Stress: Not on file  Social Connections: Not on file     Family History: The patient's family history includes Cancer in her sister; Stomach cancer in her mother.  ROS:   Please see the history of present illness.    ROS  All other systems reviewed and negative.   EKGs/Labs/Other Studies Reviewed:    The following studies were reviewed today:  2D echo 01/2020 IMPRESSIONS   1. Left ventricular ejection fraction, by estimation, is 60 to 65%. The  left ventricle has normal function. The left ventricle has no regional  wall motion abnormalities. Left ventricular diastolic parameters are  consistent with Grade II diastolic  dysfunction (pseudonormalization). Elevated left ventricular end-diastolic  pressure. The E/e' is 26.   2. Right ventricular systolic function is normal. The right ventricular  size is normal. There is mildly elevated pulmonary artery systolic  pressure. The estimated right ventricular systolic pressure is 44.0 mmHg.   3. Left atrial size was moderately dilated.   4. Right atrial size was  moderately dilated.   5. The mitral valve is normal in structure. Trivial mitral valve  regurgitation. No evidence of mitral stenosis.   6. The aortic valve is normal in structure. Aortic valve regurgitation is  not visualized. No aortic stenosis is present.   7. The inferior vena cava is normal in size with greater than 50%  respiratory variability, suggesting right atrial pressure of 3 mmHg.   Steffanie Dunn 01/2020 Study Highlights    Nuclear stress EF: 65%. Horizontal ST segment depression 70mm ST segment depression was noted during stress in the I and II leads. ST deviation beginning in recovery. Lead I has T wave inversions at baseline that persisted throughout. This is a low risk study. The left ventricular ejection fraction is normal (55-65%).   No ischemia or infarction on perfusion images. Normal wall motion.    EKG:  EKG is ordered today and showed sinus bradycardia at 57bpm  Recent Labs: 12/23/2019: B Natriuretic Peptide 362.4; BUN 37; Creatinine, Ser 1.57; Hemoglobin 11.5; Platelets 221; Potassium 3.8; Sodium 139 09/28/2020: TSH 1.14   Recent Lipid Panel    Component Value Date/Time   CHOL 152 10/02/2015 0901   TRIG 169 (H) 10/02/2015 0901   HDL 30 (L) 10/02/2015 0901   CHOLHDL 5.1 (H) 10/02/2015 0901   VLDL 34 (H) 10/02/2015 0901   LDLCALC 88 10/02/2015 0901    Physical Exam:    VS:  BP (!) 152/60   Pulse (!) 57   Ht 5' (1.524 m)   Wt 140 lb 3.2 oz (63.6 kg)   BMI 27.38 kg/m     Wt Readings from Last 3 Encounters:  11/20/20 140 lb 3.2 oz (63.6 kg)  09/28/20 143 lb 9.6 oz (65.1 kg)  05/23/20 166 lb (75.3 kg)     GEN: Well nourished, well developed in no acute distress HEENT: Normal NECK: No JVD; No carotid bruits LYMPHATICS: No lymphadenopathy CARDIAC:RRR, no murmurs, rubs, gallops RESPIRATORY:  Clear to auscultation without rales, wheezing or rhonchi  ABDOMEN: Soft, non-tender, non-distended MUSCULOSKELETAL:  No edema; No deformity  SKIN: Warm  and dry NEUROLOGIC:  Alert and oriented x 3 PSYCHIATRIC:  Normal affect  ASSESSMENT:    1. Essential hypertension   2. Chronic diastolic heart failure (HCC)   3. Junctional bradycardia    PLAN:    In order of problems listed above:  1.  HTN -BP again is borderline controlled on exam today  -she was taken off BB due to bradycardia -Continue prescription drug management with Losartan 50mg  daily and Hydralazine 100mg  TID>>refilled for 6 months -increase amlodipine to 10mg  daily  -will not increase Losartan due to CKD stage 3a -I have personally reviewed and interpreted outside labs performed by patient's PCP which showed SCr 1.25 and k+ 4 and TSH 1.14  -followup with PharmD in HTN clinic in 2 weeks  2.  Chronic diastolic CHF -she appears euvolemic on exam today -weight is down 25lbs from Jan due to her husbands recent death -currently she is not taking diuretic therapy  3.  Junctional Bradycardia -she denies any dizziness or syncope  -no further episodes off BB   Medication Adjustments/Labs and Tests Ordered: Current medicines are reviewed at length with the patient today.  Concerns regarding medicines are outlined above.  Orders Placed This Encounter  Procedures   EKG 12-Lead    No orders of the defined types were placed in this encounter.   Signed, , MD  11/20/2020 9:26 AM    [facility]

## 2020-11-22 ENCOUNTER — Telehealth: Payer: Self-pay | Admitting: Cardiology

## 2020-11-22 MED ORDER — AMLODIPINE BESYLATE 10 MG PO TABS: 10.0000 mg | ORAL_TABLET | Freq: Every day | ORAL | 3 refills | Status: DC

## 2020-11-22 NOTE — Telephone Encounter (Signed)
Pt's medication was sent to pt's pharmacy as requested. Confirmation received.  °

## 2020-11-22 NOTE — Telephone Encounter (Signed)
Pts Amlodipine was sent to the incorrect pharmacy, this should have been sent to [facility] - [place], [place] - [address] ... please advise

## 2020-12-06 ENCOUNTER — Ambulatory Visit: Payer: Medicare PPO | Admitting: Pharmacist

## 2020-12-06 NOTE — Progress Notes (Deleted)
Patient ID: Kimberly [name]                 DOB: 25-Jun-1940                      MRN: [id number]     HPI: Kimberly [name] is a 80 y.o. female referred by [name] to HTN clinic. PMH is significant for chronic diastolic CHF (EF 50-35% and grade II diastolic dysfunction on 01/2020 echo), HTN, CKD, diet controlled DM, OSA on CPAP, chronic LE edema, and depression. Underwent cath in 2010 due to chest pain which showed normal coronary arteries and elevated LVEDP felt to be due to diastolic CHF vs. microvascular angina. Has had reoccurrence of chest pressure since then, felt to be due to symptomatic junctional bradycardia. She has been seen multiple times by [name], PA and was still having issues with BP and compliance. It was felt she would benefit significantly from enrollment in a home-based primary care program to not only address the issue of transportation but also provide social support, medication reconciliation, access to home labwork, and close management of blood pressure. She was referred to [facility] for this. Last seen by Dr Mayford Knife on 11/20/20, BP was elevated at 152/60 and amlodipine was increased to 10mg  daily.  Gets confused easily with meds Difficult compliance Hard of hearing  Amlo dose was increased but pt has hx of LE edema on higher dose. Hydral sent in to take 2 50s TID instead of the 100s Dec amlo back if needed, change hydral to 100mg  tabs ( no headache or dizziness?) Change losartan to higher dose irbesartan, SCr 1.25 but stable, recheck labs in 2-3 weeks Otherwise add spiro Losartan was inc to 100 at 10/12/19 visit but scr inc from 1.07 to 1.57  Current HTN meds:  Amlodipine 10mg  daily Hydralazine 50mg  - 2 tabs TID Losartan 50mg  daily Clonidine 0.1mg  - prn if SBP > 180  Previously tried:  Hydralazine - headache and dizziness Amlodipine - LE edema on 10mg  Chlorthalidone 25mg  daily - rise in SCr Beta blocker - bradycardia Clonidine - bradycardia  BP goal:  <130/36mmHg  Family History: Cancer in her sister; Stomach cancer in her mother.  Social History: Denies alcohol, tobacco and drug use.  Diet:   Exercise:   Home BP readings:   Labs: 09/13/20: SCr 1.25, K 4, Na 136  Wt Readings from Last 3 Encounters:  11/20/20 140 lb 3.2 oz (63.6 kg)  09/28/20 143 lb 9.6 oz (65.1 kg)  05/23/20 166 lb (75.3 kg)   BP Readings from Last 3 Encounters:  11/20/20 (!) 152/60  09/28/20 (!) 150/78  05/23/20 (!) 152/58   Pulse Readings from Last 3 Encounters:  11/20/20 (!) 57  09/28/20 67  05/23/20 (!) 49    Renal function: CrCl cannot be calculated (Patient's most recent lab result is older than the maximum 21 days allowed.).  Past Medical History:  Diagnosis Date   Acid reflux    Anxiety    Arthritis    Back spasm    Bradycardia    a. junctional and sinus brady during admission 06/2017 requiring cessation of beta blocker and clonidine.   Chronic diastolic heart failure (HCC) 03/20/2015   Chronic edema    CKD (chronic kidney disease), stage III (HCC)    Depression    Diabetes mellitus without complication (HCC)    diet controled   Fuch's endothelial dystrophy    bilateral eyes   Headache  occ   HOH (hard of hearing)    wears hearing aid in left ear   Hypercholesteremia    Hypertensive heart disease with CHF (HCC) 09/11/2014   Insomnia    Junctional bradycardia    Mild mitral regurgitation    Mild tricuspid regurgitation    Normal coronary arteries 2010   a. 2010 cath with no obstructive epicardial disease.  Done for CP which was felt to be due to microvascular angina or diastolic HF.   OSA on CPAP    Followed by Dr. Fannie Knee   Patient's other noncompliance with medication regimen    per notes, gets confused easily with medications, difficult compliance     Current Outpatient Medications on File Prior to Visit  Medication Sig Dispense Refill   amLODipine (NORVASC) 10 MG tablet Take 1 tablet (10 mg total) by mouth daily. 90  tablet 3   aspirin 81 MG chewable tablet Chew 81 mg by mouth daily.     Cholecalciferol (VITAMIN D3) 5000 units CAPS Take 5,000 Units by mouth daily.     cloNIDine (CATAPRES) 0.1 MG tablet Take 0.1 mg by mouth every 6 (six) hours as needed. If BP is over 180     fenofibrate micronized (LOFIBRA) 200 MG capsule Take 200 mg by mouth daily before breakfast.     hydrALAZINE (APRESOLINE) 50 MG tablet Take 2 tablets (100 mg total) by mouth 3 (three) times daily. 540 tablet 3   levothyroxine (SYNTHROID) 175 MCG tablet TAKE ONE TABLET BY MOUTH BEFORE BREAKFAST     losartan (COZAAR) 50 MG tablet Take 1 tablet (50 mg total) by mouth every morning. 90 tablet 3   nitroGLYCERIN (NITROSTAT) 0.4 MG SL tablet Place 1 tablet (0.4 mg total) under the tongue every 5 (five) minutes as needed for chest pain. 25 tablet 6   PARoxetine (PAXIL) 40 MG tablet 1.5 tablets     No current facility-administered medications on file prior to visit.    Allergies  Allergen Reactions   Oxycodone-Acetaminophen Itching, Other (See Comments) and Shortness Of Breath    Pt couldn't breathe or catch her breath Pt couldn't breathe or catch her breath Pt couldn't breathe or catch her breath    Percocet [Oxycodone-Acetaminophen] Shortness Of Breath and Other (See Comments)    Pt couldn't breathe or catch her breath   Codeine Other (See Comments) and Itching    hyperactivity hyperactivity   Other Other (See Comments)    Some pain medication given at [facility] for surgery caused hallucinations    Promethazine Hcl Other (See Comments) and Nausea And Vomiting    Hallucinations  Hallucinations   Clonidine Derivatives     Bradycardia (06/2017)   Hydralazine Other (See Comments)    Headache/dizzy Headache/dizzy   Clonidine Palpitations    Bradycardia (06/2017)   Metoprolol Palpitations    Bradycardia 06/2017  Bradycardia 06/2017     Assessment/Plan:  1. Hypertension -

## 2021-02-13 DIAGNOSIS — G4733 Obstructive sleep apnea (adult) (pediatric): Secondary | ICD-10-CM | POA: Diagnosis not present

## 2021-03-15 DIAGNOSIS — H903 Sensorineural hearing loss, bilateral: Secondary | ICD-10-CM | POA: Diagnosis not present

## 2021-03-25 DIAGNOSIS — E119 Type 2 diabetes mellitus without complications: Secondary | ICD-10-CM | POA: Diagnosis not present

## 2021-03-25 DIAGNOSIS — E039 Hypothyroidism, unspecified: Secondary | ICD-10-CM | POA: Diagnosis not present

## 2021-03-25 DIAGNOSIS — I5032 Chronic diastolic (congestive) heart failure: Secondary | ICD-10-CM | POA: Diagnosis not present

## 2021-03-25 DIAGNOSIS — E7800 Pure hypercholesterolemia, unspecified: Secondary | ICD-10-CM | POA: Diagnosis not present

## 2021-03-25 DIAGNOSIS — F411 Generalized anxiety disorder: Secondary | ICD-10-CM | POA: Diagnosis not present

## 2021-03-25 DIAGNOSIS — I10 Essential (primary) hypertension: Secondary | ICD-10-CM | POA: Diagnosis not present

## 2021-03-25 DIAGNOSIS — Z23 Encounter for immunization: Secondary | ICD-10-CM | POA: Diagnosis not present

## 2021-05-14 ENCOUNTER — Encounter: Payer: Self-pay | Admitting: Internal Medicine

## 2021-05-14 ENCOUNTER — Ambulatory Visit: Payer: Medicare PPO | Admitting: Internal Medicine

## 2021-05-14 ENCOUNTER — Other Ambulatory Visit: Payer: Self-pay

## 2021-05-14 VITALS — BP 150/60 | HR 70 | Ht 60.0 in | Wt 139.2 lb

## 2021-05-14 DIAGNOSIS — E890 Postprocedural hypothyroidism: Secondary | ICD-10-CM | POA: Diagnosis not present

## 2021-05-14 DIAGNOSIS — E559 Vitamin D deficiency, unspecified: Secondary | ICD-10-CM

## 2021-05-14 NOTE — Patient Instructions (Signed)
°  Please continue levothyroxine 175 mcg daily.  Take the thyroid hormone every day, with water, at least 30 minutes before breakfast, separated by at least 4 hours from: - acid reflux medications - calcium - iron - multivitamins  Please continue vitamin D 5000 units daily.  Please stop at the lab.  Please come back for a follow-up appointment in 1 year.

## 2021-05-14 NOTE — Progress Notes (Signed)
Patient ID: Kimberly [name], female   DOB: 1941/01/07, 81 y.o.   MRN: [id number]  This visit occurred during the SARS-CoV-2 public health emergency.  Safety protocols were in place, including screening questions prior to the visit, additional usage of staff PPE, and extensive cleaning of exam room while observing appropriate contact time as indicated for disinfecting solutions.   HPI  Kimberly [name] is a 81 y.o.-year-old female, returning for f/u for hypercalcemia, dx 2012, postsurgical hypothyroidism. Last visit was 7 months ago.  She is limited in coming for the appointments by the need for transportation.  Her daughter brought her today, and she is almost an hour late...  Interim history: In 07/2019, she was admitted with encephalopathy either related to UTI or hypertensive.  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  He then developed dementia and follow-up feeding problems and died 2 months prior to our last visit.  She was very affected by this, as they were married for 61 years.  She lost more than 20 pounds before last visit.  Since then, she only lost 1 pound.  She feels much better.  Postsurgical hypothyroidism: - She had thyroidectomy for substernal goiter in 05/2015: Benign nodule pathology  Reviewed history: She had problems with compliance with her levothyroxine in the past.  In 06/2017 he was hospitalized and was found to have a junctional bradycardia with a TSH of 42.  At that time, daughter reported that she was not taking the levothyroxine consistently.  Afterwards, she started to put the medication bottle on her nightstand and was consistently taking it daily.  However, she then had a TSH level that was high, at 15.  TSH remains high so we increased the dose to 150 mcg daily in 07/2019.  At last visit, she was supposed to be on levothyroxine 150 mcg daily (increased 07/2019), but [facility]  gave her 175 mcg per review of her pill pack... However, since her TFTs were normal then, we continued the same dose.  She takes this: - in am - fasting - at least 30 min from b'fast - no calcium  - no iron - no multivitamins - no PPIs - not on Biotin  Reviewed her TFTs: Lab Results  Component Value Date   TSH 1.14 09/28/2020   TSH 1.21 03/29/2020   TSH 3.96 09/22/2019   TSH 5.89 (H) 07/20/2019   TSH 6.11 (H) 04/13/2019   TSH 15.700 (H) 02/21/2019   TSH 3.93 06/14/2018   TSH 1.74 09/24/2017   TSH 16.43 (H) 06/25/2017   TSH 42.727 (H) 06/12/2017   FREET4 1.38 09/28/2020   FREET4 1.53 03/29/2020   FREET4 1.25 09/22/2019   FREET4 1.18 07/20/2019   FREET4 1.23 04/13/2019   FREET4 0.98 06/14/2018   FREET4 1.19 09/24/2017   FREET4 1.06 06/25/2017   FREET4 0.84 01/16/2016   FREET4 0.83 09/18/2015  07/31/2015: TSH 58  Surprisingly, her calcium has improved after parathyroid surgery in 2016  Primary hyperparathyroidism: -Greatly improved from 2016  Reviewed recent labs: 03/25/2021: Corrected calcium 9.9 Lab Results  Component Value Date   PTH 19 09/18/2015   PTH Comment 09/18/2015   PTH 21 02/08/2015   PTH Comment 02/08/2015   PTH 17 12/09/2013   PTH Comment 12/09/2013   PTH 14.9 08/24/2012   CALCIUM 9.9 12/23/2019   CALCIUM 10.0 10/20/2019   CALCIUM 9.7 10/12/2019   CALCIUM 9.2 05/11/2019   CALCIUM  9.9 05/10/2019   CALCIUM 10.0 02/21/2019   CALCIUM 10.2 07/05/2018   CALCIUM 9.9 06/16/2018   CALCIUM 10.2 06/14/2018   CALCIUM 9.7 06/13/2017  12/25/2014: Calcium 10.8 (8.6-10.3) 09/12/2013: Ca 10.8, iCa 5.7 (4.5-5.6)  Has a history of vitamin D deficiency: Lab Results  Component Value Date   VD25OH 27.9 (L) 09/28/2020   VD25OH 34.0 03/29/2020   VD25OH 34.49 07/20/2019   VD25OH 18.27 (L) 04/13/2019   VD25OH 57.47 06/14/2018   VD25OH 61.83 09/24/2017   VD25OH 32.67 09/18/2015   VD25OH 28.65 (L) 02/08/2015   VD25OH 50.59 12/09/2013   She continues on  vitamin D 5000 units daily.  Reviewed other pertinent labs: Component     Latest Ref Rng 02/08/2015 02/08/2015         2:14 PM  2:14 PM  Vitamin A (Retinoic Acid)     38 - 98 mcg/dL 89    Component     Latest Ref Rng 12/09/2013  Vitamin D 1, 25 (OH) Total     18 - 72 pg/mL 79 (H)  Vitamin D3 1, 25 (OH)      79  Vitamin D2 1, 25 (OH)      <8  PTH-Related Protein (PTH-RP)     14 - 27 pg/mL 16  Phosphorus     2.3 - 4.6 mg/dL 3.2   Her urine calcium was elevated: Component     Latest Ref Rng 01/20/2014  Calcium, Ur      14  Calcium, 24 hour urine     100 - 250 mg/day 322 (H)  Creatinine, Urine      51.9  Creatinine, 24H Ur     700 - 1800 mg/day 1194   No history of kidney stones. No history of osteoporosis.  No fractures.  Reviewed her DXA scan report from 2008:  [place] (L1-L3)  BMD: 1.273  T-score (% of young adult value): 2.3  Z-score (% adult age match value): 4.1  LEFT HIP (NECK)  BMD: 0.831  T-score (% of young adult value): -0.2  Z-score (% adult age match value): 1.4  Assessment: This patient is considered normal according to [facility]) criteria.   She has CKD (Dr. Moshe Cipro).  Latest BUN/creatinine reviewed: Lab Results  Component Value Date   BUN 37 (H) 12/23/2019   CREATININE 1.57 (H) 12/23/2019    She has had a technetium sestamibi scan (04/2015) that was negative for a parathyroid adenoma e and a CT scan of her neck (04/2015) there was also negative for possible parathyroid nodule nodule but this showed a substernal goiter with tracheal narrowing.  She also has a history of DM2 (managed by PCP) - HbA1c 4.9% on 03/25/2021, HTN, HL.   ROS: Constitutional: + weight loss, + fatigue  I reviewed pt's medications, allergies, PMH, social hx, family hx, and changes were documented in the history of present illness. Otherwise, unchanged from my initial visit note.  Past Medical History:  Diagnosis Date   Acid reflux     Anxiety    Arthritis    Back spasm    Bradycardia    a. junctional and sinus brady during admission 06/2017 requiring cessation of beta blocker and clonidine.   Chronic diastolic heart failure ([place]) 03/20/2015   Chronic edema    CKD (chronic kidney disease), stage III (HCC)    Depression    Diabetes mellitus without complication (HCC)    diet controled   Fuch's endothelial dystrophy    bilateral eyes  Headache    occ   HOH (hard of hearing)    wears hearing aid in left ear   Hypercholesteremia    Hypertensive heart disease with CHF ([place]) 09/11/2014   Insomnia    Junctional bradycardia    Mild mitral regurgitation    Mild tricuspid regurgitation    Normal coronary arteries 2010   a. 2010 cath with no obstructive epicardial disease.  Done for CP which was felt to be due to microvascular angina or diastolic HF.   OSA on CPAP    Followed by Dr. Keturah Barre   Patient's other noncompliance with medication regimen    per notes, gets confused easily with medications, difficult compliance    Past Surgical History:  Procedure Laterality Date   ABDOMINAL HYSTERECTOMY     APPENDECTOMY     CARDIAC CATHETERIZATION     no CAD   CESAREAN SECTION     x 2   CHOLECYSTECTOMY     COLONOSCOPY     EYE SURGERY Right    implant   ROTATOR CUFF REPAIR Left    SHOULDER ARTHROSCOPY WITH SUBACROMIAL DECOMPRESSION AND BICEP TENDON REPAIR Left 08/24/2012   Procedure: SHOULDER ARTHROSCOPY WITH SUBACROMIAL DECOMPRESSION AND BICEP TENDON REPAIR;  Surgeon: Meredith Pel, MD;  Location: [place];  Service: Orthopedics;  Laterality: Left;  Left Shoulder Arthroscopy, Debridement, Biceps Tenotomy   THYROIDECTOMY N/A 06/12/2015   Procedure: TOTAL THYROIDECTOMY;  Surgeon: Armandina Gemma, MD;  Location: [place];  Service: General;  Laterality: N/A;   History   Social History   Marital Status: Married    Spouse Name: N/A    Number of Children: 2   Occupational History   Secondary school teacher    Social  History Main Topics   Smoking status: Never Smoker    Smokeless tobacco: Not on file   Alcohol Use: No   Drug Use: No   Current Outpatient Medications on File Prior to Visit  Medication Sig Dispense Refill   amLODipine (NORVASC) 10 MG tablet Take 1 tablet (10 mg total) by mouth daily. 90 tablet 3   aspirin 81 MG chewable tablet Chew 81 mg by mouth daily.     Cholecalciferol (VITAMIN D3) 5000 units CAPS Take 5,000 Units by mouth daily.     cloNIDine (CATAPRES) 0.1 MG tablet Take 0.1 mg by mouth every 6 (six) hours as needed. If BP is over 180     fenofibrate micronized (LOFIBRA) 200 MG capsule Take 200 mg by mouth daily before breakfast.     hydrALAZINE (APRESOLINE) 50 MG tablet Take 2 tablets (100 mg total) by mouth 3 (three) times daily. 540 tablet 3   levothyroxine (SYNTHROID) 175 MCG tablet TAKE ONE TABLET BY MOUTH BEFORE BREAKFAST     losartan (COZAAR) 50 MG tablet Take 1 tablet (50 mg total) by mouth every morning. 90 tablet 3   nitroGLYCERIN (NITROSTAT) 0.4 MG SL tablet Place 1 tablet (0.4 mg total) under the tongue every 5 (five) minutes as needed for chest pain. 25 tablet 6   PARoxetine (PAXIL) 40 MG tablet 1.5 tablets     No current facility-administered medications on file prior to visit.   Allergies  Allergen Reactions   Oxycodone-Acetaminophen Itching, Other (See Comments) and Shortness Of Breath    Pt couldn't breathe or catch her breath Pt couldn't breathe or catch her breath Pt couldn't breathe or catch her breath    Percocet [Oxycodone-Acetaminophen] Shortness Of Breath and Other (See Comments)    Pt couldn't breathe  or catch her breath   Codeine Other (See Comments) and Itching    hyperactivity hyperactivity   Other Other (See Comments)    Some pain medication given at [facility] for surgery caused hallucinations    Promethazine Hcl Other (See Comments) and Nausea And Vomiting    Hallucinations  Hallucinations   Clonidine Derivatives     Bradycardia  (06/2017)   Hydralazine Other (See Comments)    Headache/dizzy Headache/dizzy   Clonidine Palpitations    Bradycardia (06/2017)   Metoprolol Palpitations    Bradycardia 06/2017  Bradycardia 06/2017   Family History  Problem Relation Age of Onset   Stomach cancer Mother    Cancer Sister        intestinal   PE: BP (!) 150/60 (BP Location: Left Arm, Patient Position: Sitting, Cuff Size: Normal)    Pulse 70    Ht 5' (1.524 m)    Wt 139 lb 3.2 oz (63.1 kg)    SpO2 95%    BMI 27.19 kg/m   Wt Readings from Last 3 Encounters:  05/14/21 139 lb 3.2 oz (63.1 kg)  11/20/20 140 lb 3.2 oz (63.6 kg)  09/28/20 143 lb 9.6 oz (65.1 kg)   Constitutional: overweight, in NAD Eyes: PERRLA, EOMI, no exophthalmos ENT: moist mucous membranes, no thyromegaly, no cervical lymphadenopathy Cardiovascular: RRR, No RG, + 2/6 SEM, + B LE pitting edema Respiratory: CTA B Musculoskeletal: no deformities, strength intact in all 4 Skin: moist, warm, no rashes Neurological: no tremor with outstretched hands, DTR normal in all 4  Assessment: 1. Postsurgical hypothyroidism  2. H/o Hypercalcemia - ? primary hyperparathyroidism  3.  Vitamin D Insufficiency  Plan: 1. Postsurgical hypothyroidism -She had a high TSH in 02/2019: 15.7.  At that time she was not taking the levothyroxine correctly and missing doses.  She was taking calcium and multivitamins close to levothyroxine and we moved them later in the day.  However, TSH returned high when it was checked during her admission for encephalopathy in 07/2019.  We increased her levothyroxine dose at that time. - latest thyroid labs reviewed with pt. >> normal: Lab Results  Component Value Date   TSH 1.14 09/28/2020  - she continues on LT4 175 mcg daily - pt feels good on this dose.  However, at last visit, she lost more than 25 pounds in 6 months, possibly because of grief.  Weight stabilized since then and she is feeling better. - we discussed about taking the  thyroid hormone every day, with water, >30 minutes before breakfast, separated by >4 hours from acid reflux medications, calcium, iron, multivitamins. Pt. is taking it correctly. - will check thyroid tests today: TSH and fT4 - If labs are abnormal, she will need to return for repeat TFTs in 1.5 months - OTW, I will see her back in 1 year  2. Hypercalcemia -Interestingly, her calcium normalized after her thyroid surgery in 2016 -Reviewed previous investigation: Patient has had several instances of elevated calcium, with the highest being at 11.1.  Of note, her PTH levels were repeatedly checked and they were normal.  A PTHrp level was normal, multiple myeloma work-up was negative, vitamin A level was normal, vitamin D was normal or minimally low, she had a high 1, 25 dihydroxy vitamin D with a normal phosphorus.  Her urinary calcium was high in the past.  A technetium sestamibi scan, thyroid ultrasound, and neck CT did not show any parathyroid masses. -She has no apparent complications from hypercalcemia: No history  of nephrolithiasis, abdominal pain -Latest calcium level was normal on 03/25/2021 (9.9 corrected per review of outside records) -No further investigation is needed for this  2.  Vitamin D insufficiency -She takes 5000 units vitamin D daily -At last visit, vitamin D level was slightly low, 27.9.  I advised her to take the doses consistently. -We will recheck the level today.  Needs refills - [facility].  Component     Latest Ref Rng & Units 05/14/2021  TSH     0.35 - 5.50 uIU/mL 0.10 (L)  T4,Free(Direct)     0.60 - 1.60 ng/dL 1.52  VITD     30.00 - 100.00 ng/mL 47.58  TSH suppressed, but due to the significant weight loss.  I would advise her to decrease the dose of levothyroxine to 150 mcg daily and recheck her tests in 1.5 months. Vitamin D level is normal.  Philemon Kingdom, MD PhD [facility] Endocrinology

## 2021-05-15 ENCOUNTER — Telehealth: Payer: Self-pay

## 2021-05-15 LAB — TSH: TSH: 0.1 u[IU]/mL — ABNORMAL LOW (ref 0.35–5.50)

## 2021-05-15 LAB — T4, FREE: Free T4: 1.52 ng/dL (ref 0.60–1.60)

## 2021-05-15 LAB — VITAMIN D 25 HYDROXY (VIT D DEFICIENCY, FRACTURES): VITD: 47.58 ng/mL (ref 30.00–100.00)

## 2021-05-15 MED ORDER — LEVOTHYROXINE SODIUM 150 MCG PO TABS: 150.0000 ug | ORAL_TABLET | Freq: Every day | ORAL | 5 refills | Status: DC

## 2021-05-15 NOTE — Telephone Encounter (Addendum)
Called and left a message for pt to call back to discuss results. Lab appt needed for 6 weeks.  ----- Message from Carlus Pavlov, MD sent at 05/15/2021 12:38 PM EST ----- Can you please call pt.:  TSH is too low, so we need to decrease the dose of levothyroxine to 150 mcg daily (sent) and recheck her tests in 1.5 months (labs in). Vitamin D level is normal.

## 2021-05-16 NOTE — Telephone Encounter (Signed)
Called and spoke with pt and granddaughter regarding labs. Follow up lab appt scheduled.

## 2021-05-22 ENCOUNTER — Other Ambulatory Visit: Payer: Self-pay | Admitting: Cardiology

## 2021-06-04 ENCOUNTER — Ambulatory Visit: Payer: Medicare PPO | Admitting: Cardiology

## 2021-06-24 ENCOUNTER — Other Ambulatory Visit: Payer: Medicare PPO

## 2021-06-25 ENCOUNTER — Other Ambulatory Visit: Payer: Self-pay

## 2021-06-25 ENCOUNTER — Encounter: Payer: Self-pay | Admitting: Cardiology

## 2021-06-25 ENCOUNTER — Ambulatory Visit: Payer: Medicare PPO | Admitting: Cardiology

## 2021-06-25 VITALS — BP 134/52 | HR 69 | Ht 60.0 in | Wt 136.8 lb

## 2021-06-25 DIAGNOSIS — R0989 Other specified symptoms and signs involving the circulatory and respiratory systems: Secondary | ICD-10-CM | POA: Diagnosis not present

## 2021-06-25 DIAGNOSIS — I10 Essential (primary) hypertension: Secondary | ICD-10-CM

## 2021-06-25 DIAGNOSIS — R001 Bradycardia, unspecified: Secondary | ICD-10-CM

## 2021-06-25 DIAGNOSIS — I5032 Chronic diastolic (congestive) heart failure: Secondary | ICD-10-CM

## 2021-06-25 NOTE — Progress Notes (Signed)
Cardiology Office Note:    Date:  06/25/2021   ID:  Kimberly [name], DOB 04/23/1941, MRN [id number]  PCP:  Aretta Nip, MD  Cardiologist:  Fransico Him, MD    Referring MD: Aretta Nip, MD   Chief Complaint  Patient presents with   Hypertension   Congestive Heart Failure     History of Present Illness:    Kimberly [name] is a 81 y.o. female with a hx of chronic diastolic CHF, chronic lower extremity edema and hypertension.  She had a cath in 2010 due to CP showing normal coronary arteries with elevated LVEDP  felt to be due to diastolic CHF vs. Microvascular angina. Her CP resolved for a while but reoccurred and nuclear stress test 09/2014 was normal.  2D echocardiogram 09/2014 showed normal LV function with EF 55 to [id number] and diastolic dysfunction.  She was admitted February 2019 with junctional bradycardia, chest pain and shortness of breath.  Her heart rate was in the 40s.  Chest pressure was felt to be due to symptomatic junctional bradycardia.  Her clonidine and metoprolol were stopped.  2D echocardiogram at that time showed EF 55 to 60%.  Amlodipine was added for blood pressure control.  She was seen back in the office on 06/23/2017 her heart rate was 68 bpm.  Her weakness resolved.  She was trialed on hydralazine in the past but developed headache/dizziness after taking hydralazine so this was stopped. She was restarted on lower dose of amlodipine with titration of losartan to 50mg  daily. She has been on varying doses of amlodipine over the years as well as lisinopril, HCTZ, and chlorthalidone at one point. Amlodipine had to be reduced to 5mg  daily due to lower extremity edema. She had rise in creatinine with chlorthalidone 25mg  daily so this was discontinued.  She was seen 02/2019 by Melina Copa, PA for followup and had a lot of psychosocial issues.  Her BP was poorly controlled at 190/58mmHg and her Losartan was increased to 100mg  daily.  Amlodipine was not titrated due to  problems with LE edema in the past.  It was felt that uncontrolled depression and anxiety could also be playing a role in her BP elevations.  She was also felt to have depression and was referred back to her PCP to address her depression and antihypertensive meds so there were not multiple MDs making changes to her meds and causing confusion.   She is here today for followup and is doing well.  She denies any chest pain or pressure, SOB, DOE, PND, orthopnea, LE edema, dizziness, palpitations or syncope.  She is compliant with her meds and is tolerating meds with no SE.      Past Medical History:  Diagnosis Date   Acid reflux    Anxiety    Arthritis    Back spasm    Bradycardia    a. junctional and sinus brady during admission 06/2017 requiring cessation of beta blocker and clonidine.   Chronic diastolic heart failure ([place]) 03/20/2015   Chronic edema    CKD (chronic kidney disease), stage III (HCC)    Depression    Diabetes mellitus without complication (HCC)    diet controled   Fuch's endothelial dystrophy    bilateral eyes   Headache    occ   HOH (hard of hearing)    wears hearing aid in left ear   Hypercholesteremia    Hypertensive heart disease with CHF ([place]) 09/11/2014   Insomnia    Junctional  bradycardia    Mild mitral regurgitation    Mild tricuspid regurgitation    Normal coronary arteries 2010   a. 2010 cath with no obstructive epicardial disease.  Done for CP which was felt to be due to microvascular angina or diastolic HF.   OSA on CPAP    Followed by Dr. Keturah Barre   Patient's other noncompliance with medication regimen    per notes, gets confused easily with medications, difficult compliance     Past Surgical History:  Procedure Laterality Date   ABDOMINAL HYSTERECTOMY     APPENDECTOMY     CARDIAC CATHETERIZATION     no CAD   CESAREAN SECTION     x 2   CHOLECYSTECTOMY     COLONOSCOPY     EYE SURGERY Right    implant   ROTATOR CUFF REPAIR Left    SHOULDER  ARTHROSCOPY WITH SUBACROMIAL DECOMPRESSION AND BICEP TENDON REPAIR Left 08/24/2012   Procedure: SHOULDER ARTHROSCOPY WITH SUBACROMIAL DECOMPRESSION AND BICEP TENDON REPAIR;  Surgeon: Meredith Pel, MD;  Location: [facility];  Service: Orthopedics;  Laterality: Left;  Left Shoulder Arthroscopy, Debridement, Biceps Tenotomy   THYROIDECTOMY N/A 06/12/2015   Procedure: TOTAL THYROIDECTOMY;  Surgeon: Armandina Gemma, MD;  Location: [facility];  Service: General;  Laterality: N/A;    Current Medications: Current Meds  Medication Sig   amLODipine (NORVASC) 10 MG tablet Take 1 tablet (10 mg total) by mouth daily.   aspirin 81 MG chewable tablet Chew 81 mg by mouth daily.   Cholecalciferol (VITAMIN D3) 5000 units CAPS Take 5,000 Units by mouth daily.   cloNIDine (CATAPRES) 0.1 MG tablet Take 0.1 mg by mouth every 6 (six) hours as needed. If BP is over 180   fenofibrate micronized (LOFIBRA) 200 MG capsule Take 200 mg by mouth daily before breakfast.   hydrALAZINE (APRESOLINE) 50 MG tablet TAKE TWO TABLETS BY MOUTH THREE TIMES DAILY   levothyroxine (SYNTHROID) 150 MCG tablet Take 1 tablet (150 mcg total) by mouth daily.   losartan (COZAAR) 50 MG tablet TAKE ONE TABLET BY MOUTH EVERY MORNING   nitroGLYCERIN (NITROSTAT) 0.4 MG SL tablet Place 1 tablet (0.4 mg total) under the tongue every 5 (five) minutes as needed for chest pain.   PARoxetine (PAXIL) 40 MG tablet 1.5 tablets     Allergies:   Oxycodone-acetaminophen, Percocet [oxycodone-acetaminophen], Codeine, Other, Promethazine hcl, Clonidine derivatives, Hydralazine, Clonidine, and Metoprolol   Social History   Socioeconomic History   Marital status: Married    Spouse name: Not on file   Number of children: 2   Years of education: Not on file   Highest education level: Not on file  Occupational History   Occupation: Secondary school teacher  Tobacco Use   Smoking status: Never   Smokeless tobacco: Never  Vaping Use   Vaping Use: Never used   Substance and Sexual Activity   Alcohol use: No    Alcohol/week: 0.0 standard drinks   Drug use: No   Sexual activity: Not on file  Other Topics Concern   Not on file  Social History Narrative   Not on file   Social Determinants of Health   Financial Resource Strain: Not on file  Food Insecurity: Not on file  Transportation Needs: Not on file  Physical Activity: Not on file  Stress: Not on file  Social Connections: Not on file     Family History: The patient's family history includes Cancer in her sister; Stomach cancer in her mother.  ROS:  Please see the history of present illness.    ROS  All other systems reviewed and negative.   EKGs/Labs/Other Studies Reviewed:    The following studies were reviewed today:  2D echo 01/2020 IMPRESSIONS   1. Left ventricular ejection fraction, by estimation, is 60 to 65%. The  left ventricle has normal function. The left ventricle has no regional  wall motion abnormalities. Left ventricular diastolic parameters are  consistent with Grade II diastolic  dysfunction (pseudonormalization). Elevated left ventricular end-diastolic  pressure. The E/e' is 36.   2. Right ventricular systolic function is normal. The right ventricular  size is normal. There is mildly elevated pulmonary artery systolic  pressure. The estimated right ventricular systolic pressure is 123XX123 mmHg.   3. Left atrial size was moderately dilated.   4. Right atrial size was moderately dilated.   5. The mitral valve is normal in structure. Trivial mitral valve  regurgitation. No evidence of mitral stenosis.   6. The aortic valve is normal in structure. Aortic valve regurgitation is  not visualized. No aortic stenosis is present.   7. The inferior vena cava is normal in size with greater than 50%  respiratory variability, suggesting right atrial pressure of 3 mmHg.   Leane Call 01/2020 Study Highlights    Nuclear stress EF: 65%. Horizontal ST segment  depression 36mm ST segment depression was noted during stress in the I and II leads. ST deviation beginning in recovery. Lead I has T wave inversions at baseline that persisted throughout. This is a low risk study. The left ventricular ejection fraction is normal (55-65%).   No ischemia or infarction on perfusion images. Normal wall motion.    EKG:  EKG is ordered today and showed sinus bradycardia at 57bpm  Recent Labs: 05/14/2021: TSH 0.10   Recent Lipid Panel    Component Value Date/Time   CHOL 152 10/02/2015 0901   TRIG 169 (H) 10/02/2015 0901   HDL 30 (L) 10/02/2015 0901   CHOLHDL 5.1 (H) 10/02/2015 0901   VLDL 34 (H) 10/02/2015 0901   LDLCALC 88 10/02/2015 0901    Physical Exam:    VS:  BP (!) 134/52    Pulse 69    Ht 5' (1.524 m)    Wt 136 lb 12.8 oz (62.1 kg)    SpO2 93%    BMI 26.72 kg/m     Wt Readings from Last 3 Encounters:  06/25/21 136 lb 12.8 oz (62.1 kg)  05/14/21 139 lb 3.2 oz (63.1 kg)  11/20/20 140 lb 3.2 oz (63.6 kg)     GEN: Well nourished, well developed in no acute distress HEENT: Normal NECK: No JVD; left carotid bruit LYMPHATICS: No lymphadenopathy CARDIAC:RRR, no murmurs, rubs, gallops RESPIRATORY:  Clear to auscultation without rales, wheezing or rhonchi  ABDOMEN: Soft, non-tender, non-distended MUSCULOSKELETAL:  No edema; No deformity  SKIN: Warm and dry NEUROLOGIC:  Alert and oriented x 3 PSYCHIATRIC:  Normal affect   ASSESSMENT:    1. Essential hypertension   2. Chronic diastolic heart failure (HCC)   3. Junctional bradycardia   4. Left carotid bruit    PLAN:    In order of problems listed above:  1.  HTN -she was taken off BB due to bradycardia -BP is controlled on exam today -Continue prescription drug management with amlodipine 10 mg daily, losartan 50 mg daily and hydralazine 100 mg 3 times daily with as needed refills -I have personally reviewed and interpreted outside labs performed by patient's PCP which showed  serum  creatinine 1.23, potassium 4.5 03/25/2021 and TSH 0.1 on 1/23  2.  Chronic diastolic CHF -She does not appear volume overloaded on exam today -She has not required any diuretic therapy recently  3.  Junctional Bradycardia -She has not had any fatigue, dizziness or syncope -no further episodes off BB  4.  Left carotid bruit -I will check carotid dopplers  Medication Adjustments/Labs and Tests Ordered: Current medicines are reviewed at length with the patient today.  Concerns regarding medicines are outlined above.  No orders of the defined types were placed in this encounter.   No orders of the defined types were placed in this encounter.    Signed, Fransico Him, MD  06/25/2021 3:51 PM    [place]

## 2021-06-25 NOTE — Patient Instructions (Signed)
Medication Instructions:  Your physician recommends that you continue on your current medications as directed. Please refer to the Current Medication list given to you today. *If you need a refill on your cardiac medications before your next appointment, please call your pharmacy*   Lab Work: NONE ORDERED  Testing/Procedures: Your physician has requested that you have a carotid duplex. This test is an ultrasound of the carotid arteries in your neck. It looks at blood flow through these arteries that supply the brain with blood. Allow one hour for this exam. There are no restrictions or special instructions. THIS TEST WILL BE PERFORMED AT OUR [place] LOC[facility].  THAT ADDRESS IS: [address]  Follow-Up: At [facility], you and your health needs are our priority.  As part of our continuing mission to provide you with exceptional heart care, we have created designated Provider Care Teams.  These Care Teams include your primary Cardiologist (physician) and Advanced Practice Providers (APPs -  Physician Assistants and Nurse Practitioners) who all work together to provide you with the care you need, when you need it.  We recommend signing up for the patient portal called "MyChart".  Sign up information is provided on this After Visit Summary.  MyChart is used to connect with patients for Virtual Visits (Telemedicine).  Patients are able to view lab/test results, encounter notes, upcoming appointments, etc.  Non-urgent messages can be sent to your provider as well.   To learn more about what you can do with MyChart, go to [email or web address].    Your next appointment:   1 year(s)  The format for your next appointment:   In Person  Provider:   Armanda Magic, MD   Other Instructions

## 2021-06-25 NOTE — Addendum Note (Signed)
Addended by: Kandice Robinsons T on: 06/25/2021 03:57 PM   Modules accepted: Orders

## 2021-06-27 ENCOUNTER — Other Ambulatory Visit (INDEPENDENT_AMBULATORY_CARE_PROVIDER_SITE_OTHER): Payer: Medicare PPO

## 2021-06-27 ENCOUNTER — Other Ambulatory Visit: Payer: Self-pay

## 2021-06-27 DIAGNOSIS — E890 Postprocedural hypothyroidism: Secondary | ICD-10-CM | POA: Diagnosis not present

## 2021-06-27 LAB — T4, FREE: Free T4: 2.19 ng/dL — ABNORMAL HIGH (ref 0.60–1.60)

## 2021-06-27 LAB — TSH: TSH: 0.05 u[IU]/mL — ABNORMAL LOW (ref 0.35–5.50)

## 2021-07-01 ENCOUNTER — Telehealth: Payer: Self-pay

## 2021-07-01 DIAGNOSIS — E890 Postprocedural hypothyroidism: Secondary | ICD-10-CM

## 2021-07-01 NOTE — Telephone Encounter (Addendum)
Called and left a message for pt to call back to discuss results. ----- Message from Carlus Pavlov, MD sent at 06/28/2021  2:09 PM EST ----- Can you please call pt.:  Her TSH is still low.  We need to decrease the dose of levothyroxine to 137 mcg daily and recheck the tests In 1.5 months.  Can you please send this to her pharmacy - 45 tablets with 5 refills.  Labs are in.

## 2021-07-02 MED ORDER — LEVOTHYROXINE SODIUM 137 MCG PO TABS: 137.0000 ug | ORAL_TABLET | Freq: Every day | ORAL | 5 refills | Status: DC

## 2021-07-02 NOTE — Telephone Encounter (Addendum)
3rd vm left for pt to call back to discuss results. Letter generated for pt with results and provider instruction.

## 2021-07-08 DIAGNOSIS — B029 Zoster without complications: Secondary | ICD-10-CM | POA: Diagnosis not present

## 2021-07-15 ENCOUNTER — Ambulatory Visit (HOSPITAL_COMMUNITY)
Admission: RE | Admit: 2021-07-15 | Discharge: 2021-07-15 | Disposition: A | Discharge status: Home / Self Care | Payer: Medicare PPO | Source: Ambulatory Visit | Attending: Cardiology | Admitting: Cardiology

## 2021-07-15 ENCOUNTER — Other Ambulatory Visit: Payer: Self-pay

## 2021-07-15 DIAGNOSIS — R0989 Other specified symptoms and signs involving the circulatory and respiratory systems: Secondary | ICD-10-CM | POA: Insufficient documentation

## 2021-07-16 ENCOUNTER — Encounter: Payer: Self-pay | Admitting: Cardiology

## 2021-07-17 ENCOUNTER — Telehealth: Payer: Self-pay | Admitting: Cardiology

## 2021-07-17 DIAGNOSIS — I6523 Occlusion and stenosis of bilateral carotid arteries: Secondary | ICD-10-CM

## 2021-07-17 NOTE — Telephone Encounter (Signed)
The patient has been notified of the result and verbalized understanding.  All questions (if any) were answered. ?Theresia Majors, RN 07/17/2021 2:22 PM  ?Lab work has been scheduled.  ?

## 2021-07-17 NOTE — Telephone Encounter (Signed)
Calling back for results. Please advise 

## 2021-07-17 NOTE — Telephone Encounter (Signed)
-----   Message from Quintella Reichert, MD sent at 07/16/2021  8:36 AM EST ----- ?Please have her come in for a fasting lipid panel and ALT ?

## 2021-07-18 NOTE — Telephone Encounter (Signed)
Pt called and lvm to schedule follow up lab appt. ?

## 2021-07-18 NOTE — Telephone Encounter (Signed)
Returned pt's call and lvm for pt to call back to schedule lab appt. ?

## 2021-07-24 ENCOUNTER — Other Ambulatory Visit: Payer: Medicare PPO

## 2021-07-24 DIAGNOSIS — B029 Zoster without complications: Secondary | ICD-10-CM | POA: Diagnosis not present

## 2021-07-31 ENCOUNTER — Other Ambulatory Visit: Payer: Medicare PPO | Admitting: *Deleted

## 2021-07-31 ENCOUNTER — Other Ambulatory Visit: Payer: Self-pay | Admitting: Cardiology

## 2021-07-31 ENCOUNTER — Other Ambulatory Visit: Payer: Self-pay

## 2021-07-31 DIAGNOSIS — I6523 Occlusion and stenosis of bilateral carotid arteries: Secondary | ICD-10-CM

## 2021-07-31 LAB — LIPID PANEL
Chol/HDL Ratio: 2.9 ratio (ref 0.0–4.4)
Cholesterol, Total: 188 mg/dL (ref 100–199)
HDL: 64 mg/dL (ref >39)
LDL Chol Calc (NIH): 108 mg/dL — ABNORMAL HIGH (ref 0–99)
Triglycerides: 91 mg/dL (ref 0–149)
VLDL Cholesterol Cal: 16 mg/dL (ref 5–40)

## 2021-07-31 LAB — ALT: ALT: 11 IU/L (ref 0–32)

## 2021-08-06 ENCOUNTER — Telehealth: Payer: Self-pay

## 2021-08-06 DIAGNOSIS — I6523 Occlusion and stenosis of bilateral carotid arteries: Secondary | ICD-10-CM

## 2021-08-06 MED ORDER — ATORVASTATIN CALCIUM 20 MG PO TABS: 20.0000 mg | ORAL_TABLET | Freq: Every day | ORAL | 3 refills | Status: DC

## 2021-08-06 NOTE — Telephone Encounter (Signed)
The patient has been notified of the result and verbalized understanding.  All questions (if any) were answered. ?Theresia Majors, RN 08/06/2021 3:58 PM   ?Patient reports that she has trouble hearing and would like me to send her a letter in the mail with all of this information.  ?Rx has been sent in and labs have been scheduled.  ?

## 2021-08-06 NOTE — Telephone Encounter (Signed)
-----   Message from Quintella Reichert, MD sent at 08/01/2021  9:28 AM EDT ----- ?Patient has carotid artery stenosis so LDL goal is less than 70.  Please start atorvastatin 20 mg daily and get a FLP and ALT in 6 weeks ?

## 2021-08-21 ENCOUNTER — Other Ambulatory Visit (INDEPENDENT_AMBULATORY_CARE_PROVIDER_SITE_OTHER): Payer: Medicare PPO

## 2021-08-21 DIAGNOSIS — E890 Postprocedural hypothyroidism: Secondary | ICD-10-CM | POA: Diagnosis not present

## 2021-08-21 LAB — T4, FREE: Free T4: 1.09 ng/dL (ref 0.60–1.60)

## 2021-08-21 LAB — TSH: TSH: 5.44 u[IU]/mL (ref 0.35–5.50)

## 2021-09-18 ENCOUNTER — Other Ambulatory Visit: Payer: Medicare PPO

## 2021-09-30 DIAGNOSIS — H18513 Endothelial corneal dystrophy, bilateral: Secondary | ICD-10-CM | POA: Diagnosis not present

## 2021-09-30 DIAGNOSIS — H35033 Hypertensive retinopathy, bilateral: Secondary | ICD-10-CM | POA: Diagnosis not present

## 2021-09-30 DIAGNOSIS — Z961 Presence of intraocular lens: Secondary | ICD-10-CM | POA: Diagnosis not present

## 2021-09-30 DIAGNOSIS — H401131 Primary open-angle glaucoma, bilateral, mild stage: Secondary | ICD-10-CM | POA: Diagnosis not present

## 2021-10-02 ENCOUNTER — Other Ambulatory Visit: Payer: Medicare PPO | Admitting: *Deleted

## 2021-10-02 DIAGNOSIS — I6523 Occlusion and stenosis of bilateral carotid arteries: Secondary | ICD-10-CM | POA: Diagnosis not present

## 2021-10-02 LAB — LIPID PANEL
Chol/HDL Ratio: 3.4 ratio (ref 0.0–4.4)
Cholesterol, Total: 162 mg/dL (ref 100–199)
HDL: 47 mg/dL (ref >39)
LDL Chol Calc (NIH): 96 mg/dL (ref 0–99)
Triglycerides: 107 mg/dL (ref 0–149)
VLDL Cholesterol Cal: 19 mg/dL (ref 5–40)

## 2021-10-02 LAB — ALT: ALT: 13 IU/L (ref 0–32)

## 2021-10-04 ENCOUNTER — Telehealth: Payer: Self-pay | Admitting: Cardiology

## 2021-10-04 MED ORDER — ATORVASTATIN CALCIUM 40 MG PO TABS: 40.0000 mg | ORAL_TABLET | Freq: Every day | ORAL | 3 refills | Status: DC

## 2021-10-04 NOTE — Telephone Encounter (Signed)
Patient returned called for lab results.  

## 2021-10-04 NOTE — Telephone Encounter (Signed)
-----   Message from Meriam Sprague, MD sent at 10/02/2021  9:11 PM EDT ----- LDL is elevated above goal (goal<70). Can we increase lipitor to 40mg  daily?

## 2021-10-04 NOTE — Telephone Encounter (Signed)
The patient has been notified of the result and verbalized understanding.  All questions (if any) were answered. Antonieta Iba, RN 10/04/2021 4:25 PM  Rx has been sent in for Lipitor 40 mg daily.

## 2021-11-04 ENCOUNTER — Telehealth: Payer: Self-pay | Admitting: Cardiology

## 2021-11-04 DIAGNOSIS — G4733 Obstructive sleep apnea (adult) (pediatric): Secondary | ICD-10-CM

## 2021-11-20 ENCOUNTER — Other Ambulatory Visit: Payer: Self-pay | Admitting: Cardiology

## 2022-01-09 ENCOUNTER — Ambulatory Visit: Payer: Medicare PPO | Admitting: Cardiology

## 2022-01-16 ENCOUNTER — Ambulatory Visit: Payer: Medicare PPO | Admitting: Cardiology

## 2022-02-12 DIAGNOSIS — Z23 Encounter for immunization: Secondary | ICD-10-CM | POA: Diagnosis not present

## 2022-02-12 DIAGNOSIS — G8929 Other chronic pain: Secondary | ICD-10-CM | POA: Diagnosis not present

## 2022-02-12 DIAGNOSIS — M545 Low back pain, unspecified: Secondary | ICD-10-CM | POA: Diagnosis not present

## 2022-02-12 DIAGNOSIS — I10 Essential (primary) hypertension: Secondary | ICD-10-CM | POA: Diagnosis not present

## 2022-02-12 DIAGNOSIS — N1832 Chronic kidney disease, stage 3b: Secondary | ICD-10-CM | POA: Diagnosis not present

## 2022-02-26 DIAGNOSIS — M545 Low back pain, unspecified: Secondary | ICD-10-CM | POA: Diagnosis not present

## 2022-03-27 ENCOUNTER — Other Ambulatory Visit: Payer: Self-pay | Admitting: Internal Medicine

## 2022-03-27 DIAGNOSIS — E890 Postprocedural hypothyroidism: Secondary | ICD-10-CM

## 2022-04-09 DIAGNOSIS — M545 Low back pain, unspecified: Secondary | ICD-10-CM | POA: Diagnosis not present

## 2022-04-09 DIAGNOSIS — M25551 Pain in right hip: Secondary | ICD-10-CM | POA: Diagnosis not present

## 2022-04-14 ENCOUNTER — Ambulatory Visit
Admission: RE | Admit: 2022-04-14 | Discharge: 2022-04-14 | Disposition: A | Discharge status: Home / Self Care | Payer: Medicare PPO | Source: Ambulatory Visit | Attending: Sports Medicine | Admitting: Sports Medicine

## 2022-04-14 ENCOUNTER — Encounter: Payer: Self-pay | Admitting: Sports Medicine

## 2022-04-14 ENCOUNTER — Other Ambulatory Visit: Payer: Self-pay | Admitting: Sports Medicine

## 2022-04-14 DIAGNOSIS — M545 Low back pain, unspecified: Secondary | ICD-10-CM

## 2022-04-14 DIAGNOSIS — M5136 Other intervertebral disc degeneration, lumbar region: Secondary | ICD-10-CM | POA: Diagnosis not present

## 2022-04-14 DIAGNOSIS — M25551 Pain in right hip: Secondary | ICD-10-CM | POA: Diagnosis not present

## 2022-05-14 DIAGNOSIS — G4733 Obstructive sleep apnea (adult) (pediatric): Secondary | ICD-10-CM | POA: Diagnosis not present

## 2022-05-22 ENCOUNTER — Ambulatory Visit: Payer: Medicare PPO | Admitting: Internal Medicine

## 2022-05-22 NOTE — Progress Notes (Deleted)
Patient ID: Kimberly [name], female   DOB: 1941/04/03, 82 y.o.   MRN: [id number]  HPI  Kimberly [name] is a 82 y.o.-year-old female, returning for f/u for hypercalcemia, dx 2012, postsurgical hypothyroidism. Last visit was 1 year ago ago.  She is limited in coming for the appointments by the need for transportation.  Her daughter brought her at last visit.  She was almost an hour later then.  Interim history:   Postsurgical hypothyroidism: - She had thyroidectomy for substernal goiter in 05/2015: Benign nodule pathology  Reviewed history: She had problems with compliance with her levothyroxine in the past.  In 06/2017 he was hospitalized and was found to have a junctional bradycardia with a TSH of 42.  At that time, daughter reported that she was not taking the levothyroxine consistently.  Afterwards, she started to put the medication bottle on her nightstand and was consistently taking it daily.  However, she then had a TSH level that was high, at 15.  TSH remains high so we increased the dose to 150 mcg daily in 07/2019.  At last visit, she was supposed to be on levothyroxine 150 mcg daily (increased 07/2019), but [facility] gave her 175 mcg per review of her pill pack... Stores, we had to reduce the dose of her levothyroxine due to weight loss, currently on 137 mcg daily.  She takes this: - in am - fasting - at least 30 min from b'fast - no calcium  - no iron - no multivitamins - no PPIs - not on Biotin  Reviewed her TFTs: Lab Results  Component Value Date   TSH 5.44 08/21/2021   TSH 0.05 (L) 06/27/2021   TSH 0.10 (L) 05/14/2021   TSH 1.14 09/28/2020   TSH 1.21 03/29/2020   TSH 3.96 09/22/2019   TSH 5.89 (H) 07/20/2019   TSH 6.11 (H) 04/13/2019   TSH 15.700 (H) 02/21/2019   TSH 3.93 06/14/2018   FREET4 1.09 08/21/2021   FREET4 2.19 (H) 06/27/2021   FREET4 1.52 05/14/2021   FREET4 1.38 09/28/2020   FREET4 1.53 03/29/2020   FREET4 1.25 09/22/2019   FREET4 1.18  07/20/2019   FREET4 1.23 04/13/2019   FREET4 0.98 06/14/2018   FREET4 1.19 09/24/2017  07/31/2015: TSH 58  Surprisingly, her calcium has improved after parathyroid surgery in 2016  Primary hyperparathyroidism: -Greatly improved from 2016  Reviewed recent labs: 03/25/2021: Corrected calcium 9.9 Lab Results  Component Value Date   PTH 19 09/18/2015   PTH Comment 09/18/2015   PTH 21 02/08/2015   PTH Comment 02/08/2015   PTH 17 12/09/2013   PTH Comment 12/09/2013   PTH 14.9 08/24/2012   CALCIUM 9.9 12/23/2019   CALCIUM 10.0 10/20/2019   CALCIUM 9.7 10/12/2019   CALCIUM 9.2 05/11/2019   CALCIUM 9.9 05/10/2019   CALCIUM 10.0 02/21/2019   CALCIUM 10.2 07/05/2018   CALCIUM 9.9 06/16/2018   CALCIUM 10.2 06/14/2018   CALCIUM 9.7 06/13/2017  12/25/2014: Calcium 10.8 (8.6-10.3) 09/12/2013: Ca 10.8, iCa 5.7 (4.5-5.6)  Has a history of vitamin D deficiency: Lab Results  Component Value Date   VD25OH 47.58 05/14/2021   VD25OH 27.9 (L) 09/28/2020   VD25OH 34.0 03/29/2020   VD25OH 34.49 07/20/2019   VD25OH 18.27 (L) 04/13/2019   VD25OH 57.47 06/14/2018   VD25OH 61.83 09/24/2017   VD25OH 32.67 09/18/2015   VD25OH 28.65 (L) 02/08/2015   VD25OH 50.59 12/09/2013   She continues on vitamin D 5000 units daily.  Reviewed other pertinent labs: Component  Latest Ref Rng 02/08/2015 02/08/2015         2:14 PM  2:14 PM  Vitamin A (Retinoic Acid)     38 - 98 mcg/dL 89    Component     Latest Ref Rng 12/09/2013  Vitamin D 1, 25 (OH) Total     18 - 72 pg/mL 79 (H)  Vitamin D3 1, 25 (OH)      79  Vitamin D2 1, 25 (OH)      <8  PTH-Related Protein (PTH-RP)     14 - 27 pg/mL 16  Phosphorus     2.3 - 4.6 mg/dL 3.2   Her urine calcium was elevated: Component     Latest Ref Rng 01/20/2014  Calcium, Ur      14  Calcium, 24 hour urine     100 - 250 mg/day 322 (H)  Creatinine, Urine      51.9  Creatinine, 24H Ur     700 - 1800 mg/day 1194   No history of kidney stones. No  history of osteoporosis.  No fractures.  Reviewed her DXA scan report from 2008: [place] (L1-L3)  BMD: 1.273  T-score (% of young adult value): 2.3  Z-score (% adult age match value): 4.1  LEFT HIP (NECK)  BMD: 0.831  T-score (% of young adult value): -0.2  Z-score (% adult age match value): 1.4  Assessment: This patient is considered normal according to [facility]) criteria.   She has CKD (Dr. Moshe Cipro).  Latest BUN/creatinine reviewed: Lab Results  Component Value Date   BUN 37 (H) 12/23/2019   CREATININE 1.57 (H) 12/23/2019    She has had a technetium sestamibi scan (04/2015) that was negative for a parathyroid adenoma e and a CT scan of her neck (04/2015) there was also negative for possible parathyroid nodule nodule but this showed a substernal goiter with tracheal narrowing.  She also has a history of DM2 (managed by PCP) - HbA1c 4.9% on 03/25/2021, HTN, HL.  In 07/2019, she was admitted with encephalopathy either related to UTI or HTN.  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  He then developed dementia and follow-up feeding problems and died.  She was very affected by this, as they were married for 61 years.  She lost more than 20 pounds afterwards.  Weight stabilized in 2022.  ROS: + See HPI  I reviewed pt's medications, allergies, PMH, social hx, family hx, and changes were documented in the history of present illness. Otherwise, unchanged from my initial visit note.  Past Medical History:  Diagnosis Date   Acid reflux    Anxiety    Arthritis    Back spasm    Bradycardia    a. junctional and sinus brady during admission 06/2017 requiring cessation of beta blocker and clonidine.   Carotid artery stenosis    Carotid Dopplers 07/2021 showed 40 to 59% right ICA stenosis and 1 to 39% left ICA stenosis.  The right subclavian artery flow is disturbed.   Chronic  diastolic heart failure ([place]) 03/20/2015   Chronic edema    CKD (chronic kidney disease), stage III (HCC)    Depression    Diabetes mellitus without complication (HCC)    diet controled   Fuch's endothelial dystrophy    bilateral eyes   Headache    occ   HOH (hard of hearing)    wears hearing  aid in left ear   Hypercholesteremia    Hypertensive heart disease with CHF ([place]) 09/11/2014   Insomnia    Junctional bradycardia    Mild mitral regurgitation    Mild tricuspid regurgitation    Normal coronary arteries 2010   a. 2010 cath with no obstructive epicardial disease.  Done for CP which was felt to be due to microvascular angina or diastolic HF.   OSA on CPAP    Followed by Dr. Keturah Barre   Patient's other noncompliance with medication regimen    per notes, gets confused easily with medications, difficult compliance    Past Surgical History:  Procedure Laterality Date   ABDOMINAL HYSTERECTOMY     APPENDECTOMY     CARDIAC CATHETERIZATION     no CAD   CESAREAN SECTION     x 2   CHOLECYSTECTOMY     COLONOSCOPY     EYE SURGERY Right    implant   ROTATOR CUFF REPAIR Left    SHOULDER ARTHROSCOPY WITH SUBACROMIAL DECOMPRESSION AND BICEP TENDON REPAIR Left 08/24/2012   Procedure: SHOULDER ARTHROSCOPY WITH SUBACROMIAL DECOMPRESSION AND BICEP TENDON REPAIR;  Surgeon: Meredith Pel, MD;  Location: [facility];  Service: Orthopedics;  Laterality: Left;  Left Shoulder Arthroscopy, Debridement, Biceps Tenotomy   THYROIDECTOMY N/A 06/12/2015   Procedure: TOTAL THYROIDECTOMY;  Surgeon: Armandina Gemma, MD;  Location: [facility];  Service: General;  Laterality: N/A;   History   Social History   Marital Status: Married    Spouse Name: N/A    Number of Children: 2   Occupational History   Secondary school teacher    Social History Main Topics   Smoking status: Never Smoker    Smokeless tobacco: Not on file   Alcohol Use: No   Drug Use: No   Current Outpatient Medications on File Prior to  Visit  Medication Sig Dispense Refill   amLODipine (NORVASC) 10 MG tablet TAKE ONE TABLET BY MOUTH EVERY MORNING 90 tablet 2   aspirin 81 MG chewable tablet Chew 81 mg by mouth daily.     atorvastatin (LIPITOR) 40 MG tablet Take 1 tablet (40 mg total) by mouth daily. 90 tablet 3   Cholecalciferol (VITAMIN D3) 5000 units CAPS Take 5,000 Units by mouth daily.     cloNIDine (CATAPRES) 0.1 MG tablet Take 0.1 mg by mouth every 6 (six) hours as needed. If BP is over 180     fenofibrate micronized (LOFIBRA) 200 MG capsule Take 200 mg by mouth daily before breakfast.     hydrALAZINE (APRESOLINE) 50 MG tablet TAKE TWO TABLETS BY MOUTH THREE TIMES DAILY 540 tablet 2   levothyroxine (SYNTHROID) 137 MCG tablet TAKE ONE TABLET BY MOUTH BEFORE BREAKFAST 45 tablet 1   losartan (COZAAR) 50 MG tablet TAKE ONE TABLET BY MOUTH EVERY MORNING 90 tablet 2   nitroGLYCERIN (NITROSTAT) 0.4 MG SL tablet Place 1 tablet (0.4 mg total) under the tongue every 5 (five) minutes as needed for chest pain. 25 tablet 11   PARoxetine (PAXIL) 40 MG tablet 1.5 tablets     No current facility-administered medications on file prior to visit.   Allergies  Allergen Reactions   Oxycodone-Acetaminophen Itching, Other (See Comments) and Shortness Of Breath    Pt couldn't breathe or catch her breath Pt couldn't breathe or catch her breath Pt couldn't breathe or catch her breath    Percocet [Oxycodone-Acetaminophen] Shortness Of Breath and Other (See Comments)    Pt couldn't breathe or catch her breath  Codeine Other (See Comments) and Itching    hyperactivity hyperactivity   Other Other (See Comments)    Some pain medication given at [facility] for surgery caused hallucinations    Promethazine Hcl Other (See Comments) and Nausea And Vomiting    Hallucinations  Hallucinations   Clonidine Derivatives     Bradycardia (06/2017)   Hydralazine Other (See Comments)    Headache/dizzy Headache/dizzy   Clonidine Palpitations     Bradycardia (06/2017)   Metoprolol Palpitations    Bradycardia 06/2017  Bradycardia 06/2017   Family History  Problem Relation Age of Onset   Stomach cancer Mother    Cancer Sister        intestinal   PE: There were no vitals taken for this visit.  Wt Readings from Last 3 Encounters:  06/25/21 136 lb 12.8 oz (62.1 kg)  05/14/21 139 lb 3.2 oz (63.1 kg)  11/20/20 140 lb 3.2 oz (63.6 kg)   Constitutional: normal weight, in NAD Eyes: EOMI, no exophthalmos ENT: no thyromegaly, no cervical lymphadenopathy Cardiovascular: RRR, No RG, + 2/6 SEM, + B LE pitting edema Respiratory: CTA B Musculoskeletal:  strength intact in all 4 Skin: moist, warm, no rashes Neurological: no tremor with outstretched hands  Assessment: 1. Postsurgical hypothyroidism  2. H/o Hypercalcemia - ? primary hyperparathyroidism  3.  Vitamin D Insufficiency  Plan: 1. Postsurgical hypothyroidism -She had a high TSH in 02/2019: 15.7.  At that time she was not taking the levothyroxine correctly and missing doses.  She was taking calcium and multivitamins close to levothyroxine and we moved them later in the day.  However, TSH returned high when it was checked during her admission for encephalopathy in 07/2019.  We increased her levothyroxine dose at that time.  However, since then, she lost a significant amount of weight and we had to reduce the dose. - latest thyroid labs reviewed with pt. >> normal: Lab Results  Component Value Date   TSH 5.44 08/21/2021  - she continues on LT4 137 mcg daily - pt feels good on this dose. - we discussed about taking the thyroid hormone every day, with water, >30 minutes before breakfast, separated by >4 hours from acid reflux medications, calcium, iron, multivitamins. Pt. is taking it correctly. - will check thyroid tests today: TSH and fT4 - If labs are abnormal, she will need to return for repeat TFTs in 1.5 months  2. Hypercalcemia -Interestingly, her calcium level  normalized after her thyroid surgery in 2016 -Reviewed previous investigation: Patient has had several instances of elevated calcium, with the highest being at 11.1.  Of note, her PTH levels were repeatedly checked and they were normal.  A PTHrp level was normal, multiple myeloma work-up was negative, vitamin A level was normal, vitamin D was normal or minimally low, she had a high 1, 25 dihydroxy vitamin D with a normal phosphorus.  Her urinary calcium was high in the past.  A technetium sestamibi scan, thyroid ultrasound, and neck CT did not show any parathyroid masses. -No history of nephrolithiasis, abdominal pain -calcium level was normal on 03/25/2021 (9.9 corrected per review of outside records) -No further investigation is needed for this.  2.  Vitamin D insufficiency -She takes vitamin D 5000 units daily -At last check, vitamin D level was normal: Lab Results  Component Value Date   VD25OH 47.58 05/14/2021  -We will recheck the level today  Needs refills - [facility].   Philemon Kingdom, MD PhD [facility]

## 2022-06-14 DIAGNOSIS — G4733 Obstructive sleep apnea (adult) (pediatric): Secondary | ICD-10-CM | POA: Diagnosis not present

## 2022-06-25 ENCOUNTER — Other Ambulatory Visit: Payer: Self-pay | Admitting: Internal Medicine

## 2022-06-25 DIAGNOSIS — E890 Postprocedural hypothyroidism: Secondary | ICD-10-CM

## 2022-07-13 DIAGNOSIS — G4733 Obstructive sleep apnea (adult) (pediatric): Secondary | ICD-10-CM | POA: Diagnosis not present

## 2022-07-24 ENCOUNTER — Other Ambulatory Visit: Payer: Self-pay | Admitting: Internal Medicine

## 2022-07-24 DIAGNOSIS — E890 Postprocedural hypothyroidism: Secondary | ICD-10-CM

## 2022-07-25 ENCOUNTER — Other Ambulatory Visit: Payer: Self-pay | Admitting: Internal Medicine

## 2022-07-25 DIAGNOSIS — E890 Postprocedural hypothyroidism: Secondary | ICD-10-CM

## 2022-07-28 ENCOUNTER — Other Ambulatory Visit: Payer: Self-pay | Admitting: Internal Medicine

## 2022-07-28 DIAGNOSIS — E890 Postprocedural hypothyroidism: Secondary | ICD-10-CM

## 2022-07-30 ENCOUNTER — Telehealth: Payer: Self-pay | Admitting: Internal Medicine

## 2022-07-30 DIAGNOSIS — E890 Postprocedural hypothyroidism: Secondary | ICD-10-CM

## 2022-07-30 MED ORDER — LEVOTHYROXINE SODIUM 137 MCG PO TABS: ORAL_TABLET | ORAL | 0 refills | Status: DC

## 2022-07-30 NOTE — Telephone Encounter (Signed)
Patient is calling to say that she has scheduled an appointment for 08/27/2022, but is getting ready to go out of town and the 15 pills that she got in the refill will not be enough until she gets back from her trip.  Patient is requesting a one month supply of  levothyroxine levothyroxine (SYNTHROID) 137 MCG tablet Be sent in to   [place], [place] - [address] (Ph: [phone])

## 2022-07-30 NOTE — Telephone Encounter (Signed)
Rx sent 

## 2022-08-08 DIAGNOSIS — S0101XA Laceration without foreign body of scalp, initial encounter: Secondary | ICD-10-CM | POA: Diagnosis not present

## 2022-08-13 DIAGNOSIS — G4733 Obstructive sleep apnea (adult) (pediatric): Secondary | ICD-10-CM | POA: Diagnosis not present

## 2022-08-21 ENCOUNTER — Other Ambulatory Visit: Payer: Self-pay | Admitting: Cardiology

## 2022-08-27 ENCOUNTER — Ambulatory Visit: Payer: Medicare PPO | Admitting: Internal Medicine

## 2022-08-27 ENCOUNTER — Encounter: Payer: Self-pay | Admitting: Internal Medicine

## 2022-08-27 VITALS — BP 130/62 | HR 54 | Ht 60.0 in | Wt 141.6 lb

## 2022-08-27 DIAGNOSIS — E559 Vitamin D deficiency, unspecified: Secondary | ICD-10-CM | POA: Diagnosis not present

## 2022-08-27 DIAGNOSIS — E890 Postprocedural hypothyroidism: Secondary | ICD-10-CM

## 2022-08-27 LAB — T4, FREE: Free T4: 0.71 ng/dL (ref 0.60–1.60)

## 2022-08-27 LAB — VITAMIN D 25 HYDROXY (VIT D DEFICIENCY, FRACTURES): VITD: 66.3 ng/mL (ref 30.00–100.00)

## 2022-08-27 LAB — TSH: TSH: 108.08 u[IU]/mL — ABNORMAL HIGH (ref 0.35–5.50)

## 2022-08-27 NOTE — Progress Notes (Unsigned)
Patient ID: Kimberly [name], female   DOB: [birth year], 82 y.o.   MRN: [id number]  HPI  Kimberly [name] is a 82 y.o.-year-old femalemale, returning for f/u for hypercalcemia, dx 2012, postsurgical hypothyroidism. Last visit was 82 year and 3 months ago.  She is limited in coming for the appointments by the need for transportation.  Her daughter brings her.   Interim history: In 07/2019, she was admitted with encephalopathy either related to UTI or hypertensive.  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  He died before last visit.  She was grieving, feeling poorly. She has back pain >> was found to have OA of back and hips. She fell recently >> no fractures, but hit head >> had 2 stitches.   Postsurgical hypothyroidism: - She had thyroidectomy for substernal goiter in 05/2015: Benign nodule pathology  Reviewed and addended history: She had problems with compliance with her levothyroxine in the past.  In 06/2017 he was hospitalized and was found to have a junctional bradycardia with a TSH of 42.  At that time, daughter reported that she was not taking the levothyroxine consistently.  Afterwards, she started to put the medication bottle on her nightstand and was consistently taking it daily.  However, she then had a TSH level that was high, at 15.  TSH remained high so we increased the dose to 150 mcg daily in 07/2019.  She then had an increase in dose to 175 mcg daily but we were then able to reduce the dose gradually.  She is currently taking 137 mcg LT4 daily: - in am - fasting - at least 30 min from b'fast - no calcium  - no iron - no multivitamins - no PPIs - not on Biotin  Reviewed her TFTs: Lab Results  Component Value Date   TSH 5.44 08/21/2021   TSH 0.05 (L) 06/27/2021   TSH 0.10 (L) 05/14/2021   TSH 1.14 09/28/2020   TSH 1.21 03/29/2020   TSH 3.96 09/22/2019   TSH 5.89 (H) 07/20/2019   TSH 6.11 (H)  04/13/2019   TSH 15.700 (H) 02/21/2019   TSH 3.93 06/14/2018   FREET4 1.09 08/21/2021   FREET4 2.19 (H) 06/27/2021   FREET4 1.52 05/14/2021   FREET4 1.38 09/28/2020   FREET4 1.53 03/29/2020   FREET4 1.25 09/22/2019   FREET4 1.18 07/20/2019   FREET4 1.23 04/13/2019   FREET4 0.98 06/14/2018   FREET4 1.19 09/24/2017  07/31/2015: TSH 58  Surprisingly, her calcium has improved after parathyroid surgery in 2016  Primary hyperparathyroidism: -Greatly improved from 2016  Reviewed recent labs: 03/25/2021: Corrected calcium 9.9 Lab Results  Component Value Date   PTH 19 09/18/2015   PTH Comment 09/18/2015   PTH 21 02/08/2015   PTH Comment 02/08/2015   PTH 17 12/09/2013   PTH Comment 12/09/2013   PTH 14.9 08/24/2012   CALCIUM 9.9 12/23/2019   CALCIUM 10.0 10/20/2019   CALCIUM 9.7 10/12/2019   CALCIUM 9.2 05/11/2019   CALCIUM 9.9 05/10/2019   CALCIUM 10.0 02/21/2019   CALCIUM 10.2 07/05/2018   CALCIUM 9.9 06/16/2018   CALCIUM 10.2 06/14/2018   CALCIUM 9.7 06/13/2017  12/25/2014: Calcium 10.8 (8.6-10.3) 09/12/2013: Ca 10.8, iCa 5.7 (4.5-5.6)  Has a history of vitamin D deficiency: Lab Results  Component Value Date   VD25OH 47.58 05/14/2021   VD25OH 27.9 (L) 09/28/2020   VD25OH 34.0 03/29/2020   VD25OH 34.49 07/20/2019  VD25OH 18.27 (L) 04/13/2019   VD25OH 57.47 06/14/2018   VD25OH 61.83 09/24/2017   VD25OH 32.67 09/18/2015   VD25OH 28.65 (L) 02/08/2015   VD25OH 50.59 12/09/2013   She continues on vitamin D 5000 units daily.  Reviewed other pertinent labs: Component     Latest Ref Rng 02/08/2015 02/08/2015         2:14 PM  2:14 PM  Vitamin A (Retinoic Acid)     38 - 98 mcg/dL 89    Component     Latest Ref Rng 12/09/2013  Vitamin D 1, 25 (OH) Total     18 - 72 pg/mL 79 (H)  Vitamin D3 1, 25 (OH)      79  Vitamin D2 1, 25 (OH)      <8  PTH-Related Protein (PTH-RP)     14 - 27 pg/mL 16  Phosphorus     2.3 - 4.6 mg/dL 3.2   Her urine calcium was  elevated: Component     Latest Ref Rng 01/20/2014  Calcium, Ur      14  Calcium, 24 hour urine     100 - 250 mg/day 322 (H)  Creatinine, Urine      51.9  Creatinine, 24H Ur     700 - 1800 mg/day 1194   No history of kidney stones. No history of osteoporosis.  No fractures.  Reviewed her DXA scan report from 2008:  LUMBAR SPINE (L1-L3)  BMD: 1.273  T-score (% of young adult value): 2.3  Z-score (% adult age match value): 4.1  LEFT HIP (NECK)  BMD: 0.831  T-score (% of young adult value): -0.2  Z-score (% adult age match value): 1.4  Assessment: This patient is considered normal according to [facility]) criteria.   She has CKD (Dr. Kathrene Bongo).  Latest BUN/creatinine reviewed: Lab Results  Component Value Date   BUN 37 (H) 12/23/2019   CREATININE 1.57 (H) 12/23/2019    She has had a technetium sestamibi scan (04/2015) that was negative for a parathyroid adenoma e and a CT scan of her neck (04/2015) there was also negative for possible parathyroid nodule nodule but this showed a substernal goiter with tracheal narrowing.  She also has a history of DM2 (managed by PCP) - HbA1c 4.9% on 03/25/2021, HTN, HL.  ROS: + see HPI  I reviewed pt's medications, allergies, PMH, social hx, family hx, and changes were documented in the history of present illness. Otherwise, unchanged from my initial visit note.  Past Medical History:  Diagnosis Date   Acid reflux    Anxiety    Arthritis    Back spasm    Bradycardia    a. junctional and sinus brady during admission 06/2017 requiring cessation of beta blocker and clonidine.   Carotid artery stenosis    Carotid Dopplers 07/2021 showed 40 to 59% right ICA stenosis and 1 to 39% left ICA stenosis.  The right subclavian artery flow is disturbed.   Chronic diastolic heart failure (HCC) 03/20/2015   Chronic edema    CKD (chronic kidney disease), stage III (HCC)    Depression    Diabetes mellitus without complication  (HCC)    diet controled   Fuch's endothelial dystrophy    bilateral eyes   Headache    occ   HOH (hard of hearing)    wears hearing aid in left ear   Hypercholesteremia    Hypertensive heart disease with CHF (HCC) 09/11/2014   Insomnia    Junctional  bradycardia    Mild mitral regurgitation    Mild tricuspid regurgitation    Normal coronary arteries 2010   a. 2010 cath with no obstructive epicardial disease.  Done for CP which was felt to be due to microvascular angina or diastolic HF.   OSA on CPAP    Followed by Dr. Fannie Knee   Patient's other noncompliance with medication regimen    per notes, gets confused easily with medications, difficult compliance    Past Surgical History:  Procedure Laterality Date   ABDOMINAL HYSTERECTOMY     APPENDECTOMY     CARDIAC CATHETERIZATION     no CAD   CESAREAN SECTION     x 2   CHOLECYSTECTOMY     COLONOSCOPY     EYE SURGERY Right    implant   ROTATOR CUFF REPAIR Left    SHOULDER ARTHROSCOPY WITH SUBACROMIAL DECOMPRESSION AND BICEP TENDON REPAIR Left 08/24/2012   Procedure: SHOULDER ARTHROSCOPY WITH SUBACROMIAL DECOMPRESSION AND BICEP TENDON REPAIR;  Surgeon: Cammy Copa, MD;  Location: [facility] OR;  Service: Orthopedics;  Laterality: Left;  Left Shoulder Arthroscopy, Debridement, Biceps Tenotomy   THYROIDECTOMY N/A 06/12/2015   Procedure: TOTAL THYROIDECTOMY;  Surgeon: Darnell Level, MD;  Location: [facility] OR;  Service: General;  Laterality: N/A;   History   Social History   Marital Status: Married    Spouse Name: N/A    Number of Children: 2   Occupational History   Radiographer, therapeutic    Social History Main Topics   Smoking status: Never Smoker    Smokeless tobacco: Not on file   Alcohol Use: No   Drug Use: No   Current Outpatient Medications on File Prior to Visit  Medication Sig Dispense Refill   amLODipine (NORVASC) 10 MG tablet TAKE ONE TABLET BY MOUTH EVERY MORNING 90 tablet 0   aspirin 81 MG chewable tablet Chew  81 mg by mouth daily.     atorvastatin (LIPITOR) 40 MG tablet Take 1 tablet (40 mg total) by mouth daily. 90 tablet 3   Cholecalciferol (VITAMIN D3) 5000 units CAPS Take 5,000 Units by mouth daily.     cloNIDine (CATAPRES) 0.1 MG tablet Take 0.1 mg by mouth every 6 (six) hours as needed. If BP is over 180     fenofibrate micronized (LOFIBRA) 200 MG capsule Take 200 mg by mouth daily before breakfast.     hydrALAZINE (APRESOLINE) 50 MG tablet TAKE TWO TABLETS BY MOUTH THREE TIMES DAILY 540 tablet 0   levothyroxine (SYNTHROID) 137 MCG tablet TAKE ONE TABLET BY MOUTH BEFORE BREAKFAST 30 tablet 0   losartan (COZAAR) 50 MG tablet TAKE ONE TABLET BY MOUTH EVERY MORNING 90 tablet 0   nitroGLYCERIN (NITROSTAT) 0.4 MG SL tablet Place 1 tablet (0.4 mg total) under the tongue every 5 (five) minutes as needed for chest pain. 25 tablet 11   PARoxetine (PAXIL) 40 MG tablet 1.5 tablets     No current facility-administered medications on file prior to visit.   Allergies  Allergen Reactions   Oxycodone-Acetaminophen Itching, Other (See Comments) and Shortness Of Breath    Pt couldn't breathe or catch her breath Pt couldn't breathe or catch her breath Pt couldn't breathe or catch her breath    Percocet [Oxycodone-Acetaminophen] Shortness Of Breath and Other (See Comments)    Pt couldn't breathe or catch her breath   Codeine Other (See Comments) and Itching    hyperactivity hyperactivity   Other Other (See Comments)    Some pain medication  given at [facility] for surgery caused hallucinations    Promethazine Hcl Other (See Comments) and Nausea And Vomiting    Hallucinations  Hallucinations   Clonidine Derivatives     Bradycardia (06/2017)   Hydralazine Other (See Comments)    Headache/dizzy Headache/dizzy   Clonidine Palpitations    Bradycardia (06/2017)   Metoprolol Palpitations    Bradycardia 06/2017  Bradycardia 06/2017   Family History  Problem Relation Age of Onset   Stomach cancer  Mother    Cancer Sister        intestinal   PE: BP 130/62 (BP Location: Left Arm, Patient Position: Sitting, Cuff Size: Normal)   Pulse (!) 54   Ht 5' (1.524 m)   Wt 141 lb 9.6 oz (64.2 kg)   SpO2 97%   BMI 27.65 kg/m   Wt Readings from Last 3 Encounters:  08/27/22 141 lb 9.6 oz (64.2 kg)  06/25/21 136 lb 12.8 oz (62.1 kg)  05/14/21 139 lb 3.2 oz (63.1 kg)   Constitutional: overweight, in NAD Eyes: EOMI, no exophthalmos ENT: no thyromegaly, no cervical lymphadenopathy Cardiovascular: RRR, No RG, + 2/6 SEM, + B LE pitting edema Respiratory: CTA B Musculoskeletal: no deformities Skin:  no rashes Neurological: no tremor with outstretched hands  Assessment: 1. Postsurgical hypothyroidism  2. H/o Hypercalcemia - ? primary hyperparathyroidism  3.  Vitamin D Insufficiency  Plan: 1. Postsurgical hypothyroidism -She had a high TSH in 02/2019: 15.7.  At that time she was not taking the levothyroxine correctly and missing doses.  She was taking calcium and multivitamins close to levothyroxine and we moved them later in the day.  However, TSH returned high when it was checked during her admission for encephalopathy in 07/2019.  We increased her levothyroxine dose at that time.  However, afterwards, we gradually decrease the dose, most likely due to weight loss. - latest thyroid labs reviewed with pt. >> normal: Lab Results  Component Value Date   TSH 5.44 08/21/2021  - she continues on LT4 137 mcg daily - pt feels good on this dose. - we discussed about taking the thyroid hormone every day, with water, >30 minutes before breakfast, separated by >4 hours from acid reflux medications, calcium, iron, multivitamins. Pt. is taking it correctly, I believe, but it is not clear whether she is taking multivitamins or not... I did explain that if she takes them, to take them more than 4 hours after levothyroxine. - will check thyroid tests today: TSH and fT4 - If labs are abnormal, she will  need to return for repeat TFTs in 1.5 months - OTW, I will see her back in 1 year  2. Hypercalcemia -Interestingly, her calcium normalized after her thyroid surgery in 2016. -Reviewed previous investigation: Patient has had several instances of elevated calcium, with the highest being at 11.1.  Of note, her PTH levels were repeatedly checked and they were normal.  A PTHrp level was normal, multiple myeloma work-up was negative, vitamin A level was normal, vitamin D was normal or minimally low, she had a high 1, 25 dihydroxy vitamin D with a normal phosphorus.  Her urinary calcium was high in the past.  A technetium sestamibi scan, thyroid ultrasound, and neck CT did not show any parathyroid masses. -No apparent complications from hypercalcemia: No nephrolithiasis, abdominal pain -Latest calcium level was normal 03/2021 (9.9 corrected per review of outside records) -Will recheck the calcium level today  3.  Vitamin D insufficiency -She continues on 5000 units vitamin D  daily -Her latest vitamin D level from 05/14/2021 was normal, at 47.58 -Will repeat this today  Needs refills LT4 - [facility].  Carlus Pavlov, MD PhD [facility] Endocrinology

## 2022-08-27 NOTE — Patient Instructions (Signed)
  Please continue levothyroxine 137 mcg daily.  Take the thyroid hormone every day, with water, at least 30 minutes before breakfast, separated by at least 4 hours from: - acid reflux medications - calcium - iron - multivitamins  Please continue vitamin D 5000 units daily.  Please stop at the lab.  Please come back for a follow-up appointment in 1 year.

## 2022-08-28 LAB — CALCIUM: Calcium: 9.9 mg/dL (ref 8.4–10.5)

## 2022-09-04 ENCOUNTER — Other Ambulatory Visit: Payer: Self-pay

## 2022-09-04 DIAGNOSIS — E890 Postprocedural hypothyroidism: Secondary | ICD-10-CM

## 2022-09-04 MED ORDER — LEVOTHYROXINE SODIUM 137 MCG PO TABS: ORAL_TABLET | ORAL | 1 refills | Status: DC

## 2022-09-10 DIAGNOSIS — F339 Major depressive disorder, recurrent, unspecified: Secondary | ICD-10-CM | POA: Diagnosis not present

## 2022-09-10 DIAGNOSIS — Z6827 Body mass index (BMI) 27.0-27.9, adult: Secondary | ICD-10-CM | POA: Diagnosis not present

## 2022-09-10 DIAGNOSIS — Z23 Encounter for immunization: Secondary | ICD-10-CM | POA: Diagnosis not present

## 2022-09-10 DIAGNOSIS — I130 Hypertensive heart and chronic kidney disease with heart failure and stage 1 through stage 4 chronic kidney disease, or unspecified chronic kidney disease: Secondary | ICD-10-CM | POA: Diagnosis not present

## 2022-09-10 DIAGNOSIS — N1832 Chronic kidney disease, stage 3b: Secondary | ICD-10-CM | POA: Diagnosis not present

## 2022-09-10 DIAGNOSIS — E039 Hypothyroidism, unspecified: Secondary | ICD-10-CM | POA: Diagnosis not present

## 2022-09-10 DIAGNOSIS — I5032 Chronic diastolic (congestive) heart failure: Secondary | ICD-10-CM | POA: Diagnosis not present

## 2022-09-10 DIAGNOSIS — E113291 Type 2 diabetes mellitus with mild nonproliferative diabetic retinopathy without macular edema, right eye: Secondary | ICD-10-CM | POA: Diagnosis not present

## 2022-09-10 DIAGNOSIS — E7800 Pure hypercholesterolemia, unspecified: Secondary | ICD-10-CM | POA: Diagnosis not present

## 2022-09-10 DIAGNOSIS — E1169 Type 2 diabetes mellitus with other specified complication: Secondary | ICD-10-CM | POA: Diagnosis not present

## 2022-09-10 DIAGNOSIS — E1122 Type 2 diabetes mellitus with diabetic chronic kidney disease: Secondary | ICD-10-CM | POA: Diagnosis not present

## 2022-09-12 DIAGNOSIS — G4733 Obstructive sleep apnea (adult) (pediatric): Secondary | ICD-10-CM | POA: Diagnosis not present

## 2022-09-26 ENCOUNTER — Other Ambulatory Visit: Payer: Self-pay | Admitting: Cardiology

## 2022-10-13 DIAGNOSIS — G4733 Obstructive sleep apnea (adult) (pediatric): Secondary | ICD-10-CM | POA: Diagnosis not present

## 2022-10-15 DIAGNOSIS — E890 Postprocedural hypothyroidism: Secondary | ICD-10-CM | POA: Diagnosis not present

## 2022-10-15 DIAGNOSIS — R600 Localized edema: Secondary | ICD-10-CM | POA: Diagnosis not present

## 2022-10-15 DIAGNOSIS — Z0000 Encounter for general adult medical examination without abnormal findings: Secondary | ICD-10-CM | POA: Diagnosis not present

## 2022-10-15 DIAGNOSIS — E039 Hypothyroidism, unspecified: Secondary | ICD-10-CM | POA: Diagnosis not present

## 2022-10-15 DIAGNOSIS — Z6826 Body mass index (BMI) 26.0-26.9, adult: Secondary | ICD-10-CM | POA: Diagnosis not present

## 2022-10-16 ENCOUNTER — Ambulatory Visit: Payer: Medicare PPO | Admitting: Cardiology

## 2022-11-12 DIAGNOSIS — G4733 Obstructive sleep apnea (adult) (pediatric): Secondary | ICD-10-CM | POA: Diagnosis not present

## 2022-11-21 ENCOUNTER — Other Ambulatory Visit: Payer: Self-pay | Admitting: Cardiology

## 2022-11-26 DIAGNOSIS — R051 Acute cough: Secondary | ICD-10-CM | POA: Diagnosis not present

## 2022-11-26 DIAGNOSIS — J189 Pneumonia, unspecified organism: Secondary | ICD-10-CM | POA: Diagnosis not present

## 2022-12-09 ENCOUNTER — Ambulatory Visit: Payer: Medicare PPO | Admitting: Cardiology

## 2022-12-09 DIAGNOSIS — Z8701 Personal history of pneumonia (recurrent): Secondary | ICD-10-CM | POA: Diagnosis not present

## 2022-12-09 DIAGNOSIS — R059 Cough, unspecified: Secondary | ICD-10-CM | POA: Diagnosis not present

## 2022-12-09 DIAGNOSIS — Z6825 Body mass index (BMI) 25.0-25.9, adult: Secondary | ICD-10-CM | POA: Diagnosis not present

## 2022-12-09 DIAGNOSIS — R5383 Other fatigue: Secondary | ICD-10-CM | POA: Diagnosis not present

## 2022-12-11 ENCOUNTER — Other Ambulatory Visit: Payer: Self-pay | Admitting: Family Medicine

## 2022-12-11 ENCOUNTER — Ambulatory Visit
Admission: RE | Admit: 2022-12-11 | Discharge: 2022-12-11 | Disposition: A | Discharge status: Home / Self Care | Payer: Medicare PPO | Source: Ambulatory Visit | Attending: Family Medicine | Admitting: Family Medicine

## 2022-12-11 DIAGNOSIS — R053 Chronic cough: Secondary | ICD-10-CM | POA: Diagnosis not present

## 2022-12-11 DIAGNOSIS — Z8701 Personal history of pneumonia (recurrent): Secondary | ICD-10-CM

## 2022-12-19 DIAGNOSIS — Z6826 Body mass index (BMI) 26.0-26.9, adult: Secondary | ICD-10-CM | POA: Diagnosis not present

## 2022-12-19 DIAGNOSIS — J209 Acute bronchitis, unspecified: Secondary | ICD-10-CM | POA: Diagnosis not present

## 2022-12-19 DIAGNOSIS — I5032 Chronic diastolic (congestive) heart failure: Secondary | ICD-10-CM | POA: Diagnosis not present

## 2022-12-19 DIAGNOSIS — R053 Chronic cough: Secondary | ICD-10-CM | POA: Diagnosis not present

## 2022-12-19 DIAGNOSIS — E119 Type 2 diabetes mellitus without complications: Secondary | ICD-10-CM | POA: Diagnosis not present

## 2022-12-30 ENCOUNTER — Other Ambulatory Visit: Payer: Self-pay | Admitting: Cardiology

## 2023-01-18 ENCOUNTER — Other Ambulatory Visit: Payer: Self-pay

## 2023-01-18 ENCOUNTER — Emergency Department (HOSPITAL_BASED_OUTPATIENT_CLINIC_OR_DEPARTMENT_OTHER): Payer: Medicare PPO

## 2023-01-18 ENCOUNTER — Inpatient Hospital Stay (HOSPITAL_BASED_OUTPATIENT_CLINIC_OR_DEPARTMENT_OTHER)
Admission: EM | Admit: 2023-01-18 | Discharge: 2023-01-20 | DRG: 304 | Disposition: A | Discharge status: Home / Self Care | Payer: Medicare PPO | Attending: Internal Medicine | Admitting: Internal Medicine

## 2023-01-18 ENCOUNTER — Encounter (HOSPITAL_BASED_OUTPATIENT_CLINIC_OR_DEPARTMENT_OTHER): Payer: Self-pay | Admitting: Emergency Medicine

## 2023-01-18 DIAGNOSIS — E890 Postprocedural hypothyroidism: Secondary | ICD-10-CM | POA: Diagnosis present

## 2023-01-18 DIAGNOSIS — I169 Hypertensive crisis, unspecified: Secondary | ICD-10-CM | POA: Diagnosis not present

## 2023-01-18 DIAGNOSIS — Z7982 Long term (current) use of aspirin: Secondary | ICD-10-CM | POA: Diagnosis not present

## 2023-01-18 DIAGNOSIS — Z91130 Patient's unintentional underdosing of medication regimen due to age-related debility: Secondary | ICD-10-CM

## 2023-01-18 DIAGNOSIS — Z1152 Encounter for screening for COVID-19: Secondary | ICD-10-CM

## 2023-01-18 DIAGNOSIS — Z9049 Acquired absence of other specified parts of digestive tract: Secondary | ICD-10-CM

## 2023-01-18 DIAGNOSIS — I10 Essential (primary) hypertension: Secondary | ICD-10-CM | POA: Diagnosis not present

## 2023-01-18 DIAGNOSIS — D61818 Other pancytopenia: Secondary | ICD-10-CM | POA: Diagnosis present

## 2023-01-18 DIAGNOSIS — I161 Hypertensive emergency: Secondary | ICD-10-CM

## 2023-01-18 DIAGNOSIS — E7800 Pure hypercholesterolemia, unspecified: Secondary | ICD-10-CM | POA: Diagnosis present

## 2023-01-18 DIAGNOSIS — J40 Bronchitis, not specified as acute or chronic: Secondary | ICD-10-CM | POA: Diagnosis present

## 2023-01-18 DIAGNOSIS — Z79899 Other long term (current) drug therapy: Secondary | ICD-10-CM | POA: Diagnosis not present

## 2023-01-18 DIAGNOSIS — H9190 Unspecified hearing loss, unspecified ear: Secondary | ICD-10-CM | POA: Diagnosis present

## 2023-01-18 DIAGNOSIS — T50916A Underdosing of multiple unspecified drugs, medicaments and biological substances, initial encounter: Secondary | ICD-10-CM | POA: Diagnosis present

## 2023-01-18 DIAGNOSIS — R053 Chronic cough: Secondary | ICD-10-CM | POA: Diagnosis not present

## 2023-01-18 DIAGNOSIS — N1832 Chronic kidney disease, stage 3b: Secondary | ICD-10-CM | POA: Diagnosis present

## 2023-01-18 DIAGNOSIS — Z8701 Personal history of pneumonia (recurrent): Secondary | ICD-10-CM | POA: Diagnosis not present

## 2023-01-18 DIAGNOSIS — I130 Hypertensive heart and chronic kidney disease with heart failure and stage 1 through stage 4 chronic kidney disease, or unspecified chronic kidney disease: Secondary | ICD-10-CM | POA: Diagnosis present

## 2023-01-18 DIAGNOSIS — I5032 Chronic diastolic (congestive) heart failure: Secondary | ICD-10-CM | POA: Diagnosis present

## 2023-01-18 DIAGNOSIS — I272 Pulmonary hypertension, unspecified: Secondary | ICD-10-CM | POA: Diagnosis present

## 2023-01-18 DIAGNOSIS — R059 Cough, unspecified: Secondary | ICD-10-CM | POA: Diagnosis not present

## 2023-01-18 DIAGNOSIS — D631 Anemia in chronic kidney disease: Secondary | ICD-10-CM | POA: Diagnosis present

## 2023-01-18 DIAGNOSIS — I3139 Other pericardial effusion (noninflammatory): Secondary | ICD-10-CM | POA: Diagnosis present

## 2023-01-18 DIAGNOSIS — Z79890 Hormone replacement therapy (postmenopausal): Secondary | ICD-10-CM

## 2023-01-18 DIAGNOSIS — R509 Fever, unspecified: Secondary | ICD-10-CM | POA: Diagnosis not present

## 2023-01-18 DIAGNOSIS — R7989 Other specified abnormal findings of blood chemistry: Secondary | ICD-10-CM | POA: Diagnosis present

## 2023-01-18 DIAGNOSIS — D649 Anemia, unspecified: Secondary | ICD-10-CM | POA: Diagnosis not present

## 2023-01-18 DIAGNOSIS — Z90710 Acquired absence of both cervix and uterus: Secondary | ICD-10-CM

## 2023-01-18 DIAGNOSIS — Z888 Allergy status to other drugs, medicaments and biological substances status: Secondary | ICD-10-CM

## 2023-01-18 DIAGNOSIS — I160 Hypertensive urgency: Principal | ICD-10-CM | POA: Diagnosis present

## 2023-01-18 DIAGNOSIS — J069 Acute upper respiratory infection, unspecified: Secondary | ICD-10-CM | POA: Diagnosis not present

## 2023-01-18 DIAGNOSIS — Z9989 Dependence on other enabling machines and devices: Secondary | ICD-10-CM

## 2023-01-18 DIAGNOSIS — J189 Pneumonia, unspecified organism: Secondary | ICD-10-CM | POA: Diagnosis present

## 2023-01-18 DIAGNOSIS — E1122 Type 2 diabetes mellitus with diabetic chronic kidney disease: Secondary | ICD-10-CM | POA: Diagnosis present

## 2023-01-18 DIAGNOSIS — I6529 Occlusion and stenosis of unspecified carotid artery: Secondary | ICD-10-CM | POA: Diagnosis present

## 2023-01-18 DIAGNOSIS — G4733 Obstructive sleep apnea (adult) (pediatric): Secondary | ICD-10-CM | POA: Diagnosis present

## 2023-01-18 DIAGNOSIS — Z800 Family history of malignant neoplasm of digestive organs: Secondary | ICD-10-CM

## 2023-01-18 DIAGNOSIS — N1831 Chronic kidney disease, stage 3a: Secondary | ICD-10-CM | POA: Diagnosis not present

## 2023-01-18 DIAGNOSIS — Z91148 Patient's other noncompliance with medication regimen for other reason: Secondary | ICD-10-CM

## 2023-01-18 DIAGNOSIS — R0602 Shortness of breath: Secondary | ICD-10-CM | POA: Diagnosis not present

## 2023-01-18 DIAGNOSIS — R062 Wheezing: Secondary | ICD-10-CM | POA: Diagnosis not present

## 2023-01-18 DIAGNOSIS — I5030 Unspecified diastolic (congestive) heart failure: Secondary | ICD-10-CM | POA: Diagnosis present

## 2023-01-18 DIAGNOSIS — I517 Cardiomegaly: Secondary | ICD-10-CM | POA: Diagnosis not present

## 2023-01-18 DIAGNOSIS — Z885 Allergy status to narcotic agent status: Secondary | ICD-10-CM

## 2023-01-18 DIAGNOSIS — R918 Other nonspecific abnormal finding of lung field: Secondary | ICD-10-CM | POA: Diagnosis not present

## 2023-01-18 DIAGNOSIS — Z974 Presence of external hearing-aid: Secondary | ICD-10-CM

## 2023-01-18 LAB — CBC WITH DIFFERENTIAL/PLATELET
Abs Immature Granulocytes: 0.02 10*3/uL (ref 0.00–0.07)
Basophils Absolute: 0 10*3/uL (ref 0.0–0.1)
Basophils Relative: 0 %
Eosinophils Absolute: 0.1 10*3/uL (ref 0.0–0.5)
Eosinophils Relative: 2 %
HCT: 27.7 % — ABNORMAL LOW (ref 36.0–46.0)
Hemoglobin: 9.2 g/dL — ABNORMAL LOW (ref 12.0–15.0)
Immature Granulocytes: 0 %
Lymphocytes Relative: 14 %
Lymphs Abs: 0.7 10*3/uL (ref 0.7–4.0)
MCH: 29.5 pg (ref 26.0–34.0)
MCHC: 33.2 g/dL (ref 30.0–36.0)
MCV: 88.8 fL (ref 80.0–100.0)
Monocytes Absolute: 0.4 10*3/uL (ref 0.1–1.0)
Monocytes Relative: 8 %
Neutro Abs: 3.4 10*3/uL (ref 1.7–7.7)
Neutrophils Relative %: 76 %
Platelets: 175 10*3/uL (ref 150–400)
RBC: 3.12 MIL/uL — ABNORMAL LOW (ref 3.87–5.11)
RDW: 14.6 % (ref 11.5–15.5)
WBC: 4.6 10*3/uL (ref 4.0–10.5)
nRBC: 0 % (ref 0.0–0.2)

## 2023-01-18 LAB — TROPONIN I (HIGH SENSITIVITY)
Troponin I (High Sensitivity): 18 ng/L — ABNORMAL HIGH (ref <18)
Troponin I (High Sensitivity): 42 ng/L — ABNORMAL HIGH (ref <18)

## 2023-01-18 LAB — COMPREHENSIVE METABOLIC PANEL
ALT: 15 U/L (ref 0–44)
AST: 26 U/L (ref 15–41)
Albumin: 3.5 g/dL (ref 3.5–5.0)
Alkaline Phosphatase: 33 U/L — ABNORMAL LOW (ref 38–126)
Anion gap: 8 (ref 5–15)
BUN: 32 mg/dL — ABNORMAL HIGH (ref 8–23)
CO2: 25 mmol/L (ref 22–32)
Calcium: 9.3 mg/dL (ref 8.9–10.3)
Chloride: 105 mmol/L (ref 98–111)
Creatinine, Ser: 1.19 mg/dL — ABNORMAL HIGH (ref 0.44–1.00)
GFR, Estimated: 46 mL/min — ABNORMAL LOW (ref >60)
Glucose, Bld: 175 mg/dL — ABNORMAL HIGH (ref 70–99)
Potassium: 3.6 mmol/L (ref 3.5–5.1)
Sodium: 138 mmol/L (ref 135–145)
Total Bilirubin: 0.4 mg/dL (ref 0.3–1.2)
Total Protein: 6.3 g/dL — ABNORMAL LOW (ref 6.5–8.1)

## 2023-01-18 LAB — SARS CORONAVIRUS 2 BY RT PCR: SARS Coronavirus 2 by RT PCR: NEGATIVE

## 2023-01-18 LAB — BRAIN NATRIURETIC PEPTIDE: B Natriuretic Peptide: 230.6 pg/mL — ABNORMAL HIGH (ref 0.0–100.0)

## 2023-01-18 MED ORDER — SODIUM CHLORIDE 0.9 % IV SOLN
1.0000 g | Freq: Once | INTRAVENOUS | Status: AC
  Administered 2023-01-18: 1 g via INTRAVENOUS
  Filled 2023-01-18: qty 10

## 2023-01-18 MED ORDER — ONDANSETRON HCL 4 MG/2ML IJ SOLN: 4.0000 mg | Freq: Four times a day (QID) | INTRAMUSCULAR | Status: DC | PRN

## 2023-01-18 MED ORDER — IPRATROPIUM-ALBUTEROL 0.5-2.5 (3) MG/3ML IN SOLN
3.0000 mL | Freq: Once | RESPIRATORY_TRACT | Status: AC
  Administered 2023-01-18: 3 mL via RESPIRATORY_TRACT
  Filled 2023-01-18: qty 3

## 2023-01-18 MED ORDER — ATORVASTATIN CALCIUM 40 MG PO TABS
40.0000 mg | ORAL_TABLET | Freq: Every day | ORAL | Status: DC
  Administered 2023-01-19 – 2023-01-20 (×2): 40 mg via ORAL
  Filled 2023-01-18 (×2): qty 1

## 2023-01-18 MED ORDER — PAROXETINE HCL 20 MG PO TABS
40.0000 mg | ORAL_TABLET | Freq: Every day | ORAL | Status: DC
  Administered 2023-01-19 – 2023-01-20 (×2): 40 mg via ORAL
  Filled 2023-01-18 (×2): qty 2

## 2023-01-18 MED ORDER — LABETALOL HCL 5 MG/ML IV SOLN
10.0000 mg | INTRAVENOUS | Status: DC | PRN
  Administered 2023-01-18: 10 mg via INTRAVENOUS
  Filled 2023-01-18 (×2): qty 4

## 2023-01-18 MED ORDER — ASPIRIN 81 MG PO CHEW
81.0000 mg | CHEWABLE_TABLET | Freq: Every day | ORAL | Status: DC
  Administered 2023-01-19 – 2023-01-20 (×2): 81 mg via ORAL
  Filled 2023-01-18 (×2): qty 1

## 2023-01-18 MED ORDER — GUAIFENESIN 100 MG/5ML PO LIQD
5.0000 mL | ORAL | Status: DC | PRN
  Administered 2023-01-18 – 2023-01-19 (×2): 5 mL via ORAL
  Filled 2023-01-18 (×2): qty 10

## 2023-01-18 MED ORDER — HEPARIN SODIUM (PORCINE) 5000 UNIT/ML IJ SOLN
5000.0000 [IU] | Freq: Three times a day (TID) | INTRAMUSCULAR | Status: DC
  Administered 2023-01-19 – 2023-01-20 (×3): 5000 [IU] via SUBCUTANEOUS
  Filled 2023-01-18 (×3): qty 1

## 2023-01-18 MED ORDER — METHYLPREDNISOLONE SODIUM SUCC 125 MG IJ SOLR
125.0000 mg | Freq: Once | INTRAMUSCULAR | Status: AC
  Administered 2023-01-18: 125 mg via INTRAVENOUS
  Filled 2023-01-18: qty 2

## 2023-01-18 MED ORDER — ASPIRIN 325 MG PO TABS
325.0000 mg | ORAL_TABLET | Freq: Every day | ORAL | Status: DC
  Administered 2023-01-18: 325 mg via ORAL
  Filled 2023-01-18: qty 1

## 2023-01-18 MED ORDER — LEVOTHYROXINE SODIUM 25 MCG PO TABS
137.0000 ug | ORAL_TABLET | Freq: Every day | ORAL | Status: DC
  Administered 2023-01-19 – 2023-01-20 (×2): 137 ug via ORAL
  Filled 2023-01-18 (×2): qty 1

## 2023-01-18 MED ORDER — ALBUTEROL SULFATE (2.5 MG/3ML) 0.083% IN NEBU: 2.5000 mg | INHALATION_SOLUTION | RESPIRATORY_TRACT | Status: DC | PRN

## 2023-01-18 MED ORDER — SODIUM CHLORIDE 0.9 % IV SOLN
500.0000 mg | Freq: Once | INTRAVENOUS | Status: AC
  Administered 2023-01-18: 500 mg via INTRAVENOUS
  Filled 2023-01-18: qty 5

## 2023-01-18 MED ORDER — AMLODIPINE BESYLATE 10 MG PO TABS
10.0000 mg | ORAL_TABLET | Freq: Every day | ORAL | Status: DC
  Administered 2023-01-19 – 2023-01-20 (×2): 10 mg via ORAL
  Filled 2023-01-18 (×2): qty 1

## 2023-01-18 MED ORDER — ACETAMINOPHEN 325 MG PO TABS
650.0000 mg | ORAL_TABLET | Freq: Four times a day (QID) | ORAL | Status: DC | PRN
  Filled 2023-01-18: qty 2

## 2023-01-18 MED ORDER — ALBUTEROL SULFATE HFA 108 (90 BASE) MCG/ACT IN AERS: 2.0000 | INHALATION_SPRAY | RESPIRATORY_TRACT | Status: DC | PRN

## 2023-01-18 MED ORDER — HYDRALAZINE HCL 50 MG PO TABS
50.0000 mg | ORAL_TABLET | Freq: Three times a day (TID) | ORAL | Status: DC
  Administered 2023-01-18 – 2023-01-20 (×5): 50 mg via ORAL
  Filled 2023-01-18 (×5): qty 1

## 2023-01-18 MED ORDER — AZITHROMYCIN 250 MG PO TABS
500.0000 mg | ORAL_TABLET | Freq: Every day | ORAL | Status: DC
  Administered 2023-01-19 – 2023-01-20 (×2): 500 mg via ORAL
  Filled 2023-01-18 (×2): qty 2

## 2023-01-18 MED ORDER — LABETALOL HCL 5 MG/ML IV SOLN
10.0000 mg | Freq: Once | INTRAVENOUS | Status: AC
  Administered 2023-01-18: 10 mg via INTRAVENOUS
  Filled 2023-01-18: qty 4

## 2023-01-18 MED ORDER — LOSARTAN POTASSIUM 50 MG PO TABS
50.0000 mg | ORAL_TABLET | Freq: Every day | ORAL | Status: DC
  Administered 2023-01-19: 50 mg via ORAL
  Filled 2023-01-18: qty 1

## 2023-01-18 MED ORDER — AMLODIPINE BESYLATE 5 MG PO TABS
10.0000 mg | ORAL_TABLET | Freq: Once | ORAL | Status: AC
  Administered 2023-01-18: 10 mg via ORAL
  Filled 2023-01-18: qty 2

## 2023-01-18 MED ORDER — SODIUM CHLORIDE 0.9 % IV SOLN
2.0000 g | INTRAVENOUS | Status: DC
  Administered 2023-01-19: 2 g via INTRAVENOUS
  Filled 2023-01-18: qty 20

## 2023-01-18 MED ORDER — IOHEXOL 350 MG/ML SOLN
75.0000 mL | Freq: Once | INTRAVENOUS | Status: AC | PRN
  Administered 2023-01-18: 75 mL via INTRAVENOUS

## 2023-01-18 MED ORDER — ONDANSETRON HCL 4 MG PO TABS: 4.0000 mg | ORAL_TABLET | Freq: Four times a day (QID) | ORAL | Status: DC | PRN

## 2023-01-18 MED ORDER — SODIUM CHLORIDE 0.9% FLUSH
3.0000 mL | Freq: Two times a day (BID) | INTRAVENOUS | Status: DC
  Administered 2023-01-19 – 2023-01-20 (×3): 3 mL via INTRAVENOUS

## 2023-01-18 MED ORDER — LOSARTAN POTASSIUM 25 MG PO TABS
50.0000 mg | ORAL_TABLET | Freq: Once | ORAL | Status: AC
  Administered 2023-01-18: 50 mg via ORAL
  Filled 2023-01-18: qty 2

## 2023-01-18 MED ORDER — HYDRALAZINE HCL 25 MG PO TABS
50.0000 mg | ORAL_TABLET | Freq: Once | ORAL | Status: AC
  Administered 2023-01-18: 50 mg via ORAL
  Filled 2023-01-18: qty 2

## 2023-01-18 MED ORDER — SENNOSIDES-DOCUSATE SODIUM 8.6-50 MG PO TABS: 1.0000 | ORAL_TABLET | Freq: Every evening | ORAL | Status: DC | PRN

## 2023-01-18 MED ORDER — ACETAMINOPHEN 650 MG RE SUPP: 650.0000 mg | Freq: Four times a day (QID) | RECTAL | Status: DC | PRN

## 2023-01-18 NOTE — ED Notes (Signed)
Attempted to call report to [facility], told to call back in a few minutes

## 2023-01-18 NOTE — ED Triage Notes (Addendum)
Was seen ar [facility] with persistent cough , recent Dx bronchitis . Sent her for hypertension evaluation .  Hx of It , took it yesterday but not today . Denies shortness of breath or chest pain .  Daughter is the historian , pt is extremely hard hearing .

## 2023-01-18 NOTE — H&P (Addendum)
History and Physical    [name]:[id number] DOB: [birth year] DOA: 01/18/2023  PCP: Clayborn Heron, MD   Patient coming from: Home   Chief Complaint: Cough, elevated BP   HPI: Kimberly [name] is a pleasant 82 y.o. female with medical history significant for hypertension, chronic HFpEF, CKD 3B, hyperlipidemia, hypothyroidism, and OSA on CPAP who presents with cough and elevated blood pressure.  Patient has productive cough that had improved with antibiotics roughly a month ago but has since returned and has been worsening.  She has not noted any fevers or chills, denies chest pain, and denies any change in her chronic bilateral lower leg swelling.  She did not take any of her medications today prior to coming in.  [place] ED Course: Upon arrival to the ED, patient is found to be afebrile and saturating well on room air with systolic blood pressure size 213.  CTA chest is negative for PE but notable for patchy airspace opacities in the bilateral bases.  Labs are most notable for creatinine 1.19, hemoglobin 9.2, BNP 231, and troponin 18 which increased to 42.  Cardiology was consulted by the ED PA and the patient was treated with Norvasc, oral hydralazine, losartan, IV labetalol, Rocephin, azithromycin, DuoNeb, IV Solu-Medrol, and 3 and 25 mg of aspirin in the ED.  She was transferred to [facility] for admission.  Review of Systems:  All other systems reviewed and apart from HPI, are negative.  Past Medical History:  Diagnosis Date   Acid reflux    Anxiety    Arthritis    Back spasm    Bradycardia    a. junctional and sinus brady during admission 06/2017 requiring cessation of beta blocker and clonidine.   Carotid artery stenosis    Carotid Dopplers 07/2021 showed 40 to 59% right ICA stenosis and 1 to 39% left ICA stenosis.  The right subclavian artery flow is disturbed.   Chronic diastolic heart failure (HCC) 03/20/2015   Chronic edema    CKD (chronic kidney disease),  stage III (HCC)    Depression    Diabetes mellitus without complication (HCC)    diet controled   Fuch's endothelial dystrophy    bilateral eyes   Headache    occ   HOH (hard of hearing)    wears hearing aid in left ear   Hypercholesteremia    Hypertensive heart disease with CHF (HCC) 09/11/2014   Insomnia    Junctional bradycardia    Mild mitral regurgitation    Mild tricuspid regurgitation    Normal coronary arteries 2010   a. 2010 cath with no obstructive epicardial disease.  Done for CP which was felt to be due to microvascular angina or diastolic HF.   OSA on CPAP    Followed by Dr. Fannie Knee   Patient's other noncompliance with medication regimen    per notes, gets confused easily with medications, difficult compliance     Past Surgical History:  Procedure Laterality Date   ABDOMINAL HYSTERECTOMY     APPENDECTOMY     CARDIAC CATHETERIZATION     no CAD   CESAREAN SECTION     x 2   CHOLECYSTECTOMY     COLONOSCOPY     EYE SURGERY Right    implant   ROTATOR CUFF REPAIR Left    SHOULDER ARTHROSCOPY WITH SUBACROMIAL DECOMPRESSION AND BICEP TENDON REPAIR Left 08/24/2012   Procedure: SHOULDER ARTHROSCOPY WITH SUBACROMIAL DECOMPRESSION AND BICEP TENDON REPAIR;  Surgeon: Cammy Copa, MD;  Location: [facility] OR;  Service: Orthopedics;  Laterality: Left;  Left Shoulder Arthroscopy, Debridement, Biceps Tenotomy   THYROIDECTOMY N/A 06/12/2015   Procedure: TOTAL THYROIDECTOMY;  Surgeon: Darnell Level, MD;  Location: [facility] OR;  Service: General;  Laterality: N/A;    Social History:   reports that she has never smoked. She has never used smokeless tobacco. She reports that she does not drink alcohol and does not use drugs.  Allergies  Allergen Reactions   Oxycodone-Acetaminophen Itching, Other (See Comments) and Shortness Of Breath    Pt couldn't breathe or catch her breath Pt couldn't breathe or catch her breath Pt couldn't breathe or catch her breath    Percocet  [Oxycodone-Acetaminophen] Shortness Of Breath and Other (See Comments)    Pt couldn't breathe or catch her breath   Codeine Other (See Comments) and Itching    hyperactivity hyperactivity   Other Other (See Comments)    Some pain medication given at [facility] [address] for surgery caused hallucinations    Promethazine Hcl Other (See Comments) and Nausea And Vomiting    Hallucinations  Hallucinations   Clonidine Derivatives     Bradycardia (06/2017)   Hydralazine Other (See Comments)    Headache/dizzy Headache/dizzy   Clonidine Palpitations    Bradycardia (06/2017)   Metoprolol Palpitations    Bradycardia 06/2017  Bradycardia 06/2017    Family History  Problem Relation Age of Onset   Stomach cancer Mother    Cancer Sister        intestinal     Prior to Admission medications   Medication Sig Start Date End Date Taking? Authorizing Provider  amLODipine (NORVASC) 10 MG tablet Take 1 tablet (10 mg total) by mouth daily. 12/30/22   Quintella Reichert, MD  aspirin 81 MG chewable tablet Chew 81 mg by mouth daily.    [provider]  atorvastatin (LIPITOR) 40 MG tablet TAKE ONE TABLET BY MOUTH ONCE DAILY 12/30/22   Quintella Reichert, MD  Cholecalciferol (VITAMIN D3) 5000 units CAPS Take 5,000 Units by mouth daily.    [provider]  cloNIDine (CATAPRES) 0.1 MG tablet Take 0.1 mg by mouth every 6 (six) hours as needed. If BP is over 180 12/21/19   [provider]  fenofibrate micronized (LOFIBRA) 200 MG capsule Take 200 mg by mouth daily before breakfast.    [provider]  hydrALAZINE (APRESOLINE) 50 MG tablet TAKE TWO TABLETS BY MOUTH three times daily *Dr only approved 15DS. Please keep appt scheduled for further refills! 12/30/22   Quintella Reichert, MD  levothyroxine (SYNTHROID) 137 MCG tablet TAKE ONE TABLET BY MOUTH BEFORE BREAKFAST 09/04/22   Carlus Pavlov, MD  losartan (COZAAR) 50 MG tablet Take 1 tablet (50 mg total) by mouth daily. Please keep  upcoming appt for future refills. Thank you 12/30/22   Quintella Reichert, MD  nitroGLYCERIN (NITROSTAT) 0.4 MG SL tablet Place 1 tablet (0.4 mg total) under the tongue every 5 (five) minutes as needed for chest pain. 07/31/21   Quintella Reichert, MD  PARoxetine (PAXIL) 40 MG tablet 1.5 tablets    [provider]    Physical Exam: Vitals:   01/18/23 1500 01/18/23 1650 01/18/23 1900 01/18/23 2030  BP: (!) 203/62 (!) 213/67 (!) 186/59 (!) 190/61  Pulse: 64 65 (!) 58 65  Resp: (!) 23 18 (!) 21 15  Temp:  98.5 F (36.9 C)  99.2 F (37.3 C)  TempSrc:  Oral  Oral  SpO2: 97% 94% 94% 94%  Constitutional: NAD, calm  Eyes: PERTLA, lids and conjunctivae normal ENMT: Mucous membranes are moist. Posterior pharynx clear of any exudate or lesions.   Neck: supple, no masses  Respiratory: Coarse rhonchi bilaterally. No wheezing, no crackles. No accessory muscle use.  Cardiovascular: S1 & S2 heard, regular rate and rhythm. No JVD. Abdomen: No distension, no tenderness, soft. Bowel sounds active.  Musculoskeletal: no clubbing / cyanosis. No joint deformity upper and lower extremities.   Skin: no significant rashes, lesions, ulcers. Warm, dry, well-perfused. Neurologic: Gross hearing loss, CN 2-12 grossly intact otherwise. Moving all extremities. Alert and oriented.  Psychiatric: Pleasant. Cooperative.    Labs and Imaging on Admission: I have personally reviewed following labs and imaging studies  CBC: Recent Labs  Lab 01/18/23 1416  WBC 4.6  NEUTROABS 3.4  HGB 9.2*  HCT 27.7*  MCV 88.8  PLT 175   Basic Metabolic Panel: Recent Labs  Lab 01/18/23 1416  NA 138  K 3.6  CL 105  CO2 25  GLUCOSE 175*  BUN 32*  CREATININE 1.19*  CALCIUM 9.3   GFR: CrCl cannot be calculated (Unknown ideal weight.). Liver Function Tests: Recent Labs  Lab 01/18/23 1416  AST 26  ALT 15  ALKPHOS 33*  BILITOT 0.4  PROT 6.3*  ALBUMIN 3.5   No results for input(s): "LIPASE", "AMYLASE" in the  last 168 hours. No results for input(s): "AMMONIA" in the last 168 hours. Coagulation Profile: No results for input(s): "INR", "PROTIME" in the last 168 hours. Cardiac Enzymes: No results for input(s): "CKTOTAL", "CKMB", "CKMBINDEX", "TROPONINI" in the last 168 hours. BNP (last 3 results) No results for input(s): "PROBNP" in the last 8760 hours. HbA1C: No results for input(s): "HGBA1C" in the last 72 hours. CBG: No results for input(s): "GLUCAP" in the last 168 hours. Lipid Profile: No results for input(s): "CHOL", "HDL", "LDLCALC", "TRIG", "CHOLHDL", "LDLDIRECT" in the last 72 hours. Thyroid Function Tests: No results for input(s): "TSH", "T4TOTAL", "FREET4", "T3FREE", "THYROIDAB" in the last 72 hours. Anemia Panel: No results for input(s): "VITAMINB12", "FOLATE", "FERRITIN", "TIBC", "IRON", "RETICCTPCT" in the last 72 hours. Urine analysis:    Component Value Date/Time   COLORURINE YELLOW 05/10/2019 1332   APPEARANCEUR CLOUDY (A) 05/10/2019 1332   LABSPEC 1.010 05/10/2019 1332   PHURINE 7.0 05/10/2019 1332   GLUCOSEU NEGATIVE 05/10/2019 1332   HGBUR NEGATIVE 05/10/2019 1332   BILIRUBINUR NEGATIVE 05/10/2019 1332   KETONESUR NEGATIVE 05/10/2019 1332   PROTEINUR 100 (A) 05/10/2019 1332   NITRITE NEGATIVE 05/10/2019 1332   LEUKOCYTESUR LARGE (A) 05/10/2019 1332   Sepsis Labs: @LABRCNTIP (procalcitonin:4,lacticidven:4) ) Recent Results (from the past 240 hour(s))  SARS Coronavirus 2 by RT PCR (hospital order, performed in [facility] hospital lab) *cepheid single result test* Anterior Nasal Swab     Status: None   Collection Time: 01/18/23  2:49 PM   Specimen: Anterior Nasal Swab  Result Value Ref Range Status   SARS Coronavirus 2 by RT PCR NEGATIVE NEGATIVE Final    Comment: (NOTE) SARS-CoV-2 target nucleic acids are NOT DETECTED.  The SARS-CoV-2 RNA is generally detectable in upper and lower respiratory specimens during the acute phase of infection. The  lowest concentration of SARS-CoV-2 viral copies this assay can detect is 250 copies / mL. A negative result does not preclude SARS-CoV-2 infection and should not be used as the sole basis for treatment or other patient management decisions.  A negative result may occur with improper specimen collection / handling, submission of specimen other than nasopharyngeal swab,  presence of viral mutation(s) within the areas targeted by this assay, and inadequate number of viral copies (<250 copies / mL). A negative result must be combined with clinical observations, patient history, and epidemiological information.  Fact Sheet for Patients:   RoadLapTop.co.za  Fact Sheet for Healthcare Providers: http://kim-miller.com/  This test is not yet approved or  cleared by the [place] FDA and has been authorized for detection and/or diagnosis of SARS-CoV-2 by FDA under an Emergency Use Authorization (EUA).  This EUA will remain in effect (meaning this test can be used) for the duration of the COVID-19 declaration under Section 564(b)(1) of the Act, 21 U.S.C. section 360bbb-3(b)(1), unless the authorization is terminated or revoked sooner.  Performed at [facility], [address] [place], [place] [address]      Radiological Exams on Admission: CT Angio Chest PE W and/or Wo Contrast  Result Date: 01/18/2023 CLINICAL DATA:  Increased respiratory rate.  Cough. EXAM: CT ANGIOGRAPHY CHEST WITH CONTRAST TECHNIQUE: Multidetector CT imaging of the chest was performed using the standard protocol during bolus administration of intravenous contrast. Multiplanar CT image reconstructions and MIPs were obtained to evaluate the vascular anatomy. RADIATION DOSE REDUCTION: This exam was performed according to the departmental dose-optimization program which includes automated exposure control, adjustment of the mA and/or kV according to patient size and/or  use of iterative reconstruction technique. CONTRAST:  75mL OMNIPAQUE IOHEXOL 350 MG/ML SOLN COMPARISON:  None Available. FINDINGS: Cardiovascular: Satisfactory opacification of the pulmonary arteries to the segmental level. No evidence of pulmonary embolism. Enlarged heart size. Small pericardial effusion. Mediastinum/Nodes: No enlarged mediastinal, hilar, or axillary lymph nodes. Shotty mediastinal lymph nodes. Thyroid gland, trachea, and esophagus demonstrate no significant findings. Lungs/Pleura: Patchy peripheral airspace opacities in bilateral lower lobes. No pleural effusion. Upper Abdomen: No acute abnormality. Musculoskeletal: Spondylosis of the thoracic spine. Review of the MIP images confirms the above findings. IMPRESSION: 1. No evidence of pulmonary embolism. 2. Patchy peripheral airspace opacities in bilateral lower lobes, may represent aspiration or atypical pneumonia. 3. Cardiomegaly with small pericardial effusion. Electronically Signed   By: Ted Mcalpine M.D.   On: 01/18/2023 15:40   DG Chest Port 1 View  Result Date: 01/18/2023 CLINICAL DATA:  Persistent cough. EXAM: PORTABLE CHEST 1 VIEW COMPARISON:  December 11, 2022 FINDINGS: The heart size and mediastinal contours are within normal limits. Both lungs are clear. The visualized skeletal structures are unremarkable. IMPRESSION: No active disease. Electronically Signed   By: Ted Mcalpine M.D.   On: 01/18/2023 14:14    EKG: Independently reviewed. Sinus rhythm, T-wave inversions.   Assessment/Plan   1. Hypertensive crisis  - SBP as high as 213 in ED  - Continue Norvasc, losartan, and hydralazine, use IV labetalol as needed    2. Elevated troponin  - HS troponin went from 18 to 42 in ED without chest pain  - Continue cardiac monitoring, trend troponin, repeat EKG, follow-up cardiology recommendations    3. Pneumonia  - Check strep pneumo and legionella antigens, continue Rocephin and azithromycin, check/trend  procalcitonin   4. Chronic HFpEF  - Appears compensated  - Monitor volume status    5. CKD 3A  - Appears close to baseline  - Renally-dose medications, monitor    6. OSA  - CPAP while sleeping    7. Anemia  - Hgb is 9.2 on admission, down from 11.5 in 2021  - No overt bleeding, repeat CBC in am    8. Hyperlipidemia  - Continue  Lipitor    DVT prophylaxis: sq heparin  Code Status: Full  Level of Care: Level of care: Progressive Family Communication: none present  Disposition Plan:  Patient is from: home  Anticipated d/c is to: Home  Anticipated d/c date is: Possibly as early as 9/9 or 01/20/23  Patient currently: Pending BP-control, repeat troponin and EKG  Consults called: Cardiology  Admission status: Observation     Briscoe Deutscher, MD [facility]  01/18/2023, 11:16 PM

## 2023-01-18 NOTE — ED Notes (Signed)
Called [facility] for transport at 6:59.

## 2023-01-18 NOTE — Plan of Care (Signed)
Brief Cardiology Review   Received call from [facility] ED regarding rising troponins (18 -> 42) in the setting of hypertensive urgency (203-213/62-67) and ongoing pnuemonia. Patient not having chest pain, but mild tightness that improved with nebulizer. Initial ECG at [facility] ED with twi in V1-V2. Review of CT chest reveals 3v coronary artery calcifications. Last NM stress test in 2021 was negative for ischemia, but with 2mm ST segment depressions.  Overall, suspect this represents demand ischemia, but would benefit from formal cardiology consultation if transferred to [place].  Recommend repeat troponin 2 hours after 2nd and continued trending of ECG. If continued significant rise in troponins, recommend heparin gtt and treating further as type 1 nstemi.   Achille Rich, MD  Overnight Cardiology Fellow

## 2023-01-18 NOTE — ED Provider Notes (Signed)
EMERGENCY DEPARTMENT AT [facility] Provider Note   CSN: [id number] Arrival date & time: 01/18/23  1312     History  Chief Complaint  Patient presents with   Hypertension    Kimberly [name] is a 82 y.o. female w/ pmhx of GERD, hyperlipidemia, hypertension, chronic kidney disease presenting for about 2 months of cough.  She was previously diagnosed with pneumonia and treated with antibiotics.  She thinks that her cough improved but about 3 weeks ago slowly started to worsen again.  Cough is productive of green sputum.  Patient is unsure what antibiotic she is specifically tried for this cough.  Patient reports she has not had any medications today which is why her blood pressure is elevated.  Denies weakness ,shortness of breath, vision changes, chest pain, nausea vomiting diarrhea.  Patient is not on blood thinner and has not had history of clot.  The patient has been seen at urgent care and with family medicine she is unsure what medication she has tried.   Hypertension       Home Medications Prior to Admission medications   Medication Sig Start Date End Date Taking? Authorizing Provider  amLODipine (NORVASC) 10 MG tablet Take 1 tablet (10 mg total) by mouth daily. 12/30/22   Quintella Reichert, MD  aspirin 81 MG chewable tablet Chew 81 mg by mouth daily.    [provider]  atorvastatin (LIPITOR) 40 MG tablet TAKE ONE TABLET BY MOUTH ONCE DAILY 12/30/22   Quintella Reichert, MD  Cholecalciferol (VITAMIN D3) 5000 units CAPS Take 5,000 Units by mouth daily.    [provider]  cloNIDine (CATAPRES) 0.1 MG tablet Take 0.1 mg by mouth every 6 (six) hours as needed. If BP is over 180 12/21/19   [provider]  fenofibrate micronized (LOFIBRA) 200 MG capsule Take 200 mg by mouth daily before breakfast.    [provider]  hydrALAZINE (APRESOLINE) 50 MG tablet TAKE TWO TABLETS BY MOUTH three times daily *Dr only approved 15DS. Please keep  appt scheduled for further refills! 12/30/22   Quintella Reichert, MD  levothyroxine (SYNTHROID) 137 MCG tablet TAKE ONE TABLET BY MOUTH BEFORE BREAKFAST 09/04/22   Carlus Pavlov, MD  losartan (COZAAR) 50 MG tablet Take 1 tablet (50 mg total) by mouth daily. Please keep upcoming appt for future refills. Thank you 12/30/22   Quintella Reichert, MD  nitroGLYCERIN (NITROSTAT) 0.4 MG SL tablet Place 1 tablet (0.4 mg total) under the tongue every 5 (five) minutes as needed for chest pain. 07/31/21   Quintella Reichert, MD  PARoxetine (PAXIL) 40 MG tablet 1.5 tablets    [provider]      Allergies    Oxycodone-acetaminophen, Percocet [oxycodone-acetaminophen], Codeine, Other, Promethazine hcl, Clonidine derivatives, Hydralazine, Clonidine, and Metoprolol    Review of Systems   Review of Systems  Respiratory:  Positive for cough.     Physical Exam Updated Vital Signs BP (!) 204/68   Pulse 65   Temp 97.6 F (36.4 C) (Axillary)   Resp (!) 21   SpO2 96%  Physical Exam Vitals and nursing note reviewed.  Constitutional:      General: She is not in acute distress.    Appearance: She is not toxic-appearing.  HENT:     Head: Normocephalic and atraumatic.  Eyes:     General: No scleral icterus.    Conjunctiva/sclera: Conjunctivae normal.  Cardiovascular:     Rate and Rhythm: Normal rate and  regular rhythm.     Pulses: Normal pulses.     Heart sounds: Normal heart sounds.  Pulmonary:     Effort: Pulmonary effort is normal. No respiratory distress.     Breath sounds: Wheezing present.  Chest:     Chest wall: Tenderness present.  Abdominal:     General: Abdomen is flat. Bowel sounds are normal.     Palpations: Abdomen is soft.     Tenderness: There is no abdominal tenderness.  Skin:    General: Skin is warm and dry.     Capillary Refill: Capillary refill takes less than 2 seconds.     Findings: No lesion.  Neurological:     General: No focal deficit present.     Mental Status: She  is alert and oriented to person, place, and time. Mental status is at baseline.     Cranial Nerves: No cranial nerve deficit.     Sensory: No sensory deficit.     Motor: No weakness.     ED Results / Procedures / Treatments   Labs (all labs ordered are listed, but only abnormal results are displayed) Labs Reviewed - No data to display  EKG None  Radiology No results found.  Procedures Procedures    Medications Ordered in ED Medications  albuterol (VENTOLIN HFA) 108 (90 Base) MCG/ACT inhaler 2 puff (has no administration in time range)    ED Course/ Medical Decision Making/ A&P                                 Medical Decision Making Amount and/or Complexity of Data Reviewed Labs: ordered. Radiology: ordered.  Risk OTC drugs. Prescription drug management. Decision regarding hospitalization.   This patient presents to the ED for concern of HTN and cough, this involves an extensive number of treatment options, and is a complaint that carries with it a high risk of complications and morbidity.  The differential diagnosis includes hypertensive urgency, hypertensive emergency, uncontrolled hypertension, pneumonia, pleural effusion, pericarditis, myocarditis, ACS, congestive heart failure, GERD, URI   Co morbidities that complicate the patient evaluation  HTN HLD CKD GERD   Additional history obtained:  Additional history obtained from daughter at bedside  External records from outside source obtained and reviewed including chart review    Lab Tests:  I Ordered, and personally interpreted labs.  The pertinent results include:   Cbc Hgb 9.2, wbc 4.6  Bmp glucose 174, BUN 32, Cr 1.19  Bnp 230 Trop 18, repeat Trop is up trending now 42, getting third repeat troponin per cards   Imaging Studies ordered:  I ordered imaging studies including chest xray and CTA  I independently visualized and interpreted imaging which showed possible lower lobe infiltrates  suggesting atypical pneumonia.  No sign of PE.  No pleural effusion. I agree with the radiologist interpretation   Cardiac Monitoring: / EKG:  The patient was maintained on a cardiac monitor.  I personally viewed and interpreted the cardiac monitored which showed an underlying rhythm of:    Consultations Obtained:  Consulted cardiology who believes the uptrending troponin is likely result of demand ischemia.  They agreed to see patient once they have been admitted and arrived to hospital.  Cards recommended repeat EKG, repeat troponin in 2 hours. Possible Heparin if Trop continues to uptrend.  Consulted hospitalist to request admission for pneumonia and HTN. Agreed to admission to hospital.    Problem List / ED  Course / Critical interventions / Medication management  Presenting to the emergency room with uncontrolled hypertension and cough.  Patient states she has not taken her hypertension medication today.  She has had the cough for approximately 2 months and been seen at other facilities.  She is unsure of the medication she was prescribed or what she has tried.  She is not reporting any other symptoms and physical exam shows wheezing but no focal neurosymptoms.  Her lab work is consistent with baseline.  Imaging suggest atypical pneumonia, no PE, no mass.  Patient's white blood cell count is normal.  She does not have COVID.  BNP is 230 which is lower than last BNP.   Given that this patient's blood pressure is not responding to p.o. meds or labetalol IV and up trending TROP, consulted cardiology.  Patient has had uptrending troponin as well with commitment bilateral pneumonia will admit to hospital. Started IV labetalol and ASA, started IV Abx for PNA I ordered medication including DuoNeb, steriods, at home PO BP medications   Reevaluation of the patient after these medicines showed that the patient improved I have reviewed the patients home medicines and have made adjustments as  needed   Plan Admit for HTN emergency v NSTEMI and Pneumonia          Final Clinical Impression(s) / ED Diagnoses Final diagnoses:  None    Rx / DC Orders ED Discharge Orders     None         Smitty Knudsen, PA-C 01/18/23 1845    Jacalyn Lefevre, MD 01/21/23 [phone]

## 2023-01-19 ENCOUNTER — Other Ambulatory Visit: Payer: Self-pay

## 2023-01-19 ENCOUNTER — Inpatient Hospital Stay (HOSPITAL_COMMUNITY): Payer: Medicare PPO

## 2023-01-19 DIAGNOSIS — Z1152 Encounter for screening for COVID-19: Secondary | ICD-10-CM | POA: Diagnosis not present

## 2023-01-19 DIAGNOSIS — N1832 Chronic kidney disease, stage 3b: Secondary | ICD-10-CM | POA: Diagnosis present

## 2023-01-19 DIAGNOSIS — I160 Hypertensive urgency: Secondary | ICD-10-CM | POA: Diagnosis present

## 2023-01-19 DIAGNOSIS — I3139 Other pericardial effusion (noninflammatory): Secondary | ICD-10-CM | POA: Diagnosis present

## 2023-01-19 DIAGNOSIS — R7989 Other specified abnormal findings of blood chemistry: Secondary | ICD-10-CM | POA: Diagnosis not present

## 2023-01-19 DIAGNOSIS — Z8701 Personal history of pneumonia (recurrent): Secondary | ICD-10-CM | POA: Diagnosis not present

## 2023-01-19 DIAGNOSIS — Z800 Family history of malignant neoplasm of digestive organs: Secondary | ICD-10-CM | POA: Diagnosis not present

## 2023-01-19 DIAGNOSIS — I5032 Chronic diastolic (congestive) heart failure: Secondary | ICD-10-CM | POA: Diagnosis present

## 2023-01-19 DIAGNOSIS — Z91130 Patient's unintentional underdosing of medication regimen due to age-related debility: Secondary | ICD-10-CM | POA: Diagnosis not present

## 2023-01-19 DIAGNOSIS — J40 Bronchitis, not specified as acute or chronic: Secondary | ICD-10-CM | POA: Diagnosis present

## 2023-01-19 DIAGNOSIS — E890 Postprocedural hypothyroidism: Secondary | ICD-10-CM | POA: Diagnosis present

## 2023-01-19 DIAGNOSIS — H9190 Unspecified hearing loss, unspecified ear: Secondary | ICD-10-CM | POA: Diagnosis present

## 2023-01-19 DIAGNOSIS — G4733 Obstructive sleep apnea (adult) (pediatric): Secondary | ICD-10-CM | POA: Diagnosis present

## 2023-01-19 DIAGNOSIS — E1122 Type 2 diabetes mellitus with diabetic chronic kidney disease: Secondary | ICD-10-CM | POA: Diagnosis present

## 2023-01-19 DIAGNOSIS — Z7982 Long term (current) use of aspirin: Secondary | ICD-10-CM | POA: Diagnosis not present

## 2023-01-19 DIAGNOSIS — T50916A Underdosing of multiple unspecified drugs, medicaments and biological substances, initial encounter: Secondary | ICD-10-CM | POA: Diagnosis present

## 2023-01-19 DIAGNOSIS — E7800 Pure hypercholesterolemia, unspecified: Secondary | ICD-10-CM | POA: Diagnosis present

## 2023-01-19 DIAGNOSIS — Z79890 Hormone replacement therapy (postmenopausal): Secondary | ICD-10-CM | POA: Diagnosis not present

## 2023-01-19 DIAGNOSIS — I272 Pulmonary hypertension, unspecified: Secondary | ICD-10-CM | POA: Diagnosis present

## 2023-01-19 DIAGNOSIS — I6529 Occlusion and stenosis of unspecified carotid artery: Secondary | ICD-10-CM | POA: Diagnosis present

## 2023-01-19 DIAGNOSIS — J189 Pneumonia, unspecified organism: Secondary | ICD-10-CM | POA: Diagnosis present

## 2023-01-19 DIAGNOSIS — I130 Hypertensive heart and chronic kidney disease with heart failure and stage 1 through stage 4 chronic kidney disease, or unspecified chronic kidney disease: Secondary | ICD-10-CM | POA: Diagnosis present

## 2023-01-19 DIAGNOSIS — Z888 Allergy status to other drugs, medicaments and biological substances status: Secondary | ICD-10-CM | POA: Diagnosis not present

## 2023-01-19 DIAGNOSIS — N1831 Chronic kidney disease, stage 3a: Secondary | ICD-10-CM | POA: Diagnosis not present

## 2023-01-19 DIAGNOSIS — Z79899 Other long term (current) drug therapy: Secondary | ICD-10-CM | POA: Diagnosis not present

## 2023-01-19 DIAGNOSIS — D631 Anemia in chronic kidney disease: Secondary | ICD-10-CM | POA: Diagnosis present

## 2023-01-19 LAB — BASIC METABOLIC PANEL
Anion gap: 10 (ref 5–15)
BUN: 26 mg/dL — ABNORMAL HIGH (ref 8–23)
CO2: 23 mmol/L (ref 22–32)
Calcium: 8.8 mg/dL — ABNORMAL LOW (ref 8.9–10.3)
Chloride: 106 mmol/L (ref 98–111)
Creatinine, Ser: 1.26 mg/dL — ABNORMAL HIGH (ref 0.44–1.00)
GFR, Estimated: 43 mL/min — ABNORMAL LOW (ref >60)
Glucose, Bld: 151 mg/dL — ABNORMAL HIGH (ref 70–99)
Potassium: 3.8 mmol/L (ref 3.5–5.1)
Sodium: 139 mmol/L (ref 135–145)

## 2023-01-19 LAB — CBC
HCT: 25.6 % — ABNORMAL LOW (ref 36.0–46.0)
Hemoglobin: 8.4 g/dL — ABNORMAL LOW (ref 12.0–15.0)
MCH: 29.1 pg (ref 26.0–34.0)
MCHC: 32.8 g/dL (ref 30.0–36.0)
MCV: 88.6 fL (ref 80.0–100.0)
Platelets: 202 10*3/uL (ref 150–400)
RBC: 2.89 MIL/uL — ABNORMAL LOW (ref 3.87–5.11)
RDW: 14.6 % (ref 11.5–15.5)
WBC: 5.4 10*3/uL (ref 4.0–10.5)
nRBC: 0 % (ref 0.0–0.2)

## 2023-01-19 LAB — ECHOCARDIOGRAM COMPLETE
Area-P 1/2: 4.06 cm2
Calc EF: 62.1 %
Height: 60 in
S' Lateral: 2.9 cm
Single Plane A2C EF: 66 %
Single Plane A4C EF: 59.9 %
Weight: 2128 [oz_av]

## 2023-01-19 LAB — TROPONIN I (HIGH SENSITIVITY)
Troponin I (High Sensitivity): 72 ng/L — ABNORMAL HIGH (ref <18)
Troponin I (High Sensitivity): 48 ng/L — ABNORMAL HIGH (ref <18)

## 2023-01-19 LAB — MAGNESIUM: Magnesium: 1.8 mg/dL (ref 1.7–2.4)

## 2023-01-19 LAB — STREP PNEUMONIAE URINARY ANTIGEN: Strep Pneumo Urinary Antigen: NEGATIVE

## 2023-01-19 LAB — PROCALCITONIN: Procalcitonin: <0.1 ng/mL

## 2023-01-19 MED ORDER — IRBESARTAN 300 MG PO TABS
300.0000 mg | ORAL_TABLET | Freq: Every day | ORAL | Status: DC
  Administered 2023-01-19 – 2023-01-20 (×2): 300 mg via ORAL
  Filled 2023-01-19 (×2): qty 1

## 2023-01-19 MED ORDER — CLONIDINE HCL 0.1 MG PO TABS
0.1000 mg | ORAL_TABLET | Freq: Four times a day (QID) | ORAL | Status: DC | PRN
  Administered 2023-01-19: 0.1 mg via ORAL
  Filled 2023-01-19: qty 1

## 2023-01-19 MED ORDER — CHLORTHALIDONE 25 MG PO TABS
25.0000 mg | ORAL_TABLET | Freq: Every day | ORAL | Status: DC
  Administered 2023-01-19 – 2023-01-20 (×2): 25 mg via ORAL
  Filled 2023-01-19 (×2): qty 1

## 2023-01-19 NOTE — Progress Notes (Signed)
Echocardiogram 2D Echocardiogram has been performed.  Kimberly [name] 01/19/2023, 4:00 PM

## 2023-01-19 NOTE — Consult Note (Signed)
Cardiology Consultation   Patient ID: Kimberly [name] MRN: [id number]; DOB: [birth year]  Admit date: 01/18/2023 Date of Consult: 01/19/2023  PCP:  Clayborn Heron, MD   [facility] Providers Cardiologist:  Armanda Magic, MD   {  Patient Profile:   Kimberly [name] is a 82 y.o. female with a hx of chronic HFpEF, normal coronary arteries on 2010 catheterization, chronic lower extremity edema, hypertension, junctional bradycardia, carotid artery stenosis, CKD, diabetes, hyperlipidemia, OSA on CPAP who is being seen 01/19/2023 for the evaluation of hypertensive urgency/elevated troponins at the request of Dr. Tyson Babinski.  History of Present Illness:   Ms. Trabold has history of normal coronary anatomy based off 2010 catheterization.  Chest pain at that time felt likely more related to acute HFpEF versus microvascular angina.  She also had a stress test in September 2021 that was normal, but did note 2mm ST depression.  Her last echocardiogram showed normal EF 55 to 60% with grade 2 diastolic dysfunction, mildly elevated pulmonary arterial systolic pressure.  RVSP 44.  Moderate dilatation of both atria.  This has remained stable throughout the years.  She also has history of junctional bradycardia in February 2019.  Her chest pressure was felt to be due to her junctional bradycardia and the clonidine and metoprolol were stopped.  Her blood pressure has been a significant challenge of the years with documentation at least going back to 2021.  She had issues with hydralazine secondary to headaches/dizziness.  Amlodipine was also an issue due to chronic lower extremity edema and was being maintained on reduced dosing.  Did not tolerate chlorthalidone to rising creatinine.  She was last seen by [name] in February 2023 the same issue with trying to control her blood pressure.  She did not report any significant symptoms at that time.  Currently, patient is being evaluated for hypertensive urgency as  well as recurrent pneumonia.  She has been treated for this on and off since June and reports not being entirely compliant with her antibiotics.  On presentation she had blood pressures anywhere between 200-213 systolic.  She also had some minimal elevation in troponins 18-42-72-48.  CT angio of the chest was negative for PE, showed patchy peripheral airspace representing likely atypical pneumonia, cardiomegaly with small pericardial effusion.  Creatinine 1.26.  BNP 230.  EKG shows sinus bradycardia, heart rate 55.  No acute ST-T wave changes.  Today patient is accompanied with her daughter as well as granddaughter.  She is extremely hard of hearing.  Patient denies any chest pain, significant shortness of breath, decreased exercise tolerance, fatigue, dizziness, headache.  She does chronically suffer from chronic lower extremity which she states that she takes Lasix 20 mg daily.  She does report mild shortness of breath with long distance ambulation without.  However this seems to be chronic with no significant changes recently.  She report 100% compliancy with her medications however her daughter questions this.  She lives at home alone.  She does not have a blood pressure cuff and does not take it regularly.  Past Medical History:  Diagnosis Date   Acid reflux    Anxiety    Arthritis    Back spasm    Bradycardia    a. junctional and sinus brady during admission 06/2017 requiring cessation of beta blocker and clonidine.   Carotid artery stenosis    Carotid Dopplers 07/2021 showed 40 to 59% right ICA stenosis and 1 to 39% left ICA stenosis.  The right  subclavian artery flow is disturbed.   Chronic diastolic heart failure (HCC) 03/20/2015   Chronic edema    CKD (chronic kidney disease), stage III (HCC)    Depression    Diabetes mellitus without complication (HCC)    diet controled   Fuch's endothelial dystrophy    bilateral eyes   Headache    occ   HOH (hard of hearing)    wears hearing aid in  left ear   Hypercholesteremia    Hypertensive heart disease with CHF (HCC) 09/11/2014   Insomnia    Junctional bradycardia    Mild mitral regurgitation    Mild tricuspid regurgitation    Normal coronary arteries 2010   a. 2010 cath with no obstructive epicardial disease.  Done for CP which was felt to be due to microvascular angina or diastolic HF.   OSA on CPAP    Followed by Dr. Fannie Knee   Patient's other noncompliance with medication regimen    per notes, gets confused easily with medications, difficult compliance     Past Surgical History:  Procedure Laterality Date   ABDOMINAL HYSTERECTOMY     APPENDECTOMY     CARDIAC CATHETERIZATION     no CAD   CESAREAN SECTION     x 2   CHOLECYSTECTOMY     COLONOSCOPY     EYE SURGERY Right    implant   ROTATOR CUFF REPAIR Left    SHOULDER ARTHROSCOPY WITH SUBACROMIAL DECOMPRESSION AND BICEP TENDON REPAIR Left 08/24/2012   Procedure: SHOULDER ARTHROSCOPY WITH SUBACROMIAL DECOMPRESSION AND BICEP TENDON REPAIR;  Surgeon: Cammy Copa, MD;  Location: [facility] OR;  Service: Orthopedics;  Laterality: Left;  Left Shoulder Arthroscopy, Debridement, Biceps Tenotomy   THYROIDECTOMY N/A 06/12/2015   Procedure: TOTAL THYROIDECTOMY;  Surgeon: Darnell Level, MD;  Location: [facility] OR;  Service: General;  Laterality: N/A;     Inpatient Medications: Scheduled Meds:  amLODipine  10 mg Oral Daily   aspirin  81 mg Oral Daily   atorvastatin  40 mg Oral Daily   azithromycin  500 mg Oral Daily   heparin  5,000 Units Subcutaneous Q8H   hydrALAZINE  50 mg Oral Q8H   levothyroxine  137 mcg Oral Q0600   losartan  50 mg Oral Daily   PARoxetine  40 mg Oral Daily   sodium chloride flush  3 mL Intravenous Q12H   Continuous Infusions:  cefTRIAXone (ROCEPHIN)  IV     PRN Meds: acetaminophen **OR** acetaminophen, albuterol, cloNIDine, guaiFENesin, labetalol, ondansetron **OR** ondansetron (ZOFRAN) IV, senna-docusate  Allergies:    Allergies  Allergen  Reactions   Oxycodone-Acetaminophen Itching, Other (See Comments) and Shortness Of Breath    Pt couldn't breathe or catch her breath Pt couldn't breathe or catch her breath Pt couldn't breathe or catch her breath    Percocet [Oxycodone-Acetaminophen] Shortness Of Breath and Other (See Comments)    Pt couldn't breathe or catch her breath   Codeine Other (See Comments) and Itching    hyperactivity hyperactivity   Other Other (See Comments)    Some pain medication given at [facility] for surgery caused hallucinations    Promethazine Hcl Other (See Comments) and Nausea And Vomiting    Hallucinations  Hallucinations   Clonidine Derivatives     Bradycardia (06/2017)   Hydralazine Other (See Comments)    Headache/dizzy Headache/dizzy   Clonidine Palpitations    Bradycardia (06/2017)   Metoprolol Palpitations    Bradycardia 06/2017  Bradycardia 06/2017    Social History:  Social History   Socioeconomic History   Marital status: Married    Spouse name: Not on file   Number of children: 2   Years of education: Not on file   Highest education level: Not on file  Occupational History   Occupation: Radiographer, therapeutic  Tobacco Use   Smoking status: Never   Smokeless tobacco: Never  Vaping Use   Vaping status: Never Used  Substance and Sexual Activity   Alcohol use: No    Alcohol/week: 0.0 standard drinks of alcohol   Drug use: No   Sexual activity: Not on file  Other Topics Concern   Not on file  Social History Narrative   Not on file   Social Determinants of Health   Financial Resource Strain: Not on file  Food Insecurity: No Food Insecurity (01/18/2023)   Hunger Vital Sign    Worried About Running Out of Food in the Last Year: Never true    Ran Out of Food in the Last Year: Never true  Transportation Needs: No Transportation Needs (01/18/2023)   PRAPARE - Administrator, Civil Service (Medical): No    Lack of Transportation (Non-Medical): No   Physical Activity: Not on file  Stress: Not on file  Social Connections: Not on file  Intimate Partner Violence: Not At Risk (01/18/2023)   Humiliation, Afraid, Rape, and Kick questionnaire    Fear of Current or Ex-Partner: No    Emotionally Abused: No    Physically Abused: No    Sexually Abused: No    Family History:    Family History  Problem Relation Age of Onset   Stomach cancer Mother    Cancer Sister        intestinal     ROS:  Please see the history of present illness.   All other ROS reviewed and negative.     Physical Exam/Data:   Vitals:   01/19/23 0449 01/19/23 0731 01/19/23 0801 01/19/23 1145  BP: (!) 160/51 (!) 184/58  (!) 181/54  Pulse: (!) 53 (!) 57 61 62  Resp: 18 18  (!) 24  Temp: 98.8 F (37.1 C) 98 F (36.7 C)  97.9 F (36.6 C)  TempSrc:  Oral    SpO2: 97% 96% 96% 96%  Weight: 60.3 kg     Height:        Intake/Output Summary (Last 24 hours) at 01/19/2023 1340 Last data filed at 01/19/2023 0843 Gross per 24 hour  Intake 217.15 ml  Output --  Net 217.15 ml      01/19/2023    4:49 AM 01/18/2023    8:30 PM 08/27/2022   10:57 AM  Last 3 Weights  Weight (lbs) 133 lb 133 lb 6.4 oz 141 lb 9.6 oz  Weight (kg) 60.328 kg 60.51 kg 64.229 kg     Body mass index is 25.97 kg/m.  General:  Well nourished, well developed, in no acute distress HEENT: normal Neck: no JVD Vascular: No carotid bruits; Distal pulses 2+ bilaterally Cardiac:  normal S1, S2; RRR; no murmur  Lungs: Wheezing, slight crackles in the bases Abd: soft, nontender, no hepatomegaly.  Ventral hernia appreciated with respirations Ext: Trace edema Musculoskeletal:  No deformities, BUE and BLE strength normal and equal Skin: warm and dry  Neuro:  CNs 2-12 intact, no focal abnormalities noted Psych:  Normal affect   EKG:  The EKG was personally reviewed and demonstrates: Sinus bradycardia, heart rate 55.  No acute ST-T wave changes. Telemetry:  Telemetry was  personally reviewed and  demonstrates: Sinus bradycardia with heart rates 50s to 60s  Relevant CV Studies: Echocardiogram 02/02/2020 1. Left ventricular ejection fraction, by estimation, is 60 to 65%. The  left ventricle has normal function. The left ventricle has no regional  wall motion abnormalities. Left ventricular diastolic parameters are  consistent with Grade II diastolic  dysfunction (pseudonormalization). Elevated left ventricular end-diastolic  pressure. The E/e' is 26.   2. Right ventricular systolic function is normal. The right ventricular  size is normal. There is mildly elevated pulmonary artery systolic  pressure. The estimated right ventricular systolic pressure is 44.0 mmHg.   3. Left atrial size was moderately dilated.   4. Right atrial size was moderately dilated.   5. The mitral valve is normal in structure. Trivial mitral valve  regurgitation. No evidence of mitral stenosis.   6. The aortic valve is normal in structure. Aortic valve regurgitation is  not visualized. No aortic stenosis is present.   7. The inferior vena cava is normal in size with greater than 50%  respiratory variability, suggesting right atrial pressure of 3 mmHg.   Laboratory Data:  High Sensitivity Troponin:   Recent Labs  Lab 01/18/23 1416 01/18/23 1643 01/18/23 2343 01/19/23 0542  TROPONINIHS 18* 42* 72* 48*     Chemistry Recent Labs  Lab 01/18/23 1416 01/19/23 0235  NA 138 139  K 3.6 3.8  CL 105 106  CO2 25 23  GLUCOSE 175* 151*  BUN 32* 26*  CREATININE 1.19* 1.26*  CALCIUM 9.3 8.8*  MG  --  1.8  GFRNONAA 46* 43*  ANIONGAP 8 10    Recent Labs  Lab 01/18/23 1416  PROT 6.3*  ALBUMIN 3.5  AST 26  ALT 15  ALKPHOS 33*  BILITOT 0.4   Lipids No results for input(s): "CHOL", "TRIG", "HDL", "LABVLDL", "LDLCALC", "CHOLHDL" in the last 168 hours.  Hematology Recent Labs  Lab 01/18/23 1416 01/19/23 0235  WBC 4.6 5.4  RBC 3.12* 2.89*  HGB 9.2* 8.4*  HCT 27.7* 25.6*  MCV 88.8 88.6  MCH 29.5  29.1  MCHC 33.2 32.8  RDW 14.6 14.6  PLT 175 202   Thyroid No results for input(s): "TSH", "FREET4" in the last 168 hours.  BNP Recent Labs  Lab 01/18/23 1416  BNP 230.6*    DDimer No results for input(s): "DDIMER" in the last 168 hours.   Radiology/Studies:  CT Angio Chest PE W and/or Wo Contrast  Result Date: 01/18/2023 CLINICAL DATA:  Increased respiratory rate.  Cough. EXAM: CT ANGIOGRAPHY CHEST WITH CONTRAST TECHNIQUE: Multidetector CT imaging of the chest was performed using the standard protocol during bolus administration of intravenous contrast. Multiplanar CT image reconstructions and MIPs were obtained to evaluate the vascular anatomy. RADIATION DOSE REDUCTION: This exam was performed according to the departmental dose-optimization program which includes automated exposure control, adjustment of the mA and/or kV according to patient size and/or use of iterative reconstruction technique. CONTRAST:  75mL OMNIPAQUE IOHEXOL 350 MG/ML SOLN COMPARISON:  None Available. FINDINGS: Cardiovascular: Satisfactory opacification of the pulmonary arteries to the segmental level. No evidence of pulmonary embolism. Enlarged heart size. Small pericardial effusion. Mediastinum/Nodes: No enlarged mediastinal, hilar, or axillary lymph nodes. Shotty mediastinal lymph nodes. Thyroid gland, trachea, and esophagus demonstrate no significant findings. Lungs/Pleura: Patchy peripheral airspace opacities in bilateral lower lobes. No pleural effusion. Upper Abdomen: No acute abnormality. Musculoskeletal: Spondylosis of the thoracic spine. Review of the MIP images confirms the above findings. IMPRESSION: 1. No evidence of pulmonary embolism.  2. Patchy peripheral airspace opacities in bilateral lower lobes, may represent aspiration or atypical pneumonia. 3. Cardiomegaly with small pericardial effusion. Electronically Signed   By: Ted Mcalpine M.D.   On: 01/18/2023 15:40   DG Chest Port 1 View  Result Date:  01/18/2023 CLINICAL DATA:  Persistent cough. EXAM: PORTABLE CHEST 1 VIEW COMPARISON:  December 11, 2022 FINDINGS: The heart size and mediastinal contours are within normal limits. Both lungs are clear. The visualized skeletal structures are unremarkable. IMPRESSION: No active disease. Electronically Signed   By: Ted Mcalpine M.D.   On: 01/18/2023 14:14     Assessment and Plan:   Hypertensive urgency with elevated troponins Currently being treated for recurrent pneumonia.  Chronically has uncontrolled blood pressure has been difficult to manage throughout the years.  Initially presented with blood pressures in the 200s now slightly improved 160s to 180s.  EKG without any acute ST-T wave changes.  She also has normal cardiac catheterization in 2010 followed by normal Lexiscan in 2021.  Troponins have been relatively flat 18-42-72-48.  Not consistent with ACS.  Denies any chest pain or any other cardiac related symptoms.  She does well at home and not functionally limited. Currently she is on amlodipine 10 mg (had issues with swelling previously), hydralazine 50 mg every 8 (had issues with dizziness previously), losartan 50 mg, also has IV labetalol/clonidine as needed.   Echocardiogram pending Given persistently elevated blood pressure may want to consider renal ultrasound or other secondary evaluations. TSH 4 months ago was 108. Will repeat.  We discussed in definitely the need for a blood pressure cuff which she does not have at home.  He to facilitate this at discharge.  She will start taking twice a day and document this   Chronic HFpEF Last echocardiogram was in September 2021 that showed EF 60 to 65%.  Grade 2 diastolic dysfunction.  E over E prime is 26.  Normal RV function with RVSP 44.  Moderately dilated atria. Suspect pulmonary hypertension is related to her diastolic dysfunction.  Currently on losartan 50 mg.  Can consider spironolactone for further titration of GDMT as well as better  blood pressure control. PTA PO lasix 20mg  daily based on previous office note.   OSA on CPAP Pneumonia Anemia  Hypothyroidism Per primary team.   Risk Assessment/Risk Scores:  New York Heart Association (NYHA) Functional Class NYHA Class II    For questions or updates, please contact [facility] Please consult [email or web address] for contact info under    Signed, Abagail Kitchens, PA-C  01/19/2023 1:40 PM

## 2023-01-19 NOTE — Progress Notes (Signed)
PROGRESS NOTE    Kimberly [name]  XBM:[id number] DOB: 1940-10-16 DOA: 01/18/2023 PCP: Clayborn Heron, MD    Brief Narrative:  Kimberly [name] is a 82 y.o. female with medical history significant for hypertension, chronic HFpEF, CKD 3b, hyperlipidemia, hypothyroidism, and OSA on CPAP who presented to the hospital with productive cough and elevated blood pressure.  Patient stated that there was some initial improvement with antibiotic but cough again returned.  In the ED, patient was afebrile.  Blood pressure was elevated. CTA chest was negative for PE but notable for patchy airspace opacities in the bilateral bases.  Labs with creatinine 1.19, hemoglobin 9.2, BNP 231, and troponin 18 which increased to 42.  Cardiology was consulted from the ED and patient was out of the hospital for further evaluation treatment.  Assessment and plan.  Hypertensive urgency - Continue Norvasc, losartan, and hydralazine, use IV labetalol as needed.  Latest blood pressure of 181/54.  Will resume clonidine as needed every 6 hourly.   Elevated troponin  No chest pain with elevated troponins.  Cardiology was consulted. Continue telemetry monitoring.  Follow cardiology recommendation.  2D echocardiogram noted from 02/02/2020 with normal LV function with pulmonary hypertension.  Will check 2D echocardiogram  Possible basal pneumonia  -Negative for strep urinary antigen.  Pending legionella antigens, continue Rocephin and azithromycin, procalcitonin less than 0.1.  COVID-19 negative.  BNP elevated at 230.  No leukocytosis.   Chronic HFpEF  Continue losartan.   CKD 3a  Will continue to monitor.  Latest creatinine of 1.2.   OSA  - CPAP at bedtime.   Anemia of chronic disease - Hgb is 8.4  down from 11.5 in 2021 no evidence of bleeding.  Will continue to monitor  Hyperlipidemia  - Continue Lipitor     DVT prophylaxis: heparin injection 5,000 Units Start: 01/19/23 1400   Code Status:     Code Status: Full  Code  Disposition: Home likely in 1 to 2 days  Status is: Observation  The patient will require care spanning > 2 midnights and should be moved to inpatient because: Accelerated hypertension, pneumonia, IV antibiotic, cardiology consultation   Family Communication: None at bedside  Consultants:  Cardiology  Procedures:  None  Antimicrobials:  Rocephin and Zithromax  Anti-infectives (From admission, onward)    Start     Dose/Rate Route Frequency Ordered Stop   01/19/23 1800  cefTRIAXone (ROCEPHIN) 2 g in sodium chloride 0.9 % 100 mL IVPB        2 g 200 mL/hr over 30 Minutes Intravenous Every 24 hours 01/18/23 2249 01/23/23 1759   01/19/23 1800  azithromycin (ZITHROMAX) tablet 500 mg        500 mg Oral Daily 01/18/23 2249 01/23/23 0959   01/18/23 1745  cefTRIAXone (ROCEPHIN) 1 g in sodium chloride 0.9 % 100 mL IVPB        1 g 200 mL/hr over 30 Minutes Intravenous  Once 01/18/23 1744 01/18/23 1829   01/18/23 1745  azithromycin (ZITHROMAX) 500 mg in sodium chloride 0.9 % 250 mL IVPB        500 mg 250 mL/hr over 60 Minutes Intravenous  Once 01/18/23 1744 01/18/23 1932       Subjective: Today, patient was seen and examined at bedside.  Patient complains of cough with sputum production.  Feels short of breath but little better than yesterday.  Objective: Vitals:   01/19/23 0449 01/19/23 0731 01/19/23 0801 01/19/23 1145  BP: (!) 160/51 (!) 184/58  Marland Kitchen)  181/54  Pulse: (!) 53 (!) 57 61 62  Resp: 18 18  (!) 24  Temp: 98.8 F (37.1 C) 98 F (36.7 C)  97.9 F (36.6 C)  TempSrc:  Oral    SpO2: 97% 96% 96% 96%  Weight: 60.3 kg     Height:        Intake/Output Summary (Last 24 hours) at 01/19/2023 1324 Last data filed at 01/19/2023 0843 Gross per 24 hour  Intake 217.15 ml  Output --  Net 217.15 ml   Filed Weights   01/18/23 2030 01/19/23 0449  Weight: 60.5 kg 60.3 kg    Physical Examination: Body mass index is 25.97 kg/m.  General:  Average built, not in obvious  distress, hard of hearing HENT:   No scleral pallor or icterus noted. Oral mucosa is moist.  Chest:   Diminished breath sounds bilaterally.  Coarse breath sounds noted bilaterally. CVS: S1 &S2 heard. No murmur.  Regular rate and rhythm. Abdomen: Soft, nontender, nondistended.  Bowel sounds are heard.   Extremities: No cyanosis, clubbing or edema.  Peripheral pulses are palpable. Psych: Alert, awake and oriented, normal mood CNS:  No cranial nerve deficits.  Power equal in all extremities.   Skin: Warm and dry.  No rashes noted.  Data Reviewed:   CBC: Recent Labs  Lab 01/18/23 1416 01/19/23 0235  WBC 4.6 5.4  NEUTROABS 3.4  --   HGB 9.2* 8.4*  HCT 27.7* 25.6*  MCV 88.8 88.6  PLT 175 202    Basic Metabolic Panel: Recent Labs  Lab 01/18/23 1416 01/19/23 0235  NA 138 139  K 3.6 3.8  CL 105 106  CO2 25 23  GLUCOSE 175* 151*  BUN 32* 26*  CREATININE 1.19* 1.26*  CALCIUM 9.3 8.8*  MG  --  1.8    Liver Function Tests: Recent Labs  Lab 01/18/23 1416  AST 26  ALT 15  ALKPHOS 33*  BILITOT 0.4  PROT 6.3*  ALBUMIN 3.5     Radiology Studies: CT Angio Chest PE W and/or Wo Contrast  Result Date: 01/18/2023 CLINICAL DATA:  Increased respiratory rate.  Cough. EXAM: CT ANGIOGRAPHY CHEST WITH CONTRAST TECHNIQUE: Multidetector CT imaging of the chest was performed using the standard protocol during bolus administration of intravenous contrast. Multiplanar CT image reconstructions and MIPs were obtained to evaluate the vascular anatomy. RADIATION DOSE REDUCTION: This exam was performed according to the departmental dose-optimization program which includes automated exposure control, adjustment of the mA and/or kV according to patient size and/or use of iterative reconstruction technique. CONTRAST:  75mL OMNIPAQUE IOHEXOL 350 MG/ML SOLN COMPARISON:  None Available. FINDINGS: Cardiovascular: Satisfactory opacification of the pulmonary arteries to the segmental level. No evidence of  pulmonary embolism. Enlarged heart size. Small pericardial effusion. Mediastinum/Nodes: No enlarged mediastinal, hilar, or axillary lymph nodes. Shotty mediastinal lymph nodes. Thyroid gland, trachea, and esophagus demonstrate no significant findings. Lungs/Pleura: Patchy peripheral airspace opacities in bilateral lower lobes. No pleural effusion. Upper Abdomen: No acute abnormality. Musculoskeletal: Spondylosis of the thoracic spine. Review of the MIP images confirms the above findings. IMPRESSION: 1. No evidence of pulmonary embolism. 2. Patchy peripheral airspace opacities in bilateral lower lobes, may represent aspiration or atypical pneumonia. 3. Cardiomegaly with small pericardial effusion. Electronically Signed   By: Ted Mcalpine M.D.   On: 01/18/2023 15:40   DG Chest Port 1 View  Result Date: 01/18/2023 CLINICAL DATA:  Persistent cough. EXAM: PORTABLE CHEST 1 VIEW COMPARISON:  December 11, 2022 FINDINGS: The heart size and  mediastinal contours are within normal limits. Both lungs are clear. The visualized skeletal structures are unremarkable. IMPRESSION: No active disease. Electronically Signed   By: Ted Mcalpine M.D.   On: 01/18/2023 14:14      LOS: 0 days     Joycelyn Das, MD [facility] Available via Epic secure chat 7am-7pm After these hours, please refer to coverage provider listed on [email or web address] 01/19/2023, 1:24 PM

## 2023-01-20 DIAGNOSIS — N1831 Chronic kidney disease, stage 3a: Secondary | ICD-10-CM | POA: Diagnosis not present

## 2023-01-20 DIAGNOSIS — I160 Hypertensive urgency: Secondary | ICD-10-CM | POA: Diagnosis not present

## 2023-01-20 DIAGNOSIS — I5032 Chronic diastolic (congestive) heart failure: Secondary | ICD-10-CM | POA: Diagnosis not present

## 2023-01-20 DIAGNOSIS — R7989 Other specified abnormal findings of blood chemistry: Secondary | ICD-10-CM | POA: Diagnosis not present

## 2023-01-20 LAB — CBC
HCT: 25.8 % — ABNORMAL LOW (ref 36.0–46.0)
Hemoglobin: 8.3 g/dL — ABNORMAL LOW (ref 12.0–15.0)
MCH: 28.4 pg (ref 26.0–34.0)
MCHC: 32.2 g/dL (ref 30.0–36.0)
MCV: 88.4 fL (ref 80.0–100.0)
Platelets: 208 10*3/uL (ref 150–400)
RBC: 2.92 MIL/uL — ABNORMAL LOW (ref 3.87–5.11)
RDW: 14.8 % (ref 11.5–15.5)
WBC: 4.7 10*3/uL (ref 4.0–10.5)
nRBC: 0 % (ref 0.0–0.2)

## 2023-01-20 LAB — BASIC METABOLIC PANEL
Anion gap: 8 (ref 5–15)
BUN: 26 mg/dL — ABNORMAL HIGH (ref 8–23)
CO2: 25 mmol/L (ref 22–32)
Calcium: 8.7 mg/dL — ABNORMAL LOW (ref 8.9–10.3)
Chloride: 106 mmol/L (ref 98–111)
Creatinine, Ser: 1.27 mg/dL — ABNORMAL HIGH (ref 0.44–1.00)
GFR, Estimated: 42 mL/min — ABNORMAL LOW (ref >60)
Glucose, Bld: 91 mg/dL (ref 70–99)
Potassium: 3.7 mmol/L (ref 3.5–5.1)
Sodium: 139 mmol/L (ref 135–145)

## 2023-01-20 LAB — MAGNESIUM: Magnesium: 1.8 mg/dL (ref 1.7–2.4)

## 2023-01-20 LAB — PROCALCITONIN: Procalcitonin: <0.1 ng/mL

## 2023-01-20 LAB — LEGIONELLA PNEUMOPHILA SEROGP 1 UR AG: L. pneumophila Serogp 1 Ur Ag: NEGATIVE

## 2023-01-20 LAB — TSH: TSH: 3.414 u[IU]/mL (ref 0.350–4.500)

## 2023-01-20 LAB — HEMOGLOBIN A1C
Hgb A1c MFr Bld: 5.1 % (ref 4.8–5.6)
Mean Plasma Glucose: 99.67 mg/dL

## 2023-01-20 MED ORDER — HYDRALAZINE HCL 50 MG PO TABS: 100.0000 mg | ORAL_TABLET | Freq: Three times a day (TID) | ORAL | Status: DC

## 2023-01-20 MED ORDER — HYDRALAZINE HCL 50 MG PO TABS
50.0000 mg | ORAL_TABLET | Freq: Once | ORAL | Status: AC
  Administered 2023-01-20: 50 mg via ORAL
  Filled 2023-01-20: qty 1

## 2023-01-20 MED ORDER — CHLORTHALIDONE 25 MG PO TABS: 25.0000 mg | ORAL_TABLET | Freq: Every day | ORAL | 2 refills | Status: DC

## 2023-01-20 MED ORDER — DOXYCYCLINE HYCLATE 100 MG PO TABS
100.0000 mg | ORAL_TABLET | Freq: Two times a day (BID) | ORAL | 0 refills | Status: AC
Start: 2023-01-20 — End: 2023-01-23

## 2023-01-20 MED ORDER — GUAIFENESIN 100 MG/5ML PO LIQD: 5.0000 mL | ORAL | 0 refills | Status: DC | PRN

## 2023-01-20 MED ORDER — HYDRALAZINE HCL 100 MG PO TABS: 100.0000 mg | ORAL_TABLET | Freq: Three times a day (TID) | ORAL | 2 refills | Status: DC

## 2023-01-20 NOTE — Progress Notes (Signed)
Progress Note  Patient Name: Kimberly [name] Date of Encounter: 01/20/2023  Primary Cardiologist: Armanda Magic, MD  Subjective   Feeling a little better today, no acute complaints. Very hard of hearing. Daughter at bedside.  Inpatient Medications    Scheduled Meds:  amLODipine  10 mg Oral Daily   aspirin  81 mg Oral Daily   atorvastatin  40 mg Oral Daily   azithromycin  500 mg Oral Daily   chlorthalidone  25 mg Oral Daily   heparin  5,000 Units Subcutaneous Q8H   hydrALAZINE  100 mg Oral Q8H   irbesartan  300 mg Oral Daily   levothyroxine  137 mcg Oral Q0600   PARoxetine  40 mg Oral Daily   sodium chloride flush  3 mL Intravenous Q12H   Continuous Infusions:  cefTRIAXone (ROCEPHIN)  IV Stopped (01/19/23 1757)   PRN Meds: acetaminophen **OR** acetaminophen, albuterol, cloNIDine, guaiFENesin, labetalol, ondansetron **OR** ondansetron (ZOFRAN) IV, senna-docusate   Vital Signs    Vitals:   01/20/23 0115 01/20/23 0429 01/20/23 0853 01/20/23 1135  BP: (!) 164/47 (!) 184/64 (!) 180/61 (!) 166/62  Pulse: 60 (!) 52 62 65  Resp: 20 20 16 17   Temp: 98.6 F (37 C) 98.8 F (37.1 C) 98.7 F (37.1 C) 98.6 F (37 C)  TempSrc: Oral Oral Oral Oral  SpO2: 93% 94% 95% 95%  Weight:  60.8 kg    Height:        Intake/Output Summary (Last 24 hours) at 01/20/2023 1309 Last data filed at 01/20/2023 1304 Gross per 24 hour  Intake 343.85 ml  Output 950 ml  Net -606.15 ml      01/20/2023    4:29 AM 01/19/2023    4:49 AM 01/18/2023    8:30 PM  Last 3 Weights  Weight (lbs) 134 lb 133 lb 133 lb 6.4 oz  Weight (kg) 60.782 kg 60.328 kg 60.51 kg     Telemetry    NSR with episodes of junctional bradycardia with HR 50s, one short run SVT - Personally Reviewed  Physical Exam   GEN: No acute distress.  HEENT: Normocephalic, atraumatic, sclera non-icteric. Neck: No JVD or bruits. Cardiac: RRR no murmurs, rubs, or gallops.  Respiratory: Clear to auscultation bilaterally but productive  sounding cough. Breathing is unlabored. GI: Soft, nontender, non-distended, BS +x 4. MS: no deformity. Extremities: No clubbing or cyanosis. Trace soft puffy ankle edema. Distal pedal pulses are 2+ and equal bilaterally. Neuro:  AAOx3. Follows commands. Very hard of hearing. Psych:  Responds to questions appropriately with a normal affect.  Labs    High Sensitivity Troponin:   Recent Labs  Lab 01/18/23 1416 01/18/23 1643 01/18/23 2343 01/19/23 0542  TROPONINIHS 18* 42* 72* 48*      Cardiac EnzymesNo results for input(s): "TROPONINI" in the last 168 hours. No results for input(s): "TROPIPOC" in the last 168 hours.   Chemistry Recent Labs  Lab 01/18/23 1416 01/19/23 0235 01/20/23 0253  NA 138 139 139  K 3.6 3.8 3.7  CL 105 106 106  CO2 25 23 25   GLUCOSE 175* 151* 91  BUN 32* 26* 26*  CREATININE 1.19* 1.26* 1.27*  CALCIUM 9.3 8.8* 8.7*  PROT 6.3*  --   --   ALBUMIN 3.5  --   --   AST 26  --   --   ALT 15  --   --   ALKPHOS 33*  --   --   BILITOT 0.4  --   --  GFRNONAA 46* 43* 42*  ANIONGAP 8 10 8      Hematology Recent Labs  Lab 01/18/23 1416 01/19/23 0235 01/20/23 0253  WBC 4.6 5.4 4.7  RBC 3.12* 2.89* 2.92*  HGB 9.2* 8.4* 8.3*  HCT 27.7* 25.6* 25.8*  MCV 88.8 88.6 88.4  MCH 29.5 29.1 28.4  MCHC 33.2 32.8 32.2  RDW 14.6 14.6 14.8  PLT 175 202 208    BNP Recent Labs  Lab 01/18/23 1416  BNP 230.6*     DDimer No results for input(s): "DDIMER" in the last 168 hours.   Radiology    ECHOCARDIOGRAM COMPLETE  Result Date: 01/19/2023    ECHOCARDIOGRAM REPORT   Patient Name:   Kimberly [name] Date of Exam: 01/19/2023 Medical Rec #:  [id number]      Height:       60.0 in Accession #:    [id number]     Weight:       133.0 lb Date of Birth:  September 04, 1940      BSA:          1.569 m Patient Age:    82 years       BP:           181/54 mmHg Patient Gender: F              HR:           58 bpm. Exam Location:  Inpatient Procedure: 2D Echo, Cardiac Doppler and Color  Doppler Indications:    Elevated Troponin  History:        Patient has prior history of Echocardiogram examinations, most                 recent 02/02/2020. CHF, Arrythmias:Bradycardia; Risk                 Factors:Diabetes.  Sonographer:    Eulah Pont RDCS Referring Phys: Rebekah Chesterfield POKHREL IMPRESSIONS  1. Left ventricular ejection fraction, by estimation, is 60 to 65%. The left ventricle has normal function. The left ventricle has no regional wall motion abnormalities. There is mild concentric left ventricular hypertrophy. Left ventricular diastolic parameters are consistent with Grade II diastolic dysfunction (pseudonormalization).  2. Right ventricular systolic function is normal. The right ventricular size is normal. There is normal pulmonary artery systolic pressure.  3. Left atrial size was severely dilated.  4. Right atrial size was moderately dilated.  5. A small pericardial effusion is present. There is no evidence of cardiac tamponade.  6. The mitral valve is normal in structure. Trivial mitral valve regurgitation.  7. The aortic valve is tricuspid. Aortic valve regurgitation is not visualized.  8. The inferior vena cava is normal in size with greater than 50% respiratory variability, suggesting right atrial pressure of 3 mmHg. Comparison(s): No significant change from prior study. FINDINGS  Left Ventricle: Left ventricular ejection fraction, by estimation, is 60 to 65%. The left ventricle has normal function. The left ventricle has no regional wall motion abnormalities. The left ventricular internal cavity size was normal in size. There is  mild concentric left ventricular hypertrophy. Left ventricular diastolic parameters are consistent with Grade II diastolic dysfunction (pseudonormalization). Right Ventricle: The right ventricular size is normal. Right ventricular systolic function is normal. There is normal pulmonary artery systolic pressure. The tricuspid regurgitant velocity is 2.75 m/s, and with an  assumed right atrial pressure of 3 mmHg,  the estimated right ventricular systolic pressure is 33.2 mmHg. Left Atrium: Left atrial size was severely dilated. Right  Atrium: Right atrial size was moderately dilated. Pericardium: A small pericardial effusion is present. There is no evidence of cardiac tamponade. Mitral Valve: The mitral valve is normal in structure. Trivial mitral valve regurgitation. Tricuspid Valve: Tricuspid valve regurgitation is mild. Aortic Valve: The aortic valve is tricuspid. Aortic valve regurgitation is not visualized. Pulmonic Valve: Pulmonic valve regurgitation is not visualized. Aorta: The aortic root and ascending aorta are structurally normal, with no evidence of dilitation. Venous: The inferior vena cava is normal in size with greater than 50% respiratory variability, suggesting right atrial pressure of 3 mmHg. IAS/Shunts: No atrial level shunt detected by color flow Doppler.  LEFT VENTRICLE PLAX 2D LVIDd:         5.50 cm     Diastology LVIDs:         2.90 cm     LV e' medial:    5.35 cm/s LV PW:         1.10 cm     LV E/e' medial:  26.7 LV IVS:        1.10 cm     LV e' lateral:   6.57 cm/s LVOT diam:     1.90 cm     LV E/e' lateral: 21.8 LV SV:         102 LV SV Index:   65 LVOT Area:     2.84 cm  LV Volumes (MOD) LV vol d, MOD A2C: 95.6 ml LV vol d, MOD A4C: 90.1 ml LV vol s, MOD A2C: 32.5 ml LV vol s, MOD A4C: 36.1 ml LV SV MOD A2C:     63.1 ml LV SV MOD A4C:     90.1 ml LV SV MOD BP:      58.6 ml RIGHT VENTRICLE RV S prime:     15.20 cm/s TAPSE (M-mode): 2.9 cm LEFT ATRIUM             Index        RIGHT ATRIUM           Index LA diam:        5.00 cm 3.19 cm/m   RA Area:     17.90 cm LA Vol (A2C):   79.5 ml 50.65 ml/m  RA Volume:   48.20 ml  30.71 ml/m LA Vol (A4C):   73.6 ml 46.90 ml/m LA Biplane Vol: 78.6 ml 50.08 ml/m  AORTIC VALVE LVOT Vmax:   145.00 cm/s LVOT Vmean:  97.200 cm/s LVOT VTI:    0.361 m  AORTA Ao Root diam: 2.90 cm Ao Asc diam:  3.20 cm MITRAL VALVE                 TRICUSPID VALVE MV Area (PHT): 4.06 cm     TR Peak grad:   30.2 mmHg MV Decel Time: 187 msec     TR Vmax:        275.00 cm/s MV E velocity: 143.00 cm/s MV A velocity: 108.00 cm/s  SHUNTS MV E/A ratio:  1.32         Systemic VTI:  0.36 m                             Systemic Diam: 1.90 cm Carolan Clines Electronically signed by Carolan Clines Signature Date/Time: 01/19/2023/4:25:36 PM    Final    CT Angio Chest PE W and/or Wo Contrast  Result Date: 01/18/2023 CLINICAL DATA:  Increased respiratory rate.  Cough. EXAM: CT  ANGIOGRAPHY CHEST WITH CONTRAST TECHNIQUE: Multidetector CT imaging of the chest was performed using the standard protocol during bolus administration of intravenous contrast. Multiplanar CT image reconstructions and MIPs were obtained to evaluate the vascular anatomy. RADIATION DOSE REDUCTION: This exam was performed according to the departmental dose-optimization program which includes automated exposure control, adjustment of the mA and/or kV according to patient size and/or use of iterative reconstruction technique. CONTRAST:  75mL OMNIPAQUE IOHEXOL 350 MG/ML SOLN COMPARISON:  None Available. FINDINGS: Cardiovascular: Satisfactory opacification of the pulmonary arteries to the segmental level. No evidence of pulmonary embolism. Enlarged heart size. Small pericardial effusion. Mediastinum/Nodes: No enlarged mediastinal, hilar, or axillary lymph nodes. Shotty mediastinal lymph nodes. Thyroid gland, trachea, and esophagus demonstrate no significant findings. Lungs/Pleura: Patchy peripheral airspace opacities in bilateral lower lobes. No pleural effusion. Upper Abdomen: No acute abnormality. Musculoskeletal: Spondylosis of the thoracic spine. Review of the MIP images confirms the above findings. IMPRESSION: 1. No evidence of pulmonary embolism. 2. Patchy peripheral airspace opacities in bilateral lower lobes, may represent aspiration or atypical pneumonia. 3. Cardiomegaly with small pericardial  effusion. Electronically Signed   By: Ted Mcalpine M.D.   On: 01/18/2023 15:40   DG Chest Port 1 View  Result Date: 01/18/2023 CLINICAL DATA:  Persistent cough. EXAM: PORTABLE CHEST 1 VIEW COMPARISON:  December 11, 2022 FINDINGS: The heart size and mediastinal contours are within normal limits. Both lungs are clear. The visualized skeletal structures are unremarkable. IMPRESSION: No active disease. Electronically Signed   By: Ted Mcalpine M.D.   On: 01/18/2023 14:14    Cardiac Studies   2d echo 01/19/23   1. Left ventricular ejection fraction, by estimation, is 60 to 65%. The  left ventricle has normal function. The left ventricle has no regional  wall motion abnormalities. There is mild concentric left ventricular  hypertrophy. Left ventricular diastolic  parameters are consistent with Grade II diastolic dysfunction  (pseudonormalization).   2. Right ventricular systolic function is normal. The right ventricular  size is normal. There is normal pulmonary artery systolic pressure.   3. Left atrial size was severely dilated.   4. Right atrial size was moderately dilated.   5. A small pericardial effusion is present. There is no evidence of  cardiac tamponade.   6. The mitral valve is normal in structure. Trivial mitral valve  regurgitation.   7. The aortic valve is tricuspid. Aortic valve regurgitation is not  visualized.   8. The inferior vena cava is normal in size with greater than 50%  respiratory variability, suggesting right atrial pressure of 3 mmHg.    Patient Profile     82 y.o. female with chronic HFpEF, normal coronary arteries on 2010 catheterization, chronic lower extremity edema, hypertension, junctional and sinus bradycardia 2019 prompting d/c of beta blocker, carotid artery stenosis (40-59% RICA, 1-39% LICA 07/2021), CKD stage 3(a-b), diabetes, hyperlipidemia, OSA on CPAP, extremely hard of hearing. Her blood pressure has been a significant challenge over the  years. Notes outline she is very hard of hearing and gets confused easily. She had issues with hydralazine secondary to headaches/dizziness. Amlodipine was also an issue due to chronic lower extremity edema and was being maintained on reduced dosing. Did not tolerate chlorthalidone to bump in Cr though later unclear whether she had ever taken this at all. She was admitted with productive cough, found to have hypertensive crisis and pneumonia. Cardiology consulted for BP management.  Assessment & Plan    1. Hypertensive urgency  - complex issue over  the years made challenging by prior intolerances, patient getting confused easily, med changes from different directions - current rx amlodipine 10mg  daily (home dose)), hydralazine 100mg  TID (home dose though was on 50mg  TID beginning of hospital stay), irbesartan 300mg  daily (changed from losartan), chlorthalidone 25mg  daily (new/re-trial started 01/19/23 PM) - I will discuss acute plan with MD - methyldopa has been discontinued in the [place] as of 2021, clonidine previously avoided due to bradycardia, not a candidate for BB with bradycardia; the only good alternative I can see would be spironolactone though we only just started chlorthalidone last night and hydralazine is increased to 100mg  TID today so would follow - TSH wnl, UA with only 100 protein - daughter reports patient uses CPAP - ?consider renal artery duplex  2. Mild troponin elevation - not consistent with ACS, no plan for ischemic workup - continue medical therapy  3. Chronic HFpEF with CKD 3 (borderline a-b) - echo this admission with EF 60-65%, G2DD, severe LAE, mod RAE, small pericardial effusion - volume stable - on Lasix PRN Only PTA; holding given start of chlorthalidone  4. Carotid artery disease - 40-59% RICA, 1-39% LICA, right subclavian artery disturbed - consider updating in outpatient follow-up  5. Small pericardial effusion  - no evidence of tamponade, follow  clinically  Remainder of issues per primary team  For questions or updates, please contact [facility] Please consult [email or web address] for contact info under Cardiology/STEMI.  Signed, Laurann Montana, PA-C 01/20/2023, 1:09 PM

## 2023-01-20 NOTE — TOC Initial Note (Signed)
Transition of Care [facility]) - Initial/Assessment Note    Patient Details  Name: Kimberly [name] MRN: [id number] Date of Birth: 05/03/1941  [facility]) CM/SW Contact:    Leone Haven, RN Phone Number: 01/20/2023, 2:34 PM  Clinical Narrative:                 From home alone, has PCP and insurance on file, states has no HH services in place at this time , she has, walker and w/chair at home.   Daughter will transport them home at dc and family is support system, states gets medications from [facility] and [facility] in [place].  Pta self ambulatory. Expected Discharge Plan: Home/Self Care Barriers to Discharge: No Barriers Identified   Patient Goals and CMS Choice Patient states their goals for this hospitalization and ongoing recovery are:: return home   Choice offered to / list presented to : NA      Expected Discharge Plan and Services   Discharge Planning Services: CM Consult Post Acute Care Choice: NA Living arrangements for the past 2 months: Single Family Home Expected Discharge Date: 01/20/23               DME Arranged: N/A DME Agency: NA       HH Arranged: NA          Prior Living Arrangements/Services Living arrangements for the past 2 months: Single Family Home Lives with:: Self Patient language and need for interpreter reviewed:: Yes Do you feel safe going back to the place where you live?: Yes      Need for Family Participation in Patient Care: Yes (Comment) Care giver support system in place?: Yes (comment) Current home services: DME (w/chair, walker) Criminal Activity/Legal Involvement Pertinent to Current Situation/Hospitalization: No - Comment as needed  Activities of Daily Living Home Assistive Devices/Equipment: None ADL Screening (condition at time of admission) Patient's cognitive ability adequate to safely complete daily activities?: Yes Is the patient deaf or have difficulty hearing?: Yes Does the patient have difficulty  seeing, even when wearing glasses/contacts?: No Does the patient have difficulty concentrating, remembering, or making decisions?: No Patient able to express need for assistance with ADLs?: No Does the patient have difficulty dressing or bathing?: No Independently performs ADLs?: Yes (appropriate for developmental age) Does the patient have difficulty walking or climbing stairs?: No Weakness of Legs: None Weakness of Arms/Hands: None  Permission Sought/Granted Permission sought to share information with : Case Manager Permission granted to share information with : Yes, Verbal Permission Granted              Emotional Assessment Appearance:: Appears stated age Attitude/Demeanor/Rapport: Engaged Affect (typically observed): Appropriate Orientation: : Oriented to Self, Oriented to Place, Oriented to  Time, Oriented to Situation Alcohol / Substance Use: Not Applicable Psych Involvement: No (comment)  Admission diagnosis:  Hypertensive urgency [I16.0] Hypertensive emergency [I16.1] Pneumonia due to infectious organism, unspecified laterality, unspecified part of lung [J18.9] Patient Active Problem List   Diagnosis Date Noted   Hypertensive urgency 01/18/2023   Normocytic anemia 01/18/2023   AMS (altered mental status) 05/10/2019   Chronic kidney disease, stage 3a (HCC) 05/10/2019   Accelerated hypertension 05/10/2019   Sinus bradycardia 06/14/2017   Junctional bradycardia 06/12/2017   Essential hypertension 10/30/2015   Edema extremities 09/25/2015   Dietary noncompliance 09/25/2015   S/P thyroidectomy 09/18/2015   Postsurgical hypothyroidism 09/18/2015   Vitamin D insufficiency 09/18/2015   Chronic diastolic heart failure (HCC) 03/20/2015   Elevated troponin 09/11/2014  Hypertensive heart disease 09/11/2014   Rhinitis sicca 07/02/2014   Hypercalcemia 12/09/2013   Obstructive sleep apnea 08/29/2013   Acid reflux    PCP:  Clayborn Heron, MD Pharmacy:   [facility] - [place], [place] - [address] [place] [address] Phone: [phone] Fax: [phone]  [facility] [address], [place] - [address] [facility] [place] [facility] [place] [address] Phone: [phone] Fax: [phone]     Social Determinants of Health (SDOH) Social History: SDOH Screenings   Food Insecurity: No Food Insecurity (01/18/2023)  Housing: Low Risk  (01/18/2023)  Transportation Needs: No Transportation Needs (01/18/2023)  Utilities: Not At Risk (01/18/2023)  Tobacco Use: Low Risk  (01/18/2023)   SDOH Interventions:     Readmission Risk Interventions     No data to display

## 2023-01-20 NOTE — Plan of Care (Signed)
  Problem: Health Behavior/Discharge Planning: Goal: Ability to manage health-related needs will improve Outcome: Progressing   Problem: Clinical Measurements: Goal: Ability to maintain clinical measurements within normal limits will improve Outcome: Progressing Goal: Will remain free from infection Outcome: Progressing Goal: Diagnostic test results will improve Outcome: Progressing Goal: Respiratory complications will improve Outcome: Progressing   

## 2023-01-20 NOTE — Progress Notes (Signed)
   01/19/23 2310  BiPAP/CPAP/SIPAP  Reason BIPAP/CPAP not in use Non-compliant (refused)  BiPAP/CPAP /SiPAP Vitals  Pulse Rate (!) 53  Resp (!) 27  SpO2 96 %  MEWS Score/Color  MEWS Score 2  MEWS Score Color Yellow

## 2023-01-20 NOTE — TOC Transition Note (Signed)
Transition of Care [facility]) - CM/SW Discharge Note   Patient Details  Name: Kimberly [name] MRN: [id number] Date of Birth: [birth year]  [facility] of Care [facility]) CM/SW Contact:  Leone Haven, RN Phone Number: 01/20/2023, 2:36 PM   Clinical Narrative:    For dc today, daughter at bedside to transport home. No needs.    Final next level of care: Home/Self Care Barriers to Discharge: No Barriers Identified   Patient Goals and CMS Choice   Choice offered to / list presented to : NA  Discharge Placement                         Discharge Plan and Services Additional resources added to the After Visit Summary for     Discharge Planning Services: CM Consult Post Acute Care Choice: NA          DME Arranged: N/A DME Agency: NA       HH Arranged: NA          Social Determinants of Health (SDOH) Interventions SDOH Screenings   Food Insecurity: No Food Insecurity (01/18/2023)  Housing: Low Risk  (01/18/2023)  Transportation Needs: No Transportation Needs (01/18/2023)  Utilities: Not At Risk (01/18/2023)  Tobacco Use: Low Risk  (01/18/2023)     Readmission Risk Interventions     No data to display

## 2023-01-20 NOTE — Discharge Summary (Signed)
Physician Discharge Summary  Kimberly [name]:[id number] DOB: [birth year] DOA: 01/18/2023  PCP: Clayborn Heron, MD  Admit date: 01/18/2023 Discharge date: 01/20/2023  Admitted From: Home  Discharge disposition: Home   Recommendations for Outpatient Follow-Up:   Follow up with your primary care provider in one week.  Needs better adjustment of medications for blood pressure. Check CBC, BMP, magnesium in the next visit Follow-up with cardiology as scheduled by you   Discharge Diagnosis:   Principal Problem:   Hypertensive urgency Active Problems:   Obstructive sleep apnea   Elevated troponin   Chronic diastolic heart failure (HCC)   Chronic kidney disease, stage 3a (HCC)   Normocytic anemia  Discharge Condition: Improved.  Diet recommendation: Low sodium, heart healthy.    Wound care: None.  Code status: Full.   History of Present Illness:   Kimberly [name] is a 82 y.o. female with medical history significant for hypertension, chronic HFpEF, CKD 3b, hyperlipidemia, hypothyroidism, and OSA on CPAP who presented to the hospital with productive cough and elevated blood pressure.  Patient stated that there was some initial improvement with antibiotic but cough again returned.  In the ED, patient was afebrile.  Blood pressure was elevated. CTA chest was negative for PE but notable for patchy airspace opacities in the bilateral bases.  Labs with creatinine 1.19, hemoglobin 9.2, BNP 231, and troponin 18 which increased to 42.  Cardiology was consulted from the ED and patient was out of the hospital for further evaluation treatment.   Hospital Course:   Following conditions were addressed during hospitalization as listed below,  Hypertensive urgency - Continue Norvasc, losartan, and hydralazine, chlorthalidone on discharge.  Dose of hydralazine has been increased and chlorthalidone has been added to the regimen on this admission.  Latest blood pressure of 166/62.  This will  need to be closely monitored as outpatient with cardiology/PCP.   Elevated troponin  No chest pain with elevated troponins.  Cardiology was consulted.  2D echocardiogram showed LV ejection fraction of 60 to 65% with grade 2 diastolic dysfunction.  Patient has been started on chlorthalidone and hydralazine dose has been increased.  Patient will need to follow-up with cardiology as outpatient for GDMT   Possible basal pneumonia  -Negative for strep urinary antigen.  Pending legionella antigens, send received Rocephin and azithromycin, procalcitonin less than 0.1.  COVID-19 negative.  BNP elevated at 230.  No leukocytosis.  At this time we will transition to doxycycline for another to complete a total 5-day course   Chronic HFpEF  Continue losartan    CKD 3a  At baseline.  Latest creatinine of 1.2.   OSA  - CPAP at bedtime.   Anemia of chronic disease - Hgb is 8.4  down from 11.5 in 2021 no evidence of bleeding.  Will continue to monitor   Hyperlipidemia  - Continue Lipitor   Disposition.  At this time, patient is stable for disposition home with outpatient PCP and cardiology follow-up.  Spoke with the patient's daughter on the phone and updated her about the plan for disposition and follow-up.  Medical Consultants:   Cardiology  Procedures:    2D echocardiogram. Subjective:   Today, patient was seen and examined at bedside.  Hard of hearing.  Overall feels better.  No fever chills or chest pain.  Has mild cough.  Discharge Exam:   Vitals:   01/20/23 1340 01/20/23 1415  BP: (!) 166/55 (!) 161/48  Pulse:    Resp:  18  Temp:    SpO2:     Vitals:   01/20/23 0853 01/20/23 1135 01/20/23 1340 01/20/23 1415  BP: (!) 180/61 (!) 166/62 (!) 166/55 (!) 161/48  Pulse: 62 65    Resp: 16 17  18   Temp: 98.7 F (37.1 C) 98.6 F (37 C)    TempSrc: Oral Oral    SpO2: 95% 95%    Weight:      Height:       Body mass index is 26.17 kg/m.  General: Alert awake, not in obvious  distress, hard of hearing HENT: pupils equally reacting to light,  No scleral pallor or icterus noted. Oral mucosa is moist.  Chest:    Diminished breath sounds bilaterally.  CVS: S1 &S2 heard. No murmur.  Regular rate and rhythm. Abdomen: Soft, nontender, nondistended.  Bowel sounds are heard.   Extremities: No cyanosis, clubbing or edema.  Peripheral pulses are palpable. Psych: Alert, awake and oriented, normal mood CNS:  No cranial nerve deficits.  Power equal in all extremities.   Skin: Warm and dry.  No rashes noted.  The results of significant diagnostics from this hospitalization (including imaging, microbiology, ancillary and laboratory) are listed below for reference.     Diagnostic Studies:   ECHOCARDIOGRAM COMPLETE  Result Date: 01/19/2023    ECHOCARDIOGRAM REPORT   Patient Name:   Kimberly [name] Date of Exam: 01/19/2023 Medical Rec #:  [id number]      Height:       60.0 in Accession #:    [id number]     Weight:       133.0 lb Date of Birth:  August 04, 1940      BSA:          1.569 m Patient Age:    82 years       BP:           181/54 mmHg Patient Gender: F              HR:           58 bpm. Exam Location:  Inpatient Procedure: 2D Echo, Cardiac Doppler and Color Doppler Indications:    Elevated Troponin  History:        Patient has prior history of Echocardiogram examinations, most                 recent 02/02/2020. CHF, Arrythmias:Bradycardia; Risk                 Factors:Diabetes.  Sonographer:    Eulah Pont RDCS Referring Phys: Rebekah Chesterfield Sundee Garland IMPRESSIONS  1. Left ventricular ejection fraction, by estimation, is 60 to 65%. The left ventricle has normal function. The left ventricle has no regional wall motion abnormalities. There is mild concentric left ventricular hypertrophy. Left ventricular diastolic parameters are consistent with Grade II diastolic dysfunction (pseudonormalization).  2. Right ventricular systolic function is normal. The right ventricular size is normal. There is normal  pulmonary artery systolic pressure.  3. Left atrial size was severely dilated.  4. Right atrial size was moderately dilated.  5. A small pericardial effusion is present. There is no evidence of cardiac tamponade.  6. The mitral valve is normal in structure. Trivial mitral valve regurgitation.  7. The aortic valve is tricuspid. Aortic valve regurgitation is not visualized.  8. The inferior vena cava is normal in size with greater than 50% respiratory variability, suggesting right atrial pressure of 3 mmHg. Comparison(s): No significant change from prior study. FINDINGS  Left Ventricle: Left  ventricular ejection fraction, by estimation, is 60 to 65%. The left ventricle has normal function. The left ventricle has no regional wall motion abnormalities. The left ventricular internal cavity size was normal in size. There is  mild concentric left ventricular hypertrophy. Left ventricular diastolic parameters are consistent with Grade II diastolic dysfunction (pseudonormalization). Right Ventricle: The right ventricular size is normal. Right ventricular systolic function is normal. There is normal pulmonary artery systolic pressure. The tricuspid regurgitant velocity is 2.75 m/s, and with an assumed right atrial pressure of 3 mmHg,  the estimated right ventricular systolic pressure is 33.2 mmHg. Left Atrium: Left atrial size was severely dilated. Right Atrium: Right atrial size was moderately dilated. Pericardium: A small pericardial effusion is present. There is no evidence of cardiac tamponade. Mitral Valve: The mitral valve is normal in structure. Trivial mitral valve regurgitation. Tricuspid Valve: Tricuspid valve regurgitation is mild. Aortic Valve: The aortic valve is tricuspid. Aortic valve regurgitation is not visualized. Pulmonic Valve: Pulmonic valve regurgitation is not visualized. Aorta: The aortic root and ascending aorta are structurally normal, with no evidence of dilitation. Venous: The inferior vena cava is  normal in size with greater than 50% respiratory variability, suggesting right atrial pressure of 3 mmHg. IAS/Shunts: No atrial level shunt detected by color flow Doppler.  LEFT VENTRICLE PLAX 2D LVIDd:         5.50 cm     Diastology LVIDs:         2.90 cm     LV e' medial:    5.35 cm/s LV PW:         1.10 cm     LV E/e' medial:  26.7 LV IVS:        1.10 cm     LV e' lateral:   6.57 cm/s LVOT diam:     1.90 cm     LV E/e' lateral: 21.8 LV SV:         102 LV SV Index:   65 LVOT Area:     2.84 cm  LV Volumes (MOD) LV vol d, MOD A2C: 95.6 ml LV vol d, MOD A4C: 90.1 ml LV vol s, MOD A2C: 32.5 ml LV vol s, MOD A4C: 36.1 ml LV SV MOD A2C:     63.1 ml LV SV MOD A4C:     90.1 ml LV SV MOD BP:      58.6 ml RIGHT VENTRICLE RV S prime:     15.20 cm/s TAPSE (M-mode): 2.9 cm LEFT ATRIUM             Index        RIGHT ATRIUM           Index LA diam:        5.00 cm 3.19 cm/m   RA Area:     17.90 cm LA Vol (A2C):   79.5 ml 50.65 ml/m  RA Volume:   48.20 ml  30.71 ml/m LA Vol (A4C):   73.6 ml 46.90 ml/m LA Biplane Vol: 78.6 ml 50.08 ml/m  AORTIC VALVE LVOT Vmax:   145.00 cm/s LVOT Vmean:  97.200 cm/s LVOT VTI:    0.361 m  AORTA Ao Root diam: 2.90 cm Ao Asc diam:  3.20 cm MITRAL VALVE                TRICUSPID VALVE MV Area (PHT): 4.06 cm     TR Peak grad:   30.2 mmHg MV Decel Time: 187 msec     TR Vmax:  275.00 cm/s MV E velocity: 143.00 cm/s MV A velocity: 108.00 cm/s  SHUNTS MV E/A ratio:  1.32         Systemic VTI:  0.36 m                             Systemic Diam: 1.90 cm Carolan Clines Electronically signed by Carolan Clines Signature Date/Time: 01/19/2023/4:25:36 PM    Final    CT Angio Chest PE W and/or Wo Contrast  Result Date: 01/18/2023 CLINICAL DATA:  Increased respiratory rate.  Cough. EXAM: CT ANGIOGRAPHY CHEST WITH CONTRAST TECHNIQUE: Multidetector CT imaging of the chest was performed using the standard protocol during bolus administration of intravenous contrast. Multiplanar CT image reconstructions and MIPs  were obtained to evaluate the vascular anatomy. RADIATION DOSE REDUCTION: This exam was performed according to the departmental dose-optimization program which includes automated exposure control, adjustment of the mA and/or kV according to patient size and/or use of iterative reconstruction technique. CONTRAST:  75mL OMNIPAQUE IOHEXOL 350 MG/ML SOLN COMPARISON:  None Available. FINDINGS: Cardiovascular: Satisfactory opacification of the pulmonary arteries to the segmental level. No evidence of pulmonary embolism. Enlarged heart size. Small pericardial effusion. Mediastinum/Nodes: No enlarged mediastinal, hilar, or axillary lymph nodes. Shotty mediastinal lymph nodes. Thyroid gland, trachea, and esophagus demonstrate no significant findings. Lungs/Pleura: Patchy peripheral airspace opacities in bilateral lower lobes. No pleural effusion. Upper Abdomen: No acute abnormality. Musculoskeletal: Spondylosis of the thoracic spine. Review of the MIP images confirms the above findings. IMPRESSION: 1. No evidence of pulmonary embolism. 2. Patchy peripheral airspace opacities in bilateral lower lobes, may represent aspiration or atypical pneumonia. 3. Cardiomegaly with small pericardial effusion. Electronically Signed   By: Ted Mcalpine M.D.   On: 01/18/2023 15:40   DG Chest Port 1 View  Result Date: 01/18/2023 CLINICAL DATA:  Persistent cough. EXAM: PORTABLE CHEST 1 VIEW COMPARISON:  December 11, 2022 FINDINGS: The heart size and mediastinal contours are within normal limits. Both lungs are clear. The visualized skeletal structures are unremarkable. IMPRESSION: No active disease. Electronically Signed   By: Ted Mcalpine M.D.   On: 01/18/2023 14:14     Labs:   Basic Metabolic Panel: Recent Labs  Lab 01/18/23 1416 01/19/23 0235 01/20/23 0253  NA 138 139 139  K 3.6 3.8 3.7  CL 105 106 106  CO2 25 23 25   GLUCOSE 175* 151* 91  BUN 32* 26* 26*  CREATININE 1.19* 1.26* 1.27*  CALCIUM 9.3 8.8* 8.7*   MG  --  1.8 1.8   GFR Estimated Creatinine Clearance: 28.3 mL/min (A) (by C-G formula based on SCr of 1.27 mg/dL (H)). Liver Function Tests: Recent Labs  Lab 01/18/23 1416  AST 26  ALT 15  ALKPHOS 33*  BILITOT 0.4  PROT 6.3*  ALBUMIN 3.5   No results for input(s): "LIPASE", "AMYLASE" in the last 168 hours. No results for input(s): "AMMONIA" in the last 168 hours. Coagulation profile No results for input(s): "INR", "PROTIME" in the last 168 hours.  CBC: Recent Labs  Lab 01/18/23 1416 01/19/23 0235 01/20/23 0253  WBC 4.6 5.4 4.7  NEUTROABS 3.4  --   --   HGB 9.2* 8.4* 8.3*  HCT 27.7* 25.6* 25.8*  MCV 88.8 88.6 88.4  PLT 175 202 208   Cardiac Enzymes: No results for input(s): "CKTOTAL", "CKMB", "CKMBINDEX", "TROPONINI" in the last 168 hours. BNP: Invalid input(s): "POCBNP" CBG: No results for input(s): "GLUCAP" in the last 168 hours. D-Dimer  No results for input(s): "DDIMER" in the last 72 hours. Hgb A1c Recent Labs    01/20/23 0253  HGBA1C 5.1   Lipid Profile No results for input(s): "CHOL", "HDL", "LDLCALC", "TRIG", "CHOLHDL", "LDLDIRECT" in the last 72 hours. Thyroid function studies Recent Labs    01/20/23 0253  TSH 3.414   Anemia work up No results for input(s): "VITAMINB12", "FOLATE", "FERRITIN", "TIBC", "IRON", "RETICCTPCT" in the last 72 hours. Microbiology Recent Results (from the past 240 hour(s))  SARS Coronavirus 2 by RT PCR (hospital order, performed in [facility] hospital lab) *cepheid single result test* Anterior Nasal Swab     Status: None   Collection Time: 01/18/23  2:49 PM   Specimen: Anterior Nasal Swab  Result Value Ref Range Status   SARS Coronavirus 2 by RT PCR NEGATIVE NEGATIVE Final    Comment: (NOTE) SARS-CoV-2 target nucleic acids are NOT DETECTED.  The SARS-CoV-2 RNA is generally detectable in upper and lower respiratory specimens during the acute phase of infection. The lowest concentration of SARS-CoV-2 viral copies  this assay can detect is 250 copies / mL. A negative result does not preclude SARS-CoV-2 infection and should not be used as the sole basis for treatment or other patient management decisions.  A negative result may occur with improper specimen collection / handling, submission of specimen other than nasopharyngeal swab, presence of viral mutation(s) within the areas targeted by this assay, and inadequate number of viral copies (<250 copies / mL). A negative result must be combined with clinical observations, patient history, and epidemiological information.  Fact Sheet for Patients:   RoadLapTop.co.za  Fact Sheet for Healthcare Providers: http://kim-miller.com/  This test is not yet approved or  cleared by the [place] FDA and has been authorized for detection and/or diagnosis of SARS-CoV-2 by FDA under an Emergency Use Authorization (EUA).  This EUA will remain in effect (meaning this test can be used) for the duration of the COVID-19 declaration under Section 564(b)(1) of the Act, 21 U.S.C. section 360bbb-3(b)(1), unless the authorization is terminated or revoked sooner.  Performed at [facility], [address] [place], [place] [address]      Discharge Instructions:   Discharge Instructions     Diet - low sodium heart healthy   Complete by: As directed    Discharge instructions   Complete by: As directed    Follow-up with your primary care provider in 1 week.  Check blood work at that time.  Complete course of antibiotic.  Your blood pressure medications have changed.  Please take note.  Will need closer monitoring with primary care provider for blood pressure medication adjustment.  Please stay on low-salt diet.  Use inhalers from home.   Increase activity slowly   Complete by: As directed       Allergies as of 01/20/2023       Reactions   Oxycodone-acetaminophen Shortness Of Breath, Itching, Other (See  Comments)   Pt couldn't breathe or catch her breath   Percocet [oxycodone-acetaminophen] Shortness Of Breath, Other (See Comments)   Pt couldn't breathe or catch her breath   Codeine Itching, Other (See Comments)   hyperactivity   Other Other (See Comments)   Some pain medication given at [facility] for surgery caused hallucinations    Promethazine Hcl Nausea And Vomiting, Other (See Comments)   Hallucinations   Clonidine Derivatives Other (See Comments)   Bradycardia (06/2017)   Hydralazine Other (See Comments)   Headache/dizzy   Clonidine Palpitations  Bradycardia (06/2017)   Metoprolol Palpitations   Bradycardia 06/2017        Medication List     STOP taking these medications    amoxicillin 500 MG capsule Commonly known as: AMOXIL   furosemide 20 MG tablet Commonly known as: LASIX       TAKE these medications    albuterol 108 (90 Base) MCG/ACT inhaler Commonly known as: VENTOLIN HFA Inhalation for 50 Days   amLODipine 10 MG tablet Commonly known as: NORVASC Take 1 tablet (10 mg total) by mouth daily.   aspirin 81 MG chewable tablet Chew 81 mg by mouth daily.   atorvastatin 40 MG tablet Commonly known as: LIPITOR TAKE ONE TABLET BY MOUTH ONCE DAILY   chlorthalidone 25 MG tablet Commonly known as: HYGROTON Take 1 tablet (25 mg total) by mouth daily. Start taking on: January 21, 2023   cloNIDine 0.1 MG tablet Commonly known as: CATAPRES Take 0.1 mg by mouth every 6 (six) hours as needed. If BP is over 180   doxycycline 100 MG tablet Commonly known as: VIBRA-TABS Take 1 tablet (100 mg total) by mouth 2 (two) times daily for 3 days.   fenofibrate micronized 200 MG capsule Commonly known as: LOFIBRA Take 200 mg by mouth daily before breakfast.   guaiFENesin 100 MG/5ML liquid Commonly known as: ROBITUSSIN Take 5 mLs by mouth every 4 (four) hours as needed for cough or to loosen phlegm.   hydrALAZINE 100 MG tablet Commonly known as:  APRESOLINE Take 1 tablet (100 mg total) by mouth 3 (three) times daily. What changed:  medication strength See the new instructions.   levothyroxine 137 MCG tablet Commonly known as: SYNTHROID TAKE ONE TABLET BY MOUTH BEFORE BREAKFAST   losartan 50 MG tablet Commonly known as: COZAAR Take 1 tablet (50 mg total) by mouth daily. Please keep upcoming appt for future refills. Thank you   nitroGLYCERIN 0.4 MG SL tablet Commonly known as: NITROSTAT Place 1 tablet (0.4 mg total) under the tongue every 5 (five) minutes as needed for chest pain.   PARoxetine 40 MG tablet Commonly known as: PAXIL 1.5 tablets   Vitamin D3 125 MCG (5000 UT) Caps Take 5,000 Units by mouth daily.        Follow-up Information     Rankins, Fanny Dance, MD. Go on 02/02/2023.   Specialty: Family Medicine Why: @10 :30am with Rosine Beat information: [address] [place] [address] [phone]                  Time coordinating discharge: 39 minutes  Signed:  [employer]  [facility] 01/20/2023, 8:01 PM

## 2023-01-20 NOTE — Plan of Care (Signed)
  Problem: Education: Goal: Knowledge of General Education information will improve Description: Including pain rating scale, medication(s)/side effects and non-pharmacologic comfort measures Outcome: Adequate for Discharge   Problem: Health Behavior/Discharge Planning: Goal: Ability to manage health-related needs will improve Outcome: Adequate for Discharge   Problem: Clinical Measurements: Goal: Ability to maintain clinical measurements within normal limits will improve Outcome: Adequate for Discharge Goal: Will remain free from infection Outcome: Adequate for Discharge Goal: Diagnostic test results will improve Outcome: Adequate for Discharge Goal: Respiratory complications will improve Outcome: Adequate for Discharge Goal: Cardiovascular complication will be avoided Outcome: Adequate for Discharge   Problem: Activity: Goal: Risk for activity intolerance will decrease Outcome: Adequate for Discharge   Problem: Nutrition: Goal: Adequate nutrition will be maintained Outcome: Adequate for Discharge   Problem: Coping: Goal: Level of anxiety will decrease Outcome: Adequate for Discharge   Problem: Elimination: Goal: Will not experience complications related to bowel motility Outcome: Adequate for Discharge Goal: Will not experience complications related to urinary retention Outcome: Adequate for Discharge   Problem: Pain Managment: Goal: General experience of comfort will improve Outcome: Adequate for Discharge   Problem: Safety: Goal: Ability to remain free from injury will improve Outcome: Adequate for Discharge   Problem: Skin Integrity: Goal: Risk for impaired skin integrity will decrease Outcome: Adequate for Discharge   Problem: Activity: Goal: Ability to tolerate increased activity will improve Outcome: Adequate for Discharge   Problem: Clinical Measurements: Goal: Ability to maintain a body temperature in the normal range will improve Outcome: Adequate  for Discharge   Problem: Respiratory: Goal: Ability to maintain adequate ventilation will improve Outcome: Adequate for Discharge Goal: Ability to maintain a clear airway will improve Outcome: Adequate for Discharge   Problem: Education: Goal: Knowledge of General Education information will improve Description: Including pain rating scale, medication(s)/side effects and non-pharmacologic comfort measures Outcome: Adequate for Discharge   Problem: Health Behavior/Discharge Planning: Goal: Ability to manage health-related needs will improve Outcome: Adequate for Discharge   Problem: Clinical Measurements: Goal: Ability to maintain clinical measurements within normal limits will improve Outcome: Adequate for Discharge Goal: Will remain free from infection Outcome: Adequate for Discharge Goal: Diagnostic test results will improve Outcome: Adequate for Discharge Goal: Respiratory complications will improve Outcome: Adequate for Discharge Goal: Cardiovascular complication will be avoided Outcome: Adequate for Discharge   Problem: Activity: Goal: Risk for activity intolerance will decrease Outcome: Adequate for Discharge   Problem: Nutrition: Goal: Adequate nutrition will be maintained Outcome: Adequate for Discharge   Problem: Coping: Goal: Level of anxiety will decrease Outcome: Adequate for Discharge   Problem: Elimination: Goal: Will not experience complications related to bowel motility Outcome: Adequate for Discharge Goal: Will not experience complications related to urinary retention Outcome: Adequate for Discharge   Problem: Pain Managment: Goal: General experience of comfort will improve Outcome: Adequate for Discharge   Problem: Safety: Goal: Ability to remain free from injury will improve Outcome: Adequate for Discharge   Problem: Skin Integrity: Goal: Risk for impaired skin integrity will decrease Outcome: Adequate for Discharge   Problem:  Activity: Goal: Ability to tolerate increased activity will improve Outcome: Adequate for Discharge   Problem: Clinical Measurements: Goal: Ability to maintain a body temperature in the normal range will improve Outcome: Adequate for Discharge   Problem: Respiratory: Goal: Ability to maintain adequate ventilation will improve Outcome: Adequate for Discharge Goal: Ability to maintain a clear airway will improve Outcome: Adequate for Discharge

## 2023-02-02 ENCOUNTER — Telehealth: Payer: Self-pay | Admitting: Cardiology

## 2023-02-02 DIAGNOSIS — J209 Acute bronchitis, unspecified: Secondary | ICD-10-CM | POA: Diagnosis not present

## 2023-02-02 DIAGNOSIS — R058 Other specified cough: Secondary | ICD-10-CM | POA: Diagnosis not present

## 2023-02-02 DIAGNOSIS — I5032 Chronic diastolic (congestive) heart failure: Secondary | ICD-10-CM | POA: Diagnosis not present

## 2023-02-02 DIAGNOSIS — Z23 Encounter for immunization: Secondary | ICD-10-CM | POA: Diagnosis not present

## 2023-02-02 DIAGNOSIS — I10 Essential (primary) hypertension: Secondary | ICD-10-CM | POA: Diagnosis not present

## 2023-02-02 DIAGNOSIS — Z6825 Body mass index (BMI) 25.0-25.9, adult: Secondary | ICD-10-CM | POA: Diagnosis not present

## 2023-02-02 DIAGNOSIS — I110 Hypertensive heart disease with heart failure: Secondary | ICD-10-CM | POA: Diagnosis not present

## 2023-02-02 MED ORDER — ATORVASTATIN CALCIUM 40 MG PO TABS: 40.0000 mg | ORAL_TABLET | Freq: Every day | ORAL | 0 refills | Status: DC

## 2023-02-02 MED ORDER — FENOFIBRATE MICRONIZED 200 MG PO CAPS: 200.0000 mg | ORAL_CAPSULE | Freq: Every day | ORAL | 0 refills | Status: DC

## 2023-02-02 MED ORDER — AMLODIPINE BESYLATE 10 MG PO TABS: 10.0000 mg | ORAL_TABLET | Freq: Every day | ORAL | 0 refills | Status: DC

## 2023-02-02 MED ORDER — CLONIDINE HCL 0.1 MG PO TABS: 0.1000 mg | ORAL_TABLET | Freq: Four times a day (QID) | ORAL | 0 refills | Status: DC | PRN

## 2023-02-02 NOTE — Telephone Encounter (Signed)
*  STAT* If patient is at the pharmacy, call can be transferred to refill team.   1. Which medications need to be refilled? (please list name of each medication and dose if known) amLODipine (NORVASC) 10 MG tablet  atorvastatin (LIPITOR) 40 MG tablet   cloNIDine (CATAPRES) 0.1 MG tablet  fenofibrate micronized (LOFIBRA) 200 MG capsule  2. Would you like to learn more about the convenience, safety, & potential cost savings by using the [facility]? No   3. Are you open to using the [facility] (Type [facility] No   4. Which pharmacy/location (including street and city if local pharmacy) is medication to be sent to?[id number] - [place], [place] - [address]    5. Do they need a 30 day or 90 day supply? 90 day   Pt ha scheduled apt on 10/31

## 2023-02-03 ENCOUNTER — Ambulatory Visit: Payer: Medicare PPO | Admitting: Physician Assistant

## 2023-02-17 DIAGNOSIS — E039 Hypothyroidism, unspecified: Secondary | ICD-10-CM | POA: Diagnosis not present

## 2023-02-17 DIAGNOSIS — E785 Hyperlipidemia, unspecified: Secondary | ICD-10-CM | POA: Diagnosis not present

## 2023-02-17 DIAGNOSIS — I10 Essential (primary) hypertension: Secondary | ICD-10-CM | POA: Diagnosis not present

## 2023-02-17 DIAGNOSIS — H35033 Hypertensive retinopathy, bilateral: Secondary | ICD-10-CM | POA: Diagnosis not present

## 2023-02-17 DIAGNOSIS — Z6826 Body mass index (BMI) 26.0-26.9, adult: Secondary | ICD-10-CM | POA: Diagnosis not present

## 2023-02-17 DIAGNOSIS — Z91148 Patient's other noncompliance with medication regimen for other reason: Secondary | ICD-10-CM | POA: Diagnosis not present

## 2023-02-17 DIAGNOSIS — H401131 Primary open-angle glaucoma, bilateral, mild stage: Secondary | ICD-10-CM | POA: Diagnosis not present

## 2023-02-20 DIAGNOSIS — G4733 Obstructive sleep apnea (adult) (pediatric): Secondary | ICD-10-CM | POA: Diagnosis not present

## 2023-02-27 ENCOUNTER — Telehealth: Payer: Self-pay | Admitting: Cardiology

## 2023-02-27 NOTE — Telephone Encounter (Signed)
   NP Taylar Pridgen called to provide an update regarding the patient. She noted that she has seen the patient several times and observed that their blood pressure has been variable. When she inquired about the patient's medications, the patient was unsure of what they were taking. NP Pridgen contacted the medication representative to discuss this with the patient today and discovered that a few medications are missing.  During today's visit, the patient's blood pressure was recorded at 148/56, prompting NP Pridgen to hold the patient's amlodipine for the time being. Additionally, the patient's pharmacy has been switched to [facility] to reduce confusion regarding their medications. NP Pridgen also called in a three-month supply of all the patient's medications, including prescriptions from Dr. Jola Babinski, as a one-time refill.  For any questions, she gave her CMA direct line [phone]

## 2023-03-03 NOTE — Telephone Encounter (Signed)
Call to patient to change appointment, no answer at any numbers listed in epic, no VM set up on home number (DPR), left message on mobile # with no identifiers asking recipient to call our office. Also called daughter (DPR) and left message with no identifiers asking recipient to call our office. Changed appt to 03/17/23 at 8:50 with APP, sent MyChart message.

## 2023-03-11 ENCOUNTER — Telehealth: Payer: Self-pay | Admitting: Physician Assistant

## 2023-03-11 MED ORDER — FENOFIBRATE MICRONIZED 200 MG PO CAPS: 200.0000 mg | ORAL_CAPSULE | Freq: Every day | ORAL | 0 refills | Status: DC

## 2023-03-11 MED ORDER — ATORVASTATIN CALCIUM 40 MG PO TABS: 40.0000 mg | ORAL_TABLET | Freq: Every day | ORAL | 0 refills | Status: DC

## 2023-03-11 MED ORDER — CLONIDINE HCL 0.1 MG PO TABS: 0.1000 mg | ORAL_TABLET | Freq: Four times a day (QID) | ORAL | 0 refills | Status: DC | PRN

## 2023-03-11 MED ORDER — AMLODIPINE BESYLATE 10 MG PO TABS: 10.0000 mg | ORAL_TABLET | Freq: Every day | ORAL | 0 refills | Status: DC

## 2023-03-11 NOTE — Telephone Encounter (Signed)
*  STAT* If patient is at the pharmacy, call can be transferred to refill team.   1. Which medications need to be refilled? (please list name of each medication and dose if known) amLODipine (NORVASC) 10 MG tablet ; atorvastatin (LIPITOR) 40 MG tablet ; cloNIDine (CATAPRES) 0.1 MG tablet ; fenofibrate micronized (LOFIBRA) 200 MG capsule    2. Would you like to learn more about the convenience, safety, & potential cost savings by using the [facility]? No      3. Are you open to using the [facility] (Type [facility]. No ).   4. Which pharmacy/location (including street and city if local pharmacy) is medication to be sent to? [facility] - [place], [place] - [address]    5. Do they need a 30 day or 90 day supply? 90

## 2023-03-11 NOTE — Telephone Encounter (Signed)
Pt's medications were sent to pt's pharmacy as requested. Confirmation received.  

## 2023-03-12 ENCOUNTER — Ambulatory Visit: Payer: Medicare PPO | Admitting: Cardiology

## 2023-03-16 NOTE — Progress Notes (Unsigned)
Cardiology Office Note:    Date:  03/17/2023  ID:  Kimberly [name], DOB [birth year], MRN [id number] PCP: Inez Pilgrim, NP  [facility] Providers Cardiologist:  Armanda Magic, MD       Patient Profile:      (HFpEF) heart failure with preserved ejection fraction  TTE 01/19/23: EF 60-65, no RWMA, mild LVH, Gr 2 DD, NL RVSF, NL PASP, severe LAE, mod RAE, small effusion, trivial MR, RAP 3 Chronic lower ext edema Hypertension Intol of hydralazine (HA, dizzy); higher dose Amlodipine (edema); chlorthalidone (AKI)  Cardiac catheterization in 2010: normal coronary arteries Myoview 02/02/20: EF 65, no ischemia or infarction, low risk Junctional bradycardia Admx in 06/2017: clonidine and metoprolol DC'd  Carotid artery disease Carotid US 07/15/21: RICA 40-59, LICA 1-39 Chronic kidney disease  Diabetes mellitus  Hyperlipidemia  OSA            History of Present Illness:  Discussed the use of AI scribe software for clinical note transcription with the patient, who gave verbal consent to proceed.  Kimberly [name] is a 82 y.o. female who returns for follow up of CHF, HTN. She was last seen by [name] in 06/2021. She was admitted in 01/2023 with hypertensive urgency and pneumonia. Her hsTroponins were mildly elevated without trend and not c/w ACS. She was seen by Cardiology. TTE showed normal EF with no WMA. No further testing was recommended.  She is here alone. The patient reported a significant increase in blood pressure since the hospitalization, with home readings averaging 172/65. The patient also reported being out of amlodipine and clonidine.  She takes clonidine as needed for systolic pressure above 180.  She does note continued weakness which is chronic since June.  The weakness has improved somewhat since the hospitalization but remains a concern. The patient also reported occasional shortness of breath with exertion, which has been improving.  She has not had chest pain, syncope, or  orthopnea. The patient uses a CPAP machine for sleep and can lay flat without difficulty. The patient reported no changes in leg edema, which appears to be chronic. There were no reports of hematuria, melena, hematochezia, fever, or cough. The patient denied any tobacco or alcohol use.     Review of Systems  Constitutional: Negative for fever.  Respiratory:  Negative for cough.   Gastrointestinal:  Negative for hematochezia and melena.  Genitourinary:  Negative for hematuria.  See HPI     Studies Reviewed:       Labs from [facility] 02/02/2023: Hemoglobin 9.8 01/20/2023: Creatinine 1.27, GFR 42, K+ 3.7     Risk Assessment/Calculations:     HYPERTENSION CONTROL Vitals:   03/17/23 0848 03/17/23 1020  BP: (!) 179/50 (!) 170/60    The patient's blood pressure is elevated above target today.  In order to address the patient's elevated BP: A new medication was prescribed today.          Physical Exam:   VS:  BP (!) 170/60   Pulse 76   Ht 5' (1.524 m)   Wt 134 lb 6.4 oz (61 kg)   SpO2 95%   BMI 26.25 kg/m    Wt Readings from Last 3 Encounters:  03/17/23 134 lb 6.4 oz (61 kg)  01/20/23 134 lb (60.8 kg)  08/27/22 141 lb 9.6 oz (64.2 kg)    Constitutional:      Appearance: Healthy appearance. Not in distress.  Neck:     Vascular: No JVR. JVD normal.  Pulmonary:  Breath sounds: Normal breath sounds. No wheezing. No rales.  Cardiovascular:     Normal rate. Regular rhythm.     Murmurs: There is a grade 2/6 systolic murmur at the LLSB and ULSB.  Edema:    Peripheral edema present.    Pretibial: bilateral trace edema of the pretibial area.    Ankle: bilateral trace edema of the ankle. Abdominal:     General: There is no distension.     Palpations: Abdomen is soft.        Assessment and [name]:   Assessment & [name] Chronic heart failure with preserved ejection fraction (HCC) EF 60-65 by echocardiogram September 2024 with moderate diastolic dysfunction.  NYHA IIb-III.  Volume  status stable on exam.  Weight is stable. - Continue chlorthalidone 25 mg daily - Consider spironolactone or SGLT2 inhibitor if volume is difficult to control  Essential hypertension Uncontrolled. - Discontinue losartan - Start valsartan 160 mg daily - BMET in 2 weeks - Refill amlodipine 10 mg daily - Follow up with hypertension clinic in two weeks - Continue chlorthalidone 25 mg daily, clonidine 0.1 mg every 6 hours as needed for systolic pressure >180, hydralazine 100 mg 3 times a day - Follow up with [name] or me in 3 mos Bilateral carotid artery stenosis 40-59% right ICA stenosis in March 2023.   - Arrange follow-up carotid US.   - Continue aspirin 81 mg daily, Lipitor 40 mg daily. Chronic kidney disease, stage 3a (HCC) GFR 42.  Change losartan to valsartan as noted above.  Repeat BMET 2 weeks. Normocytic anemia Hemoglobin of 9.8 noted on recent blood work with primary care.  This is likely contributing somewhat to her symptoms of weakness.  Continue follow-up with primary care.        Dispo:  Return in about 3 months (around 06/17/2023) for Routine Follow Up, w/ [name], or Tereso Newcomer, PA-C.  Signed, Tereso Newcomer, PA-C

## 2023-03-17 ENCOUNTER — Ambulatory Visit: Payer: Medicare PPO | Attending: Physician Assistant | Admitting: Physician Assistant

## 2023-03-17 ENCOUNTER — Other Ambulatory Visit: Payer: Self-pay | Admitting: *Deleted

## 2023-03-17 ENCOUNTER — Encounter: Payer: Self-pay | Admitting: Physician Assistant

## 2023-03-17 VITALS — BP 170/60 | HR 76 | Ht 60.0 in | Wt 134.4 lb

## 2023-03-17 DIAGNOSIS — I5032 Chronic diastolic (congestive) heart failure: Secondary | ICD-10-CM

## 2023-03-17 DIAGNOSIS — I6523 Occlusion and stenosis of bilateral carotid arteries: Secondary | ICD-10-CM | POA: Insufficient documentation

## 2023-03-17 DIAGNOSIS — I10 Essential (primary) hypertension: Secondary | ICD-10-CM

## 2023-03-17 DIAGNOSIS — N1831 Chronic kidney disease, stage 3a: Secondary | ICD-10-CM

## 2023-03-17 DIAGNOSIS — D649 Anemia, unspecified: Secondary | ICD-10-CM

## 2023-03-17 MED ORDER — ATORVASTATIN CALCIUM 40 MG PO TABS: 40.0000 mg | ORAL_TABLET | Freq: Every day | ORAL | 3 refills | Status: DC

## 2023-03-17 MED ORDER — FENOFIBRATE MICRONIZED 200 MG PO CAPS: 200.0000 mg | ORAL_CAPSULE | Freq: Every day | ORAL | 3 refills | Status: DC

## 2023-03-17 MED ORDER — CLONIDINE HCL 0.1 MG PO TABS: 0.1000 mg | ORAL_TABLET | Freq: Four times a day (QID) | ORAL | 3 refills | Status: DC | PRN

## 2023-03-17 MED ORDER — VALSARTAN 160 MG PO TABS: 160.0000 mg | ORAL_TABLET | Freq: Every day | ORAL | 3 refills | Status: DC

## 2023-03-17 MED ORDER — CHLORTHALIDONE 25 MG PO TABS: 25.0000 mg | ORAL_TABLET | Freq: Every day | ORAL | 3 refills | Status: DC

## 2023-03-17 MED ORDER — AMLODIPINE BESYLATE 10 MG PO TABS: 10.0000 mg | ORAL_TABLET | Freq: Every day | ORAL | 3 refills | Status: DC

## 2023-03-17 NOTE — Assessment & Plan Note (Signed)
Hemoglobin of 9.8 noted on recent blood work with primary care.  This is likely contributing somewhat to her symptoms of weakness.  Continue follow-up with primary care.

## 2023-03-17 NOTE — Assessment & Plan Note (Signed)
40-59% right ICA stenosis in March 2023.   - Arrange follow-up carotid US.   - Continue aspirin 81 mg daily, Lipitor 40 mg daily.

## 2023-03-17 NOTE — Assessment & Plan Note (Signed)
EF 60-65 by echocardiogram September 2024 with moderate diastolic dysfunction.  NYHA IIb-III.  Volume status stable on exam.  Weight is stable. - Continue chlorthalidone 25 mg daily - Consider spironolactone or SGLT2 inhibitor if volume is difficult to control

## 2023-03-17 NOTE — Patient Instructions (Addendum)
Medication Instructions:  Your physician has recommended you make the following change in your medication:   STOP Losartan  START Valsartan 160 mg taking 1 daily  *If you need a refill on your cardiac medications before your next appointment, please call your pharmacy*   Lab Work: 2 WEEKS AT YOUR NEXT APPT WITH PHARM D:  BMET  If you have labs (blood work) drawn today and your tests are completely normal, you will receive your results only by: MyChart Message (if you have MyChart) OR A paper copy in the mail If you have any lab test that is abnormal or we need to change your treatment, we will call you to review the results.   Testing/Procedures: Your physician has requested that you have a carotid duplex. This test is an ultrasound of the carotid arteries in your neck. It looks at blood flow through these arteries that supply the brain with blood. Allow one hour for this exam. There are no restrictions or special instructions.   You have been referred to see  PHARM D in 2 weeks for HTN Follow-Up: At [facility], you and your health needs are our priority.  As part of our continuing mission to provide you with exceptional heart care, we have created designated Provider Care Teams.  These Care Teams include your primary Cardiologist (physician) and Advanced Practice Providers (APPs -  Physician Assistants and Nurse Practitioners) who all work together to provide you with the care you need, when you need it.  We recommend signing up for the patient portal called "MyChart".  Sign up information is provided on this After Visit Summary.  MyChart is used to connect with patients for Virtual Visits (Telemedicine).  Patients are able to view lab/test results, encounter notes, upcoming appointments, etc.  Non-urgent messages can be sent to your provider as well.   To learn more about what you can do with MyChart, go to [email or web address].    Your next appointment:   3  month(s)  Provider:   Armanda Magic, MD  or Tereso Newcomer, PA-C         Other Instructions

## 2023-03-17 NOTE — Assessment & Plan Note (Signed)
GFR 42.  Change losartan to valsartan as noted above.  Repeat BMET 2 weeks.

## 2023-03-17 NOTE — Assessment & Plan Note (Signed)
Uncontrolled. - Discontinue losartan - Start valsartan 160 mg daily - BMET in 2 weeks - Refill amlodipine 10 mg daily - Follow up with hypertension clinic in two weeks - Continue chlorthalidone 25 mg daily, clonidine 0.1 mg every 6 hours as needed for systolic pressure >180, hydralazine 100 mg 3 times a day - Follow up with [name] or me in 3 mos

## 2023-03-18 ENCOUNTER — Telehealth: Payer: Self-pay | Admitting: *Deleted

## 2023-03-18 ENCOUNTER — Other Ambulatory Visit: Payer: Self-pay | Admitting: *Deleted

## 2023-03-18 DIAGNOSIS — Z79899 Other long term (current) drug therapy: Secondary | ICD-10-CM

## 2023-03-18 LAB — BASIC METABOLIC PANEL
BUN/Creatinine Ratio: 36 — ABNORMAL HIGH (ref 12–28)
BUN: 45 mg/dL — ABNORMAL HIGH (ref 8–27)
CO2: 24 mmol/L (ref 20–29)
Calcium: 9.8 mg/dL (ref 8.7–10.3)
Chloride: 104 mmol/L (ref 96–106)
Creatinine, Ser: 1.24 mg/dL — ABNORMAL HIGH (ref 0.57–1.00)
Glucose: 108 mg/dL — ABNORMAL HIGH (ref 70–99)
Potassium: 4 mmol/L (ref 3.5–5.2)
Sodium: 140 mmol/L (ref 134–144)
eGFR: 44 mL/min/{1.73_m2} — ABNORMAL LOW (ref >59)

## 2023-03-18 NOTE — Telephone Encounter (Signed)
-----   Message from Tereso Newcomer sent at 03/18/2023  7:52 AM EST ----- Results sent to [facility]in[facility] Plan via MyChart. See MyChart comments below. PLAN:  -Please arrange BMET in 2 weeks  Ms. Braggs  Your kidney function (creatinine) is stable.  Your potassium is normal.  There is some suggestion that you are dehydrated (BUN/creatinine ratio increased).  Make sure you are drinking plenty of fluids.  We will recheck your labs in 2 weeks. Tereso Newcomer, PA-C

## 2023-03-30 ENCOUNTER — Emergency Department (HOSPITAL_BASED_OUTPATIENT_CLINIC_OR_DEPARTMENT_OTHER): Payer: Medicare PPO

## 2023-03-30 ENCOUNTER — Inpatient Hospital Stay (HOSPITAL_BASED_OUTPATIENT_CLINIC_OR_DEPARTMENT_OTHER)
Admission: EM | Admit: 2023-03-30 | Discharge: 2023-04-02 | DRG: 305 | Disposition: A | Discharge status: Home Health | Payer: Medicare PPO | Attending: Internal Medicine | Admitting: Internal Medicine

## 2023-03-30 ENCOUNTER — Other Ambulatory Visit: Payer: Self-pay

## 2023-03-30 ENCOUNTER — Emergency Department (HOSPITAL_BASED_OUTPATIENT_CLINIC_OR_DEPARTMENT_OTHER): Payer: Medicare PPO | Admitting: Radiology

## 2023-03-30 DIAGNOSIS — D61818 Other pancytopenia: Secondary | ICD-10-CM | POA: Diagnosis present

## 2023-03-30 DIAGNOSIS — W109XXA Fall (on) (from) unspecified stairs and steps, initial encounter: Secondary | ICD-10-CM | POA: Diagnosis present

## 2023-03-30 DIAGNOSIS — E876 Hypokalemia: Secondary | ICD-10-CM | POA: Diagnosis present

## 2023-03-30 DIAGNOSIS — N170 Acute kidney failure with tubular necrosis: Secondary | ICD-10-CM | POA: Diagnosis not present

## 2023-03-30 DIAGNOSIS — Z90710 Acquired absence of both cervix and uterus: Secondary | ICD-10-CM | POA: Diagnosis not present

## 2023-03-30 DIAGNOSIS — I10 Essential (primary) hypertension: Secondary | ICD-10-CM | POA: Diagnosis not present

## 2023-03-30 DIAGNOSIS — I130 Hypertensive heart and chronic kidney disease with heart failure and stage 1 through stage 4 chronic kidney disease, or unspecified chronic kidney disease: Secondary | ICD-10-CM | POA: Diagnosis present

## 2023-03-30 DIAGNOSIS — E7800 Pure hypercholesterolemia, unspecified: Secondary | ICD-10-CM | POA: Diagnosis present

## 2023-03-30 DIAGNOSIS — I5032 Chronic diastolic (congestive) heart failure: Secondary | ICD-10-CM | POA: Diagnosis present

## 2023-03-30 DIAGNOSIS — Z885 Allergy status to narcotic agent status: Secondary | ICD-10-CM

## 2023-03-30 DIAGNOSIS — E890 Postprocedural hypothyroidism: Secondary | ICD-10-CM | POA: Diagnosis present

## 2023-03-30 DIAGNOSIS — Z602 Problems related to living alone: Secondary | ICD-10-CM | POA: Diagnosis present

## 2023-03-30 DIAGNOSIS — Z79899 Other long term (current) drug therapy: Secondary | ICD-10-CM | POA: Diagnosis not present

## 2023-03-30 DIAGNOSIS — E663 Overweight: Secondary | ICD-10-CM | POA: Diagnosis present

## 2023-03-30 DIAGNOSIS — M160 Bilateral primary osteoarthritis of hip: Secondary | ICD-10-CM | POA: Diagnosis not present

## 2023-03-30 DIAGNOSIS — I160 Hypertensive urgency: Secondary | ICD-10-CM | POA: Diagnosis not present

## 2023-03-30 DIAGNOSIS — Z9049 Acquired absence of other specified parts of digestive tract: Secondary | ICD-10-CM

## 2023-03-30 DIAGNOSIS — Z974 Presence of external hearing-aid: Secondary | ICD-10-CM

## 2023-03-30 DIAGNOSIS — Z800 Family history of malignant neoplasm of digestive organs: Secondary | ICD-10-CM | POA: Diagnosis not present

## 2023-03-30 DIAGNOSIS — Z79890 Hormone replacement therapy (postmenopausal): Secondary | ICD-10-CM

## 2023-03-30 DIAGNOSIS — I6523 Occlusion and stenosis of bilateral carotid arteries: Secondary | ICD-10-CM | POA: Diagnosis not present

## 2023-03-30 DIAGNOSIS — G4733 Obstructive sleep apnea (adult) (pediatric): Secondary | ICD-10-CM | POA: Diagnosis present

## 2023-03-30 DIAGNOSIS — R4182 Altered mental status, unspecified: Secondary | ICD-10-CM | POA: Diagnosis not present

## 2023-03-30 DIAGNOSIS — E1122 Type 2 diabetes mellitus with diabetic chronic kidney disease: Secondary | ICD-10-CM | POA: Diagnosis present

## 2023-03-30 DIAGNOSIS — M19011 Primary osteoarthritis, right shoulder: Secondary | ICD-10-CM | POA: Diagnosis not present

## 2023-03-30 DIAGNOSIS — Z888 Allergy status to other drugs, medicaments and biological substances status: Secondary | ICD-10-CM | POA: Diagnosis not present

## 2023-03-30 DIAGNOSIS — Z7982 Long term (current) use of aspirin: Secondary | ICD-10-CM | POA: Diagnosis not present

## 2023-03-30 DIAGNOSIS — N1832 Chronic kidney disease, stage 3b: Secondary | ICD-10-CM | POA: Diagnosis present

## 2023-03-30 DIAGNOSIS — M25511 Pain in right shoulder: Secondary | ICD-10-CM | POA: Diagnosis not present

## 2023-03-30 DIAGNOSIS — Z6827 Body mass index (BMI) 27.0-27.9, adult: Secondary | ICD-10-CM

## 2023-03-30 DIAGNOSIS — N179 Acute kidney failure, unspecified: Secondary | ICD-10-CM | POA: Diagnosis present

## 2023-03-30 DIAGNOSIS — I5030 Unspecified diastolic (congestive) heart failure: Secondary | ICD-10-CM | POA: Diagnosis not present

## 2023-03-30 DIAGNOSIS — H9190 Unspecified hearing loss, unspecified ear: Secondary | ICD-10-CM | POA: Diagnosis present

## 2023-03-30 DIAGNOSIS — Y92009 Unspecified place in unspecified non-institutional (private) residence as the place of occurrence of the external cause: Secondary | ICD-10-CM | POA: Diagnosis not present

## 2023-03-30 DIAGNOSIS — M25552 Pain in left hip: Secondary | ICD-10-CM | POA: Diagnosis not present

## 2023-03-30 LAB — COMPREHENSIVE METABOLIC PANEL
ALT: 10 U/L (ref 0–44)
AST: 27 U/L (ref 15–41)
Albumin: 3.7 g/dL (ref 3.5–5.0)
Alkaline Phosphatase: 25 U/L — ABNORMAL LOW (ref 38–126)
Anion gap: 7 (ref 5–15)
BUN: 42 mg/dL — ABNORMAL HIGH (ref 8–23)
CO2: 29 mmol/L (ref 22–32)
Calcium: 9.7 mg/dL (ref 8.9–10.3)
Chloride: 105 mmol/L (ref 98–111)
Creatinine, Ser: 1.61 mg/dL — ABNORMAL HIGH (ref 0.44–1.00)
GFR, Estimated: 32 mL/min — ABNORMAL LOW (ref >60)
Glucose, Bld: 101 mg/dL — ABNORMAL HIGH (ref 70–99)
Potassium: 4.1 mmol/L (ref 3.5–5.1)
Sodium: 141 mmol/L (ref 135–145)
Total Bilirubin: 0.4 mg/dL (ref <1.2)
Total Protein: 5.8 g/dL — ABNORMAL LOW (ref 6.5–8.1)

## 2023-03-30 LAB — URINALYSIS, ROUTINE W REFLEX MICROSCOPIC
Bacteria, UA: NONE SEEN
Bilirubin Urine: NEGATIVE
Glucose, UA: NEGATIVE mg/dL
Ketones, ur: NEGATIVE mg/dL
Leukocytes,Ua: NEGATIVE
Nitrite: NEGATIVE
Protein, ur: >300 mg/dL — AB
Specific Gravity, Urine: 1.009 (ref 1.005–1.030)
pH: 7 (ref 5.0–8.0)

## 2023-03-30 LAB — CBC WITH DIFFERENTIAL/PLATELET
Abs Immature Granulocytes: 0 10*3/uL (ref 0.00–0.07)
Basophils Absolute: 0 10*3/uL (ref 0.0–0.1)
Basophils Relative: 1 %
Eosinophils Absolute: 0.1 10*3/uL (ref 0.0–0.5)
Eosinophils Relative: 2 %
HCT: 26.6 % — ABNORMAL LOW (ref 36.0–46.0)
Hemoglobin: 8.8 g/dL — ABNORMAL LOW (ref 12.0–15.0)
Immature Granulocytes: 0 %
Lymphocytes Relative: 29 %
Lymphs Abs: 1 10*3/uL (ref 0.7–4.0)
MCH: 29.7 pg (ref 26.0–34.0)
MCHC: 33.1 g/dL (ref 30.0–36.0)
MCV: 89.9 fL (ref 80.0–100.0)
Monocytes Absolute: 0.3 10*3/uL (ref 0.1–1.0)
Monocytes Relative: 9 %
Neutro Abs: 1.9 10*3/uL (ref 1.7–7.7)
Neutrophils Relative %: 59 %
Platelets: 133 10*3/uL — ABNORMAL LOW (ref 150–400)
RBC: 2.96 MIL/uL — ABNORMAL LOW (ref 3.87–5.11)
RDW: 13.9 % (ref 11.5–15.5)
WBC: 3.3 10*3/uL — ABNORMAL LOW (ref 4.0–10.5)
nRBC: 0 % (ref 0.0–0.2)

## 2023-03-30 MED ORDER — HYDRALAZINE HCL 20 MG/ML IJ SOLN
20.0000 mg | Freq: Once | INTRAMUSCULAR | Status: AC
  Administered 2023-03-30: 20 mg via INTRAVENOUS
  Filled 2023-03-30: qty 1

## 2023-03-30 MED ORDER — HYDRALAZINE HCL 20 MG/ML IJ SOLN
10.0000 mg | Freq: Once | INTRAMUSCULAR | Status: AC
  Administered 2023-03-30: 10 mg via INTRAVENOUS

## 2023-03-30 MED ORDER — HYDRALAZINE HCL 20 MG/ML IJ SOLN
10.0000 mg | Freq: Once | INTRAMUSCULAR | Status: AC
  Administered 2023-03-30: 10 mg via INTRAVENOUS
  Filled 2023-03-30: qty 1

## 2023-03-30 MED ORDER — HYDRALAZINE HCL 20 MG/ML IJ SOLN: 10.0000 mg | Freq: Once | INTRAMUSCULAR | Status: DC

## 2023-03-30 NOTE — ED Notes (Signed)
Report called to [place], Charity fundraiser. Awaiting [facility] for transportation.

## 2023-03-30 NOTE — ED Notes (Signed)
PA at bedside.

## 2023-03-30 NOTE — ED Provider Notes (Signed)
[place] EMERGENCY DEPARTMENT AT [facility] Provider Note   CSN: [id number] Arrival date & time: 03/30/23  4098     History  Chief Complaint  Patient presents with   Kimberly [name] is a 82 y.o. female.  Patient to ED with daughter with history of recent fall and report of mild confusion today by another family member. She had a mechanical fall 5 days ago after missing a step while leaving her house and falling forward. No LOC at the time. She was with another family member when she fell. No vomiting since. She lives alone and has been visited by family almost everyday since the fall. Today, her granddaughter became concerned that she was confused earlier today, not making any sense when she spoke. Daughter, with her now, reports no confusion witnessed by her over the last 2 hours she has been with her. The patient denies abdominal pain, chest pain, headache. Not anticoagulated.   The history is provided by the patient and a relative. No language interpreter was used.  Fall       Home Medications Prior to Admission medications   Medication Sig Start Date End Date Taking? Authorizing Provider  albuterol (VENTOLIN HFA) 108 (90 Base) MCG/ACT inhaler Inhalation for 50 Days    [provider]  amLODipine (NORVASC) 10 MG tablet Take 1 tablet (10 mg total) by mouth daily. 03/17/23   Tereso Newcomer T, PA-C  aspirin 81 MG chewable tablet Chew 81 mg by mouth daily.    [provider]  atorvastatin (LIPITOR) 40 MG tablet Take 1 tablet (40 mg total) by mouth daily. 03/17/23   Tereso Newcomer T, PA-C  chlorthalidone (HYGROTON) 25 MG tablet Take 1 tablet (25 mg total) by mouth daily. 03/17/23 06/15/23  Tereso Newcomer T, PA-C  Cholecalciferol (VITAMIN D3) 5000 units CAPS Take 5,000 Units by mouth daily.    [provider]  cloNIDine (CATAPRES) 0.1 MG tablet Take 1 tablet (0.1 mg total) by mouth every 6 (six) hours as needed. If BP is over 180. 03/17/23    Weaver, Lorin Picket T, PA-C  fenofibrate micronized (LOFIBRA) 200 MG capsule Take 1 capsule (200 mg total) by mouth daily before breakfast. 03/17/23   Tereso Newcomer T, PA-C  Fluticasone Propionate, Inhal, 100 MCG/ACT AEPB 1 puff Inhalation Twice a day for 90 days 02/02/23   [provider]  hydrALAZINE (APRESOLINE) 100 MG tablet Take 1 tablet (100 mg total) by mouth 3 (three) times daily. 01/20/23 01/20/24  Pokhrel, Rebekah [place], MD  levothyroxine (SYNTHROID) 137 MCG tablet TAKE ONE TABLET BY MOUTH BEFORE BREAKFAST 09/04/22   Carlus Pavlov, MD  nitroGLYCERIN (NITROSTAT) 0.4 MG SL tablet Place 1 tablet (0.4 mg total) under the tongue every 5 (five) minutes as needed for chest pain. 07/31/21   Quintella Reichert, MD  valsartan (DIOVAN) 160 MG tablet Take 1 tablet (160 mg total) by mouth daily. 03/17/23   Tereso Newcomer T, PA-C      Allergies    Oxycodone-acetaminophen, Percocet [oxycodone-acetaminophen], Codeine, Other, Promethazine hcl, Clonidine derivatives, Hydralazine, Clonidine, and Metoprolol    Review of Systems   Review of Systems  Physical Exam Updated Vital Signs BP (!) 228/67   Pulse (!) 54   Temp 98.6 F (37 C) (Oral)   Resp (!) 21   Ht 5' (1.524 m)   Wt 60.9 kg   SpO2 98%   BMI 26.22 kg/m  Physical Exam Vitals and nursing note reviewed.  Constitutional:  Appearance: Normal appearance.     Comments: Hard of hearing.  HENT:     Head: Normocephalic and atraumatic.     Mouth/Throat:     Mouth: Mucous membranes are moist.  Eyes:     Pupils: Pupils are equal, round, and reactive to light.  Neck:     Comments: No midline cervical tenderness.  Cardiovascular:     Rate and Rhythm: Regular rhythm. Bradycardia present.     Heart sounds: Murmur heard.  Pulmonary:     Effort: Pulmonary effort is normal.     Breath sounds: No wheezing, rhonchi or rales.  Abdominal:     General: There is no distension.     Palpations: Abdomen is soft.     Tenderness: There is no abdominal  tenderness.  Musculoskeletal:        General: Normal range of motion.     Cervical back: Normal range of motion and neck supple.     Right lower leg: No edema.     Left lower leg: No edema.     Comments: Tender of distal right clavicle. No deformity. FROM bilateral UE's. Minimal tenderness over lateral left hip. FROM LE's.   Skin:    General: Skin is warm and dry.     Findings: No bruising.  Neurological:     Mental Status: She is alert and oriented to person, place, and time.     Gait: Gait normal.     Comments: Speech is clear and focused. No facial droop. CN's 3-12 intact. No lateralizing weakness. No pronator drift.   Psychiatric:        Behavior: Behavior normal.     ED Results / Procedures / Treatments   Labs (all labs ordered are listed, but only abnormal results are displayed) Labs Reviewed  CBC WITH DIFFERENTIAL/PLATELET - Abnormal; Notable for the following components:      Result Value   WBC 3.3 (*)    RBC 2.96 (*)    Hemoglobin 8.8 (*)    HCT 26.6 (*)    Platelets 133 (*)    All other components within normal limits  COMPREHENSIVE METABOLIC PANEL - Abnormal; Notable for the following components:   Glucose, Bld 101 (*)    BUN 42 (*)    Creatinine, Ser 1.61 (*)    Total Protein 5.8 (*)    Alkaline Phosphatase 25 (*)    GFR, Estimated 32 (*)    All other components within normal limits  URINALYSIS, ROUTINE W REFLEX MICROSCOPIC - Abnormal; Notable for the following components:   Color, Urine COLORLESS (*)    Hgb urine dipstick TRACE (*)    Protein, ur >300 (*)    All other components within normal limits   Results for orders placed or performed during the hospital encounter of 03/30/23  CBC with Differential  Result Value Ref Range   WBC 3.3 (L) 4.0 - 10.5 K/uL   RBC 2.96 (L) 3.87 - 5.11 MIL/uL   Hemoglobin 8.8 (L) 12.0 - 15.0 g/dL   HCT 40.3 (L) 47.4 - 25.9 %   MCV 89.9 80.0 - 100.0 fL   MCH 29.7 26.0 - 34.0 pg   MCHC 33.1 30.0 - 36.0 g/dL   RDW 56.3  87.5 - 64.3 %   Platelets 133 (L) 150 - 400 K/uL   nRBC 0.0 0.0 - 0.2 %   Neutrophils Relative % 59 %   Neutro Abs 1.9 1.7 - 7.7 K/uL   Lymphocytes Relative 29 %   Lymphs  Abs 1.0 0.7 - 4.0 K/uL   Monocytes Relative 9 %   Monocytes Absolute 0.3 0.1 - 1.0 K/uL   Eosinophils Relative 2 %   Eosinophils Absolute 0.1 0.0 - 0.5 K/uL   Basophils Relative 1 %   Basophils Absolute 0.0 0.0 - 0.1 K/uL   Immature Granulocytes 0 %   Abs Immature Granulocytes 0.00 0.00 - 0.07 K/uL  Comprehensive metabolic panel  Result Value Ref Range   Sodium 141 135 - 145 mmol/L   Potassium 4.1 3.5 - 5.1 mmol/L   Chloride 105 98 - 111 mmol/L   CO2 29 22 - 32 mmol/L   Glucose, Bld 101 (H) 70 - 99 mg/dL   BUN 42 (H) 8 - 23 mg/dL   Creatinine, Ser 1.32 (H) 0.44 - 1.00 mg/dL   Calcium 9.7 8.9 - 44.0 mg/dL   Total Protein 5.8 (L) 6.5 - 8.1 g/dL   Albumin 3.7 3.5 - 5.0 g/dL   AST 27 15 - 41 U/L   ALT 10 0 - 44 U/L   Alkaline Phosphatase 25 (L) 38 - 126 U/L   Total Bilirubin 0.4 <1.2 mg/dL   GFR, Estimated 32 (L) >60 mL/min   Anion gap 7 5 - 15  Urinalysis, Routine w reflex microscopic -Urine, Clean Catch  Result Value Ref Range   Color, Urine COLORLESS (A) YELLOW   APPearance CLEAR CLEAR   Specific Gravity, Urine 1.009 1.005 - 1.030   pH 7.0 5.0 - 8.0   Glucose, UA NEGATIVE NEGATIVE mg/dL   Hgb urine dipstick TRACE (A) NEGATIVE   Bilirubin Urine NEGATIVE NEGATIVE   Ketones, ur NEGATIVE NEGATIVE mg/dL   Protein, ur >102 (A) NEGATIVE mg/dL   Nitrite NEGATIVE NEGATIVE   Leukocytes,Ua NEGATIVE NEGATIVE   RBC / HPF 0-5 0 - 5 RBC/hpf   WBC, UA 0-5 0 - 5 WBC/hpf   Bacteria, UA NONE SEEN NONE SEEN   Squamous Epithelial / HPF 0-5 0 - 5 /HPF     EKG None  Radiology DG Hip Unilat W or Wo Pelvis 2-3 Views Left  Result Date: 03/30/2023 CLINICAL DATA:  Left hip pain following fall, initial encounter EXAM: DG HIP (WITH OR WITHOUT PELVIS) 3V LEFT COMPARISON:  None Available. FINDINGS: Pelvic ring is intact.  Mild degenerative changes of the hip joints are noted bilaterally. No acute fracture or dislocation is noted. No soft tissue abnormality is seen. IMPRESSION: Degenerative change without acute abnormality. Electronically Signed   By: Alcide Clever M.D.   On: 03/30/2023 20:40   DG Shoulder Right  Result Date: 03/30/2023 CLINICAL DATA:  Fall 1 week ago with right shoulder pain, initial encounter EXAM: RIGHT SHOULDER - 2+ VIEW COMPARISON:  None Available. FINDINGS: Degenerative changes of the acromioclavicular and glenohumeral joints are seen. No acute fracture or dislocation is noted. No soft tissue abnormality is seen. IMPRESSION: Degenerative change without acute abnormality. Electronically Signed   By: Alcide Clever M.D.   On: 03/30/2023 20:39   CT Head Wo Contrast  Result Date: 03/30/2023 CLINICAL DATA:  Mental status change, unknown cause EXAM: CT HEAD WITHOUT CONTRAST TECHNIQUE: Contiguous axial images were obtained from the base of the skull through the vertex without intravenous contrast. RADIATION DOSE REDUCTION: This exam was performed according to the departmental dose-optimization program which includes automated exposure control, adjustment of the mA and/or kV according to patient size and/or use of iterative reconstruction technique. COMPARISON:  CT Head 12/24/19 FINDINGS: Brain: No hemorrhage. No hydrocephalus. No extra-axial fluid collection. No mass  effect. No mass lesion. 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 Vascular: No hyperdense vessel or unexpected calcification. Skull: Normal. Negative for fracture or focal lesion. Sinuses/Orbits: No middle ear mastoid effusion. Paranasal sinuses are clear. Mucosal thickening bilateral maxillary sinuses. Bilateral lens replacement. Orbits are otherwise unremarkable. Other: None. IMPRESSION: No acute intracranial abnormality. Electronically Signed   By: Lorenza Cambridge M.D.   On: 03/30/2023 20:30    Procedures Procedures    Medications Ordered in  ED Medications  hydrALAZINE (APRESOLINE) injection 10 mg (10 mg Intravenous Given 03/30/23 1743)  hydrALAZINE (APRESOLINE) injection 10 mg (10 mg Intravenous Given 03/30/23 1858)  hydrALAZINE (APRESOLINE) injection 20 mg (20 mg Intravenous Given 03/30/23 2112)    ED Course/ Medical Decision Making/ A&P                                 Medical Decision Making This patient presents to the ED for concern of confusion, this involves an extensive number of treatment options, and is a complaint that carries with it a high risk of complications and morbidity.  The differential diagnosis includes CVA, head injury, intracranial mass or bleed, infection   Co morbidities that complicate the patient evaluation  Poor memory, HTN, HLD, HFpEF, CKD   Additional history obtained:  Additional history obtained from daughter External records from outside source obtained and reviewed including last cardiology visit 11/5 with Tereso Newcomer, PA-C. Changed HTN medications: D/c Losartan Start Valsartan 160 mg QD Continue: Chlorthalidone 25 mg Clonidine 0.1 mg QID prn BP >180 sys Amlodipine 10 mg  Hydralazine 100 mg TID   Lab Tests:  I Ordered, and personally interpreted labs.  The pertinent results include:  worsening renal function with Cr 1.61, GFR 32 (baseline 1.25, GFR 42). >300 protein in the urine (last compared 2020 100). Hgb 8.8, c/w baseline, no leukocytosis.    Imaging Studies ordered:  I ordered imaging studies including head CT  Radiologist interpretation - no intracranial abnormalities Right shoulder and left hip - unremarkable.   Cardiac Monitoring: / EKG:  The patient was maintained on a cardiac monitor.  I personally viewed and interpreted the cardiac monitored which showed an underlying rhythm of: Bradycardic, appears unchanged from previous   Consultations Obtained:  I requested consultation with the ED attending, Dr. Maple Hudson,  and discussed lab and imaging findings as well  as pertinent plan - they recommend: admission for hypertensive urgency given blood pressure readings in ED, worsening renal function.    Problem List / ED Course / Critical interventions / Medication management  Patient lives alone and does not inform family  of her medical issues, manages her own medications. Daughter has found on different occations that she does not take them correctly Recent admission for hypertensive urgency Here with fall 5 days ago, reported confusion today, however, with significantly elevated systolic pressure today at 260 manual. Favor hypertension as cause of confusion vs the subacute fall.   Blood pressure addressed with IV Hydralazine 10 mg x 2 doses. Best blood pressure 198/59 then increased again to 218/66. She has had bradycardia at 50-53 consistently. Head CT normal. No confusion witnessed in ED over 5.5 hours.     I ordered medication including hydralazine  for HTN  Reevaluation of the patient after these medicines showed that the patient improved but not resolved I have reviewed the patients home medicines and have made adjustments as needed   Social Determinants of Health:  Lives alone, manages her own medications and felt to be unreliable to take them correctly.   Test / Admission - Considered:  Admission for hypertensive urgency. There is also a social issue of whether the patient is able, at this point, to manage her medications as this will be the second admission in less than 3 months for similar situation.     Amount and/or Complexity of Data Reviewed Labs: ordered. Radiology: ordered.  Risk Prescription drug management.           Final Clinical Impression(s) / ED Diagnoses Final diagnoses:  Hypertensive urgency    Rx / DC Orders ED Discharge Orders     None         Elpidio Anis, PA-C 03/30/23 2128    Coral Spikes, DO 03/31/23 1610

## 2023-03-30 NOTE — ED Notes (Signed)
X-ray at bedside

## 2023-03-30 NOTE — ED Notes (Signed)
Patient assisted to bedside commode without difficulty. Sitting in wheelchair awaiting CT scan.

## 2023-03-30 NOTE — ED Notes (Signed)
PT transported to CT>

## 2023-03-30 NOTE — ED Triage Notes (Signed)
Fall last week. Family found patient on floor. Family states patient has been acting "different" since fall. HTN of 240s in triage. No unilateral deficits. Takes ASA but family unsure how compliant patient is.

## 2023-03-31 ENCOUNTER — Encounter (HOSPITAL_COMMUNITY): Payer: Medicare PPO

## 2023-03-31 ENCOUNTER — Encounter (HOSPITAL_COMMUNITY): Payer: Self-pay | Admitting: Internal Medicine

## 2023-03-31 ENCOUNTER — Ambulatory Visit: Payer: Medicare PPO

## 2023-03-31 DIAGNOSIS — Z79890 Hormone replacement therapy (postmenopausal): Secondary | ICD-10-CM | POA: Diagnosis not present

## 2023-03-31 DIAGNOSIS — I5032 Chronic diastolic (congestive) heart failure: Secondary | ICD-10-CM | POA: Diagnosis present

## 2023-03-31 DIAGNOSIS — I6523 Occlusion and stenosis of bilateral carotid arteries: Secondary | ICD-10-CM | POA: Diagnosis not present

## 2023-03-31 DIAGNOSIS — I130 Hypertensive heart and chronic kidney disease with heart failure and stage 1 through stage 4 chronic kidney disease, or unspecified chronic kidney disease: Secondary | ICD-10-CM | POA: Diagnosis present

## 2023-03-31 DIAGNOSIS — Z6827 Body mass index (BMI) 27.0-27.9, adult: Secondary | ICD-10-CM | POA: Diagnosis not present

## 2023-03-31 DIAGNOSIS — I5030 Unspecified diastolic (congestive) heart failure: Secondary | ICD-10-CM | POA: Diagnosis not present

## 2023-03-31 DIAGNOSIS — Z90710 Acquired absence of both cervix and uterus: Secondary | ICD-10-CM | POA: Diagnosis not present

## 2023-03-31 DIAGNOSIS — H9190 Unspecified hearing loss, unspecified ear: Secondary | ICD-10-CM | POA: Diagnosis present

## 2023-03-31 DIAGNOSIS — Z602 Problems related to living alone: Secondary | ICD-10-CM | POA: Diagnosis present

## 2023-03-31 DIAGNOSIS — Z974 Presence of external hearing-aid: Secondary | ICD-10-CM | POA: Diagnosis not present

## 2023-03-31 DIAGNOSIS — Z800 Family history of malignant neoplasm of digestive organs: Secondary | ICD-10-CM | POA: Diagnosis not present

## 2023-03-31 DIAGNOSIS — Z7982 Long term (current) use of aspirin: Secondary | ICD-10-CM | POA: Diagnosis not present

## 2023-03-31 DIAGNOSIS — Z885 Allergy status to narcotic agent status: Secondary | ICD-10-CM | POA: Diagnosis not present

## 2023-03-31 DIAGNOSIS — N179 Acute kidney failure, unspecified: Secondary | ICD-10-CM | POA: Diagnosis present

## 2023-03-31 DIAGNOSIS — Z888 Allergy status to other drugs, medicaments and biological substances status: Secondary | ICD-10-CM | POA: Diagnosis not present

## 2023-03-31 DIAGNOSIS — E1122 Type 2 diabetes mellitus with diabetic chronic kidney disease: Secondary | ICD-10-CM | POA: Diagnosis present

## 2023-03-31 DIAGNOSIS — E876 Hypokalemia: Secondary | ICD-10-CM | POA: Diagnosis present

## 2023-03-31 DIAGNOSIS — E890 Postprocedural hypothyroidism: Secondary | ICD-10-CM | POA: Diagnosis not present

## 2023-03-31 DIAGNOSIS — N170 Acute kidney failure with tubular necrosis: Secondary | ICD-10-CM | POA: Diagnosis not present

## 2023-03-31 DIAGNOSIS — W109XXA Fall (on) (from) unspecified stairs and steps, initial encounter: Secondary | ICD-10-CM | POA: Diagnosis present

## 2023-03-31 DIAGNOSIS — Z79899 Other long term (current) drug therapy: Secondary | ICD-10-CM | POA: Diagnosis not present

## 2023-03-31 DIAGNOSIS — I10 Essential (primary) hypertension: Secondary | ICD-10-CM | POA: Diagnosis not present

## 2023-03-31 DIAGNOSIS — E663 Overweight: Secondary | ICD-10-CM | POA: Diagnosis present

## 2023-03-31 DIAGNOSIS — Z9049 Acquired absence of other specified parts of digestive tract: Secondary | ICD-10-CM | POA: Diagnosis not present

## 2023-03-31 DIAGNOSIS — Y92009 Unspecified place in unspecified non-institutional (private) residence as the place of occurrence of the external cause: Secondary | ICD-10-CM | POA: Diagnosis not present

## 2023-03-31 DIAGNOSIS — G4733 Obstructive sleep apnea (adult) (pediatric): Secondary | ICD-10-CM | POA: Diagnosis not present

## 2023-03-31 DIAGNOSIS — D61818 Other pancytopenia: Secondary | ICD-10-CM | POA: Diagnosis present

## 2023-03-31 DIAGNOSIS — N1832 Chronic kidney disease, stage 3b: Secondary | ICD-10-CM | POA: Diagnosis not present

## 2023-03-31 DIAGNOSIS — I160 Hypertensive urgency: Secondary | ICD-10-CM | POA: Diagnosis not present

## 2023-03-31 DIAGNOSIS — E7800 Pure hypercholesterolemia, unspecified: Secondary | ICD-10-CM | POA: Diagnosis present

## 2023-03-31 LAB — DIFFERENTIAL
Abs Immature Granulocytes: 0.02 10*3/uL (ref 0.00–0.07)
Basophils Absolute: 0 10*3/uL (ref 0.0–0.1)
Basophils Relative: 1 %
Eosinophils Absolute: 0.1 10*3/uL (ref 0.0–0.5)
Eosinophils Relative: 2 %
Immature Granulocytes: 0 %
Lymphocytes Relative: 16 %
Lymphs Abs: 0.8 10*3/uL (ref 0.7–4.0)
Monocytes Absolute: 0.4 10*3/uL (ref 0.1–1.0)
Monocytes Relative: 8 %
Neutro Abs: 3.7 10*3/uL (ref 1.7–7.7)
Neutrophils Relative %: 73 %

## 2023-03-31 LAB — IRON AND TIBC
Iron: 42 ug/dL (ref 28–170)
Saturation Ratios: 10 % — ABNORMAL LOW (ref 10.4–31.8)
TIBC: 440 ug/dL (ref 250–450)
UIBC: 398 ug/dL

## 2023-03-31 LAB — FOLATE: Folate: 10.4 ng/mL (ref >5.9)

## 2023-03-31 LAB — BASIC METABOLIC PANEL
Anion gap: 10 (ref 5–15)
BUN: 36 mg/dL — ABNORMAL HIGH (ref 8–23)
CO2: 23 mmol/L (ref 22–32)
Calcium: 9.3 mg/dL (ref 8.9–10.3)
Chloride: 107 mmol/L (ref 98–111)
Creatinine, Ser: 1.31 mg/dL — ABNORMAL HIGH (ref 0.44–1.00)
GFR, Estimated: 41 mL/min — ABNORMAL LOW (ref >60)
Glucose, Bld: 94 mg/dL (ref 70–99)
Potassium: 3.4 mmol/L — ABNORMAL LOW (ref 3.5–5.1)
Sodium: 140 mmol/L (ref 135–145)

## 2023-03-31 LAB — CBC
HCT: 29.5 % — ABNORMAL LOW (ref 36.0–46.0)
Hemoglobin: 9.8 g/dL — ABNORMAL LOW (ref 12.0–15.0)
MCH: 29.5 pg (ref 26.0–34.0)
MCHC: 33.2 g/dL (ref 30.0–36.0)
MCV: 88.9 fL (ref 80.0–100.0)
Platelets: 153 10*3/uL (ref 150–400)
RBC: 3.32 MIL/uL — ABNORMAL LOW (ref 3.87–5.11)
RDW: 13.9 % (ref 11.5–15.5)
WBC: 5 10*3/uL (ref 4.0–10.5)
nRBC: 0 % (ref 0.0–0.2)

## 2023-03-31 LAB — RETICULOCYTES
Immature Retic Fract: 8.2 % (ref 2.3–15.9)
RBC.: 3.32 MIL/uL — ABNORMAL LOW (ref 3.87–5.11)
Retic Count, Absolute: 50.1 10*3/uL (ref 19.0–186.0)
Retic Ct Pct: 1.5 % (ref 0.4–3.1)

## 2023-03-31 LAB — TECHNOLOGIST SMEAR REVIEW: Plt Morphology: NORMAL

## 2023-03-31 LAB — FERRITIN: Ferritin: 134 ng/mL (ref 11–307)

## 2023-03-31 LAB — URIC ACID: Uric Acid, Serum: 6.1 mg/dL (ref 2.5–7.1)

## 2023-03-31 LAB — VITAMIN B12: Vitamin B-12: 383 pg/mL (ref 180–914)

## 2023-03-31 LAB — SARS CORONAVIRUS 2 BY RT PCR: SARS Coronavirus 2 by RT PCR: NEGATIVE

## 2023-03-31 LAB — LACTATE DEHYDROGENASE: LDH: 199 U/L — ABNORMAL HIGH (ref 98–192)

## 2023-03-31 MED ORDER — CLONIDINE HCL 0.2 MG/24HR TD PTWK
0.2000 mg | MEDICATED_PATCH | TRANSDERMAL | Status: DC
  Administered 2023-03-31: 0.2 mg via TRANSDERMAL
  Filled 2023-03-31: qty 1

## 2023-03-31 MED ORDER — ALBUTEROL SULFATE (2.5 MG/3ML) 0.083% IN NEBU: 2.5000 mg | INHALATION_SOLUTION | RESPIRATORY_TRACT | Status: DC | PRN

## 2023-03-31 MED ORDER — ACETAMINOPHEN 325 MG PO TABS
650.0000 mg | ORAL_TABLET | Freq: Four times a day (QID) | ORAL | Status: DC | PRN
  Administered 2023-04-01: 650 mg via ORAL
  Filled 2023-03-31: qty 2

## 2023-03-31 MED ORDER — HEPARIN SODIUM (PORCINE) 5000 UNIT/ML IJ SOLN
5000.0000 [IU] | Freq: Three times a day (TID) | INTRAMUSCULAR | Status: DC
  Administered 2023-03-31 – 2023-04-02 (×6): 5000 [IU] via SUBCUTANEOUS
  Filled 2023-03-31 (×7): qty 1

## 2023-03-31 MED ORDER — SENNA 8.6 MG PO TABS: 1.0000 | ORAL_TABLET | Freq: Every day | ORAL | Status: DC | PRN

## 2023-03-31 MED ORDER — AMLODIPINE BESYLATE 10 MG PO TABS
10.0000 mg | ORAL_TABLET | Freq: Every day | ORAL | Status: DC
  Administered 2023-03-31 – 2023-04-02 (×3): 10 mg via ORAL
  Filled 2023-03-31 (×3): qty 1

## 2023-03-31 MED ORDER — HYDRALAZINE HCL 50 MG PO TABS
100.0000 mg | ORAL_TABLET | Freq: Three times a day (TID) | ORAL | Status: DC
  Administered 2023-03-31 – 2023-04-02 (×7): 100 mg via ORAL
  Filled 2023-03-31 (×8): qty 2

## 2023-03-31 MED ORDER — SODIUM CHLORIDE 0.9% FLUSH
3.0000 mL | Freq: Two times a day (BID) | INTRAVENOUS | Status: DC
  Administered 2023-03-31 – 2023-04-02 (×6): 3 mL via INTRAVENOUS

## 2023-03-31 MED ORDER — HYDRALAZINE HCL 20 MG/ML IJ SOLN
10.0000 mg | INTRAMUSCULAR | Status: DC | PRN
  Administered 2023-03-31: 10 mg via INTRAVENOUS
  Filled 2023-03-31: qty 1

## 2023-03-31 MED ORDER — FENTANYL CITRATE PF 50 MCG/ML IJ SOSY: 12.5000 ug | PREFILLED_SYRINGE | INTRAMUSCULAR | Status: DC | PRN

## 2023-03-31 MED ORDER — FENOFIBRATE 54 MG PO TABS: 54.0000 mg | ORAL_TABLET | Freq: Every day | ORAL | Status: DC

## 2023-03-31 MED ORDER — ONDANSETRON HCL 4 MG PO TABS: 4.0000 mg | ORAL_TABLET | Freq: Four times a day (QID) | ORAL | Status: DC | PRN

## 2023-03-31 MED ORDER — ONDANSETRON HCL 4 MG/2ML IJ SOLN: 4.0000 mg | Freq: Four times a day (QID) | INTRAMUSCULAR | Status: DC | PRN

## 2023-03-31 MED ORDER — ACETAMINOPHEN 650 MG RE SUPP: 650.0000 mg | Freq: Four times a day (QID) | RECTAL | Status: DC | PRN

## 2023-03-31 MED ORDER — CLONIDINE HCL 0.1 MG PO TABS
0.1000 mg | ORAL_TABLET | Freq: Four times a day (QID) | ORAL | Status: DC | PRN
  Filled 2023-03-31: qty 1

## 2023-03-31 MED ORDER — POTASSIUM CHLORIDE CRYS ER 20 MEQ PO TBCR
40.0000 meq | EXTENDED_RELEASE_TABLET | Freq: Once | ORAL | Status: AC
  Administered 2023-03-31: 40 meq via ORAL
  Filled 2023-03-31: qty 2

## 2023-03-31 MED ORDER — LEVOTHYROXINE SODIUM 25 MCG PO TABS
137.0000 ug | ORAL_TABLET | Freq: Every day | ORAL | Status: DC
  Administered 2023-03-31 – 2023-04-02 (×3): 137 ug via ORAL
  Filled 2023-03-31 (×3): qty 1

## 2023-03-31 MED ORDER — ASPIRIN 81 MG PO CHEW
81.0000 mg | CHEWABLE_TABLET | Freq: Every day | ORAL | Status: DC
  Administered 2023-03-31 – 2023-04-02 (×3): 81 mg via ORAL
  Filled 2023-03-31 (×3): qty 1

## 2023-03-31 MED ORDER — SODIUM CHLORIDE 0.9 % IV SOLN: INTRAVENOUS | Status: DC

## 2023-03-31 MED ORDER — ATORVASTATIN CALCIUM 40 MG PO TABS
40.0000 mg | ORAL_TABLET | Freq: Every day | ORAL | Status: DC
  Administered 2023-03-31 – 2023-04-02 (×3): 40 mg via ORAL
  Filled 2023-03-31 (×3): qty 1

## 2023-03-31 NOTE — H&P (Signed)
History and Physical    Kimberly [name]:[id number] DOB: 1941-03-25 DOA: 03/30/2023  PCP: Inez Pilgrim, NP   Patient coming from: Home   Chief Complaint: Acting different   HPI: Kimberly [name] is a 82 y.o. female with medical history significant for hypertension, hyperlipidemia, hypothyroidism, chronic HFpEF, OSA on CPAP, and CKD 3B who was brought into the ED for evaluation of mild confusion.   Patient's granddaughter felt that the patient was mildly confused this morning.  Patient's daughter later arrived in the emergency department, was with the patient for 2 hours, and felt that she was at her baseline mental status.  Her blood pressure has been difficult to control and she was admitted to the hospital in September with hypertensive crisis.  Hydralazine was increased and chlorthalidone added at that time.  She saw a cardiology PA two weeks ago who switched her losartan to valsartan.   Patient denies headache, chest pain, or shortness of breath.  She has had rhinorrhea and sinus congestion.  No fevers reported.  [facility] ED Course: Upon arrival to the ED, patient is found to be afebrile, saturating well on room air, bradycardic, and severely hypertensive.  EKG demonstrates sinus bradycardia.  Head CT was negative for acute intracranial abnormality.  Labs are most notable for BUN 42, creatinine 1.61, WBC 3300, and platelets 133,000.   Patient was treated with IV hydralazine in the ED and transferred to [facility] for admission.  Review of Systems:  All other systems reviewed and apart from HPI, are negative.  Past Medical History:  Diagnosis Date   Acid reflux    Anxiety    Arthritis    Back spasm    Bradycardia    a. junctional and sinus brady during admission 06/2017 requiring cessation of beta blocker and clonidine.   Carotid artery stenosis    Carotid Dopplers 07/2021 showed 40 to 59% right ICA stenosis and 1 to 39% left ICA stenosis.  The right  subclavian artery flow is disturbed.   Chronic diastolic heart failure (HCC) 03/20/2015   Chronic edema    CKD (chronic kidney disease), stage III (HCC)    Depression    Diabetes mellitus without complication (HCC)    diet controled   Fuch's endothelial dystrophy    bilateral eyes   Headache    occ   HOH (hard of hearing)    wears hearing aid in left ear   Hypercholesteremia    Hypertensive heart disease with CHF (HCC) 09/11/2014   Insomnia    Junctional bradycardia    Mild mitral regurgitation    Mild tricuspid regurgitation    Normal coronary arteries 2010   a. 2010 cath with no obstructive epicardial disease.  Done for CP which was felt to be due to microvascular angina or diastolic HF.   OSA on CPAP    Followed by Dr. Fannie Knee   Patient's other noncompliance with medication regimen    per notes, gets confused easily with medications, difficult compliance     Past Surgical History:  Procedure Laterality Date   ABDOMINAL HYSTERECTOMY     APPENDECTOMY     CARDIAC CATHETERIZATION     no CAD   CESAREAN SECTION     x 2   CHOLECYSTECTOMY     COLONOSCOPY     EYE SURGERY Right    implant   ROTATOR CUFF REPAIR Left    SHOULDER ARTHROSCOPY WITH SUBACROMIAL DECOMPRESSION AND BICEP TENDON REPAIR Left 08/24/2012   Procedure: SHOULDER  ARTHROSCOPY WITH SUBACROMIAL DECOMPRESSION AND BICEP TENDON REPAIR;  Surgeon: Cammy Copa, MD;  Location: [facility] OR;  Service: Orthopedics;  Laterality: Left;  Left Shoulder Arthroscopy, Debridement, Biceps Tenotomy   THYROIDECTOMY N/A 06/12/2015   Procedure: TOTAL THYROIDECTOMY;  Surgeon: Darnell Level, MD;  Location: [facility] OR;  Service: General;  Laterality: N/A;    Social History:   reports that she has never smoked. She has never used smokeless tobacco. She reports that she does not drink alcohol and does not use drugs.  Allergies  Allergen Reactions   Oxycodone-Acetaminophen Shortness Of Breath, Itching and Other (See Comments)    Pt  couldn't breathe or catch her breath   Percocet [Oxycodone-Acetaminophen] Shortness Of Breath and Other (See Comments)    Pt couldn't breathe or catch her breath   Codeine Itching and Other (See Comments)    hyperactivity    Other Other (See Comments)    Some pain medication given at [facility] for surgery caused hallucinations    Promethazine Hcl Nausea And Vomiting and Other (See Comments)    Hallucinations     Clonidine Derivatives Other (See Comments)    Bradycardia (06/2017)   Hydralazine Other (See Comments)    Headache/dizzy    Clonidine Palpitations    Bradycardia (06/2017)   Metoprolol Palpitations    Bradycardia 06/2017     Family History  Problem Relation Age of Onset   Stomach cancer Mother    Cancer Sister        intestinal     Prior to Admission medications   Medication Sig Start Date End Date Taking? Authorizing Provider  albuterol (VENTOLIN HFA) 108 (90 Base) MCG/ACT inhaler Inhalation for 50 Days    [provider]  amLODipine (NORVASC) 10 MG tablet Take 1 tablet (10 mg total) by mouth daily. 03/17/23   Tereso Newcomer T, PA-C  aspirin 81 MG chewable tablet Chew 81 mg by mouth daily.    [provider]  atorvastatin (LIPITOR) 40 MG tablet Take 1 tablet (40 mg total) by mouth daily. 03/17/23   Tereso Newcomer T, PA-C  chlorthalidone (HYGROTON) 25 MG tablet Take 1 tablet (25 mg total) by mouth daily. 03/17/23 06/15/23  Tereso Newcomer T, PA-C  Cholecalciferol (VITAMIN D3) 5000 units CAPS Take 5,000 Units by mouth daily.    [provider]  cloNIDine (CATAPRES) 0.1 MG tablet Take 1 tablet (0.1 mg total) by mouth every 6 (six) hours as needed. If BP is over 180. 03/17/23   Weaver, Lorin Picket T, PA-C  fenofibrate micronized (LOFIBRA) 200 MG capsule Take 1 capsule (200 mg total) by mouth daily before breakfast. 03/17/23   Tereso Newcomer T, PA-C  Fluticasone Propionate, Inhal, 100 MCG/ACT AEPB 1 puff Inhalation Twice a day for 90 days 02/02/23    [provider]  hydrALAZINE (APRESOLINE) 100 MG tablet Take 1 tablet (100 mg total) by mouth 3 (three) times daily. 01/20/23 01/20/24  Pokhrel, Rebekah Chesterfield, MD  levothyroxine (SYNTHROID) 137 MCG tablet TAKE ONE TABLET BY MOUTH BEFORE BREAKFAST 09/04/22   Carlus Pavlov, MD  nitroGLYCERIN (NITROSTAT) 0.4 MG SL tablet Place 1 tablet (0.4 mg total) under the tongue every 5 (five) minutes as needed for chest pain. 07/31/21   Quintella Reichert, MD  valsartan (DIOVAN) 160 MG tablet Take 1 tablet (160 mg total) by mouth daily. 03/17/23   Tereso Newcomer T, PA-C    Physical Exam: Vitals:   03/30/23 2245 03/31/23 0022 03/31/23 0141 03/31/23 0148  BP: (!) 219/57 (!) 222/60 (!) 186/103  Pulse: (!) 56 (!) 53 (!) 115   Resp: 19     Temp:  98.4 F (36.9 C)    TempSrc:  Oral    SpO2: 98% 99% 98%   Weight:    63.5 kg  Height:        Constitutional: NAD, no pallor or diaphoresis   Eyes: PERTLA, lids and conjunctivae normal ENMT: Mucous membranes are moist. Posterior pharynx clear of any exudate or lesions.   Neck: supple, no masses  Respiratory: no wheezing, no crackles. No accessory muscle use.  Cardiovascular: S1 & S2 heard, regular rate and rhythm. No JVD. Abdomen: No distension, no tenderness, soft. Bowel sounds active.  Musculoskeletal: no clubbing / cyanosis. No joint deformity upper and lower extremities.   Skin: no significant rashes, lesions, ulcers. Warm, dry, well-perfused. Neurologic: CN 2-12 grossly intact aside from gross hearing deficit. Moving all extremities. Alert and oriented to person, place, and situation.  Psychiatric: Calm. Cooperative.    Labs and Imaging on Admission: I have personally reviewed following labs and imaging studies  CBC: Recent Labs  Lab 03/30/23 1724  WBC 3.3*  NEUTROABS 1.9  HGB 8.8*  HCT 26.6*  MCV 89.9  PLT 133*   Basic Metabolic Panel: Recent Labs  Lab 03/30/23 1724  NA 141  K 4.1  CL 105  CO2 29  GLUCOSE 101*  BUN 42*  CREATININE  1.61*  CALCIUM 9.7   GFR: Estimated Creatinine Clearance: 22.8 mL/min (A) (by C-G formula based on SCr of 1.61 mg/dL (H)). Liver Function Tests: Recent Labs  Lab 03/30/23 1724  AST 27  ALT 10  ALKPHOS 25*  BILITOT 0.4  PROT 5.8*  ALBUMIN 3.7   No results for input(s): "LIPASE", "AMYLASE" in the last 168 hours. No results for input(s): "AMMONIA" in the last 168 hours. Coagulation Profile: No results for input(s): "INR", "PROTIME" in the last 168 hours. Cardiac Enzymes: No results for input(s): "CKTOTAL", "CKMB", "CKMBINDEX", "TROPONINI" in the last 168 hours. BNP (last 3 results) No results for input(s): "PROBNP" in the last 8760 hours. HbA1C: No results for input(s): "HGBA1C" in the last 72 hours. CBG: No results for input(s): "GLUCAP" in the last 168 hours. Lipid Profile: No results for input(s): "CHOL", "HDL", "LDLCALC", "TRIG", "CHOLHDL", "LDLDIRECT" in the last 72 hours. Thyroid Function Tests: No results for input(s): "TSH", "T4TOTAL", "FREET4", "T3FREE", "THYROIDAB" in the last 72 hours. Anemia Panel: No results for input(s): "VITAMINB12", "FOLATE", "FERRITIN", "TIBC", "IRON", "RETICCTPCT" in the last 72 hours. Urine analysis:    Component Value Date/Time   COLORURINE COLORLESS (A) 03/30/2023 1724   APPEARANCEUR CLEAR 03/30/2023 1724   LABSPEC 1.009 03/30/2023 1724   PHURINE 7.0 03/30/2023 1724   GLUCOSEU NEGATIVE 03/30/2023 1724   HGBUR TRACE (A) 03/30/2023 1724   BILIRUBINUR NEGATIVE 03/30/2023 1724   KETONESUR NEGATIVE 03/30/2023 1724   PROTEINUR >300 (A) 03/30/2023 1724   NITRITE NEGATIVE 03/30/2023 1724   LEUKOCYTESUR NEGATIVE 03/30/2023 1724   Sepsis Labs: @LABRCNTIP (procalcitonin:4,lacticidven:4) )No results found for this or any previous visit (from the past 240 hour(s)).   Radiological Exams on Admission: DG Hip Unilat W or Wo Pelvis 2-3 Views Left  Result Date: 03/30/2023 CLINICAL DATA:  Left hip pain following fall, initial encounter EXAM: DG  HIP (WITH OR WITHOUT PELVIS) 3V LEFT COMPARISON:  None Available. FINDINGS: Pelvic ring is intact. Mild degenerative changes of the hip joints are noted bilaterally. No acute fracture or dislocation is noted. No soft tissue abnormality is seen. IMPRESSION: Degenerative change without acute  abnormality. Electronically Signed   By: Alcide Clever M.D.   On: 03/30/2023 20:40   DG Shoulder Right  Result Date: 03/30/2023 CLINICAL DATA:  Fall 1 week ago with right shoulder pain, initial encounter EXAM: RIGHT SHOULDER - 2+ VIEW COMPARISON:  None Available. FINDINGS: Degenerative changes of the acromioclavicular and glenohumeral joints are seen. No acute fracture or dislocation is noted. No soft tissue abnormality is seen. IMPRESSION: Degenerative change without acute abnormality. Electronically Signed   By: Alcide Clever M.D.   On: 03/30/2023 20:39   CT Head Wo Contrast  Result Date: 03/30/2023 CLINICAL DATA:  Mental status change, unknown cause EXAM: CT HEAD WITHOUT CONTRAST TECHNIQUE: Contiguous axial images were obtained from the base of the skull through the vertex without intravenous contrast. RADIATION DOSE REDUCTION: This exam was performed according to the departmental dose-optimization program which includes automated exposure control, adjustment of the mA and/or kV according to patient size and/or use of iterative reconstruction technique. COMPARISON:  CT Head 12/24/19 FINDINGS: Brain: No hemorrhage. No hydrocephalus. No extra-axial fluid collection. No mass effect. No mass lesion. 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 Vascular: No hyperdense vessel or unexpected calcification. Skull: Normal. Negative for fracture or focal lesion. Sinuses/Orbits: No middle ear mastoid effusion. Paranasal sinuses are clear. Mucosal thickening bilateral maxillary sinuses. Bilateral lens replacement. Orbits are otherwise unremarkable. Other: None. IMPRESSION: No acute intracranial abnormality. Electronically Signed   By:  Lorenza Cambridge M.D.   On: 03/30/2023 20:30    EKG: Independently reviewed. Sinus bradycardia, rate 45.   Assessment/Plan   1. Hypertensive crisis  - BP as high as 260/90 in ED  - Mild AKI could be secondary to this but no hematuria noted, and no other evidence for end-organ damage  - Continue Norvasc and hydralazine, use clonidine as-needed   2. AKI on CKD 3B  - SCr is 1.61 on admission up from 1.24 two weeks ago  - Hold ARB and chlorthalidone, hydrate with IVF, renally-dose medications, repeat chem panel in am   3. Pancytopenia  - Hgb appears stable at 8.8 but new leukopenia and thrombocytopenia noted  - Request smear review, check reticulocyte count, LDH, and uric acid, repeat CBC in am    4. Chronic HFpEF  - Appears compensated  - Monitor volume status   5. Hypothyroidism  - Synthroid    DVT prophylaxis: sq heparin  Code Status: Full  Level of Care: Level of care: Telemetry Medical Family Communication: Daughter updated from ED   Disposition Plan:  Patient is from: Home  Anticipated d/c is to: TBD Anticipated d/c date is: 04/02/23  Patient currently: Pending BP-control, stable renal function and blood counts  Consults called: None  Admission status: Inpatient     Briscoe Deutscher, MD [facility]  03/31/2023, 3:22 AM

## 2023-03-31 NOTE — Progress Notes (Signed)
PROGRESS NOTE    DELAYNIE [name]  BJS:[id number] DOB: [birth year] DOA: 03/30/2023 PCP: Inez Pilgrim, NP    Brief Narrative:  Kimberly [name] is a 82 y.o. female with medical history significant for hypertension, hyperlipidemia, hypothyroidism, chronic HFpEF, OSA on CPAP, and CKD 3B who was brought into the ED for evaluation of mild confusion.    Patient's granddaughter felt that the patient was mildly confused this morning.  Patient's daughter later arrived in the emergency department, was with the patient for 2 hours, and felt that she was at her baseline mental status.  Her blood pressure has been difficult to control and she was admitted to the hospital in September with hypertensive crisis.  Hydralazine was increased and chlorthalidone added at that time.  She saw a cardiology PA two weeks ago who switched her losartan to valsartan.   Assessment and Plan:  Hypertensive crisis  - BP as high as 260/90 in ED  - Mild AKI could be secondary to this but no hematuria noted, and no other evidence for end-organ damage  - Continue Norvasc and hydralazine-- reviewed prior cardiology recommendations-- trial of clonidine patch recommended along with renal artery duplex ordered for the AM-- monitor for bradycardia    AKI on CKD 3B  - SCr is 1.61 on admission up from 1.24 two weeks ago  - Hold ARB and chlorthalidone -trend    Pancytopenia  - Hgb appears stable at 8.8 but new leukopenia and thrombocytopenia noted - now resolved - Request smear review   Chronic HFpEF  - Appears compensated  - Monitor volume status    Hypothyroidism  - Synthroid   Hypokalemia -replete   DVT prophylaxis: heparin injection 5,000 Units Start: 03/31/23 1400    Code Status: Full Code   Disposition Plan:  Level of care: Telemetry Medical Status is: Inpatient Remains inpatient appropriate     Consultants:  none   Subjective: Does not have hearing aides currently  Objective: Vitals:   03/31/23  0148 03/31/23 0414 03/31/23 0814 03/31/23 0927  BP:  (!) 173/44 (!) 200/71 (!) 154/42  Pulse:  68 (!) 59 64  Resp:  17 18   Temp:  98.7 F (37.1 C) 98.9 F (37.2 C)   TempSrc:   Oral   SpO2:  97% 100% 99%  Weight: 63.5 kg     Height:        Intake/Output Summary (Last 24 hours) at 03/31/2023 1210 Last data filed at 03/31/2023 1018 Gross per 24 hour  Intake 118 ml  Output --  Net 118 ml   Filed Weights   03/30/23 1617 03/31/23 0148  Weight: 60.9 kg 63.5 kg    Examination:   General: Appearance:     Overweight female in no acute distress     Lungs:     respirations unlabored  Heart:    Normal heart rate    MS:   All extremities are intact.    Neurologic:   Awake, alert, VERY HARD OF HEARING       Data Reviewed: I have personally reviewed following labs and imaging studies  CBC: Recent Labs  Lab 03/30/23 1724 03/31/23 0638  WBC 3.3* 5.0  NEUTROABS 1.9 3.7  HGB 8.8* 9.8*  HCT 26.6* 29.5*  MCV 89.9 88.9  PLT 133* 153   Basic Metabolic Panel: Recent Labs  Lab 03/30/23 1724 03/31/23 0638  NA 141 140  K 4.1 3.4*  CL 105 107  CO2 29 23  GLUCOSE 101* 94  BUN  42* 36*  CREATININE 1.61* 1.31*  CALCIUM 9.7 9.3   GFR: Estimated Creatinine Clearance: 28 mL/min (A) (by C-G formula based on SCr of 1.31 mg/dL (H)). Liver Function Tests: Recent Labs  Lab 03/30/23 1724  AST 27  ALT 10  ALKPHOS 25*  BILITOT 0.4  PROT 5.8*  ALBUMIN 3.7   No results for input(s): "LIPASE", "AMYLASE" in the last 168 hours. No results for input(s): "AMMONIA" in the last 168 hours. Coagulation Profile: No results for input(s): "INR", "PROTIME" in the last 168 hours. Cardiac Enzymes: No results for input(s): "CKTOTAL", "CKMB", "CKMBINDEX", "TROPONINI" in the last 168 hours. BNP (last 3 results) No results for input(s): "PROBNP" in the last 8760 hours. HbA1C: No results for input(s): "HGBA1C" in the last 72 hours. CBG: No results for input(s): "GLUCAP" in the last 168  hours. Lipid Profile: No results for input(s): "CHOL", "HDL", "LDLCALC", "TRIG", "CHOLHDL", "LDLDIRECT" in the last 72 hours. Thyroid Function Tests: No results for input(s): "TSH", "T4TOTAL", "FREET4", "T3FREE", "THYROIDAB" in the last 72 hours. Anemia Panel: Recent Labs    03/31/23 0638  VITAMINB12 383  FOLATE 10.4  FERRITIN 134  TIBC 440  IRON 42  RETICCTPCT 1.5   Sepsis Labs: No results for input(s): "PROCALCITON", "LATICACIDVEN" in the last 168 hours.  Recent Results (from the past 240 hour(s))  SARS Coronavirus 2 by RT PCR (hospital order, performed in [facility] hospital lab) *cepheid single result test* Anterior Nasal Swab     Status: None   Collection Time: 03/31/23  3:49 AM   Specimen: Anterior Nasal Swab  Result Value Ref Range Status   SARS Coronavirus 2 by RT PCR NEGATIVE NEGATIVE Final    Comment: Performed at [facility], [address], [place], [place] [address]         Radiology Studies: DG Hip Unilat W or Wo Pelvis 2-3 Views Left  Result Date: 03/30/2023 CLINICAL DATA:  Left hip pain following fall, initial encounter EXAM: DG HIP (WITH OR WITHOUT PELVIS) 3V LEFT COMPARISON:  None Available. FINDINGS: Pelvic ring is intact. Mild degenerative changes of the hip joints are noted bilaterally. No acute fracture or dislocation is noted. No soft tissue abnormality is seen. IMPRESSION: Degenerative change without acute abnormality. Electronically Signed   By: Alcide Clever M.D.   On: 03/30/2023 20:40   DG Shoulder Right  Result Date: 03/30/2023 CLINICAL DATA:  Fall 1 week ago with right shoulder pain, initial encounter EXAM: RIGHT SHOULDER - 2+ VIEW COMPARISON:  None Available. FINDINGS: Degenerative changes of the acromioclavicular and glenohumeral joints are seen. No acute fracture or dislocation is noted. No soft tissue abnormality is seen. IMPRESSION: Degenerative change without acute abnormality. Electronically Signed   By: Alcide Clever M.D.   On:  03/30/2023 20:39   CT Head Wo Contrast  Result Date: 03/30/2023 CLINICAL DATA:  Mental status change, unknown cause EXAM: CT HEAD WITHOUT CONTRAST TECHNIQUE: Contiguous axial images were obtained from the base of the skull through the vertex without intravenous contrast. RADIATION DOSE REDUCTION: This exam was performed according to the departmental dose-optimization program which includes automated exposure control, adjustment of the mA and/or kV according to patient size and/or use of iterative reconstruction technique. COMPARISON:  CT Head 12/24/19 FINDINGS: Brain: No hemorrhage. No hydrocephalus. No extra-axial fluid collection. No mass effect. No mass lesion. 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 Vascular: No hyperdense vessel or unexpected calcification. Skull: Normal. Negative for fracture or focal lesion. Sinuses/Orbits: No middle ear mastoid effusion.  Paranasal sinuses are clear. Mucosal thickening bilateral maxillary sinuses. Bilateral lens replacement. Orbits are otherwise unremarkable. Other: None. IMPRESSION: No acute intracranial abnormality. Electronically Signed   By: Lorenza Cambridge M.D.   On: 03/30/2023 20:30        Scheduled Meds:  amLODipine  10 mg Oral Daily   aspirin  81 mg Oral Daily   atorvastatin  40 mg Oral Daily   cloNIDine  0.2 mg Transdermal Weekly   heparin  5,000 Units Subcutaneous Q8H   hydrALAZINE  100 mg Oral Q8H   levothyroxine  137 mcg Oral Q0600   sodium chloride flush  3 mL Intravenous Q12H   Continuous Infusions:   LOS: 0 days    Time spent: 45 minutes spent on chart review, discussion with nursing staff, consultants, updating family and interview/physical exam; more than 50% of that time was spent in counseling and/or coordination of care.    Joseph Art, DO [facility] Available via Epic secure chat 7am-7pm After these hours, please refer to coverage provider listed on [email or web address] 03/31/2023, 12:10 PM

## 2023-04-01 ENCOUNTER — Inpatient Hospital Stay (HOSPITAL_COMMUNITY): Payer: Medicare PPO

## 2023-04-01 DIAGNOSIS — I160 Hypertensive urgency: Secondary | ICD-10-CM | POA: Diagnosis not present

## 2023-04-01 DIAGNOSIS — G4733 Obstructive sleep apnea (adult) (pediatric): Secondary | ICD-10-CM

## 2023-04-01 DIAGNOSIS — N170 Acute kidney failure with tubular necrosis: Secondary | ICD-10-CM

## 2023-04-01 DIAGNOSIS — I5030 Unspecified diastolic (congestive) heart failure: Secondary | ICD-10-CM

## 2023-04-01 DIAGNOSIS — I10 Essential (primary) hypertension: Secondary | ICD-10-CM | POA: Diagnosis not present

## 2023-04-01 LAB — CBC
HCT: 29.8 % — ABNORMAL LOW (ref 36.0–46.0)
Hemoglobin: 9.6 g/dL — ABNORMAL LOW (ref 12.0–15.0)
MCH: 29.1 pg (ref 26.0–34.0)
MCHC: 32.2 g/dL (ref 30.0–36.0)
MCV: 90.3 fL (ref 80.0–100.0)
Platelets: 168 10*3/uL (ref 150–400)
RBC: 3.3 MIL/uL — ABNORMAL LOW (ref 3.87–5.11)
RDW: 14 % (ref 11.5–15.5)
WBC: 4.3 10*3/uL (ref 4.0–10.5)
nRBC: 0 % (ref 0.0–0.2)

## 2023-04-01 LAB — BASIC METABOLIC PANEL
Anion gap: 7 (ref 5–15)
BUN: 36 mg/dL — ABNORMAL HIGH (ref 8–23)
CO2: 26 mmol/L (ref 22–32)
Calcium: 9.8 mg/dL (ref 8.9–10.3)
Chloride: 108 mmol/L (ref 98–111)
Creatinine, Ser: 1.35 mg/dL — ABNORMAL HIGH (ref 0.44–1.00)
GFR, Estimated: 39 mL/min — ABNORMAL LOW (ref >60)
Glucose, Bld: 94 mg/dL (ref 70–99)
Potassium: 3.8 mmol/L (ref 3.5–5.1)
Sodium: 141 mmol/L (ref 135–145)

## 2023-04-01 LAB — TSH: TSH: 7.391 u[IU]/mL — ABNORMAL HIGH (ref 0.350–4.500)

## 2023-04-01 LAB — CORTISOL: Cortisol, Plasma: 17.7 ug/dL

## 2023-04-01 MED ORDER — POTASSIUM CHLORIDE CRYS ER 20 MEQ PO TBCR
40.0000 meq | EXTENDED_RELEASE_TABLET | Freq: Once | ORAL | Status: AC
  Administered 2023-04-01: 40 meq via ORAL
  Filled 2023-04-01: qty 2

## 2023-04-01 MED ORDER — ISOSORBIDE MONONITRATE ER 30 MG PO TB24
30.0000 mg | ORAL_TABLET | Freq: Every day | ORAL | Status: DC
  Administered 2023-04-01 – 2023-04-02 (×2): 30 mg via ORAL
  Filled 2023-04-01 (×2): qty 1

## 2023-04-01 NOTE — Progress Notes (Addendum)
[facility]                                                                              Kimberly [name], is a 82 y.o. female, DOB - 1940/09/09, ION:[id number] Admit date - 03/30/2023    Outpatient Primary MD for the patient is Inez Pilgrim, NP  LOS - 1  days  Chief Complaint  Patient presents with   Fall       Brief summary   Patient is a 82 year old female with hypertension, hyperlipidemia, hypothyroidism,chronic HFpEF, OSA on CPAP, and CKD 3B presented to ED with mild confusion.  Patient's granddaughter felt that patient was mildly confused on the morning of admission however patient's daughter who arrived later in the ED felt that she was close to her baseline mental status.  Patient's blood pressure has been difficult to control and she was admitted to the hospital in September with hypertensive crisis. Hydralazine was increased and chlorthalidone added at that time. She saw a cardiology PA two weeks ago who switched her losartan to valsartan.    Assessment & Plan    Principal Problem:   Hypertensive urgency -BP as high as 260/90 in ED, AKI likely due to hypertensive crisis, no hematuria noted and no other evidence for endorgan damage -Continue Norvasc 10 mg daily, hydralazine 100 mg 3 times daily, clonidine patch 0.2 mg  TD/weekly -Valsartan currently on hold due to AKI, - workup in progress for other differentials including renovascular hypertension, pheochromocytoma -TSH 7.3 continue Synthroid, cortisol normal -Follow metanephrines plasma and urine, aldosterone, renin -BP 181/63, this morning, improving, add Imdur 30 mg daily -Follow renal artery duplex Addendum: 2:10 PM Renal artery duplex negative for any stenosis bilaterally -Patient's daughter requested to get the carotid ultrasound inpatient that was scheduled outpatient on 04/08/2023 (was ordered by cardiology)  Active Problems: AKI on CKD 3B  - SCr is 1.61 on admission, up from 1.24 on  03/17/2023 -Valsartan and chlorthalidone currently on held due to AKI -Creatinine now improving, 1.3    Pancytopenia  - Hgb appears stable at 8.8 but new leukopenia and thrombocytopenia noted - now resolved -Resolved   Chronic HFpEF  - Appears compensated  - Monitor volume status    Hypothyroidism  -TSH 7.3, continue Synthroid   Hypokalemia -Replace as needed    Obstructive sleep apnea -Continue CPAP at bedtime  Estimated body mass index is 27.56 kg/m as calculated from the following:   Height as of this encounter: 5' (1.524 m).   Weight as of this encounter: 64 kg.  Code Status: Full code DVT Prophylaxis:  heparin injection 5,000 Units Start: 03/31/23 1400   Level of Care: Level of care: Telemetry Medical Family Communication: Updated patient's daughter, Pam Disposition Plan:      Remains inpatient appropriate: Possible DC tomorrow if workup negative and BP improving   Procedures:    Consultants:     Antimicrobials:   Anti-infectives (From admission, onward)    None          Medications  amLODipine  10 mg Oral Daily   aspirin  81 mg Oral Daily   atorvastatin  40 mg Oral Daily  cloNIDine  0.2 mg Transdermal Weekly   heparin  5,000 Units Subcutaneous Q8H   hydrALAZINE  100 mg Oral Q8H   levothyroxine  137 mcg Oral Q0600   sodium chloride flush  3 mL Intravenous Q12H      Subjective:   Kimberly [name] was seen and examined today.  BP still elevated although improving from admission.  Patient denies dizziness, chest pain, shortness of breath, abdominal pain, N/V. No acute events overnight.    Objective:   Vitals:   03/31/23 2357 04/01/23 0455 04/01/23 0500 04/01/23 0753  BP: (!) 174/57 (!) 187/61  (!) 181/63  Pulse: 68 67  66  Resp:      Temp: 98.9 F (37.2 C) 98.7 F (37.1 C)  98.4 F (36.9 C)  TempSrc: Oral Oral    SpO2: 99% 100%  99%  Weight:   64 kg   Height:       No intake or output data in the 24 hours ending 04/01/23 1216   Wt  Readings from Last 3 Encounters:  04/01/23 64 kg  03/17/23 61 kg  01/20/23 60.8 kg     Exam General: Alert and oriented x 3, NAD Cardiovascular: S1 S2 auscultated,  RRR Respiratory: Clear to auscultation bilaterally, no wheezing Gastrointestinal: Soft, NT, ND, NBS Ext: no pedal edema bilaterally Neuro: no new deficits Psych: Normal affect     Data Reviewed:  I have personally reviewed following labs    CBC Lab Results  Component Value Date   WBC 4.3 04/01/2023   RBC 3.30 (L) 04/01/2023   HGB 9.6 (L) 04/01/2023   HCT 29.8 (L) 04/01/2023   MCV 90.3 04/01/2023   MCH 29.1 04/01/2023   PLT 168 04/01/2023   MCHC 32.2 04/01/2023   RDW 14.0 04/01/2023   LYMPHSABS 0.8 03/31/2023   MONOABS 0.4 03/31/2023   EOSABS 0.1 03/31/2023   BASOSABS 0.0 03/31/2023     Last metabolic panel Lab Results  Component Value Date   NA 141 04/01/2023   K 3.8 04/01/2023   CL 108 04/01/2023   CO2 26 04/01/2023   BUN 36 (H) 04/01/2023   CREATININE 1.35 (H) 04/01/2023   GLUCOSE 94 04/01/2023   GFRNONAA 39 (L) 04/01/2023   GFRAA 36 (L) 12/23/2019   CALCIUM 9.8 04/01/2023   PHOS 3.2 12/09/2013   PROT 5.8 (L) 03/30/2023   ALBUMIN 3.7 03/30/2023   LABGLOB 2.4 02/21/2019   AGRATIO 1.8 02/21/2019   BILITOT 0.4 03/30/2023   ALKPHOS 25 (L) 03/30/2023   AST 27 03/30/2023   ALT 10 03/30/2023   ANIONGAP 7 04/01/2023    CBG (last 3)  No results for input(s): "GLUCAP" in the last 72 hours.    Coagulation Profile: No results for input(s): "INR", "PROTIME" in the last 168 hours.   Radiology Studies: I have personally reviewed the imaging studies  DG Hip Unilat W or Wo Pelvis 2-3 Views Left  Result Date: 03/30/2023 CLINICAL DATA:  Left hip pain following fall, initial encounter EXAM: DG HIP (WITH OR WITHOUT PELVIS) 3V LEFT COMPARISON:  None Available. FINDINGS: Pelvic ring is intact. Mild degenerative changes of the hip joints are noted bilaterally. No acute fracture or dislocation is  noted. No soft tissue abnormality is seen. IMPRESSION: Degenerative change without acute abnormality. Electronically Signed   By: Alcide Clever M.D.   On: 03/30/2023 20:40   DG Shoulder Right  Result Date: 03/30/2023 CLINICAL DATA:  Fall 1 week ago with right shoulder pain, initial encounter EXAM: RIGHT SHOULDER -  2+ VIEW COMPARISON:  None Available. FINDINGS: Degenerative changes of the acromioclavicular and glenohumeral joints are seen. No acute fracture or dislocation is noted. No soft tissue abnormality is seen. IMPRESSION: Degenerative change without acute abnormality. Electronically Signed   By: Alcide Clever M.D.   On: 03/30/2023 20:39   CT Head Wo Contrast  Result Date: 03/30/2023 CLINICAL DATA:  Mental status change, unknown cause EXAM: CT HEAD WITHOUT CONTRAST TECHNIQUE: Contiguous axial images were obtained from the base of the skull through the vertex without intravenous contrast. RADIATION DOSE REDUCTION: This exam was performed according to the departmental dose-optimization program which includes automated exposure control, adjustment of the mA and/or kV according to patient size and/or use of iterative reconstruction technique. COMPARISON:  CT Head 12/24/19 FINDINGS: Brain: No hemorrhage. No hydrocephalus. No extra-axial fluid collection. No mass effect. No mass lesion. 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 Vascular: No hyperdense vessel or unexpected calcification. Skull: Normal. Negative for fracture or focal lesion. Sinuses/Orbits: No middle ear mastoid effusion. Paranasal sinuses are clear. Mucosal thickening bilateral maxillary sinuses. Bilateral lens replacement. Orbits are otherwise unremarkable. Other: None. IMPRESSION: No acute intracranial abnormality. Electronically Signed   By: Lorenza Cambridge M.D.   On: 03/30/2023 20:30       Kimberly [name] M.D. [facility] 04/01/2023, 12:16 PM  Available via Epic secure chat 7am-7pm After 7 pm, please refer to night coverage  provider listed on amion.

## 2023-04-01 NOTE — Progress Notes (Addendum)
Transition of Care [facility]) - Inpatient Brief Assessment   Patient Details  Name: Kimberly [name] MRN: [id number] Date of Birth: [birth year]  Transition of Care [facility]) CM/SW Contact:    Janae Bridgeman, RN Phone Number: 04/01/2023, 12:23 PM   Clinical Narrative: CM met with the patient and daughter, [name] at the bedside to discuss TOC needs for home.  The patient lives alone and has intermittent family visits for assistance and transportation needs.  The daughter states that the patient lost her husband 2 years ago and has been sleeping a lot during the day and may have been getting her medications confused at the home.  The patient is active with [facility] physician group and has had [facility] RN in the community but no longer has this service.  The daughter states that [facility] RN would be helpful and also plans to look into other senior options for the patient to have better supervision in the home or possibly move in with other family.  Patient's daughter was offered Medicare choice regarding home health services for [facility] RN and MSW and the daughter did not have a preference.  I will reach out to [facility] to secure [facility] RN to assist with medication compliance and home safety if needed.  The daughter was provided with [employer] card as well to assist with senior services in the community.  [facility] was called and home health services for RN and MSW are set up.  No other TOC needs at this time.   Transition of Care Asessment: Insurance and Status: (P) Insurance coverage has been reviewed Patient has primary care physician: (P) Yes Home environment has been reviewed: (P) From home alone Prior level of function:: (P) family provides assistance and intermittant visits during the week Prior/Current Home Services: (P) No current home services Social Determinants of Health Reivew: (P) SDOH reviewed needs interventions Readmission risk has been reviewed: (P) Yes Transition of care needs: (P)  transition of care needs identified, TOC will continue to follow

## 2023-04-01 NOTE — Evaluation (Signed)
Physical Therapy Evaluation Patient Details Name: Kimberly [name] MRN: [id number] DOB: 1941/02/23 Today's Date: 04/01/2023  History of Present Illness  82 y.o. female brought to ED 11/18 for evaluation of mild confusion. Admitted for hypertensive crisis and AKI on CKD 3BPMH: hypertension, hyperlipidemia, hypothyroidism, chronic HFpEF, OSA on CPAP, and CKD 3B  Clinical Impression  PTA pt living alone in single story home with 3 steps to enter (first step down out of kitchen much higher than the other 2 which pt reports was cause of her fall). Pt family and neighbors check on her daily and help with iADLs. Pt is independent with ambulation, using Rollator for community ambulation, and is independent with bADLs, and some iADLs. Pt is limited in safe mobility by continued elevated BP and increased O2 demand, in present of mild generalized weakness. Pt is currently supervision for bed mobility, transfers and ambulation with RW. Pt will have no further PT or equipment needs at discharge. PT will see acutely for stair training and will refer to Mobility Specialist to maintain mobility        If plan is discharge home, recommend the following: A little help with bathing/dressing/bathroom;Assistance with cooking/housework;Assist for transportation;Help with stairs or ramp for entrance   Can travel by private vehicle    Yes    Equipment Recommendations None recommended by PT     Functional Status Assessment Patient has had a recent decline in their functional status and demonstrates the ability to make significant improvements in function in a reasonable and predictable amount of time.     Precautions / Restrictions Precautions Precautions: Fall Precaution Comments: fell down her steps immediately prior to hospitalization Restrictions Weight Bearing Restrictions: No      Mobility  Bed Mobility               General bed mobility comments: sitting EoB on entry    Transfers Overall  transfer level: Needs assistance Equipment used: Rolling walker (2 wheels) Transfers: Sit to/from Stand Sit to Stand: Supervision           General transfer comment: good power up and self steady    Ambulation/Gait Ambulation/Gait assistance: Supervision Gait Distance (Feet): 150 Feet Assistive device: Rolling walker (2 wheels) Gait Pattern/deviations: Step-through pattern, WFL(Within Functional Limits), Shuffle Gait velocity: slowed Gait velocity interpretation: <1.8 ft/sec, indicate of risk for recurrent falls   General Gait Details: supervision for slowed, steady, shuffling gait     Balance Overall balance assessment: Mild deficits observed, not formally tested                                           Pertinent Vitals/Pain Pain Assessment Pain Assessment: Faces Faces Pain Scale: Hurts a little bit Pain Location: L hip from fall, with mobility Pain Descriptors / Indicators: Aching, Sore Pain Intervention(s): Limited activity within patient's tolerance, Monitored during session, Repositioned    Home Living Family/patient expects to be discharged to:: Private residence Living Arrangements: Alone Available Help at Discharge: Available PRN/intermittently;Family;Neighbor Type of Home: House Home Access: Stairs to enter Entrance Stairs-Rails: None Entrance Stairs-Number of Steps: 3 (one larger than the other 2)   Home Layout: One level Home Equipment: Rollator (4 wheels);Grab bars - toilet;Grab bars - tub/shower;Hand held shower head;Tub bench;BSC/3in1      Prior Function Prior Level of Function : Independent/Modified Independent  Mobility Comments: uses furniture sometimes to stabilize in the house and Rollator for limited community ambulation ADLs Comments: granddaughter assists with cooking and cleaning, pt does all bADLs and some iADLs,     Extremity/Trunk Assessment   Upper Extremity Assessment Upper Extremity Assessment:  Generalized weakness    Lower Extremity Assessment Lower Extremity Assessment: Generalized weakness    Cervical / Trunk Assessment Cervical / Trunk Assessment: Kyphotic  Communication   Communication Communication: Hearing impairment (hearing aids in room did not find on cabinet until end of session) Cueing Techniques: Gestural cues;Tactile cues;Visual cues  Cognition Arousal: Alert Behavior During Therapy: WFL for tasks assessed/performed Overall Cognitive Status: Within Functional Limits for tasks assessed                                          General Comments General comments (skin integrity, edema, etc.): on 2L O2 via [place] on entry, with ambulation SpO2 98%O2 on 2L, HR 68, BP after ambulaton 188/62        Assessment/Plan    PT Assessment Patient needs continued PT services  PT Problem List Decreased strength;Decreased mobility       PT Treatment Interventions DME instruction;Gait training;Stair training;Functional mobility training;Therapeutic activities;Therapeutic exercise;Balance training;Cognitive remediation;Patient/family education    PT Goals (Current goals can be found in the Care Plan section)  Acute Rehab PT Goals Patient Stated Goal: go home soon PT Goal Formulation: With patient Time For Goal Achievement: 04/15/23 Potential to Achieve Goals: Good    Frequency Min 1X/week        AM-PAC PT "6 Clicks" Mobility  Outcome Measure Help needed turning from your back to your side while in a flat bed without using bedrails?: None Help needed moving from lying on your back to sitting on the side of a flat bed without using bedrails?: None Help needed moving to and from a bed to a chair (including a wheelchair)?: None Help needed standing up from a chair using your arms (e.g., wheelchair or bedside chair)?: None Help needed to walk in hospital room?: None Help needed climbing 3-5 steps with a railing? : A Little 6 Click Score: 23    End  of Session Equipment Utilized During Treatment: Oxygen Activity Tolerance: Patient tolerated treatment well Patient left: in bed;with call bell/phone within reach;with bed alarm set Nurse Communication: Mobility status PT Visit Diagnosis: Muscle weakness (generalized) (M62.81)    Time: 7829-5621 PT Time Calculation (min) (ACUTE ONLY): 30 min   Charges:   PT Evaluation $PT Eval Low Complexity: 1 Low PT Treatments $Therapeutic Exercise: 8-22 mins PT General Charges $$ ACUTE PT VISIT: 1 Visit         Clyde Zarrella B. Beverely Risen PT, DPT Acute Rehabilitation Services Please use secure chat or  Call Office [phone]   Elon Alas [employer] 04/01/2023, 9:13 AM

## 2023-04-02 ENCOUNTER — Inpatient Hospital Stay (HOSPITAL_COMMUNITY): Payer: Medicare PPO

## 2023-04-02 ENCOUNTER — Ambulatory Visit: Payer: Medicare PPO

## 2023-04-02 DIAGNOSIS — I5030 Unspecified diastolic (congestive) heart failure: Secondary | ICD-10-CM | POA: Diagnosis not present

## 2023-04-02 DIAGNOSIS — I6523 Occlusion and stenosis of bilateral carotid arteries: Secondary | ICD-10-CM

## 2023-04-02 DIAGNOSIS — N170 Acute kidney failure with tubular necrosis: Secondary | ICD-10-CM | POA: Diagnosis not present

## 2023-04-02 DIAGNOSIS — I160 Hypertensive urgency: Secondary | ICD-10-CM | POA: Diagnosis not present

## 2023-04-02 DIAGNOSIS — E890 Postprocedural hypothyroidism: Secondary | ICD-10-CM | POA: Diagnosis not present

## 2023-04-02 LAB — BASIC METABOLIC PANEL
Anion gap: 7 (ref 5–15)
BUN: 42 mg/dL — ABNORMAL HIGH (ref 8–23)
CO2: 22 mmol/L (ref 22–32)
Calcium: 9.3 mg/dL (ref 8.9–10.3)
Chloride: 108 mmol/L (ref 98–111)
Creatinine, Ser: 1.55 mg/dL — ABNORMAL HIGH (ref 0.44–1.00)
GFR, Estimated: 33 mL/min — ABNORMAL LOW (ref >60)
Glucose, Bld: 95 mg/dL (ref 70–99)
Potassium: 4 mmol/L (ref 3.5–5.1)
Sodium: 137 mmol/L (ref 135–145)

## 2023-04-02 LAB — CBC
HCT: 26.3 % — ABNORMAL LOW (ref 36.0–46.0)
Hemoglobin: 8.6 g/dL — ABNORMAL LOW (ref 12.0–15.0)
MCH: 29.3 pg (ref 26.0–34.0)
MCHC: 32.7 g/dL (ref 30.0–36.0)
MCV: 89.5 fL (ref 80.0–100.0)
Platelets: 137 10*3/uL — ABNORMAL LOW (ref 150–400)
RBC: 2.94 MIL/uL — ABNORMAL LOW (ref 3.87–5.11)
RDW: 14.1 % (ref 11.5–15.5)
WBC: 3.6 10*3/uL — ABNORMAL LOW (ref 4.0–10.5)
nRBC: 0 % (ref 0.0–0.2)

## 2023-04-02 MED ORDER — ISOSORBIDE MONONITRATE ER 30 MG PO TB24: 30.0000 mg | ORAL_TABLET | Freq: Every day | ORAL | 3 refills | Status: DC

## 2023-04-02 MED ORDER — ONDANSETRON HCL 4 MG PO TABS: 4.0000 mg | ORAL_TABLET | Freq: Three times a day (TID) | ORAL | 0 refills | Status: AC | PRN

## 2023-04-02 MED ORDER — CLONIDINE 0.2 MG/24HR TD PTWK: 0.2000 mg | MEDICATED_PATCH | TRANSDERMAL | 12 refills | Status: DC

## 2023-04-02 NOTE — Progress Notes (Signed)
Patient and daughter had received discharge instructions to include medications and follow up appointments. Patient's daughter verbalizes understanding of the information provided.

## 2023-04-02 NOTE — Discharge Summary (Addendum)
Physician Discharge Summary   Patient: Kimberly [name] MRN: [id number] DOB: 1941-01-04  Admit date:     03/30/2023  Discharge date: 04/02/23  Discharge Physician: Thad Ranger, MD    PCP: Inez Pilgrim, NP   Recommendations at discharge:   Hold chlorthalidone, valsartan, clonidine, Lasix due to AKI Recheck labs, bmet in the follow-up appointment Placed on clonidine patch 0.2 mg weekly Added Imdur 30 mg daily Patient to follow-up with cardiology for further adjustments in her antihypertensives dosing. Please obtain BMET at the follow-up appointment  Follow renal, aldosterone, plasma metanephrines, urine metanephrines, catecholamines results.  Discharge Diagnoses:    Hypertensive urgency    Acute renal failure superimposed on stage 3b chronic kidney disease (HCC)   Obstructive sleep apnea   (HFpEF) heart failure with preserved ejection fraction (HCC)   hypothyroidism   Hypokalemia   Pancytopenia [facility])    Hospital Course:   Patient is a 82 year old female with hypertension, hyperlipidemia, hypothyroidism,chronic HFpEF, OSA on CPAP, and CKD 3B presented to ED with mild confusion.  Patient's granddaughter felt that patient was mildly confused on the morning of admission however patient's daughter who arrived later in the ED felt that she was close to her baseline mental status.  Patient's blood pressure has been difficult to control and she was admitted to the hospital in September with hypertensive crisis. Hydralazine was increased and chlorthalidone added at that time. She saw a cardiology PA two weeks ago who switched her losartan to valsartan.   Assessment and Plan:   Hypertensive urgency -BP as high as 260/90 in ED, AKI likely due to hypertensive crisis, no hematuria noted and no other evidence for endorgan damage -Continue Norvasc 10 mg daily, hydralazine 100 mg 3 times daily (patient was taking once daily), started on clonidine patch 0.2 mg  TD/weekly, Imdur 30 mg  daily -Valsartan, chlorthalidone currently on hold due to AKI - workup in progress for other differentials including renovascular hypertension, pheochromocytoma -TSH 7.3 continue Synthroid, cortisol normal.  - Plasma metanephrine, renin, aldosterone in process.  Urine metanephrines, catecholamines also collected, follow results. -Renal artery duplex negative for any stenosis bilaterally -Patient's daughter requested to get the carotid ultrasound inpatient that was scheduled outpatient on 04/08/2023 (was ordered by cardiology).  Carotid Dopplers completed, no critical stenosis.    AKI superimposed on chronic kidney disease stage 3B  - SCr is 1.61 on admission, up from 1.24 on 03/17/2023 -Valsartan and chlorthalidone currently on held due to AKI -Creatinine 1.5 at discharge -Follow bmet     Pancytopenia  - Hgb appears stable at 8.8 but new leukopenia and thrombocytopenia noted - now resolved -Resolved   Chronic HFpEF  - Appears compensated  - Monitor volume status    Hypothyroidism  -TSH 7.3, continue Synthroid   Hypokalemia -Replace as needed     Obstructive sleep apnea -Continue CPAP at bedtime   Estimated body mass index is 27.56 kg/m as calculated from the following:   Height as of this encounter: 5' (1.524 m).   Weight as of this encounter: 64 kg.   PT evaluation recommended no PT follow-up      Pain control - [facility] Controlled Substance Reporting System database was reviewed. and patient was instructed, not to drive, operate heavy machinery, perform activities at heights, swimming or participation in water activities or provide baby-sitting services while on Pain, Sleep and Anxiety Medications; until their outpatient Physician has advised to do so again. Also recommended to not to take more than prescribed Pain, Sleep and  Anxiety Medications.  Consultants: None Procedures performed: None Disposition: Home Diet recommendation:  Discharge Diet Orders (From  admission, onward)     Start     Ordered   04/02/23 0000  Diet - low sodium heart healthy        04/02/23 1230            DISCHARGE MEDICATION: Allergies as of 04/02/2023       Reactions   Oxycodone-acetaminophen Shortness Of Breath, Itching, Other (See Comments)   Pt couldn't breathe or catch her breath   Percocet [oxycodone-acetaminophen] Shortness Of Breath, Other (See Comments)   Pt couldn't breathe or catch her breath   Codeine Itching, Other (See Comments)   hyperactivity   Other Other (See Comments)   Some pain medication given at [facility] for surgery caused hallucinations    Promethazine Hcl Nausea And Vomiting, Other (See Comments)   Hallucinations   Clonidine Derivatives Other (See Comments)   Bradycardia (06/2017)   Hydralazine Other (See Comments)   Headache/dizzy   Clonidine Palpitations   Bradycardia (06/2017)   Metoprolol Palpitations   Bradycardia 06/2017        Medication List     STOP taking these medications    chlorthalidone 25 MG tablet Commonly known as: HYGROTON   cloNIDine 0.1 MG tablet Commonly known as: CATAPRES   fenofibrate micronized 200 MG capsule Commonly known as: LOFIBRA   furosemide 20 MG tablet Commonly known as: LASIX   valsartan 160 MG tablet Commonly known as: Diovan       TAKE these medications    acetaminophen 500 MG tablet Commonly known as: TYLENOL Take 1,000 mg by mouth every 6 (six) hours as needed for mild pain (pain score 1-3) or moderate pain (pain score 4-6).   albuterol 108 (90 Base) MCG/ACT inhaler Commonly known as: VENTOLIN HFA Inhale 1 puff into the lungs every 4 (four) hours as needed for wheezing or shortness of breath.   amLODipine 10 MG tablet Commonly known as: NORVASC Take 1 tablet (10 mg total) by mouth daily.   aspirin 81 MG chewable tablet Chew 81 mg by mouth daily.   atorvastatin 40 MG tablet Commonly known as: LIPITOR Take 1 tablet (40 mg total) by mouth daily.    cloNIDine 0.2 mg/24hr patch Commonly known as: CATAPRES - Dosed in mg/24 hr Place 1 patch (0.2 mg total) onto the skin once a week. Start taking on: April 07, 2023   Fluticasone Propionate (Inhal) 100 MCG/ACT Aepb 1 puff Inhalation Twice a day for 90 days   hydrALAZINE 100 MG tablet Commonly known as: APRESOLINE Take 1 tablet (100 mg total) by mouth 3 (three) times daily. What changed: when to take this   isosorbide mononitrate 30 MG 24 hr tablet Commonly known as: IMDUR Take 1 tablet (30 mg total) by mouth daily. Start taking on: April 03, 2023   levothyroxine 137 MCG tablet Commonly known as: SYNTHROID TAKE ONE TABLET BY MOUTH BEFORE BREAKFAST   nitroGLYCERIN 0.4 MG SL tablet Commonly known as: NITROSTAT Place 1 tablet (0.4 mg total) under the tongue every 5 (five) minutes as needed for chest pain.   ondansetron 4 MG tablet Commonly known as: ZOFRAN Take 1 tablet (4 mg total) by mouth every 8 (eight) hours as needed for nausea or vomiting.   PARoxetine 20 MG tablet Commonly known as: PAXIL Take 20 mg by mouth daily.   Vitamin D3 125 MCG (5000 UT) Caps Take 5,000 Units by mouth daily.  Follow-up Information     [facility] Follow up.   Specialty: Home Health Services Why: [facility] will provide home health services.  They will call you in the next 24-48 hours to set up services. Contact information: [address] [place] [address] [phone]         Inez Pilgrim, NP. Schedule an appointment as soon as possible for a visit in 2 week(s).   Specialty: Family Medicine Why: for hospital follow-up, obtain labs BMET Contact information: [address] [place] [address] [phone]         Tereso Newcomer T, PA-C. Schedule an appointment as soon as possible for a visit in 10 day(s).   Specialties: Cardiology, Physician Assistant Why: for hospital follow-up to adjust your blood pressure  medications Contact information: [address] [place] [address] [phone]                Discharge Exam: Ceasar Mons Weights   03/31/23 0148 04/01/23 0500 04/02/23 0500  Weight: 63.5 kg 64 kg 63.8 kg   S: Feels a lot better today, BP improving, wants to go home.  No overnight issues, nausea vomiting, abdominal pain or chest pain.  BP (!) 145/44 (BP Location: Left Arm)   Pulse 68   Temp 97.6 F (36.4 C) (Oral)   Resp 18   Ht 5' (1.524 m)   Wt 63.8 kg   SpO2 99%   BMI 27.46 kg/m   Physical Exam General: Alert and oriented x 3, NAD Cardiovascular: S1 S2 clear, RRR.  Respiratory: CTAB, no wheezing, Gastrointestinal: Soft, nontender, nondistended, NBS Ext: no pedal edema bilaterally Neuro: no new deficits Psych: Normal affect     Condition at discharge: fair  The results of significant diagnostics from this hospitalization (including imaging, microbiology, ancillary and laboratory) are listed below for reference.   Imaging Studies: VAS US RENAL ARTERY DUPLEX  Result Date: 04/01/2023 ABDOMINAL VISCERAL Patient Name:  BENECIA ASTON Berroa  Date of Exam:   04/01/2023 Medical Rec #: [id number]       Accession #:    [id number] Date of Birth: 03/03/1941       Patient Gender: F Patient Age:   82 years Exam Location:  [facility] Procedure:      VAS US RENAL ARTERY DUPLEX Referring Phys: [id number] JESSICA U VANN -------------------------------------------------------------------------------- High Risk Factors: Hypertension, hyperlipidemia. Limitations: Air/bowel gas. Comparison Study: No prior exam. Performing Technologist: Fernande Bras  Examination Guidelines: A complete evaluation includes B-mode imaging, spectral Doppler, color Doppler, and power Doppler as needed of all accessible portions of each vessel. Bilateral testing is considered an integral part of a complete examination. Limited examinations for reoccurring indications may be performed as noted.   Duplex Findings: +----------------------+--------+--------+------+--------+ Mesenteric            PSV cm/sEDV cm/sPlaqueComments +----------------------+--------+--------+------+--------+ Aorta Mid               361                          +----------------------+--------+--------+------+--------+ Celiac Artery Origin    321                          +----------------------+--------+--------+------+--------+ Celiac Artery Proximal  349                          +----------------------+--------+--------+------+--------+  SMA Proximal            388      36                  +----------------------+--------+--------+------+--------+    +------------------+--------+--------+-------+ Right Renal ArteryPSV cm/sEDV cm/sComment +------------------+--------+--------+-------+ Origin              104      17           +------------------+--------+--------+-------+ Proximal            128      31           +------------------+--------+--------+-------+ Mid                 183      25           +------------------+--------+--------+-------+ Distal              186      48           +------------------+--------+--------+-------+ +-----------------+--------+--------+-------+ Left Renal ArteryPSV cm/sEDV cm/sComment +-----------------+--------+--------+-------+ Origin             223      26           +-----------------+--------+--------+-------+ Proximal            83      10           +-----------------+--------+--------+-------+ Mid                 82      13           +-----------------+--------+--------+-------+ Distal              71      13           +-----------------+--------+--------+-------+  Technologist observations: Cystic structure noted in inferior pole of right kidney measuring: 3.1 x 1.9 cm and slight dilation of renal pelvis measuring: 1.2 cm. +------------+--------+--------+----+-----------+--------+--------+----+ Right KidneyPSV  cm/sEDV cm/sRI  Left KidneyPSV cm/sEDV cm/sRI   +------------+--------+--------+----+-----------+--------+--------+----+ Upper Pole  30      10      0.65Upper Pole 29      7       0.76 +------------+--------+--------+----+-----------+--------+--------+----+ Mid         40      7       0.        23      7       0.68 +------------+--------+--------+----+-----------+--------+--------+----+ Lower Pole  29      10      0.66Lower Pole 20      7       0.63 +------------+--------+--------+----+-----------+--------+--------+----+ Hilar       40      8       0.79Hilar      27      7       0.75 +------------+--------+--------+----+-----------+--------+--------+----+ +------------------+-----+------------------+----+ Right Kidney           Left Kidney            +------------------+-----+------------------+----+ RAR                    RAR                    +------------------+-----+------------------+----+ RAR (manual)      0.1  RAR (manual)      0.08 +------------------+-----+------------------+----+ Cortex  Cortex                 +------------------+-----+------------------+----+ Cortex thickness       Corex thickness        +------------------+-----+------------------+----+ Kidney length (cm)11.20Kidney length (cm)9.73 +------------------+-----+------------------+----+  Summary: Renal:  Right: RRV flow present. Normal size right kidney. 1-59% stenosis of        the right renal artery. Abnormal right Resistive Index. Left:  LRV flow present. Normal size of left kidney. Normal left        Resistive Index. No evidence of left renal artery stenosis.  *See table(s) above for measurements and observations.  Diagnosing physician: Heath Lark  Electronically signed by Heath Lark on 04/01/2023 at 6:15:17 PM.    Final    DG Hip Unilat W or Wo Pelvis 2-3 Views Left  Result Date: 03/30/2023 CLINICAL DATA:  Left hip pain following  fall, initial encounter EXAM: DG HIP (WITH OR WITHOUT PELVIS) 3V LEFT COMPARISON:  None Available. FINDINGS: Pelvic ring is intact. Mild degenerative changes of the hip joints are noted bilaterally. No acute fracture or dislocation is noted. No soft tissue abnormality is seen. IMPRESSION: Degenerative change without acute abnormality. Electronically Signed   By: Alcide Clever M.D.   On: 03/30/2023 20:40   DG Shoulder Right  Result Date: 03/30/2023 CLINICAL DATA:  Fall 1 week ago with right shoulder pain, initial encounter EXAM: RIGHT SHOULDER - 2+ VIEW COMPARISON:  None Available. FINDINGS: Degenerative changes of the acromioclavicular and glenohumeral joints are seen. No acute fracture or dislocation is noted. No soft tissue abnormality is seen. IMPRESSION: Degenerative change without acute abnormality. Electronically Signed   By: Alcide Clever M.D.   On: 03/30/2023 20:39   CT Head Wo Contrast  Result Date: 03/30/2023 CLINICAL DATA:  Mental status change, unknown cause EXAM: CT HEAD WITHOUT CONTRAST TECHNIQUE: Contiguous axial images were obtained from the base of the skull through the vertex without intravenous contrast. RADIATION DOSE REDUCTION: This exam was performed according to the departmental dose-optimization program which includes automated exposure control, adjustment of the mA and/or kV according to patient size and/or use of iterative reconstruction technique. COMPARISON:  CT Head 12/24/19 FINDINGS: Brain: No hemorrhage. No hydrocephalus. No extra-axial fluid collection. No mass effect. No mass lesion. 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 Vascular: No hyperdense vessel or unexpected calcification. Skull: Normal. Negative for fracture or focal lesion. Sinuses/Orbits: No middle ear mastoid effusion. Paranasal sinuses are clear. Mucosal thickening bilateral maxillary sinuses. Bilateral lens replacement. Orbits are otherwise unremarkable. Other: None. IMPRESSION: No acute intracranial  abnormality. Electronically Signed   By: Lorenza Cambridge M.D.   On: 03/30/2023 20:30    Microbiology: Results for orders placed or performed during the hospital encounter of 03/30/23  SARS Coronavirus 2 by RT PCR (hospital order, performed in [facility] hospital lab) *cepheid single result test* Anterior Nasal Swab     Status: None   Collection Time: 03/31/23  3:49 AM   Specimen: Anterior Nasal Swab  Result Value Ref Range Status   SARS Coronavirus 2 by RT PCR NEGATIVE NEGATIVE Final    Comment: Performed at [facility], [address], [place], [place] [address]    Labs: CBC: Recent Labs  Lab 03/30/23 1724 03/31/23 6045 04/01/23 0634 04/02/23 0614  WBC 3.3* 5.0 4.3 3.6*  NEUTROABS 1.9 3.7  --   --   HGB 8.8* 9.8* 9.6* 8.6*  HCT 26.6* 29.5* 29.8* 26.3*  MCV 89.9 88.9 90.3 89.5  PLT  133* 153 168 137*   Basic Metabolic Panel: Recent Labs  Lab 03/30/23 1724 03/31/23 0638 04/01/23 0634 04/02/23 0614  NA 141 140 141 137  K 4.1 3.4* 3.8 4.0  CL 105 107 108 108  CO2 29 23 26 22   GLUCOSE 101* 94 94 95  BUN 42* 36* 36* 42*  CREATININE 1.61* 1.31* 1.35* 1.55*  CALCIUM 9.7 9.3 9.8 9.3   Liver Function Tests: Recent Labs  Lab 03/30/23 1724  AST 27  ALT 10  ALKPHOS 25*  BILITOT 0.4  PROT 5.8*  ALBUMIN 3.7   CBG: No results for input(s): "GLUCAP" in the last 168 hours.  Discharge time spent: greater than 30 minutes.  Signed: Thad Ranger, MD [facility] 04/02/2023

## 2023-04-02 NOTE — Progress Notes (Signed)
Carotid artery duplex has been completed. Preliminary results can be found in CV Proc through chart review.   04/02/23 12:45 PM Olen Cordial RVT

## 2023-04-02 NOTE — Plan of Care (Signed)

## 2023-04-04 ENCOUNTER — Observation Stay (HOSPITAL_COMMUNITY)
Admission: EM | Admit: 2023-04-04 | Discharge: 2023-04-10 | Disposition: A | Discharge status: Skilled Nursing Facility | Payer: Medicare PPO | Attending: Emergency Medicine | Admitting: Emergency Medicine

## 2023-04-04 ENCOUNTER — Other Ambulatory Visit: Payer: Self-pay

## 2023-04-04 ENCOUNTER — Encounter (HOSPITAL_COMMUNITY): Payer: Self-pay

## 2023-04-04 DIAGNOSIS — Z79899 Other long term (current) drug therapy: Secondary | ICD-10-CM | POA: Diagnosis not present

## 2023-04-04 DIAGNOSIS — D649 Anemia, unspecified: Secondary | ICD-10-CM | POA: Diagnosis not present

## 2023-04-04 DIAGNOSIS — R4182 Altered mental status, unspecified: Secondary | ICD-10-CM | POA: Diagnosis present

## 2023-04-04 DIAGNOSIS — E1122 Type 2 diabetes mellitus with diabetic chronic kidney disease: Secondary | ICD-10-CM | POA: Diagnosis not present

## 2023-04-04 DIAGNOSIS — I5030 Unspecified diastolic (congestive) heart failure: Secondary | ICD-10-CM | POA: Diagnosis present

## 2023-04-04 DIAGNOSIS — I160 Hypertensive urgency: Secondary | ICD-10-CM | POA: Diagnosis present

## 2023-04-04 DIAGNOSIS — I130 Hypertensive heart and chronic kidney disease with heart failure and stage 1 through stage 4 chronic kidney disease, or unspecified chronic kidney disease: Secondary | ICD-10-CM | POA: Insufficient documentation

## 2023-04-04 DIAGNOSIS — E039 Hypothyroidism, unspecified: Secondary | ICD-10-CM | POA: Diagnosis not present

## 2023-04-04 DIAGNOSIS — Z7982 Long term (current) use of aspirin: Secondary | ICD-10-CM | POA: Diagnosis not present

## 2023-04-04 DIAGNOSIS — G9340 Encephalopathy, unspecified: Secondary | ICD-10-CM | POA: Diagnosis not present

## 2023-04-04 DIAGNOSIS — R001 Bradycardia, unspecified: Secondary | ICD-10-CM | POA: Diagnosis not present

## 2023-04-04 DIAGNOSIS — R600 Localized edema: Secondary | ICD-10-CM | POA: Diagnosis present

## 2023-04-04 DIAGNOSIS — N1832 Chronic kidney disease, stage 3b: Secondary | ICD-10-CM

## 2023-04-04 DIAGNOSIS — I5032 Chronic diastolic (congestive) heart failure: Secondary | ICD-10-CM | POA: Diagnosis not present

## 2023-04-04 DIAGNOSIS — R2689 Other abnormalities of gait and mobility: Secondary | ICD-10-CM | POA: Diagnosis present

## 2023-04-04 DIAGNOSIS — M7989 Other specified soft tissue disorders: Secondary | ICD-10-CM | POA: Insufficient documentation

## 2023-04-04 DIAGNOSIS — E785 Hyperlipidemia, unspecified: Secondary | ICD-10-CM

## 2023-04-04 DIAGNOSIS — G4733 Obstructive sleep apnea (adult) (pediatric): Secondary | ICD-10-CM | POA: Diagnosis present

## 2023-04-04 LAB — URINALYSIS, ROUTINE W REFLEX MICROSCOPIC
Bacteria, UA: NONE SEEN
Bilirubin Urine: NEGATIVE
Glucose, UA: NEGATIVE mg/dL
Ketones, ur: NEGATIVE mg/dL
Leukocytes,Ua: NEGATIVE
Nitrite: NEGATIVE
Protein, ur: >=300 mg/dL — AB
Specific Gravity, Urine: 1.012 (ref 1.005–1.030)
pH: 5 (ref 5.0–8.0)

## 2023-04-04 LAB — COMPREHENSIVE METABOLIC PANEL
ALT: 19 U/L (ref 0–44)
AST: 33 U/L (ref 15–41)
Albumin: 3.2 g/dL — ABNORMAL LOW (ref 3.5–5.0)
Alkaline Phosphatase: 29 U/L — ABNORMAL LOW (ref 38–126)
Anion gap: 7 (ref 5–15)
BUN: 47 mg/dL — ABNORMAL HIGH (ref 8–23)
CO2: 23 mmol/L (ref 22–32)
Calcium: 9 mg/dL (ref 8.9–10.3)
Chloride: 110 mmol/L (ref 98–111)
Creatinine, Ser: 1.43 mg/dL — ABNORMAL HIGH (ref 0.44–1.00)
GFR, Estimated: 37 mL/min — ABNORMAL LOW (ref >60)
Glucose, Bld: 89 mg/dL (ref 70–99)
Potassium: 3.6 mmol/L (ref 3.5–5.1)
Sodium: 140 mmol/L (ref 135–145)
Total Bilirubin: 0.9 mg/dL (ref <1.2)
Total Protein: 5.6 g/dL — ABNORMAL LOW (ref 6.5–8.1)

## 2023-04-04 LAB — CBC
HCT: 29.1 % — ABNORMAL LOW (ref 36.0–46.0)
Hemoglobin: 9.4 g/dL — ABNORMAL LOW (ref 12.0–15.0)
MCH: 29.6 pg (ref 26.0–34.0)
MCHC: 32.3 g/dL (ref 30.0–36.0)
MCV: 91.5 fL (ref 80.0–100.0)
Platelets: 151 10*3/uL (ref 150–400)
RBC: 3.18 MIL/uL — ABNORMAL LOW (ref 3.87–5.11)
RDW: 14.1 % (ref 11.5–15.5)
WBC: 4.5 10*3/uL (ref 4.0–10.5)
nRBC: 0 % (ref 0.0–0.2)

## 2023-04-04 LAB — CBG MONITORING, ED: Glucose-Capillary: 114 mg/dL — ABNORMAL HIGH (ref 70–99)

## 2023-04-04 MED ORDER — BISACODYL 5 MG PO TBEC: 5.0000 mg | DELAYED_RELEASE_TABLET | Freq: Every day | ORAL | Status: DC | PRN

## 2023-04-04 MED ORDER — ONDANSETRON HCL 4 MG/2ML IJ SOLN: 4.0000 mg | Freq: Four times a day (QID) | INTRAMUSCULAR | Status: DC | PRN

## 2023-04-04 MED ORDER — ENOXAPARIN SODIUM 30 MG/0.3ML IJ SOSY
30.0000 mg | PREFILLED_SYRINGE | INTRAMUSCULAR | Status: DC
  Administered 2023-04-05 – 2023-04-10 (×6): 30 mg via SUBCUTANEOUS
  Filled 2023-04-04 (×6): qty 0.3

## 2023-04-04 MED ORDER — ORAL CARE MOUTH RINSE: 15.0000 mL | OROMUCOSAL | Status: DC | PRN

## 2023-04-04 MED ORDER — AMLODIPINE BESYLATE 5 MG PO TABS: 10.0000 mg | ORAL_TABLET | Freq: Every day | ORAL | Status: DC

## 2023-04-04 MED ORDER — ACETAMINOPHEN 325 MG PO TABS
650.0000 mg | ORAL_TABLET | Freq: Four times a day (QID) | ORAL | Status: DC | PRN
  Administered 2023-04-06 – 2023-04-07 (×2): 650 mg via ORAL
  Filled 2023-04-04: qty 2

## 2023-04-04 MED ORDER — AMLODIPINE BESYLATE 5 MG PO TABS
10.0000 mg | ORAL_TABLET | Freq: Every day | ORAL | Status: DC
  Administered 2023-04-04 – 2023-04-10 (×7): 10 mg via ORAL
  Filled 2023-04-04 (×3): qty 2
  Filled 2023-04-04: qty 4
  Filled 2023-04-04 (×4): qty 2

## 2023-04-04 MED ORDER — SENNOSIDES-DOCUSATE SODIUM 8.6-50 MG PO TABS: 1.0000 | ORAL_TABLET | Freq: Every evening | ORAL | Status: DC | PRN

## 2023-04-04 MED ORDER — ATORVASTATIN CALCIUM 40 MG PO TABS
40.0000 mg | ORAL_TABLET | Freq: Every day | ORAL | Status: DC
  Administered 2023-04-05 – 2023-04-10 (×6): 40 mg via ORAL
  Filled 2023-04-04 (×6): qty 1

## 2023-04-04 MED ORDER — ALBUTEROL SULFATE (2.5 MG/3ML) 0.083% IN NEBU
3.0000 mL | INHALATION_SOLUTION | RESPIRATORY_TRACT | Status: DC | PRN
  Administered 2023-04-07: 3 mL via RESPIRATORY_TRACT
  Filled 2023-04-04: qty 3

## 2023-04-04 MED ORDER — LEVOTHYROXINE SODIUM 25 MCG PO TABS
137.0000 ug | ORAL_TABLET | Freq: Every day | ORAL | Status: DC
  Administered 2023-04-05 – 2023-04-10 (×6): 137 ug via ORAL
  Filled 2023-04-04 (×6): qty 1

## 2023-04-04 MED ORDER — CLONIDINE HCL 0.2 MG/24HR TD PTWK
0.2000 mg | MEDICATED_PATCH | TRANSDERMAL | Status: DC
  Administered 2023-04-07: 0.2 mg via TRANSDERMAL
  Filled 2023-04-04: qty 1

## 2023-04-04 MED ORDER — LORAZEPAM 0.5 MG PO TABS: 0.5000 mg | ORAL_TABLET | Freq: Once | ORAL | Status: DC | PRN

## 2023-04-04 MED ORDER — HYDRALAZINE HCL 100 MG PO TABS: 100.0000 mg | ORAL_TABLET | Freq: Three times a day (TID) | ORAL | Status: DC

## 2023-04-04 MED ORDER — ACETAMINOPHEN 650 MG RE SUPP: 650.0000 mg | Freq: Four times a day (QID) | RECTAL | Status: DC | PRN

## 2023-04-04 MED ORDER — ISOSORBIDE MONONITRATE ER 30 MG PO TB24
30.0000 mg | ORAL_TABLET | Freq: Every day | ORAL | Status: DC
  Administered 2023-04-04 – 2023-04-08 (×5): 30 mg via ORAL
  Filled 2023-04-04 (×5): qty 1

## 2023-04-04 MED ORDER — HYDRALAZINE HCL 50 MG PO TABS
100.0000 mg | ORAL_TABLET | Freq: Three times a day (TID) | ORAL | Status: DC
  Administered 2023-04-04 – 2023-04-10 (×17): 100 mg via ORAL
  Filled 2023-04-04 (×17): qty 2

## 2023-04-04 MED ORDER — ONDANSETRON HCL 4 MG PO TABS: 4.0000 mg | ORAL_TABLET | Freq: Four times a day (QID) | ORAL | Status: DC | PRN

## 2023-04-04 NOTE — ED Notes (Signed)
ED TO INPATIENT HANDOFF REPORT  ED Nurse Name and Phone #: Steward Drone RN [phone]  S Name/Age/Gender Kimberly [name] 82 y.o. female Room/Bed: 041C/041C  Code Status   Code Status: Prior  Home/SNF/Other Home Patient oriented to: self Is this baseline? No   Triage Complete: Triage complete  Chief Complaint Acute encephalopathy [G93.40]  Triage Note Pt was d/c'd from hospital 2 days ago.  Daughter states that pt was home and started not making sense when she was talking, pt was wandering and not recognizing family.  Pt has had increased swelling in bilateral lower extremities. Pt had home health nurse come to house and recommended that pt return to hospital for evaluation. Pt c/o left shoulder pain. Pt unable to answer questions appropriately. Pt is HOH. Pt did fall last week prior to previous admission.   Allergies Allergies  Allergen Reactions   Oxycodone-Acetaminophen Shortness Of Breath, Itching and Other (See Comments)    Pt couldn't breathe or catch her breath   Percocet [Oxycodone-Acetaminophen] Shortness Of Breath and Other (See Comments)    Pt couldn't breathe or catch her breath   Codeine Itching and Other (See Comments)    hyperactivity    Other Other (See Comments)    Some pain medication given at [facility] for surgery caused hallucinations    Promethazine Hcl Nausea And Vomiting and Other (See Comments)    Hallucinations     Clonidine Derivatives Other (See Comments)    Bradycardia (06/2017)   Hydralazine Other (See Comments)    Headache/dizzy    Clonidine Palpitations    Bradycardia (06/2017)   Metoprolol Palpitations    Bradycardia 06/2017     Level of Care/Admitting Diagnosis ED Disposition     ED Disposition  Admit   Condition  --   Comment  Hospital Area: [facility] [[id number]]  Level of Care: Progressive [102]  Admit to Progressive based on following criteria: CARDIOVASCULAR & THORACIC of moderate stability with acute  coronary syndrome symptoms/low risk myocardial infarction/hypertensive urgency/arrhythmias/heart failure potentially compromising stability and stable post cardiovascular intervention patients.  May place patient in observation at [facility] or [facility] if equivalent level of care is available:: No  Covid Evaluation: Asymptomatic - no recent exposure (last 10 days) testing not required  Diagnosis: Acute encephalopathy [244010]  Admitting Physician: Charlsie Quest [[id number]]  Attending Physician: Charlsie Quest [[id number]]          B Medical/Surgery History Past Medical History:  Diagnosis Date   Acid reflux    Anxiety    Arthritis    Back spasm    Bradycardia    a. junctional and sinus brady during admission 06/2017 requiring cessation of beta blocker and clonidine.   Carotid artery stenosis    Carotid Dopplers 07/2021 showed 40 to 59% right ICA stenosis and 1 to 39% left ICA stenosis.  The right subclavian artery flow is disturbed.   Chronic diastolic heart failure (HCC) 03/20/2015   Chronic edema    CKD (chronic kidney disease), stage III (HCC)    Depression    Diabetes mellitus without complication (HCC)    diet controled   Fuch's endothelial dystrophy    bilateral eyes   Headache    occ   HOH (hard of hearing)    wears hearing aid in left ear   Hypercholesteremia    Hypertensive heart disease with CHF (HCC) 09/11/2014   Insomnia    Junctional bradycardia    Mild mitral regurgitation  Mild tricuspid regurgitation    Normal coronary arteries 2010   a. 2010 cath with no obstructive epicardial disease.  Done for CP which was felt to be due to microvascular angina or diastolic HF.   OSA on CPAP    Followed by Dr. Fannie Knee   Patient's other noncompliance with medication regimen    per notes, gets confused easily with medications, difficult compliance    Past Surgical History:  Procedure Laterality Date   ABDOMINAL HYSTERECTOMY     APPENDECTOMY     CARDIAC  CATHETERIZATION     no CAD   CESAREAN SECTION     x 2   CHOLECYSTECTOMY     COLONOSCOPY     EYE SURGERY Right    implant   ROTATOR CUFF REPAIR Left    SHOULDER ARTHROSCOPY WITH SUBACROMIAL DECOMPRESSION AND BICEP TENDON REPAIR Left 08/24/2012   Procedure: SHOULDER ARTHROSCOPY WITH SUBACROMIAL DECOMPRESSION AND BICEP TENDON REPAIR;  Surgeon: Cammy Copa, MD;  Location: [facility] OR;  Service: Orthopedics;  Laterality: Left;  Left Shoulder Arthroscopy, Debridement, Biceps Tenotomy   THYROIDECTOMY N/A 06/12/2015   Procedure: TOTAL THYROIDECTOMY;  Surgeon: Darnell Level, MD;  Location: [facility] OR;  Service: General;  Laterality: N/A;     A IV Location/Drains/Wounds Patient Lines/Drains/Airways Status     Active Line/Drains/Airways     Name Placement date Placement time Site Days   Peripheral IV 04/04/23 Anterior;Distal;Left Forearm 04/04/23  1744  Forearm  less than 1            Intake/Output Last 24 hours No intake or output data in the 24 hours ending 04/04/23 1928  Labs/Imaging Results for orders placed or performed during the hospital encounter of 04/04/23 (from the past 48 hour(s))  CBG monitoring, ED     Status: Abnormal   Collection Time: 04/04/23  3:43 PM  Result Value Ref Range   Glucose-Capillary 114 (H) 70 - 99 mg/dL    Comment: Glucose reference range applies only to samples taken after fasting for at least 8 hours.  Comprehensive metabolic panel     Status: Abnormal   Collection Time: 04/04/23  5:43 PM  Result Value Ref Range   Sodium 140 135 - 145 mmol/L   Potassium 3.6 3.5 - 5.1 mmol/L   Chloride 110 98 - 111 mmol/L   CO2 23 22 - 32 mmol/L   Glucose, Bld 89 70 - 99 mg/dL    Comment: Glucose reference range applies only to samples taken after fasting for at least 8 hours.   BUN 47 (H) 8 - 23 mg/dL   Creatinine, Ser 6.57 (H) 0.44 - 1.00 mg/dL   Calcium 9.0 8.9 - 84.6 mg/dL   Total Protein 5.6 (L) 6.5 - 8.1 g/dL   Albumin 3.2 (L) 3.5 - 5.0 g/dL   AST 33 15 - 41  U/L   ALT 19 0 - 44 U/L   Alkaline Phosphatase 29 (L) 38 - 126 U/L   Total Bilirubin 0.9 <1.2 mg/dL   GFR, Estimated 37 (L) >60 mL/min    Comment: (NOTE) Calculated using the CKD-EPI Creatinine Equation (2021)    Anion gap 7 5 - 15    Comment: Performed at [facility], [address] [place], [place] [address]  CBC     Status: Abnormal   Collection Time: 04/04/23  5:43 PM  Result Value Ref Range   WBC 4.5 4.0 - 10.5 K/uL   RBC 3.18 (L) 3.87 - 5.11 MIL/uL   Hemoglobin  9.4 (L) 12.0 - 15.0 g/dL   HCT 16.1 (L) 09.6 - 04.5 %   MCV 91.5 80.0 - 100.0 fL   MCH 29.6 26.0 - 34.0 pg   MCHC 32.3 30.0 - 36.0 g/dL   RDW 40.9 81.1 - 91.4 %   Platelets 151 150 - 400 K/uL   nRBC 0.0 0.0 - 0.2 %    Comment: Performed at [facility], [address], [place], [place] [address]  Urinalysis, Routine w reflex microscopic -Urine, Clean Catch     Status: Abnormal   Collection Time: 04/04/23  5:43 PM  Result Value Ref Range   Color, Urine YELLOW YELLOW   APPearance CLEAR CLEAR   Specific Gravity, Urine 1.012 1.005 - 1.030   pH 5.0 5.0 - 8.0   Glucose, UA NEGATIVE NEGATIVE mg/dL   Hgb urine dipstick SMALL (A) NEGATIVE   Bilirubin Urine NEGATIVE NEGATIVE   Ketones, ur NEGATIVE NEGATIVE mg/dL   Protein, ur >=621 (A) NEGATIVE mg/dL   Nitrite NEGATIVE NEGATIVE   Leukocytes,Ua NEGATIVE NEGATIVE   RBC / HPF 0-5 0 - 5 RBC/hpf   WBC, UA 0-5 0 - 5 WBC/hpf   Bacteria, UA NONE SEEN NONE SEEN   Squamous Epithelial / HPF 0-5 0 - 5 /HPF    Comment: Performed at [facility], [address] [place], [place] [address]   No results found.  Pending Labs Unresulted Labs (From admission, onward)    None       Vitals/Pain Today's Vitals   04/04/23 1459 04/04/23 1502  BP: (!) 211/63   Pulse: (!) 57   Resp: 16   Temp: 98.9 F (37.2 C)   TempSrc: Oral   SpO2: 99%   Weight:  59 kg  Height:  5' (1.524 m)    Isolation Precautions No active isolations  Medications Medications -  No data to display  Mobility walks     Focused Assessments   R Recommendations: See Admitting Provider Note  Report given to:   Additional Notes: PT family states symptoms are intermittent.  When they came into the patient's room here in the hospital, pt did not recognize them.  Shortly after, she was speaking to them by name.  Pt very hard of hearing and reads lips.  Steward Drone

## 2023-04-04 NOTE — ED Provider Notes (Signed)
EMERGENCY DEPARTMENT AT [facility] Provider Note   CSN: [id number] Arrival date & time: 04/04/23  1453     History {Add pertinent medical, surgical, social history, OB history to HPI:1} Chief Complaint  Patient presents with  . Altered Mental Status    Kimberly [name] is a 82 y.o. female.  HPI    82 year old female brought into the emergency room with chief complaint of altered mental status. Patient has history of CHF, hypertension, hypothyroidism and recently was found to have pancytopenia.  She was recently admitted to the hospital for altered mental status and hypertensive urgency.  According to the family, patient was discharged 3 days ago.  The same day she was discharged, when family called her, patient was confused.  She was telling family members that one of the grandkids are turned on the open and close the door-which was not true.  Subsequently she has been talking about family members visiting that has not occurred.  Patient often has been disoriented, not sleeping well.  Family started staying with her on Thursday night and have noticed significant changes that are new.  They also noted that patient might not have been taking her medications as prescribed, as there are many unopened packages of patient's medications.  Patient does not have any history of known dementia.  When she was admitted, there was CT finding of chronic infarct.  Admitting team did discuss possibility of dementia -but patient does not carry that diagnosis. Home Medications Prior to Admission medications   Medication Sig Start Date End Date Taking? Authorizing Provider  acetaminophen (TYLENOL) 500 MG tablet Take 1,000 mg by mouth every 6 (six) hours as needed for mild pain (pain score 1-3) or moderate pain (pain score 4-6).    [provider]  albuterol (VENTOLIN HFA) 108 (90 Base) MCG/ACT inhaler Inhale 1 puff into the lungs every 4 (four) hours as needed for wheezing or  shortness of breath.    [provider]  amLODipine (NORVASC) 10 MG tablet Take 1 tablet (10 mg total) by mouth daily. 03/17/23   Tereso Newcomer T, PA-C  aspirin 81 MG chewable tablet Chew 81 mg by mouth daily.    [provider]  atorvastatin (LIPITOR) 40 MG tablet Take 1 tablet (40 mg total) by mouth daily. 03/17/23   Tereso Newcomer T, PA-C  Cholecalciferol (VITAMIN D3) 5000 units CAPS Take 5,000 Units by mouth daily.    [provider]  cloNIDine (CATAPRES - DOSED IN MG/24 HR) 0.2 mg/24hr patch Place 1 patch (0.2 mg total) onto the skin once a week. 04/07/23   Rai, Delene Ruffini, MD  Fluticasone Propionate, Inhal, 100 MCG/ACT AEPB 1 puff Inhalation Twice a day for 90 days 02/02/23   [provider]  hydrALAZINE (APRESOLINE) 100 MG tablet Take 1 tablet (100 mg total) by mouth 3 (three) times daily. Patient taking differently: Take 100 mg by mouth daily. 01/20/23 01/20/24  Pokhrel, Rebekah Chesterfield, MD  isosorbide mononitrate (IMDUR) 30 MG 24 hr tablet Take 1 tablet (30 mg total) by mouth daily. 04/03/23   Rai, Delene Ruffini, MD  levothyroxine (SYNTHROID) 137 MCG tablet TAKE ONE TABLET BY MOUTH BEFORE BREAKFAST 09/04/22   Carlus Pavlov, MD  nitroGLYCERIN (NITROSTAT) 0.4 MG SL tablet Place 1 tablet (0.4 mg total) under the tongue every 5 (five) minutes as needed for chest pain. 07/31/21   Quintella Reichert, MD  ondansetron (ZOFRAN) 4 MG tablet Take 1 tablet (4 mg total) by mouth every 8 (eight)  hours as needed for nausea or vomiting. 04/02/23   Rai, Ripudeep K, MD  PARoxetine (PAXIL) 20 MG tablet Take 20 mg by mouth daily.    [provider]      Allergies    Oxycodone-acetaminophen, Percocet [oxycodone-acetaminophen], Codeine, Other, Promethazine hcl, Clonidine derivatives, Hydralazine, Clonidine, and Metoprolol    Review of Systems   Review of Systems  All other systems reviewed and are negative.   Physical Exam Updated Vital Signs BP (!) 211/63 (BP Location: Right  Arm)   Pulse (!) 57   Temp 98.9 F (37.2 C) (Oral)   Resp 16   Ht 5' (1.524 m)   Wt 59 kg   SpO2 99%   BMI 25.39 kg/m  Physical Exam  ED Results / Procedures / Treatments   Labs (all labs ordered are listed, but only abnormal results are displayed) Labs Reviewed  CBC - Abnormal; Notable for the following components:      Result Value   RBC 3.18 (*)    Hemoglobin 9.4 (*)    HCT 29.1 (*)    All other components within normal limits  URINALYSIS, ROUTINE W REFLEX MICROSCOPIC - Abnormal; Notable for the following components:   Hgb urine dipstick SMALL (*)    Protein, ur >=300 (*)    All other components within normal limits  CBG MONITORING, ED - Abnormal; Notable for the following components:   Glucose-Capillary 114 (*)    All other components within normal limits  COMPREHENSIVE METABOLIC PANEL    EKG EKG Interpretation Date/Time:  Saturday April 04 2023 15:02:10 EST Ventricular Rate:  59 PR Interval:    QRS Duration:  94 QT Interval:  426 QTC Calculation: 421 R Axis:   3  Text Interpretation: Junctional rhythm Septal infarct , age undetermined Possible Lateral infarct , age undetermined Inferior infarct , age undetermined Abnormal ECG When compared with ECG of 30-Mar-2023 17:23, PREVIOUS ECG IS PRESENT Confirmed by Derwood Kaplan [id number]) on 04/04/2023 6:03:59 PM  Radiology No results found.  Procedures Procedures  {Document cardiac monitor, telemetry assessment procedure when appropriate:1}  Medications Ordered in ED Medications - No data to display  ED Course/ Medical Decision Making/ A&P   {   Click here for ABCD2, HEART and other calculatorsREFRESH Note before signing :1}                              Medical Decision Making Amount and/or Complexity of Data Reviewed Labs: ordered.  Risk Decision regarding hospitalization.   ***  {Document critical care time when appropriate:1} {Document review of labs and clinical decision tools ie heart score,  Chads2Vasc2 etc:1}  {Document your independent review of radiology images, and any outside records:1} {Document your discussion with family members, caretakers, and with consultants:1} {Document social determinants of health affecting pt's care:1} {Document your decision making why or why not admission, treatments were needed:1} Final Clinical Impression(s) / ED Diagnoses Final diagnoses:  Acute encephalopathy    Rx / DC Orders ED Discharge Orders     None

## 2023-04-04 NOTE — ED Provider Triage Note (Signed)
Emergency Medicine Provider Triage Evaluation Note  Kimberly [name] , a 82 y.o. female  was evaluated in triage.  Pt complains of altered mental status.  Patient brought here by her daughter.  She was just discharged from the hospital for hypertensive emergency/encephalopathy.  Patient returns to the ER with elevated blood pressure and altered mental status, which is described this patient hallucinating and talking " out of her mind".  Review of Systems  Positive: Altered mental status Negative: Headache  Physical Exam  BP (!) 211/63 (BP Location: Right Arm)   Pulse (!) 57   Temp 98.9 F (37.2 C) (Oral)   Resp 16   Ht 5' (1.524 m)   Wt 59 kg   SpO2 99%   BMI 25.39 kg/m  Gen:   Awake, no distress   Resp:  Normal effort  MSK:   Moves extremities without difficulty   Medical Decision Making  Medically screening exam initiated at 3:29 PM.  Appropriate orders placed.  Kimberly [name] was informed that the remainder of the evaluation will be completed by another provider, this initial triage assessment does not replace that evaluation, and the importance of remaining in the ED until their evaluation is complete.  Hypertensive, basic labs have been ordered. Differential will include polypharmacy, dementia/cognitive decline, infection, metabolic. At this time I am not ordering CT brain.  She will need more comprehensive evaluation.   Derwood Kaplan, MD 04/04/23 [phone]

## 2023-04-04 NOTE — ED Notes (Signed)
Darreld Mclean MD notified of pt's elevated BP

## 2023-04-04 NOTE — ED Notes (Signed)
Pt ambulated to bathroom with standby assist.  Follows commands.

## 2023-04-04 NOTE — H&P (Signed)
History and Physical    Kimberly [name] UJW:[id number] DOB: 02/25/1941 DOA: 04/04/2023  PCP: Inez Pilgrim, NP  Patient coming from: Home  I have personally briefly reviewed patient's old medical records in [facility]  Chief Complaint: Altered mental status  HPI: Kimberly [name] is a 82 y.o. female with medical history significant for chronic HFpEF (EF 60-65%), CKD stage IIIb, HTN, HLD, hypothyroidism, OSA on CPAP, very hard of hearing who presented to the ED from home for evaluation of altered mental status.  History is limited from patient due to altered mental status and is otherwise supplemented by EDP, chart review, and family at bedside.  Patient was recently admitted 11/18-11/21 for hypertensive urgency associated with mild confusion at AKI on CKD stage IIIb.  CT head was negative for acute abnormality.  Home valsartan and chlorthalidone were held due to renal dysfunction.  Patient was started on clonidine patch (discontinued oral clonidine), continued Norvasc 10 mg daily and Imdur 30 mg daily.  Patient advised to increase hydralazine to 100 mg 3 times daily as previously prescribed.  She was taking once daily).  Renal artery duplex negative for any stenosis bilaterally.  Renal function improved from 1.61 to 1.5.  Patient was discharged to home.  Family states that since patient has been home she has been increasingly more confused and has not been making sense when speaking.  Family suspect that she has been hallucinating as she has been describing family members in the house that were not actually there.  She has had increased swelling to her legs.  Today they noted that she has been walking with a slow shuffling gait.  She normally is ambulatory without assistive device.  When family went to see her in the house she did not recognize them.  They also note that she had a unusual staring spell today.  Family does not believe that she has been taking her medications as prescribed.   They went through her medication cabinet and saw numerous bottles including old prescriptions dating back to June.  Family does state that she has some chronic memory issues but this is clearly a significant change from her baseline.  Home health nurse went to see her today and recommended she be brought back to the ED for further evaluation.  ED Course  Labs/Imaging on admission: I have personally reviewed following labs and imaging studies.  Initial vitals showed BP 211/63, pulse 57, RR 16, temp 98.9 F, SpO2 99% on room air.  Labs show WBC 4.5, hemoglobin 9.4, platelets 151,000, sodium 140, potassium 3.6, bicarb 23, BUN 47, creatinine 1.43, serum glucose 89, AST 33, ALT 19, alk phos 29, total bilirubin 0.9.  UA negative for UTI.  The hospital service was consulted to admit for further evaluation and management.  Review of Systems: All systems reviewed and are negative except as documented in history of present illness above.   Past Medical History:  Diagnosis Date   Acid reflux    Anxiety    Arthritis    Back spasm    Bradycardia    a. junctional and sinus brady during admission 06/2017 requiring cessation of beta blocker and clonidine.   Carotid artery stenosis    Carotid Dopplers 07/2021 showed 40 to 59% right ICA stenosis and 1 to 39% left ICA stenosis.  The right subclavian artery flow is disturbed.   Chronic diastolic heart failure (HCC) 03/20/2015   Chronic edema    CKD (chronic kidney disease), stage III (HCC)  Depression    Diabetes mellitus without complication (HCC)    diet controled   Fuch's endothelial dystrophy    bilateral eyes   Headache    occ   HOH (hard of hearing)    wears hearing aid in left ear   Hypercholesteremia    Hypertensive heart disease with CHF (HCC) 09/11/2014   Insomnia    Junctional bradycardia    Mild mitral regurgitation    Mild tricuspid regurgitation    Normal coronary arteries 2010   a. 2010 cath with no obstructive epicardial  disease.  Done for CP which was felt to be due to microvascular angina or diastolic HF.   OSA on CPAP    Followed by Dr. Fannie Knee   Patient's other noncompliance with medication regimen    per notes, gets confused easily with medications, difficult compliance     Past Surgical History:  Procedure Laterality Date   ABDOMINAL HYSTERECTOMY     APPENDECTOMY     CARDIAC CATHETERIZATION     no CAD   CESAREAN SECTION     x 2   CHOLECYSTECTOMY     COLONOSCOPY     EYE SURGERY Right    implant   ROTATOR CUFF REPAIR Left    SHOULDER ARTHROSCOPY WITH SUBACROMIAL DECOMPRESSION AND BICEP TENDON REPAIR Left 08/24/2012   Procedure: SHOULDER ARTHROSCOPY WITH SUBACROMIAL DECOMPRESSION AND BICEP TENDON REPAIR;  Surgeon: Cammy Copa, MD;  Location: [facility] OR;  Service: Orthopedics;  Laterality: Left;  Left Shoulder Arthroscopy, Debridement, Biceps Tenotomy   THYROIDECTOMY N/A 06/12/2015   Procedure: TOTAL THYROIDECTOMY;  Surgeon: Darnell Level, MD;  Location: [facility] OR;  Service: General;  Laterality: N/A;    Social History:  reports that she has never smoked. She has never used smokeless tobacco. She reports that she does not drink alcohol and does not use drugs.  Allergies  Allergen Reactions   Oxycodone-Acetaminophen Shortness Of Breath, Itching and Other (See Comments)    Pt couldn't breathe or catch her breath   Percocet [Oxycodone-Acetaminophen] Shortness Of Breath and Other (See Comments)    Pt couldn't breathe or catch her breath   Codeine Itching and Other (See Comments)    hyperactivity    Other Other (See Comments)    Some pain medication given at [facility] for surgery caused hallucinations    Promethazine Hcl Nausea And Vomiting and Other (See Comments)    Hallucinations     Clonidine Derivatives Other (See Comments)    Bradycardia (06/2017)   Hydralazine Other (See Comments)    Headache/dizzy    Clonidine Palpitations    Bradycardia (06/2017)   Metoprolol Palpitations     Bradycardia 06/2017     Family History  Problem Relation Age of Onset   Stomach cancer Mother    Cancer Sister        intestinal     Prior to Admission medications   Medication Sig Start Date End Date Taking? Authorizing Provider  acetaminophen (TYLENOL) 500 MG tablet Take 1,000 mg by mouth every 6 (six) hours as needed for mild pain (pain score 1-3) or moderate pain (pain score 4-6).    [provider]  albuterol (VENTOLIN HFA) 108 (90 Base) MCG/ACT inhaler Inhale 1 puff into the lungs every 4 (four) hours as needed for wheezing or shortness of breath.    [provider]  amLODipine (NORVASC) 10 MG tablet Take 1 tablet (10 mg total) by mouth daily. 03/17/23   Tereso Newcomer T, PA-C  aspirin 81  MG chewable tablet Chew 81 mg by mouth daily.    [provider]  atorvastatin (LIPITOR) 40 MG tablet Take 1 tablet (40 mg total) by mouth daily. 03/17/23   Tereso Newcomer T, PA-C  Cholecalciferol (VITAMIN D3) 5000 units CAPS Take 5,000 Units by mouth daily.    [provider]  cloNIDine (CATAPRES - DOSED IN MG/24 HR) 0.2 mg/24hr patch Place 1 patch (0.2 mg total) onto the skin once a week. 04/07/23   Rai, Delene Ruffini, MD  Fluticasone Propionate, Inhal, 100 MCG/ACT AEPB 1 puff Inhalation Twice a day for 90 days 02/02/23   [provider]  hydrALAZINE (APRESOLINE) 100 MG tablet Take 1 tablet (100 mg total) by mouth 3 (three) times daily. Patient taking differently: Take 100 mg by mouth daily. 01/20/23 01/20/24  Pokhrel, Rebekah Chesterfield, MD  isosorbide mononitrate (IMDUR) 30 MG 24 hr tablet Take 1 tablet (30 mg total) by mouth daily. 04/03/23   Rai, Delene Ruffini, MD  levothyroxine (SYNTHROID) 137 MCG tablet TAKE ONE TABLET BY MOUTH BEFORE BREAKFAST 09/04/22   Carlus Pavlov, MD  nitroGLYCERIN (NITROSTAT) 0.4 MG SL tablet Place 1 tablet (0.4 mg total) under the tongue every 5 (five) minutes as needed for chest pain. 07/31/21   Quintella Reichert, MD  ondansetron (ZOFRAN) 4 MG  tablet Take 1 tablet (4 mg total) by mouth every 8 (eight) hours as needed for nausea or vomiting. 04/02/23   Rai, Ripudeep K, MD  PARoxetine (PAXIL) 20 MG tablet Take 20 mg by mouth daily.    [provider]    Physical Exam: Vitals:   04/04/23 1459 04/04/23 1502 04/04/23 1945 04/04/23 2015  BP: (!) 211/63  (!) 232/66   Pulse: (!) 57  (!) 55   Resp: 16  (!) 25   Temp: 98.9 F (37.2 C)   98.4 F (36.9 C)  TempSrc: Oral   Oral  SpO2: 99%  98%   Weight:  59 kg    Height:  5' (1.524 m)     Constitutional: Resting in bed, NAD, calm, comfortable Eyes: EOMI, lids and conjunctivae normal ENMT: Very hard of hearing.  Mucous membranes are moist. Posterior pharynx clear of any exudate or lesions.Normal dentition.  Neck: normal, supple, no masses. Respiratory: clear to auscultation bilaterally, no wheezing, no crackles. Normal respiratory effort. No accessory muscle use.  Cardiovascular: Borderline bradycardic, no murmurs / rubs / gallops.  Nonpitting edema bilateral lower extremities. 2+ pedal pulses. Abdomen: no tenderness, no masses palpated. Musculoskeletal: no clubbing / cyanosis. No joint deformity upper and lower extremities. Good ROM, no contractures. Normal muscle tone.  Skin: no rashes, lesions, ulcers. No induration Neurologic: Sensation intact. Strength 5/5 in all 4.  Psychiatric: Alert and oriented to self and year.  EKG: Personally reviewed. Sinus versus junctional rhythm, rate 59, motion artifact, no acute ischemic changes.  Previous EKG showed sinus bradycardia, rate 45.  Assessment/Plan Principal Problem:   Acute encephalopathy Active Problems:   Obstructive sleep apnea   (HFpEF) heart failure with preserved ejection fraction (HCC)   Edema of extremities   Hypertensive urgency   Chronic kidney disease, stage 3b (HCC)   Hyperlipidemia   Kimberly [name] is a 82 y.o. female with medical history significant for chronic HFpEF (EF 60-65%), CKD stage IIIb, HTN, HLD,  hypothyroidism, OSA on CPAP, very hard of hearing who is admitted for evaluation of altered mental status.  Assessment and Plan: Acute encephalopathy: Family reports significant change in mental status since recent discharge including hallucinations, slow  shuffling gait, and a staring spell.  Unclear etiology although this may be related to uncontrolled hypertension.  UA negative for UTI.  Recent CT head showed chronic left parietal lobe infarct otherwise no acute change.  She is very hard of hearing.  Family concerned she currently is not able to reliably care for self or take her medications as prescribed. -Obtain MRI brain -Obtain EEG -Resume home antihypertensives -PT/OT eval -Encouraged use of hearing aids in hospital  Hypertensive urgency: SBP 211 on arrival.  Recent admit for hypertensive urgency and home meds were adjusted including addition of clonidine patch.  Per family, patient unlikely to be taking her medications as prescribed. -Resume amlodipine 10 mg daily, hydralazine 100 mg TID, Imdur 30 mg daily, clonidine patch 0.2 mg weekly -Renal ultrasound 04/01/23 negative for significant renal artery stenosis -Plasma metanephrine, renin, aldosterone and urine metanephrines and catecholamines collected on recent admit are still pending  Bilateral lower extremity swelling: Family states this is new over the last few days.  Will obtain bilateral venous Dopplers.  CKD stage IIIb: Renal function stable with creatinine 1.43.  Normocytic anemia: Hemoglobin stable at 9.4.  Labs on 11/19 showed Ferritin 134, iron 42, TIBC 440, folate 10.4, B12 383.  Hypothyroidism: TSH 7.391 on 04/01/23.  Continue Synthroid.  Hyperlipidemia: Continue atorvastatin.  OSA: Continue CPAP nightly.   DVT prophylaxis: enoxaparin (LOVENOX) injection 30 mg Start: 04/05/23 1000 Code Status:   Code Status: Limited: Do not attempt resuscitation (DNR) -DNR-LIMITED -Do Not Intubate/DNI Discussed with patient,  daughter, and son-in-law all at bedside at time of admission. Family Communication: Daughter and son-in-law at bedside Disposition Plan: From home, dispo pending clinical progress Consults called: None Severity of Illness: The appropriate patient status for this patient is OBSERVATION. Observation status is judged to be reasonable and necessary in order to provide the required intensity of service to ensure the patient's safety. The patient's presenting symptoms, physical exam findings, and initial radiographic and laboratory data in the context of their medical condition is felt to place them at decreased risk for further clinical deterioration. Furthermore, it is anticipated that the patient will be medically stable for discharge from the hospital within 2 midnights of admission.   Darreld Mclean MD [facility]  If 7PM-7AM, please contact night-coverage [email or web address]  04/04/2023, 8:16 PM

## 2023-04-04 NOTE — Hospital Course (Signed)
Kimberly [name] is a 82 y.o. female with medical history significant for chronic HFpEF (EF 60-65%), CKD stage IIIb, HTN, HLD, hypothyroidism, OSA on CPAP, very hard of hearing who is admitted for evaluation of altered mental status.

## 2023-04-04 NOTE — ED Triage Notes (Addendum)
Pt was d/c'd from hospital 2 days ago.  Daughter states that pt was home and started not making sense when she was talking, pt was wandering and not recognizing family.  Pt has had increased swelling in bilateral lower extremities. Pt had home health nurse come to house and recommended that pt return to hospital for evaluation. Pt c/o left shoulder pain. Pt unable to answer questions appropriately. Pt is HOH. Pt did fall last week prior to previous admission.

## 2023-04-05 ENCOUNTER — Observation Stay (HOSPITAL_COMMUNITY): Payer: Medicare PPO

## 2023-04-05 ENCOUNTER — Observation Stay (HOSPITAL_BASED_OUTPATIENT_CLINIC_OR_DEPARTMENT_OTHER): Payer: Medicare PPO

## 2023-04-05 DIAGNOSIS — G9340 Encephalopathy, unspecified: Secondary | ICD-10-CM

## 2023-04-05 DIAGNOSIS — R443 Hallucinations, unspecified: Secondary | ICD-10-CM | POA: Diagnosis not present

## 2023-04-05 DIAGNOSIS — R4182 Altered mental status, unspecified: Secondary | ICD-10-CM | POA: Diagnosis not present

## 2023-04-05 DIAGNOSIS — M7989 Other specified soft tissue disorders: Secondary | ICD-10-CM

## 2023-04-05 DIAGNOSIS — Z471 Aftercare following joint replacement surgery: Secondary | ICD-10-CM | POA: Diagnosis not present

## 2023-04-05 DIAGNOSIS — I517 Cardiomegaly: Secondary | ICD-10-CM | POA: Diagnosis not present

## 2023-04-05 DIAGNOSIS — Z8673 Personal history of transient ischemic attack (TIA), and cerebral infarction without residual deficits: Secondary | ICD-10-CM | POA: Diagnosis not present

## 2023-04-05 LAB — CBC
HCT: 24.2 % — ABNORMAL LOW (ref 36.0–46.0)
Hemoglobin: 8.1 g/dL — ABNORMAL LOW (ref 12.0–15.0)
MCH: 29.8 pg (ref 26.0–34.0)
MCHC: 33.5 g/dL (ref 30.0–36.0)
MCV: 89 fL (ref 80.0–100.0)
Platelets: 153 10*3/uL (ref 150–400)
RBC: 2.72 MIL/uL — ABNORMAL LOW (ref 3.87–5.11)
RDW: 14.1 % (ref 11.5–15.5)
WBC: 3.3 10*3/uL — ABNORMAL LOW (ref 4.0–10.5)
nRBC: 0 % (ref 0.0–0.2)

## 2023-04-05 LAB — BASIC METABOLIC PANEL
Anion gap: 6 (ref 5–15)
BUN: 41 mg/dL — ABNORMAL HIGH (ref 8–23)
CO2: 24 mmol/L (ref 22–32)
Calcium: 8.9 mg/dL (ref 8.9–10.3)
Chloride: 111 mmol/L (ref 98–111)
Creatinine, Ser: 1.28 mg/dL — ABNORMAL HIGH (ref 0.44–1.00)
GFR, Estimated: 42 mL/min — ABNORMAL LOW (ref >60)
Glucose, Bld: 99 mg/dL (ref 70–99)
Potassium: 3.5 mmol/L (ref 3.5–5.1)
Sodium: 141 mmol/L (ref 135–145)

## 2023-04-05 LAB — ALDOSTERONE: Aldosterone: 6.8 ng/dL (ref 0.0–30.0)

## 2023-04-05 MED ORDER — SALINE SPRAY 0.65 % NA SOLN
1.0000 | NASAL | Status: DC | PRN
  Administered 2023-04-07: 1 via NASAL
  Filled 2023-04-05: qty 44

## 2023-04-05 MED ORDER — LORATADINE 10 MG PO TABS
10.0000 mg | ORAL_TABLET | Freq: Every day | ORAL | Status: DC
  Administered 2023-04-05 – 2023-04-10 (×6): 10 mg via ORAL
  Filled 2023-04-05 (×6): qty 1

## 2023-04-05 NOTE — Progress Notes (Signed)
PT Cancellation Note  Patient Details Name: Kimberly [name] MRN: [id number] DOB: March 31, 1941   Cancelled Treatment:    Reason Eval/Treat Not Completed: Medical issues which prohibited therapy  Pt with bil leg edema and dopplers are pending. Will await results.   Jerolyn Center, PT Acute Rehabilitation Services  Office [phone]    Zena Amos 04/05/2023, 6:50 AM

## 2023-04-05 NOTE — Progress Notes (Signed)
PROGRESS NOTE    Kimberly [name]  EXB:[id number] DOB: 28-Feb-1941 DOA: 04/04/2023 PCP: Inez Pilgrim, NP  Chief Complaint  Patient presents with   Altered Mental Status    Brief Narrative:   Kimberly [name] is Kimberly [name] 82 y.o. female with medical history significant for chronic HFpEF (EF 60-65%), CKD stage IIIb, HTN, HLD, hypothyroidism, OSA on CPAP, very hard of hearing who presented to the ED from home for evaluation of altered mental status.   Assessment & Plan:   Principal Problem:   Acute encephalopathy Active Problems:   Obstructive sleep apnea   (HFpEF) heart failure with preserved ejection [name] (HCC)   Edema of extremities   Hypertensive urgency   Chronic kidney disease, stage 3b (HCC)   Hyperlipidemia  Acute metabolic encephalopathy Hypertensive Encephalopathy Family reports significant change in mental status since recent discharge including hallucinations, slow shuffling gait, and Kimberly [name] staring spell.  Unclear etiology although this may be related to uncontrolled hypertension.  UA without concerning findings for UTI.  MRI without acute intracranial process.  She is very hard of hearing.  Family concerned she currently is not able to reliably care for self or take her medications as prescribed.  She's improved today, but on discussion with family they note she continues to be altered and they aren't able to provide 24 hr care at home. -Obtain EEG -CXR -Resume home antihypertensives -PT/OT eval HH if family able to provide 24/7 supervision, if unable needs post acute rehab <3 hrs/day -Encouraged use of hearing aids in [name]   Hypertensive urgency: SBP 211 on arrival.  Recent admit for hypertensive urgency and home meds were adjusted including addition of clonidine patch.  Per family, patient unlikely to be taking her medications as prescribed. -Resume amlodipine 10 mg daily, hydralazine 100 mg TID, Imdur 30 mg daily, clonidine patch 0.2 mg weekly -Renal ultrasound 04/01/23  negative for significant renal artery stenosis -Plasma metanephrine, renin/aldosterone, plasma metanephrines, urine metanephrines and catecholamines collected on recent admit are still pending   Bilateral lower extremity swelling: Negative for DVT   CKD stage IIIb: Appears close to baseline   Normocytic anemia: Labs on 11/19 showed Ferritin 134, iron 42, TIBC 440, folate 10.4, B12 383. Will trend   Hypothyroidism: TSH 7.391 on 04/01/23.  Continue Synthroid.   Hyperlipidemia: Continue atorvastatin.   OSA: Continue CPAP nightly.    DVT prophylaxis: lovenox Code Status: DNR Family Communication: daughter over phone Disposition:   Status is: Observation The patient will require care spanning > 2 midnights and should be moved to inpatient because: need for safe discharge plan   Consultants:  none  Procedures:  none  Antimicrobials:  Anti-infectives (From admission, onward)    None       Subjective: No complaints Alert and oriented  Objective: Vitals:   04/04/23 2355 04/05/23 0413 04/05/23 0736 04/05/23 1111  BP: (!) 141/60 (!) 157/60 (!) 175/58 (!) 137/40  Pulse: 61 60 61 73  Resp: 18 18 20 18   Temp: 98.6 F (37 C) 98.7 F (37.1 C) 99.1 F (37.3 C) 98.7 F (37.1 C)  TempSrc: Oral Oral Oral Oral  SpO2: 97% 95% 97% 98%  Weight:      Height:       No intake or output data in the 24 hours ending 04/05/23 1356 Filed Weights   04/04/23 1502  Weight: 59 kg    Examination:  General exam: Appears calm and comfortable  Respiratory system: unlabored Cardiovascular system: RRR Central nervous system: Alert and oriented.  Hard of hearing.  Moving all extremities.   Extremities: no lee    Data Reviewed: I have personally reviewed following labs and imaging studies  CBC: Recent Labs  Lab 03/30/23 1724 03/31/23 0638 04/01/23 0634 04/02/23 0614 04/04/23 1743 04/05/23 0430  WBC 3.3* 5.0 4.3 3.6* 4.5 3.3*  NEUTROABS 1.9 3.7  --   --   --   --    HGB 8.8* 9.8* 9.6* 8.6* 9.4* 8.1*  HCT 26.6* 29.5* 29.8* 26.3* 29.1* 24.2*  MCV 89.9 88.9 90.3 89.5 91.5 89.0  PLT 133* 153 168 137* 151 153    Basic Metabolic Panel: Recent Labs  Lab 03/31/23 0638 04/01/23 0634 04/02/23 0614 04/04/23 1743 04/05/23 0430  NA 140 141 137 140 141  K 3.4* 3.8 4.0 3.6 3.5  CL 107 108 108 110 111  CO2 23 26 22 23 24   GLUCOSE 94 94 95 89 99  BUN 36* 36* 42* 47* 41*  CREATININE 1.31* 1.35* 1.55* 1.43* 1.28*  CALCIUM 9.3 9.8 9.3 9.0 8.9    GFR: Estimated Creatinine Clearance: 27.7 mL/min (Kimberly [name]) (by C-G formula based on SCr of 1.28 mg/dL (H)).  Liver Function Tests: Recent Labs  Lab 03/30/23 1724 04/04/23 1743  AST 27 33  ALT 10 19  ALKPHOS 25* 29*  BILITOT 0.4 0.9  PROT 5.8* 5.6*  ALBUMIN 3.7 3.2*    CBG: Recent Labs  Lab 04/04/23 1543  GLUCAP 114*     Recent Results (from the past 240 hour(s))  SARS Coronavirus 2 by RT PCR ([name] order, performed in [name]) *cepheid single result test* Anterior Nasal Swab     Status: None   Collection Time: 03/31/23  3:49 AM   Specimen: Anterior Nasal Swab  Result Value Ref Range Status   SARS Coronavirus 2 by RT PCR NEGATIVE NEGATIVE Final    Comment: Performed at [facility], [address], [place], [place] [address]         Radiology Studies: VAS [place] LOWER EXTREMITY VENOUS (DVT)  Result Date: 04/05/2023  Lower Venous DVT Study Patient Name:  Kimberly [name]  Date of Exam:   04/05/2023 Medical Rec #: [id number]       Accession #:    [id number] Date of Birth: 1941/01/18       Patient Gender: F Patient Age:   69 years Exam Location:  [name] Procedure:      VAS [place] LOWER EXTREMITY VENOUS (DVT) Referring Phys: Kimberly [name] --------------------------------------------------------------------------------  Indications: Swelling, and Edema.  Comparison Study: No prior exam. Performing Technologist: Kimberly [name]  Examination Guidelines: Kimberly [name] complete evaluation  includes B-mode imaging, spectral Doppler, color Doppler, and power Doppler as needed of all accessible portions of each vessel. Bilateral testing is considered an integral part of Kimberly [name] complete examination. Limited examinations for reoccurring indications may be performed as noted. The reflux portion of the exam is performed with the patient in reverse Trendelenburg.  +---------+---------------+---------+-----------+----------+--------------+ RIGHT    CompressibilityPhasicitySpontaneityPropertiesThrombus Aging +---------+---------------+---------+-----------+----------+--------------+ CFV      Full           Yes      Yes                                 +---------+---------------+---------+-----------+----------+--------------+ SFJ      Full                                                        +---------+---------------+---------+-----------+----------+--------------+  FV Prox  Full                                                        +---------+---------------+---------+-----------+----------+--------------+ FV Mid   Full                                                        +---------+---------------+---------+-----------+----------+--------------+ FV DistalFull                                                        +---------+---------------+---------+-----------+----------+--------------+ PFV      Full                                                        +---------+---------------+---------+-----------+----------+--------------+ POP      Full           Yes      Yes                                 +---------+---------------+---------+-----------+----------+--------------+ PTV      Full                                                        +---------+---------------+---------+-----------+----------+--------------+ PERO     Full                                                         +---------+---------------+---------+-----------+----------+--------------+   +---------+---------------+---------+-----------+----------+--------------+ LEFT     CompressibilityPhasicitySpontaneityPropertiesThrombus Aging +---------+---------------+---------+-----------+----------+--------------+ CFV      Full           Yes      Yes                                 +---------+---------------+---------+-----------+----------+--------------+ SFJ      Full                                                        +---------+---------------+---------+-----------+----------+--------------+ FV Prox  Full                                                        +---------+---------------+---------+-----------+----------+--------------+  FV Mid   Full                                                        +---------+---------------+---------+-----------+----------+--------------+ FV DistalFull                                                        +---------+---------------+---------+-----------+----------+--------------+ PFV      Full                                                        +---------+---------------+---------+-----------+----------+--------------+ POP      Full           Yes      Yes                                 +---------+---------------+---------+-----------+----------+--------------+ PTV      Full                                                        +---------+---------------+---------+-----------+----------+--------------+ PERO     Full                                                        +---------+---------------+---------+-----------+----------+--------------+ Cystic structure noted in left popliteal fossa measuring: 3.9 x 1.4 x 1.8 cm    Summary: BILATERAL: - No evidence of deep vein thrombosis seen in the lower extremities, bilaterally. -  LEFT: - Kimberly [name] cystic structure is found in the popliteal fossa.  *See table(s) above for  measurements and observations. Electronically signed by Kimberly [name] on 04/05/2023 at 12:35:22 PM.    Final    MR BRAIN WO CONTRAST  Result Date: 04/05/2023 CLINICAL DATA:  Altered mental status, hallucinations, confused EXAM: MRI HEAD WITHOUT CONTRAST TECHNIQUE: Multiplanar, multiecho pulse sequences of the brain and surrounding structures were obtained without intravenous contrast. COMPARISON:  08/25/2014 MRI head, correlation is also made with 03/30/2023 CT head FINDINGS: Brain: No restricted diffusion to suggest acute or subacute infarct. No acute hemorrhage, mass, mass effect, or midline shift. No hydrocephalus or extra-axial collection. Pituitary and craniocervical junction within normal limits. Hemosiderin is associated with Kimberly [name] remote infarct in the left parietal lobe. Additional punctate focus of hemosiderin in the posterior right temporal lobe, likely sequela chronic microhemorrhage. Cerebral volume is within normal limits for age. T2 hyperintense signal in the periventricular white matter, likely the sequela of mild-to-moderate chronic small vessel ischemic disease. Vascular: Normal arterial flow voids. Skull and upper cervical spine: Normal marrow signal. Sinuses/Orbits: Mucosal thickening in the ethmoid air cells. Status post bilateral lens replacements. Other: The mastoid air  cells are well aerated. IMPRESSION: No acute intracranial process. No evidence of acute or subacute infarct. Electronically Signed   By: Wiliam Ke M.D.   On: 04/05/2023 03:51        Scheduled Meds:  amLODipine  10 mg Oral Daily   atorvastatin  40 mg Oral Daily   [START ON 04/07/2023] cloNIDine  0.2 mg Transdermal Q Tue   enoxaparin (LOVENOX) injection  30 mg Subcutaneous Q24H   hydrALAZINE  100 mg Oral TID   isosorbide mononitrate  30 mg Oral Daily   levothyroxine  137 mcg Oral Q0600   loratadine  10 mg Oral Daily   Continuous Infusions:   LOS: 0 days    Time spent: over 30 min    Lacretia Nicks,  MD [name]   To contact the attending provider between 7A-7P or the covering provider during after hours 7P-7A, please log into the web site [email or web address] and access using universal [place] password for that web site. If you do not have the password, please call the [name] operator.  04/05/2023, 1:56 PM

## 2023-04-05 NOTE — Evaluation (Signed)
Physical Therapy Evaluation Patient Details Name: Kimberly [name] MRN: [id number] DOB: 1940-10-21 Today's Date: 04/05/2023  History of Present Illness  82 y.o. female brought to ED 04/04/23 for AMS, not recognizing family, ?hallucinating, and slow shuffling gait. MRI brain with no acute findings. PMH: hypertension, hyperlipidemia, hypothyroidism, chronic HFpEF, OSA on CPAP, very HOH, and CKD 3B  Clinical Impression   Pt admitted secondary to problem above with deficits below. PTA patient was living alone in a one level house with 3 steps to enter. She reports she uses a quad cane to ambulate. Reports ?2 recent falls (one when turning around; one down her steps).  Pt currently requires supervision for ambulation with cane. She is oriented x3 (not situation). Recommend HHPT if family can provide near 24/7 supervision. If unable, recommend post-acute rehab <3 hrs/day.  Anticipate patient will benefit from PT to address problems listed below.Will continue to follow acutely to maximize functional mobility independence and safety.           If plan is discharge home, recommend the following: A little help with walking and/or transfers;A little help with bathing/dressing/bathroom;Assistance with cooking/housework;Direct supervision/assist for medications management;Direct supervision/assist for financial management;Assist for transportation;Help with stairs or ramp for entrance;Supervision due to cognitive status   Can travel by private vehicle        Equipment Recommendations None recommended by PT  Recommendations for Other Services       Functional Status Assessment Patient has had a recent decline in their functional status and demonstrates the ability to make significant improvements in function in a reasonable and predictable amount of time.     Precautions / Restrictions Precautions Precautions: Fall      Mobility  Bed Mobility               General bed mobility comments: up  in chair    Transfers Overall transfer level: Needs assistance Equipment used: Rolling walker (2 wheels) Transfers: Sit to/from Stand Sit to Stand: Supervision           General transfer comment: vc for sequencing; no physical assist    Ambulation/Gait Ambulation/Gait assistance: Contact guard assist Gait Distance (Feet): 180 Feet Assistive device: Rolling walker (2 wheels), Straight cane Gait Pattern/deviations: Step-through pattern, Decreased stride length, Shuffle   Gait velocity interpretation: <1.8 ft/sec, indicate of risk for recurrent falls   General Gait Details: vc for sequencing with cane, however pt unable to maintain correct sequence; multiple times sh4 knew she was off and could not remember correct sequence  Stairs            Wheelchair Mobility     Tilt Bed    Modified Rankin (Stroke Patients Only)       Balance Overall balance assessment: Mild deficits observed, not formally tested                                           Pertinent Vitals/Pain Pain Assessment Pain Assessment: No/denies pain    Home Living Family/patient expects to be discharged to:: Private residence Living Arrangements: Alone Available Help at Discharge: Family;Neighbor;Available PRN/intermittently Type of Home: House Home Access: Stairs to enter Entrance Stairs-Rails: Lawyer of Steps: 3   Home Layout: One level Home Equipment: Rollator (4 wheels);Grab bars - toilet;Grab bars - tub/shower;Hand held shower head;Tub bench;BSC/3in1;Cane - quad      Prior Function Prior Level of Function :  Needs assist;History of Falls (last six months) (does not drive)             Mobility Comments: uses furniture sometimes to stabilize in the house and Rollator for limited community ambulation ADLs Comments: granddaughter assists with cooking and cleaning, pt does all bADLs and some iADLs     Extremity/Trunk Assessment   Upper  Extremity Assessment Upper Extremity Assessment: Defer to OT evaluation    Lower Extremity Assessment Lower Extremity Assessment: Overall WFL for tasks assessed (bil LE edema)    Cervical / Trunk Assessment Cervical / Trunk Assessment: Kyphotic  Communication   Communication Communication: Hearing impairment (has hearing aids) Cueing Techniques: Verbal cues  Cognition     Overall Cognitive Status: No family/caregiver present to determine baseline cognitive functioning                                 General Comments: when history taking, continues to report that fall brought her into the hospital; per chart, here for AMS; oriented to self, place, day of the week, not situation; following commands briskly; able to sequence brushing teeth without cues        General Comments      Exercises     Assessment/Plan    PT Assessment Patient needs continued PT services  PT Problem List Decreased mobility;Decreased balance;Decreased cognition;Decreased knowledge of use of DME       PT Treatment Interventions DME instruction;Gait training;Stair training;Functional mobility training;Therapeutic activities;Therapeutic exercise;Balance training;Cognitive remediation;Patient/family education    PT Goals (Current goals can be found in the Care Plan section)  Acute Rehab PT Goals Patient Stated Goal: go home soon PT Goal Formulation: With patient Time For Goal Achievement: 04/19/23 Potential to Achieve Goals: Good    Frequency Min 1X/week     Co-evaluation PT/OT/SLP Co-Evaluation/Treatment: Yes Reason for Co-Treatment: Necessary to address cognition/behavior during functional activity;To address functional/ADL transfers PT goals addressed during session: Balance;Mobility/safety with mobility;Proper use of DME         AM-PAC PT "6 Clicks" Mobility  Outcome Measure Help needed turning from your back to your side while in a flat bed without using bedrails?:  None Help needed moving from lying on your back to sitting on the side of a flat bed without using bedrails?: None Help needed moving to and from a bed to a chair (including a wheelchair)?: A Little Help needed standing up from a chair using your arms (e.g., wheelchair or bedside chair)?: A Little Help needed to walk in hospital room?: A Little Help needed climbing 3-5 steps with a railing? : A Little 6 Click Score: 20    End of Session Equipment Utilized During Treatment: Gait belt Activity Tolerance: Patient tolerated treatment well Patient left: in chair;with call bell/phone within reach;with chair alarm set Nurse Communication: Mobility status;Other (comment) (pt needs help with lunch) PT Visit Diagnosis: History of falling (Z91.81);Difficulty in walking, not elsewhere classified (R26.2)    Time: 1308-6578 PT Time Calculation (min) (ACUTE ONLY): 34 min   Charges:   PT Evaluation $PT Eval Low Complexity: 1 Low   PT General Charges $$ ACUTE PT VISIT: 1 Visit          Jerolyn Center, PT Acute Rehabilitation Services  Office [phone]   Zena Amos 04/05/2023, 1:00 PM

## 2023-04-05 NOTE — Evaluation (Signed)
Occupational Therapy Evaluation Patient Details Name: Kimberly [name] MRN: [id number] DOB: 12/02/1940 Today's Date: 04/05/2023   History of Present Illness 82 y.o. female brought to ED 04/04/23 for AMS, not recognizing family, ?hallucinating, and slow shuffling gait. MRI brain with no acute findings. PMH: hypertension, hyperlipidemia, hypothyroidism, chronic HFpEF, OSA on CPAP, very HOH, and CKD 3B   Clinical Impression   PTA, pt lived alone and reports she was mod I in her home for ADL; family assists with cooking and transportation. Upon eval, pt with decreased memory, problem solving, orientation, and safety. Pt requiring CGA for OOB ADL at this time. Recommending discharge home with [facility] and recommending family initially provide 24/7 supervision secondary to cognition. Will follow acutely.       If plan is discharge home, recommend the following: A little help with walking and/or transfers;A little help with bathing/dressing/bathroom;Help with stairs or ramp for entrance;Supervision due to cognitive status;Assistance with cooking/housework;Assistance with feeding;Direct supervision/assist for financial management;Direct supervision/assist for medications management;Assist for transportation    Functional Status Assessment  Patient has had a recent decline in their functional status and demonstrates the ability to make significant improvements in function in a reasonable and predictable amount of time.  Equipment Recommendations  None recommended by OT    Recommendations for Other Services Speech consult (cog)     Precautions / Restrictions Precautions Precautions: Fall      Mobility Bed Mobility               General bed mobility comments: up in chair    Transfers Overall transfer level: Needs assistance Equipment used: Rolling walker (2 wheels) Transfers: Sit to/from Stand Sit to Stand: Supervision           General transfer comment: vc for sequencing/safety;  no physical assist      Balance Overall balance assessment: Mild deficits observed, not formally tested                                         ADL either performed or assessed with clinical judgement   ADL Overall ADL's : Needs assistance/impaired Eating/Feeding: Modified independent   Grooming: Contact guard assist;Standing   Upper Body Bathing: Set up;Sitting   Lower Body Bathing: Contact guard assist;Sit to/from stand   Upper Body Dressing : Set up;Sitting   Lower Body Dressing: Contact guard assist;Sit to/from stand   Toilet Transfer: Contact guard assist;Ambulation;Rolling walker (2 wheels)           Functional mobility during ADLs: Contact guard assist;Cane       Vision Baseline Vision/History: 0 No visual deficits Ability to See in Adequate Light: 0 Adequate Patient Visual Report: No change from baseline Vision Assessment?: No apparent visual deficits     Perception         Praxis         Pertinent Vitals/Pain Pain Assessment Pain Assessment: No/denies pain     Extremity/Trunk Assessment Upper Extremity Assessment Upper Extremity Assessment: Generalized weakness   Lower Extremity Assessment Lower Extremity Assessment: Defer to PT evaluation   Cervical / Trunk Assessment Cervical / Trunk Assessment: Kyphotic   Communication Communication Communication: Hearing impairment (has hearing aids) Cueing Techniques: Verbal cues   Cognition Arousal: Alert Behavior During Therapy: WFL for tasks assessed/performed Overall Cognitive Status: No family/caregiver present to determine baseline cognitive functioning  General Comments: when history taking, continues to report that fall brought her into the hospital; per chart, here for AMS; oriented to self, place, day of the week, not situation; following commands briskly; able to sequence brushing teeth without cues, but more difficulty with  carryover and implementation of sequence of step pattern with cane use. pt had fair carry over of room number during hall mobility. Did recognize family when they entered room.     General Comments       Exercises     Shoulder Instructions      Home Living Family/patient expects to be discharged to:: Private residence Living Arrangements: Alone Available Help at Discharge: Family;Neighbor;Available PRN/intermittently Type of Home: House Home Access: Stairs to enter [employer] of Steps: 3 Entrance Stairs-Rails: Left;Right Home Layout: One level     Bathroom Shower/Tub: Tub/shower unit;Sponge bathes at baseline (sits on toilet to sponge bathe)   Bathroom Toilet: Standard Bathroom Accessibility: Yes   Home Equipment: Rollator (4 wheels);Grab bars - toilet;Grab bars - tub/shower;Hand held shower head;Tub bench;BSC/3in1;Cane - quad          Prior Functioning/Environment Prior Level of Function : Needs assist;History of Falls (last six months) (does not drive)             Mobility Comments: uses furniture sometimes to stabilize in the house and Rollator for limited community ambulation ADLs Comments: granddaughter assists with cooking and cleaning, pt does all bADLs and some iADLs        OT Problem List: Decreased strength;Decreased activity tolerance;Impaired balance (sitting and/or standing);Decreased cognition;Decreased safety awareness;Decreased knowledge of use of DME or AE      OT Treatment/Interventions: Self-care/ADL training;Therapeutic exercise;DME and/or AE instruction;Balance training;Patient/family education;Therapeutic activities;Cognitive remediation/compensation    OT Goals(Current goals can be found in the care plan section) Acute Rehab OT Goals Patient Stated Goal: go home OT Goal Formulation: With patient Time For Goal Achievement: 04/19/23 Potential to Achieve Goals: Good  OT Frequency: Min 1X/week    Co-evaluation PT/OT/SLP  Co-Evaluation/Treatment: Yes Reason for Co-Treatment: Necessary to address cognition/behavior during functional activity;To address functional/ADL transfers PT goals addressed during session: Balance;Mobility/safety with mobility;Proper use of DME OT goals addressed during session: ADL's and self-care      AM-PAC OT "6 Clicks" Daily Activity     Outcome Measure Help from another person eating meals?: None Help from another person taking care of personal grooming?: A Little Help from another person toileting, which includes using toliet, bedpan, or urinal?: A Little Help from another person bathing (including washing, rinsing, drying)?: A Little Help from another person to put on and taking off regular upper body clothing?: A Little Help from another person to put on and taking off regular lower body clothing?: A Little 6 Click Score: 19   End of Session Equipment Utilized During Treatment: Gait belt;Rolling walker (2 wheels);Other (comment) (cane) Nurse Communication: Mobility status  Activity Tolerance: Patient tolerated treatment well Patient left: with call bell/phone within reach;in chair;with chair alarm set;with family/visitor present  OT Visit Diagnosis: Unsteadiness on feet (R26.81);Muscle weakness (generalized) (M62.81);Other symptoms and signs involving cognitive function;History of falling (Z91.81)                Time: 1610-9604 OT Time Calculation (min): 36 min Charges:  OT General Charges $OT Visit: 1 Visit OT Evaluation $OT Eval Low Complexity: 1 Low  Kimberly [name], Kimberly [name] [facility] Acute Rehabilitation Office: [phone]   Kimberly [name] 04/05/2023, 1:10 PM

## 2023-04-05 NOTE — Progress Notes (Signed)
Bilateral lower extremity venous duplex have been completed.  Results can be found in chart review under CV Proc.  04/05/2023 11:34 AM  Birtie Fellman Durenda Age, RVT.

## 2023-04-05 NOTE — Progress Notes (Signed)
OT Cancellation Note  Patient Details Name: Kimberly [name] MRN: [id number] DOB: 1940-11-08   Cancelled Treatment:    Reason Eval/Treat Not Completed: Medical issues which prohibited therapy (awaiting doppler to rule out DVT. Will follow up afterward as schedule allows.)  Tyler Deis, OTR/L [facility] Acute Rehabilitation Office: [phone]   Myrla Halsted 04/05/2023, 6:51 AM

## 2023-04-05 NOTE — Progress Notes (Signed)
EEG complete - results pending 

## 2023-04-05 NOTE — Progress Notes (Signed)
   04/05/23 1900  BiPAP/CPAP/SIPAP  BiPAP/CPAP/SIPAP Pt Type Adult  Reason BIPAP/CPAP not in use  (pt stated she doesnt tolerate the CPAP and wants to use the [place] instead)

## 2023-04-05 NOTE — Procedures (Signed)
Routine EEG Report  Kimberly [name] is a 82 y.o. female with a history of altered mental status who is undergoing an EEG to evaluate for seizures.  Report: This EEG was acquired with electrodes placed according to the International 10-20 electrode system (including Fp1, Fp2, F3, F4, C3, C4, P3, P4, O1, O2, T3, T4, T5, T6, A1, A2, Fz, Cz, Pz). The following electrodes were missing or displaced: none.  The occipital dominant rhythm was 6 Hz. This activity is reactive to stimulation. Drowsiness was manifested by background fragmentation; deeper stages of sleep were identified by K complexes and sleep spindles. There was no focal slowing. There were no interictal epileptiform discharges. There were no electrographic seizures identified. There was no abnormal response to photic stimulation or hyperventilation.   Impression and clinical correlation: This EEG was obtained while awake and asleep and is abnormal due to mild diffuse slowing indicative of global cerebral dysfunction. Epileptiform abnormalities were not seen during this recording.  Bing Neighbors, MD [facility] [phone]  If 7pm- 7am, please page neurology on call as listed in AMION.

## 2023-04-06 DIAGNOSIS — G9340 Encephalopathy, unspecified: Secondary | ICD-10-CM | POA: Diagnosis not present

## 2023-04-06 DIAGNOSIS — N1832 Chronic kidney disease, stage 3b: Secondary | ICD-10-CM | POA: Diagnosis not present

## 2023-04-06 DIAGNOSIS — I5032 Chronic diastolic (congestive) heart failure: Secondary | ICD-10-CM | POA: Diagnosis not present

## 2023-04-06 DIAGNOSIS — E1122 Type 2 diabetes mellitus with diabetic chronic kidney disease: Secondary | ICD-10-CM | POA: Diagnosis not present

## 2023-04-06 DIAGNOSIS — I160 Hypertensive urgency: Secondary | ICD-10-CM | POA: Diagnosis not present

## 2023-04-06 DIAGNOSIS — Z79899 Other long term (current) drug therapy: Secondary | ICD-10-CM | POA: Diagnosis not present

## 2023-04-06 DIAGNOSIS — E039 Hypothyroidism, unspecified: Secondary | ICD-10-CM | POA: Diagnosis not present

## 2023-04-06 DIAGNOSIS — Z7982 Long term (current) use of aspirin: Secondary | ICD-10-CM | POA: Diagnosis not present

## 2023-04-06 DIAGNOSIS — I130 Hypertensive heart and chronic kidney disease with heart failure and stage 1 through stage 4 chronic kidney disease, or unspecified chronic kidney disease: Secondary | ICD-10-CM | POA: Diagnosis not present

## 2023-04-06 LAB — CBC
HCT: 23.8 % — ABNORMAL LOW (ref 36.0–46.0)
Hemoglobin: 7.9 g/dL — ABNORMAL LOW (ref 12.0–15.0)
MCH: 29.8 pg (ref 26.0–34.0)
MCHC: 33.2 g/dL (ref 30.0–36.0)
MCV: 89.8 fL (ref 80.0–100.0)
Platelets: 149 10*3/uL — ABNORMAL LOW (ref 150–400)
RBC: 2.65 MIL/uL — ABNORMAL LOW (ref 3.87–5.11)
RDW: 13.7 % (ref 11.5–15.5)
WBC: 3.1 10*3/uL — ABNORMAL LOW (ref 4.0–10.5)
nRBC: 0 % (ref 0.0–0.2)

## 2023-04-06 LAB — ALDOSTERONE + RENIN ACTIVITY W/ RATIO
ALDO / PRA Ratio: 8.4 (ref 0.0–30.0)
Aldosterone: 7 ng/dL (ref 0.0–30.0)
PRA LC/MS/MS: 0.835 ng/mL/h (ref 0.167–5.380)

## 2023-04-06 LAB — BASIC METABOLIC PANEL
Anion gap: 8 (ref 5–15)
BUN: 46 mg/dL — ABNORMAL HIGH (ref 8–23)
CO2: 23 mmol/L (ref 22–32)
Calcium: 8.8 mg/dL — ABNORMAL LOW (ref 8.9–10.3)
Chloride: 108 mmol/L (ref 98–111)
Creatinine, Ser: 1.63 mg/dL — ABNORMAL HIGH (ref 0.44–1.00)
GFR, Estimated: 31 mL/min — ABNORMAL LOW (ref >60)
Glucose, Bld: 99 mg/dL (ref 70–99)
Potassium: 3.5 mmol/L (ref 3.5–5.1)
Sodium: 139 mmol/L (ref 135–145)

## 2023-04-06 LAB — PHOSPHORUS: Phosphorus: 3.4 mg/dL (ref 2.5–4.6)

## 2023-04-06 LAB — MAGNESIUM: Magnesium: 1.7 mg/dL (ref 1.7–2.4)

## 2023-04-06 NOTE — Progress Notes (Signed)
Physical Therapy Treatment Patient Details Name: Kimberly [name] MRN: [id number] DOB: 05/18/1940 Today's Date: 04/06/2023   History of Present Illness 82 y.o. female brought to ED 04/04/23 for AMS, not recognizing family, ?hallucinating, and slow shuffling gait. MRI brain with no acute findings. PMH: hypertension, hyperlipidemia, hypothyroidism, chronic HFpEF, OSA on CPAP, very HOH, and CKD 3B    PT Comments  Patient seen for balance and gait training. Patient continues to need education on proper use of RW (pushes too far ahead, sequencing with transfer, when turning). Performed Berg Balance Assessment and scored 35/56 indicating pt is at high risk for falling. Noted family cannot provide 24/7 supervision/assist and discharge plan updated.     If plan is discharge home, recommend the following: A little help with walking and/or transfers;A little help with bathing/dressing/bathroom;Assistance with cooking/housework;Direct supervision/assist for medications management;Direct supervision/assist for financial management;Assist for transportation;Help with stairs or ramp for entrance;Supervision due to cognitive status   Can travel by private vehicle     Yes  Equipment Recommendations  None recommended by PT    Recommendations for Other Services       Precautions / Restrictions Precautions Precautions: Fall Precaution Comments: fell down her steps immediately prior to hospitalization Restrictions Weight Bearing Restrictions: No     Mobility  Bed Mobility               General bed mobility comments: up in chair    Transfers Overall transfer level: Needs assistance Equipment used: Rolling walker (2 wheels), None Transfers: Sit to/from Stand Sit to Stand: Supervision           General transfer comment: vc for sequencing/safe use of RW    Ambulation/Gait Ambulation/Gait assistance: Min assist Gait Distance (Feet): 120 Feet Assistive device: Rolling walker (2 wheels),  None Gait Pattern/deviations: Step-through pattern, Decreased stride length, Shuffle, Drifts right/left, Narrow base of support Gait velocity: slowed Gait velocity interpretation: 1.31 - 2.62 ft/sec, indicative of limited community ambulator   General Gait Details: initially with RW with vc for proximity to RW and cues for turning (especially 180 degree turns); attempted without RW as pt does not normally use with pt drifting to her rt/lt and states "I'm a little wobbly"   Stairs             Wheelchair Mobility     Tilt Bed    Modified Rankin (Stroke Patients Only)       Balance                                 Standardized Balance Assessment Standardized Balance Assessment : Berg Balance Test Berg Balance Test Sit to Stand: Able to stand without using hands and stabilize independently Standing Unsupported: Able to stand safely 2 minutes Sitting with Back Unsupported but Feet Supported on Floor or Stool: Able to sit safely and securely 2 minutes Stand to Sit: Controls descent by using hands Transfers: Able to transfer with verbal cueing and /or supervision Standing Unsupported with Eyes Closed: Able to stand 10 seconds with supervision Standing Ubsupported with Feet Together: Able to place feet together independently and stand for 1 minute with supervision From Standing, Reach Forward with Outstretched Arm: Can reach forward >5 cm safely (2") From Standing Position, Pick up Object from Floor: Able to pick up shoe, needs supervision From Standing Position, Turn to Look Behind Over each Shoulder: Looks behind one side only/other side shows less weight shift Turn  360 Degrees: Able to turn 360 degrees safely but slowly Standing Unsupported, Alternately Place Feet on Step/Stool: Able to complete >2 steps/needs minimal assist Standing Unsupported, One Foot in Front: Needs help to step but can hold 15 seconds Standing on One Leg: Unable to try or needs assist to  prevent fall Total Score: 35        Cognition Arousal: Alert Behavior During Therapy: WFL for tasks assessed/performed Overall Cognitive Status: No family/caregiver present to determine baseline cognitive functioning                                 General Comments: remembered PT from yesterday!        Exercises Other Exercises Other Exercises: sit to stand from chairt x 5 reps (no RW)    General Comments        Pertinent Vitals/Pain Pain Assessment Pain Assessment: No/denies pain    Home Living                          Prior Function            PT Goals (current goals can now be found in the care plan section) Acute Rehab PT Goals Patient Stated Goal: go home soon Time For Goal Achievement: 04/19/23 Potential to Achieve Goals: Good Progress towards PT goals: Progressing toward goals    Frequency    Min 1X/week      PT Plan      Co-evaluation              AM-PAC PT "6 Clicks" Mobility   Outcome Measure  Help needed turning from your back to your side while in a flat bed without using bedrails?: None Help needed moving from lying on your back to sitting on the side of a flat bed without using bedrails?: A Little Help needed moving to and from a bed to a chair (including a wheelchair)?: A Little Help needed standing up from a chair using your arms (e.g., wheelchair or bedside chair)?: A Little Help needed to walk in hospital room?: A Little Help needed climbing 3-5 steps with a railing? : A Little 6 Click Score: 19    End of Session Equipment Utilized During Treatment: Gait belt Activity Tolerance: Patient tolerated treatment well Patient left: in chair;with call bell/phone within reach;with chair alarm set   PT Visit Diagnosis: History of falling (Z91.81);Difficulty in walking, not elsewhere classified (R26.2)     Time: 9562-1308 PT Time Calculation (min) (ACUTE ONLY): 14 min  Charges:    $Therapeutic Activity:  8-22 mins PT General Charges $$ ACUTE PT VISIT: 1 Visit                      Jerolyn Center, PT Acute Rehabilitation Services  Office [phone]    Zena Amos 04/06/2023, 3:14 PM

## 2023-04-06 NOTE — [place] FL2 (Signed)
[place] MEDICAID FL2 LEVEL OF CARE FORM     IDENTIFICATION  Patient Name: Kimberly [name] Birthdate: 1940-06-04 Sex: female Admission Date (Current Location): 04/04/2023  [facility] and [place] Number:  Producer, television/film/video and Address:  The . [facility], [address] [place], [place] [address]      Provider Number: [id number]  Attending Physician Name and Address:  Zigmund Daniel., *  Relative Name and Phone Number:       Current Level of Care: Hospital Recommended Level of Care: Skilled Nursing Facility Prior Approval Number:    Date Approved/Denied:   PASRR Number: [id number]  Discharge Plan: SNF    Current Diagnoses: Patient Active Problem List   Diagnosis Date Noted   Acute encephalopathy 04/04/2023   Chronic kidney disease, stage 3b (HCC) 04/04/2023   Hyperlipidemia 04/04/2023   Bilateral carotid artery stenosis 03/17/2023   Hypertensive urgency 01/18/2023   Pancytopenia (HCC) 01/18/2023   AMS (altered mental status) 05/10/2019   Acute renal failure superimposed on stage 3b chronic kidney disease (HCC) 05/10/2019   Accelerated hypertension 05/10/2019   Sinus bradycardia 06/14/2017   Junctional bradycardia 06/12/2017   Essential hypertension 10/30/2015   Edema of extremities 09/25/2015   Dietary noncompliance 09/25/2015   S/P thyroidectomy 09/18/2015   Postsurgical hypothyroidism 09/18/2015   Vitamin D insufficiency 09/18/2015   (HFpEF) heart failure with preserved ejection fraction (HCC) 03/20/2015   Elevated troponin 09/11/2014   Hypertensive heart disease 09/11/2014   Rhinitis sicca 07/02/2014   Hypercalcemia 12/09/2013   Obstructive sleep apnea 08/29/2013   Acid reflux     Orientation RESPIRATION BLADDER Height & Weight     Self, Time, Situation, Place  Normal Continent Weight: 59 kg Height:  5' (152.4 cm)  BEHAVIORAL SYMPTOMS/MOOD NEUROLOGICAL BOWEL NUTRITION STATUS      Continent Diet (renal with thin liquids)   AMBULATORY STATUS COMMUNICATION OF NEEDS Skin   Limited Assist Verbally Bruising (rt arm and hip)                       Personal Care Assistance Level of Assistance  Bathing, Dressing, Feeding Bathing Assistance: Limited assistance Feeding assistance: Independent Dressing Assistance: Limited assistance     Functional Limitations Info  Sight, Hearing, Speech Sight Info: Impaired Hearing Info: Impaired Speech Info: Adequate    SPECIAL CARE FACTORS FREQUENCY  PT (By licensed PT), OT (By licensed OT)     PT Frequency: 5x/wk OT Frequency: 5x/wk            Contractures Contractures Info: Not present    Additional Factors Info  Code Status, Allergies, Psychotropic Code Status Info: Full Allergies Info: Oxycodone-acetaminophen  Percocet (Oxycodone-acetaminophen)  Codeine  Other  Promethazine Hcl  Clonidine Derivatives  Hydralazine  Clonidine  Metoprolol           Current Medications (04/06/2023):  This is the current hospital active medication list Current Facility-Administered Medications  Medication Dose Route Frequency Provider Last Rate Last Admin   acetaminophen (TYLENOL) tablet 650 mg  650 mg Oral Q6H PRN Charlsie Quest, MD       Or   acetaminophen (TYLENOL) suppository 650 mg  650 mg Rectal Q6H PRN Darreld Mclean R, MD       albuterol (PROVENTIL) (2.5 MG/3ML) 0.083% nebulizer solution 3 mL  3 mL Inhalation Q4H PRN Darreld Mclean R, MD       amLODipine (NORVASC) tablet 10 mg  10 mg Oral Daily Patel, Vishal R,  MD   10 mg at 04/06/23 0906   atorvastatin (LIPITOR) tablet 40 mg  40 mg Oral Daily Darreld Mclean R, MD   40 mg at 04/06/23 3329   bisacodyl (DULCOLAX) EC tablet 5 mg  5 mg Oral Daily PRN Charlsie Quest, MD       [START ON 04/07/2023] cloNIDine (CATAPRES - Dosed in mg/24 hr) patch 0.2 mg  0.2 mg Transdermal Q Tue Patel, Vishal R, MD       enoxaparin (LOVENOX) injection 30 mg  30 mg Subcutaneous Q24H Darreld Mclean R, MD   30 mg at 04/06/23 5188    hydrALAZINE (APRESOLINE) tablet 100 mg  100 mg Oral TID Charlsie Quest, MD   100 mg at 04/06/23 0906   isosorbide mononitrate (IMDUR) 24 hr tablet 30 mg  30 mg Oral Daily Darreld Mclean R, MD   30 mg at 04/06/23 0906   levothyroxine (SYNTHROID) tablet 137 mcg  137 mcg Oral Q0600 Charlsie Quest, MD   137 mcg at 04/06/23 0603   loratadine (CLARITIN) tablet 10 mg  10 mg Oral Daily Darreld Mclean R, MD   10 mg at 04/06/23 0906   LORazepam (ATIVAN) tablet 0.5 mg  0.5 mg Oral Once PRN Charlsie Quest, MD       ondansetron [facility]) tablet 4 mg  4 mg Oral Q6H PRN Charlsie Quest, MD       Or   ondansetron (ZOFRAN) injection 4 mg  4 mg Intravenous Q6H PRN Charlsie Quest, MD       Oral care mouth rinse  15 mL Mouth Rinse PRN Charlsie Quest, MD       senna-docusate (Senokot-S) tablet 1 tablet  1 tablet Oral QHS PRN Charlsie Quest, MD       sodium chloride (OCEAN) 0.65 % nasal spray 1 spray  1 spray Each Nare PRN Charlsie Quest, MD         Discharge Medications: Please see discharge summary for a list of discharge medications.  Relevant Imaging Results:  Relevant Lab Results:   Additional Information SS#: [id number]  Kermit Balo, RN

## 2023-04-06 NOTE — TOC Initial Note (Signed)
Transition of Care [facility]) - Initial/Assessment Note    Patient Details  Name: Kimberly [name] MRN: [id number] Date of Birth: [birth year]  Transition of Care [facility]) CM/SW Contact:    Kermit Balo, RN Phone Number: 04/06/2023, 2:32 PM  Clinical Narrative:                  Pt is from home alone. Recommendations are for supervision at home due to cognition and balance. CM met with the patient and called the patients daughter, [name] with pt permission. [name] states the family can not provide the needed supervision. They are asking for rehab. CM has faxed her out in the [facility] area. Will provide bed offers when available. Family ultimately want ALF and are working on this. TOC following.  Expected Discharge Plan: Skilled Nursing Facility Barriers to Discharge: Continued Medical Work up   Patient Goals and CMS Choice   CMS Medicare.gov Compare Post Acute Care list provided to:: Patient Represenative (must comment) Choice offered to / list presented to : Adult Children      Expected Discharge Plan and Services   Discharge Planning Services: CM Consult Post Acute Care Choice: Skilled Nursing Facility Living arrangements for the past 2 months: Single Family Home                           HH Arranged: PT, OT          Prior Living Arrangements/Services Living arrangements for the past 2 months: Single Family Home Lives with:: Self Patient language and need for interpreter reviewed:: Yes Do you feel safe going back to the place where you live?: Yes          Current home services: DME (cane/ walker/ grab bars) Criminal Activity/Legal Involvement Pertinent to Current Situation/Hospitalization: No - Comment as needed  Activities of Daily Living   ADL Screening (condition at time of admission) Independently performs ADLs?: Yes (appropriate for developmental age) Is the patient deaf or have difficulty hearing?: Yes Does the patient have difficulty seeing, even when  wearing glasses/contacts?: No Does the patient have difficulty concentrating, remembering, or making decisions?: Yes  Permission Sought/Granted                  Emotional Assessment Appearance:: Appears stated age Attitude/Demeanor/Rapport: Engaged [facility]) Affect (typically observed): Accepting Orientation: : Oriented to Self, Oriented to Place, Oriented to  Time, Oriented to Situation   Psych Involvement: No (comment)  Admission diagnosis:  Acute encephalopathy [G93.40] Patient Active Problem List   Diagnosis Date Noted   Acute encephalopathy 04/04/2023   Chronic kidney disease, stage 3b (HCC) 04/04/2023   Hyperlipidemia 04/04/2023   Bilateral carotid artery stenosis 03/17/2023   Hypertensive urgency 01/18/2023   Pancytopenia (HCC) 01/18/2023   AMS (altered mental status) 05/10/2019   Acute renal failure superimposed on stage 3b chronic kidney disease (HCC) 05/10/2019   Accelerated hypertension 05/10/2019   Sinus bradycardia 06/14/2017   Junctional bradycardia 06/12/2017   Essential hypertension 10/30/2015   Edema of extremities 09/25/2015   Dietary noncompliance 09/25/2015   S/P thyroidectomy 09/18/2015   Postsurgical hypothyroidism 09/18/2015   Vitamin D insufficiency 09/18/2015   (HFpEF) heart failure with preserved ejection fraction (HCC) 03/20/2015   Elevated troponin 09/11/2014   Hypertensive heart disease 09/11/2014   Rhinitis sicca 07/02/2014   Hypercalcemia 12/09/2013   Obstructive sleep apnea 08/29/2013   Acid reflux    PCP:  Inez Pilgrim, NP Pharmacy:   [facility] -  [place], [place] - [address] [place] [address] Phone: [phone] Fax: [phone]  [facility], [place] - [address] [facility] [place] [address] Phone: [phone] Fax: [phone]  [facility] - [place], [place] - [address] [place]  [place] [address] Phone: [phone] Fax: [phone]     Social Determinants of Health (SDOH) Social History: SDOH Screenings   Food Insecurity: No Food Insecurity (04/04/2023)  Housing: Patient Declined (04/04/2023)  Transportation Needs: No Transportation Needs (04/04/2023)  Utilities: Not At Risk (04/04/2023)  Tobacco Use: Low Risk  (04/04/2023)   SDOH Interventions:     Readmission Risk Interventions    04/01/2023   12:22 PM  Readmission Risk Prevention Plan  Transportation Screening Complete  PCP or Specialist Appt within 5-7 Days Complete  Home Care Screening Complete  Medication Review (RN CM) Complete

## 2023-04-06 NOTE — Plan of Care (Signed)
Problem: Health Behavior/Discharge Planning: Goal: Ability to manage health-related needs will improve Outcome: Progressing   Problem: Clinical Measurements: Goal: Ability to maintain clinical measurements within normal limits will improve Outcome: Progressing   Problem: Clinical Measurements: Goal: Will remain free from infection Outcome: Progressing

## 2023-04-06 NOTE — Care Management Obs Status (Signed)
MEDICARE OBSERVATION STATUS NOTIFICATION   Patient Details  Name: CZARINA STANDEFER MRN: [id number] Date of Birth: [birth year]   Medicare Observation Status Notification Given:  Yes    Kermit Balo, RN 04/06/2023, 2:25 PM

## 2023-04-06 NOTE — Progress Notes (Signed)
Patient does not want to use hospital provided CPAP machine. She states that her granddaughter is going to bring her machine from home tomorrow.

## 2023-04-06 NOTE — Progress Notes (Signed)
PROGRESS NOTE    Kimberly [name]  WUJ:[id number] DOB: [birth year] DOA: 04/04/2023 PCP: Inez Pilgrim, NP  Chief Complaint  Patient presents with   Altered Mental Status    Brief Narrative:   Kimberly [name] is Kimberly [name] 82 y.o. female with medical history significant for chronic HFpEF (EF 60-65%), CKD stage IIIb, HTN, HLD, hypothyroidism, OSA on CPAP, very hard of hearing who presented to the ED from home for evaluation of altered mental status.   Assessment & Plan:   Principal Problem:   Acute encephalopathy Active Problems:   Obstructive sleep apnea   (HFpEF) heart failure with preserved ejection fraction (HCC)   Edema of extremities   Hypertensive urgency   Chronic kidney disease, stage 3b (HCC)   Hyperlipidemia  Acute metabolic encephalopathy Hypertensive Encephalopathy Family reports significant change in mental status since recent discharge including hallucinations, slow shuffling gait, and Kimberly [name] staring spell.  Unclear etiology although this may be related to uncontrolled hypertension.  UA without concerning findings for UTI.  MRI without acute intracranial process.  She is very hard of hearing.  Family concerned she currently is not able to reliably care for self or take her medications as prescribed.  She's improved today, but on discussion with family they note she continues to be altered and they aren't able to provide 24 hr care at home. -EEG with mild diffuse slowing indicative of global cerebral dysfunction - no epileptiform abnormalities during this recording -CXR without acute cardiopulm process -Resume home antihypertensives -PT/OT eval HH if family able to provide 24/7 supervision, if unable needs post acute rehab <3 hrs/day -> family not able to provide 24/7 care, will ask TOC to assist with SNF placement. -delirium precautions -Encouraged use of hearing aids in hospital   Hypertensive urgency: SBP 211 on arrival.  Recent admit for hypertensive urgency and home meds were  adjusted including addition of clonidine patch.  Per family, patient unlikely to be taking her medications as prescribed. -Resume amlodipine 10 mg daily, hydralazine 100 mg TID, Imdur 30 mg daily, clonidine patch 0.2 mg weekly -Renal ultrasound 04/01/23 negative for significant renal artery stenosis -Plasma metanephrine, renin/aldosterone, plasma metanephrines, urine metanephrines and catecholamines collected on recent admit are still pending   Bilateral lower extremity swelling: Negative for DVT   CKD stage IIIb: Appears close to baseline   Normocytic anemia: Labs on 11/19 showed Ferritin 134, iron 42, TIBC 440, folate 10.4, B12 383. Will trend   Hypothyroidism: TSH 7.391 on 04/01/23.  Continue Synthroid.   Hyperlipidemia: Continue atorvastatin.   OSA: Continue CPAP nightly.    DVT prophylaxis: lovenox Code Status: DNR Family Communication: daughter over phone 11/24 Disposition:   Status is: Observation The patient will require care spanning > 2 midnights and should be moved to inpatient because: need for safe discharge plan   Consultants:  none  Procedures:  none  Antimicrobials:  Anti-infectives (From admission, onward)    None       Subjective: No new complaints  Objective: Vitals:   04/05/23 2341 04/06/23 0403 04/06/23 0725 04/06/23 1157  BP: (!) 151/50 (!) 175/54 (!) 155/48 (!) 127/46  Pulse: 67 66 64 62  Resp: 17 16 14 16   Temp: 99.5 F (37.5 C) 98.9 F (37.2 C) 98.3 F (36.8 C) 98 F (36.7 C)  TempSrc: Oral Oral Oral Oral  SpO2: 93% 98% 100% 99%  Weight:      Height:        Intake/Output Summary (Last 24 hours) at 04/06/2023 1330 Last  data filed at 04/06/2023 0745 Gross per 24 hour  Intake 240 ml  Output 350 ml  Net -110 ml   Filed Weights   04/04/23 1502  Weight: 59 kg    Examination:  General: No acute distress. Cardiovascular: RRR Lungs: unlabored Neurological: Alert and oriented 3.  Very hard of hearing.  Moves all  extremities 4. Cranial nerves II through XII grossly intact. Extremities: No clubbing or cyanosis. No edema.   Data Reviewed: I have personally reviewed following labs and imaging studies  CBC: Recent Labs  Lab 03/30/23 1724 03/31/23 0638 04/01/23 0634 04/02/23 0614 04/04/23 1743 04/05/23 0430 04/06/23 0551  WBC 3.3* 5.0 4.3 3.6* 4.5 3.3* 3.1*  NEUTROABS 1.9 3.7  --   --   --   --   --   HGB 8.8* 9.8* 9.6* 8.6* 9.4* 8.1* 7.9*  HCT 26.6* 29.5* 29.8* 26.3* 29.1* 24.2* 23.8*  MCV 89.9 88.9 90.3 89.5 91.5 89.0 89.8  PLT 133* 153 168 137* 151 153 149*    Basic Metabolic Panel: Recent Labs  Lab 04/01/23 0634 04/02/23 0614 04/04/23 1743 04/05/23 0430 04/06/23 0551  NA 141 137 140 141 139  K 3.8 4.0 3.6 3.5 3.5  CL 108 108 110 111 108  CO2 26 22 23 24 23   GLUCOSE 94 95 89 99 99  BUN 36* 42* 47* 41* 46*  CREATININE 1.35* 1.55* 1.43* 1.28* 1.63*  CALCIUM 9.8 9.3 9.0 8.9 8.8*  MG  --   --   --   --  1.7  PHOS  --   --   --   --  3.4    GFR: Estimated Creatinine Clearance: 21.8 mL/min (Kimberly [name]) (by C-G formula based on SCr of 1.63 mg/dL (H)).  Liver Function Tests: Recent Labs  Lab 03/30/23 1724 04/04/23 1743  AST 27 33  ALT 10 19  ALKPHOS 25* 29*  BILITOT 0.4 0.9  PROT 5.8* 5.6*  ALBUMIN 3.7 3.2*    CBG: Recent Labs  Lab 04/04/23 1543  GLUCAP 114*     Recent Results (from the past 240 hour(s))  SARS Coronavirus 2 by RT PCR (hospital order, performed in [facility] hospital lab) *cepheid single result test* Anterior Nasal Swab     Status: None   Collection Time: 03/31/23  3:49 AM   Specimen: Anterior Nasal Swab  Result Value Ref Range Status   SARS Coronavirus 2 by RT PCR NEGATIVE NEGATIVE Final    Comment: Performed at [facility], [address], [place], [place] [address]         Radiology Studies: DG CHEST PORT 1 VIEW  Result Date: 04/05/2023 CLINICAL DATA:  Altered mental status EXAM: PORTABLE CHEST 1 VIEW COMPARISON:  Chest x-ray  01/18/2023 FINDINGS: The heart is enlarged. The lungs are clear. There is no pleural effusion or pneumothorax. No acute fractures are seen. IMPRESSION: Cardiomegaly. No acute cardiopulmonary process. Electronically Signed   By: Darliss Cheney M.D.   On: 04/05/2023 18:10   EEG adult  Result Date: 04/05/2023 Jefferson Fuel, MD     04/05/2023  5:21 PM Routine EEG Report Kimberly [name] is Kimberly [name] 82 y.o. female with Kimberly [name] history of altered mental status who is undergoing an EEG to evaluate for seizures. Report: This EEG was acquired with electrodes placed according to the International 10-20 electrode system (including Fp1, Fp2, F3, F4, C3, C4, P3, P4, O1, O2, T3, T4, T5, T6, A1, A2, Fz, Cz, Pz). The following electrodes were missing or  displaced: none. The occipital dominant rhythm was 6 Hz. This activity is reactive to stimulation. Drowsiness was manifested by background fragmentation; deeper stages of sleep were identified by K complexes and sleep spindles. There was no focal slowing. There were no interictal epileptiform discharges. There were no electrographic seizures identified. There was no abnormal response to photic stimulation or hyperventilation. Impression and clinical correlation: This EEG was obtained while awake and asleep and is abnormal due to mild diffuse slowing indicative of global cerebral dysfunction. Epileptiform abnormalities were not seen during this recording. Bing Neighbors, MD [facility] [phone] If 7pm- 7am, please page neurology on call as listed in AMION.   VAS [place] LOWER EXTREMITY VENOUS (DVT)  Result Date: 04/05/2023  Lower Venous DVT Study Patient Name:  Kimberly [name]  Date of Exam:   04/05/2023 Medical Rec #: [id number]       Accession #:    [id number] Date of Birth: 01-May-1941       Patient Gender: F Patient Age:   68 years Exam Location:  [facility] Procedure:      VAS [place] LOWER EXTREMITY VENOUS (DVT) Referring Phys: Kimberly [name]  --------------------------------------------------------------------------------  Indications: Swelling, and Edema.  Comparison Study: No prior exam. Performing Technologist: Fernande Bras  Examination Guidelines: Mikhaila Roh complete evaluation includes B-mode imaging, spectral Doppler, color Doppler, and power Doppler as needed of all accessible portions of each vessel. Bilateral testing is considered an integral part of Kimberly [name] complete examination. Limited examinations for reoccurring indications may be performed as noted. The reflux portion of the exam is performed with the patient in reverse Trendelenburg.  +---------+---------------+---------+-----------+----------+--------------+ RIGHT    CompressibilityPhasicitySpontaneityPropertiesThrombus Aging +---------+---------------+---------+-----------+----------+--------------+ CFV      Full           Yes      Yes                                 +---------+---------------+---------+-----------+----------+--------------+ SFJ      Full                                                        +---------+---------------+---------+-----------+----------+--------------+ FV Prox  Full                                                        +---------+---------------+---------+-----------+----------+--------------+ FV Mid   Full                                                        +---------+---------------+---------+-----------+----------+--------------+ FV DistalFull                                                        +---------+---------------+---------+-----------+----------+--------------+ PFV      Full                                                        +---------+---------------+---------+-----------+----------+--------------+  POP      Full           Yes      Yes                                 +---------+---------------+---------+-----------+----------+--------------+ PTV      Full                                                         +---------+---------------+---------+-----------+----------+--------------+ PERO     Full                                                        +---------+---------------+---------+-----------+----------+--------------+   +---------+---------------+---------+-----------+----------+--------------+ LEFT     CompressibilityPhasicitySpontaneityPropertiesThrombus Aging +---------+---------------+---------+-----------+----------+--------------+ CFV      Full           Yes      Yes                                 +---------+---------------+---------+-----------+----------+--------------+ SFJ      Full                                                        +---------+---------------+---------+-----------+----------+--------------+ FV Prox  Full                                                        +---------+---------------+---------+-----------+----------+--------------+ FV Mid   Full                                                        +---------+---------------+---------+-----------+----------+--------------+ FV DistalFull                                                        +---------+---------------+---------+-----------+----------+--------------+ PFV      Full                                                        +---------+---------------+---------+-----------+----------+--------------+ POP      Full           Yes      Yes                                 +---------+---------------+---------+-----------+----------+--------------+  PTV      Full                                                        +---------+---------------+---------+-----------+----------+--------------+ PERO     Full                                                        +---------+---------------+---------+-----------+----------+--------------+ Cystic structure noted in left popliteal fossa measuring: 3.9 x 1.4 x 1.8 cm    Summary:  BILATERAL: - No evidence of deep vein thrombosis seen in the lower extremities, bilaterally. -  LEFT: - Kimberly [name] cystic structure is found in the popliteal fossa.  *See table(s) above for measurements and observations. Electronically signed by Gerarda Fraction on 04/05/2023 at 12:35:22 PM.    Final    MR BRAIN WO CONTRAST  Result Date: 04/05/2023 CLINICAL DATA:  Altered mental status, hallucinations, confused EXAM: MRI HEAD WITHOUT CONTRAST TECHNIQUE: Multiplanar, multiecho pulse sequences of the brain and surrounding structures were obtained without intravenous contrast. COMPARISON:  08/25/2014 MRI head, correlation is also made with 03/30/2023 CT head FINDINGS: Brain: No restricted diffusion to suggest acute or subacute infarct. No acute hemorrhage, mass, mass effect, or midline shift. No hydrocephalus or extra-axial collection. Pituitary and craniocervical junction within normal limits. Hemosiderin is associated with Kimberly [name] remote infarct in the left parietal lobe. Additional punctate focus of hemosiderin in the posterior right temporal lobe, likely sequela chronic microhemorrhage. Cerebral volume is within normal limits for age. T2 hyperintense signal in the periventricular white matter, likely the sequela of mild-to-moderate chronic small vessel ischemic disease. Vascular: Normal arterial flow voids. Skull and upper cervical spine: Normal marrow signal. Sinuses/Orbits: Mucosal thickening in the ethmoid air cells. Status post bilateral lens replacements. Other: The mastoid air cells are well aerated. IMPRESSION: No acute intracranial process. No evidence of acute or subacute infarct. Electronically Signed   By: Wiliam Ke M.D.   On: 04/05/2023 03:51        Scheduled Meds:  amLODipine  10 mg Oral Daily   atorvastatin  40 mg Oral Daily   [START ON 04/07/2023] cloNIDine  0.2 mg Transdermal Q Tue   enoxaparin (LOVENOX) injection  30 mg Subcutaneous Q24H   hydrALAZINE  100 mg Oral TID   isosorbide mononitrate   30 mg Oral Daily   levothyroxine  137 mcg Oral Q0600   loratadine  10 mg Oral Daily   Continuous Infusions:   LOS: 0 days    Time spent: over 30 min    Lacretia Nicks, MD [facility]   To contact the attending provider between 7A-7P or the covering provider during after hours 7P-7A, please log into the web site [email or web address] and access using universal [place] password for that web site. If you do not have the password, please call the hospital operator.  04/06/2023, 1:30 PM

## 2023-04-07 DIAGNOSIS — G9340 Encephalopathy, unspecified: Secondary | ICD-10-CM | POA: Diagnosis not present

## 2023-04-07 LAB — BASIC METABOLIC PANEL
Anion gap: 10 (ref 5–15)
BUN: 41 mg/dL — ABNORMAL HIGH (ref 8–23)
CO2: 22 mmol/L (ref 22–32)
Calcium: 8.9 mg/dL (ref 8.9–10.3)
Chloride: 108 mmol/L (ref 98–111)
Creatinine, Ser: 1.61 mg/dL — ABNORMAL HIGH (ref 0.44–1.00)
GFR, Estimated: 32 mL/min — ABNORMAL LOW (ref >60)
Glucose, Bld: 91 mg/dL (ref 70–99)
Potassium: 3.6 mmol/L (ref 3.5–5.1)
Sodium: 140 mmol/L (ref 135–145)

## 2023-04-07 LAB — CBC
HCT: 23.8 % — ABNORMAL LOW (ref 36.0–46.0)
Hemoglobin: 7.8 g/dL — ABNORMAL LOW (ref 12.0–15.0)
MCH: 29.5 pg (ref 26.0–34.0)
MCHC: 32.8 g/dL (ref 30.0–36.0)
MCV: 90.2 fL (ref 80.0–100.0)
Platelets: 154 10*3/uL (ref 150–400)
RBC: 2.64 MIL/uL — ABNORMAL LOW (ref 3.87–5.11)
RDW: 13.9 % (ref 11.5–15.5)
WBC: 3 10*3/uL — ABNORMAL LOW (ref 4.0–10.5)
nRBC: 0 % (ref 0.0–0.2)

## 2023-04-07 LAB — MAGNESIUM: Magnesium: 1.7 mg/dL (ref 1.7–2.4)

## 2023-04-07 LAB — CATECHOLAMINES,UR.,FREE,24 HR
Dopamine, Rand Ur: 91 ug/L
Dopamine, Ur, 24Hr: 73 ug/(24.h) (ref 0–510)
Epinephrine, Rand Ur: <3 ug/L
Epinephrine, U, 24Hr: <2 ug/(24.h) (ref 0–20)
Norepinephrine, Rand Ur: 35 ug/L
Norepinephrine,U,24H: 28 ug/(24.h) (ref 0–135)

## 2023-04-07 LAB — PHOSPHORUS: Phosphorus: 3.1 mg/dL (ref 2.5–4.6)

## 2023-04-07 MED ORDER — FLUTICASONE PROPIONATE 50 MCG/ACT NA SUSP
2.0000 | Freq: Every day | NASAL | Status: DC
  Administered 2023-04-07 – 2023-04-10 (×4): 2 via NASAL
  Filled 2023-04-07: qty 16

## 2023-04-07 NOTE — TOC Progression Note (Signed)
Transition of Care [facility]) - Progression Note    Patient Details  Name: Kimberly [name] MRN: [id number] Date of Birth: April 25, 1941  [facility] CM/SW Contact  Kermit Balo, RN Phone Number: 04/07/2023, 1:52 PM  Clinical Narrative:     Bed offers for SNF rehab provided to patients daughter and she chose [facility]. [facility] will have a bed. CM has asked TOC MOA to begin insurance auth for rehab. Daughter updated.  TOC following.  Expected Discharge Plan: Skilled Nursing Facility Barriers to Discharge: Continued Medical Work up  Expected Discharge Plan and Services   Discharge Planning Services: CM Consult Post Acute Care Choice: Skilled Nursing Facility Living arrangements for the past 2 months: Single Family Home                           HH Arranged: PT, OT           Social Determinants of Health (SDOH) Interventions SDOH Screenings   Food Insecurity: No Food Insecurity (04/04/2023)  Housing: Patient Declined (04/04/2023)  Transportation Needs: No Transportation Needs (04/04/2023)  Utilities: Not At Risk (04/04/2023)  Tobacco Use: Low Risk  (04/04/2023)    Readmission Risk Interventions    04/01/2023   12:22 PM  Readmission Risk Prevention Plan  Transportation Screening Complete  PCP or Specialist Appt within 5-7 Days Complete  Home Care Screening Complete  Medication Review (RN CM) Complete

## 2023-04-07 NOTE — Plan of Care (Signed)

## 2023-04-07 NOTE — Progress Notes (Signed)
Occupational Therapy Treatment Patient Details Name: Kimberly [name] MRN: [id number] DOB: April 11, 1941 Today's Date: 04/07/2023   History of present illness 82 y.o. female brought to ED 04/04/23 for AMS, not recognizing family, ?hallucinating, and slow shuffling gait. MRI brain with no acute findings. PMH: hypertension, hyperlipidemia, hypothyroidism, chronic HFpEF, OSA on CPAP, very HOH, and CKD 3B   OT comments  Pt progressing toward established OT goals. Challenging memory, problem solving, and awareness with 3 step commands during ADL. Pt with greater awareness of cognitive decline this session. Pt needing min indirect cues for 3 step commands in minimally distracting environment and mod-max in mod distracting environment. Will continue to follow. Per PT note yesterday, pt family unable to provide supervision recommended at home, thus, updated discharge recommendation to inpatient rehab <3 hours/day.       If plan is discharge home, recommend the following:  A little help with walking and/or transfers;A little help with bathing/dressing/bathroom;Help with stairs or ramp for entrance;Supervision due to cognitive status;Assistance with cooking/housework;Assistance with feeding;Direct supervision/assist for financial management;Direct supervision/assist for medications management;Assist for transportation   Equipment Recommendations  None recommended by OT    Recommendations for Other Services      Precautions / Restrictions Precautions Precautions: Fall       Mobility Bed Mobility               General bed mobility comments: up in chair    Transfers Overall transfer level: Needs assistance Equipment used: Rolling walker (2 wheels), None Transfers: Sit to/from Stand Sit to Stand: Supervision           General transfer comment: VC for hand placememt     Balance Overall balance assessment: Mild deficits observed, not formally tested                                          ADL either performed or assessed with clinical judgement   ADL Overall ADL's : Needs assistance/impaired     Grooming: Supervision/safety;Standing;Wash/dry hands;Wash/dry face;Oral care Grooming Details (indicate cue type and reason): indirect cues for 3 step command             Lower Body Dressing: Contact guard assist;Sit to/from stand                      Extremity/Trunk Assessment Upper Extremity Assessment Upper Extremity Assessment: Generalized weakness   Lower Extremity Assessment Lower Extremity Assessment: Defer to PT evaluation        Vision   Vision Assessment?: No apparent visual deficits   Perception     Praxis      Cognition Arousal: Alert Behavior During Therapy: WFL for tasks assessed/performed Overall Cognitive Status: Impaired/Different from baseline Area of Impairment: Memory, Awareness, Problem solving, Safety/judgement                     Memory: Decreased short-term memory   Safety/Judgement: Decreased awareness of safety, Decreased awareness of deficits Awareness: Emergent Problem Solving: Slow processing, Requires verbal cues, Difficulty sequencing General Comments: min indirect cues to follow 3 step commands in her room. Max cues to follow 3 step command in hallway with mod distraction. Pt with improved awareness of difficulty with memory and generalized confusion        Exercises      Shoulder Instructions       General Comments  Pertinent Vitals/ Pain       Pain Assessment Pain Assessment: No/denies pain  Home Living                                          Prior Functioning/Environment              Frequency  Min 1X/week        Progress Toward Goals  OT Goals(current goals can now be found in the care plan section)  Progress towards OT goals: Progressing toward goals  Acute Rehab OT Goals Patient Stated Goal: go home OT Goal Formulation: With  patient Time For Goal Achievement: 04/19/23 Potential to Achieve Goals: Good  Plan      Co-evaluation                 AM-PAC OT "6 Clicks" Daily Activity     Outcome Measure   Help from another person eating meals?: None Help from another person taking care of personal grooming?: A Little Help from another person toileting, which includes using toliet, bedpan, or urinal?: A Little Help from another person bathing (including washing, rinsing, drying)?: A Little Help from another person to put on and taking off regular upper body clothing?: A Little Help from another person to put on and taking off regular lower body clothing?: A Little 6 Click Score: 19    End of Session Equipment Utilized During Treatment: Gait belt;Rolling walker (2 wheels)  OT Visit Diagnosis: Unsteadiness on feet (R26.81);Muscle weakness (generalized) (M62.81);Other symptoms and signs involving cognitive function;History of falling (Z91.81)   Activity Tolerance Patient tolerated treatment well   Patient Left with call bell/phone within reach;in chair;with chair alarm set;with family/visitor present   Nurse Communication Mobility status        Time: 1312-1340 OT Time Calculation (min): 28 min  Charges: OT General Charges $OT Visit: 1 Visit OT Treatments $Self Care/Home Management : 8-22 mins $Cognitive Funtion inital: Initial 15 mins  Tyler Deis, OTR/L [facility] Acute Rehabilitation Office: [phone]   Myrla Halsted 04/07/2023, 3:23 PM

## 2023-04-07 NOTE — Progress Notes (Signed)
PROGRESS NOTE    Kimberly [name]  ZOX:[id number] DOB: May 06, 1941 DOA: 04/04/2023 PCP: Inez Pilgrim, NP  Chief Complaint  Patient presents with   Altered Mental Status    Brief Narrative:   Kimberly [name] is Kimberly [name] 82 y.o. female with medical history significant for chronic HFpEF (EF 60-65%), CKD stage IIIb, HTN, HLD, hypothyroidism, OSA on CPAP, very hard of hearing who presented to the ED from home for evaluation of altered mental status.  She's improved at this time.  Presumed related to hypertensive encephalopathy.  Discharge pending to SNF.   Assessment & Plan:   Principal Problem:   Acute encephalopathy Active Problems:   Obstructive sleep apnea   (HFpEF) heart failure with preserved ejection fraction (HCC)   Edema of extremities   Hypertensive urgency   Chronic kidney disease, stage 3b (HCC)   Hyperlipidemia  Acute metabolic encephalopathy Hypertensive Encephalopathy Family reports significant change in mental status since recent discharge including hallucinations, slow shuffling gait, and Adleigh Mcmasters staring spell.  Unclear etiology although this may be related to uncontrolled hypertension.  UA without concerning findings for UTI.  MRI without acute intracranial process.  She is very hard of hearing.  Family concerned she currently is not able to reliably care for self or take her medications as prescribed.  She's improved today, but on discussion with family they note she continues to be altered and they aren't able to provide 24 hr care at home. -EEG with mild diffuse slowing indicative of global cerebral dysfunction - no epileptiform abnormalities during this recording -CXR without acute cardiopulm process -Resume home antihypertensives -PT/OT eval HH if family able to provide 24/7 supervision, if unable needs post acute rehab <3 hrs/day -> family not able to provide 24/7 care, will ask TOC to assist with SNF placement. -delirium precautions -Encouraged use of hearing aids in  hospital   Hypertensive urgency: SBP 211 on arrival.  Recent admit for hypertensive urgency and home meds were adjusted including addition of clonidine patch.  Per family, patient unlikely to be taking her medications as prescribed. -Resume amlodipine 10 mg daily, hydralazine 100 mg TID, Imdur 30 mg daily, clonidine patch 0.2 mg weekly -Renal ultrasound 04/01/23 negative for significant renal artery stenosis -Plasma metanephrine, renin/aldosterone - wnl, urine metanephrines and catecholamines collected on recent admit are still pending   Bilateral lower extremity swelling: Negative for DVT   Nasal Congestion Flonase, nasal saline   CKD stage IIIb: Appears close to baseline   Normocytic anemia: Labs on 11/19 showed Ferritin 134, iron 42, TIBC 440, folate 10.4, B12 383. Will trend   Hypothyroidism: TSH 7.391 on 11/19.  Continue Synthroid. Repeat thyroid labs in 6 weeks   Hyperlipidemia: Continue atorvastatin.   OSA: Continue CPAP nightly.    DVT prophylaxis: lovenox Code Status: DNR Family Communication: daughter over phone 11/24 Disposition:   Status is: Observation The patient will require care spanning > 2 midnights and should be moved to inpatient because: need for safe discharge plan   Consultants:  none  Procedures:  none  Antimicrobials:  Anti-infectives (From admission, onward)    None       Subjective: C/o nasal congestion   Objective: Vitals:   04/07/23 0500 04/07/23 0850 04/07/23 1150 04/07/23 1521  BP:  (!) 196/51 (!) 126/42 (!) 136/43  Pulse:  61 61 61  Resp:  16 18 16   Temp:  98.4 F (36.9 C) 98.2 F (36.8 C) 99 F (37.2 C)  TempSrc:  Axillary Oral Oral  SpO2:  98% 99% 98%  Weight: 58.8 kg     Height:        Intake/Output Summary (Last 24 hours) at 04/07/2023 1539 Last data filed at 04/07/2023 1200 Gross per 24 hour  Intake 480 ml  Output --  Net 480 ml   Filed Weights   04/04/23 1502 04/07/23 0500  Weight: 59 kg 58.8 kg     Examination:  General: No acute distress. Lungs: unlabored Neurological: Alert and oriented 3. Moves all extremities 4 with equal strength. Cranial nerves II through XII grossly intact. Extremities: No clubbing or cyanosis. No edema.  Data Reviewed: I have personally reviewed following labs and imaging studies  CBC: Recent Labs  Lab 04/02/23 0614 04/04/23 1743 23-Apr-2023 0430 04/06/23 0551 04/07/23 0436  WBC 3.6* 4.5 3.3* 3.1* 3.0*  HGB 8.6* 9.4* 8.1* 7.9* 7.8*  HCT 26.3* 29.1* 24.2* 23.8* 23.8*  MCV 89.5 91.5 89.0 89.8 90.2  PLT 137* 151 153 149* 154    Basic Metabolic Panel: Recent Labs  Lab 04/02/23 0614 04/04/23 1743 2023-04-23 0430 04/06/23 0551 04/07/23 0436  NA 137 140 141 139 140  K 4.0 3.6 3.5 3.5 3.6  CL 108 110 111 108 108  CO2 22 23 24 23 22   GLUCOSE 95 89 99 99 91  BUN 42* 47* 41* 46* 41*  CREATININE 1.55* 1.43* 1.28* 1.63* 1.61*  CALCIUM 9.3 9.0 8.9 8.8* 8.9  MG  --   --   --  1.7 1.7  PHOS  --   --   --  3.4 3.1    GFR: Estimated Creatinine Clearance: 22 mL/min (Kamarrion Stfort) (by C-G formula based on SCr of 1.61 mg/dL (H)).  Liver Function Tests: Recent Labs  Lab 04/04/23 1743  AST 33  ALT 19  ALKPHOS 29*  BILITOT 0.9  PROT 5.6*  ALBUMIN 3.2*    CBG: Recent Labs  Lab 04/04/23 1543  GLUCAP 114*     Recent Results (from the past 240 hour(s))  SARS Coronavirus 2 by RT PCR (hospital order, performed in [facility] hospital lab) *cepheid single result test* Anterior Nasal Swab     Status: None   Collection Time: 03/31/23  3:49 AM   Specimen: Anterior Nasal Swab  Result Value Ref Range Status   SARS Coronavirus 2 by RT PCR NEGATIVE NEGATIVE Final    Comment: Performed at [facility], [address], [place], [place] [address]         Radiology Studies: EEG adult  Result Date: 04-23-2023 Jefferson Fuel, MD     Apr 23, 2023  5:21 PM Routine EEG Report ROSELL CLOUSER is Kimberly [name] 82 y.o. female with Ferlin Fairhurst history of altered mental status  who is undergoing an EEG to evaluate for seizures. Report: This EEG was acquired with electrodes placed according to the International 10-20 electrode system (including Fp1, Fp2, F3, F4, C3, C4, P3, P4, O1, O2, T3, T4, T5, T6, A1, A2, Fz, Cz, Pz). The following electrodes were missing or displaced: none. The occipital dominant rhythm was 6 Hz. This activity is reactive to stimulation. Drowsiness was manifested by background fragmentation; deeper stages of sleep were identified by K complexes and sleep spindles. There was no focal slowing. There were no interictal epileptiform discharges. There were no electrographic seizures identified. There was no abnormal response to photic stimulation or hyperventilation. Impression and clinical correlation: This EEG was obtained while awake and asleep and is abnormal due to mild diffuse slowing indicative of global cerebral dysfunction. Epileptiform abnormalities were  not seen during this recording. Bing Neighbors, MD [facility] [phone] If 7pm- 7am, please page neurology on call as listed in AMION.        Scheduled Meds:  amLODipine  10 mg Oral Daily   atorvastatin  40 mg Oral Daily   cloNIDine  0.2 mg Transdermal Q Tue   enoxaparin (LOVENOX) injection  30 mg Subcutaneous Q24H   hydrALAZINE  100 mg Oral TID   isosorbide mononitrate  30 mg Oral Daily   levothyroxine  137 mcg Oral Q0600   loratadine  10 mg Oral Daily   Continuous Infusions:   LOS: 0 days    Time spent: over 30 min    Lacretia Nicks, MD [facility]   To contact the attending provider between 7A-7P or the covering provider during after hours 7P-7A, please log into the web site [email or web address] and access using universal [place] password for that web site. If you do not have the password, please call the hospital operator.  04/07/2023, 3:39 PM

## 2023-04-08 ENCOUNTER — Encounter (HOSPITAL_COMMUNITY): Payer: Medicare PPO

## 2023-04-08 DIAGNOSIS — G9340 Encephalopathy, unspecified: Secondary | ICD-10-CM | POA: Diagnosis not present

## 2023-04-08 LAB — METANEPHRINES, URINE, 24 HOUR
Metaneph Total, Ur: 68 ug/L
Metanephrines, 24H Ur: 54 ug/(24.h) (ref 36–209)
Normetanephrine, 24H Ur: 345 ug/(24.h) (ref 131–612)
Normetanephrine, Ur: 431 ug/L

## 2023-04-08 LAB — BASIC METABOLIC PANEL
Anion gap: 8 (ref 5–15)
BUN: 50 mg/dL — ABNORMAL HIGH (ref 8–23)
CO2: 23 mmol/L (ref 22–32)
Calcium: 8.8 mg/dL — ABNORMAL LOW (ref 8.9–10.3)
Chloride: 107 mmol/L (ref 98–111)
Creatinine, Ser: 1.81 mg/dL — ABNORMAL HIGH (ref 0.44–1.00)
GFR, Estimated: 28 mL/min — ABNORMAL LOW (ref >60)
Glucose, Bld: 94 mg/dL (ref 70–99)
Potassium: 4 mmol/L (ref 3.5–5.1)
Sodium: 138 mmol/L (ref 135–145)

## 2023-04-08 LAB — CBC
HCT: 24.2 % — ABNORMAL LOW (ref 36.0–46.0)
Hemoglobin: 7.9 g/dL — ABNORMAL LOW (ref 12.0–15.0)
MCH: 29.2 pg (ref 26.0–34.0)
MCHC: 32.6 g/dL (ref 30.0–36.0)
MCV: 89.3 fL (ref 80.0–100.0)
Platelets: 170 10*3/uL (ref 150–400)
RBC: 2.71 MIL/uL — ABNORMAL LOW (ref 3.87–5.11)
RDW: 13.6 % (ref 11.5–15.5)
WBC: 3.2 10*3/uL — ABNORMAL LOW (ref 4.0–10.5)
nRBC: 0 % (ref 0.0–0.2)

## 2023-04-08 LAB — METANEPHRINES, PLASMA
Metanephrine, Free: <25 pg/mL (ref 0.0–88.0)
Normetanephrine, Free: 149.5 pg/mL (ref 0.0–297.2)

## 2023-04-08 LAB — MAGNESIUM: Magnesium: 1.8 mg/dL (ref 1.7–2.4)

## 2023-04-08 LAB — PHOSPHORUS: Phosphorus: 3.3 mg/dL (ref 2.5–4.6)

## 2023-04-08 MED ORDER — HYDROCORTISONE 1 % EX CREA
1.0000 | TOPICAL_CREAM | Freq: Three times a day (TID) | CUTANEOUS | Status: DC | PRN
  Administered 2023-04-08: 1 via TOPICAL
  Filled 2023-04-08: qty 28

## 2023-04-08 NOTE — Progress Notes (Signed)
PROGRESS NOTE    Kimberly [name]  NGE:[id number] DOB: 04/05/1941 DOA: 04/04/2023 PCP: Inez Pilgrim, NP    Brief Narrative:  82 year old female with history of chronic diastolic heart failure, stage IIIb CKD, hypertension hyperlipidemia hypothyroidism, sleep apnea on CPAP, hard of hearing presented to the emergency room from home with altered mental status. Recently admitted 11/18-11/21 for hypertensive urgency associated with mild confusion and AKI.  Home dose of valsartan and chlorthalidone were held due to renal dysfunction.  Restarted on clonidine patch.  Patient was discharged to home.  Renal duplexes were negative for restenosis.  Since patient being home she has been increasingly confused, family suspected patient having hallucinations, swelling of her legs.  Walking with slow shuffling gait.  So they brought her to the ER.  She might not have been taking medicine as prescribed.  In the emergency room hemodynamically stable.  Creatinine 1.43.  Blood pressure 211/63.  Admitted due to hypertensive emergency.  Subjective: Patient seen and examined.  Denies any complaints.  Alert awake.  Hard of hearing.  She tells me that she is waiting to go to rehab. Assessment & Plan:   Acute metabolic encephalopathy with underlying cognitive dysfunction Hypertensive emergency with encephalopathy: Likely related to uncontrolled blood pressures. No evidence of infection, UA was bland.  Chest x-ray with no evidence of pneumonia.  MRI without acute intracranial process.  EEG without acute findings. Clinically improving. Home hypertensives resumed including Amlodipine 10 mg daily Isosorbide mononitrate 30 mg daily Clonidine 0.2 mg patch per week Hydralazine 100 mg 3 times daily Blood pressures are slightly elevated, however given likely noncompliance it will be too aggressive to drop her blood pressure.  Will keep on current doses of antihypertensives.  Renal ultrasound, negative for renal artery  stenosis Plasma metanephrine, catecholamines within normal limits.  Bilateral leg swelling: Negative for DVT.  Elevate and compression stockings.  CKD stage IIIb: Levels fluctuate but are stable.  Will need close monitoring.  Hypothyroidism: On Synthroid.  Continue.  TSH 7.3.  Hyperlipidemia: On a statin.  Continue.  Sleep apnea, on CPAP at night.    DVT prophylaxis: enoxaparin (LOVENOX) injection 30 mg Start: 04/05/23 1000   Code Status: DNR with limited intervention Family Communication: None at bedside Disposition Plan: Status is: Observation The patient will require care spanning > 2 midnights and should be moved to inpatient because: Awaiting safe disposition plan     Consultants:  None  Procedures:  None  Antimicrobials:  None     Objective: Vitals:   04/07/23 2331 04/08/23 0403 04/08/23 0500 04/08/23 0735  BP: (!) 159/45 (!) 160/48  (!) 178/51  Pulse: (!) 50 (!) 54  (!) 54  Resp:  17  18  Temp: 97.8 F (36.6 C) 98.7 F (37.1 C)  98.4 F (36.9 C)  TempSrc: Oral Oral  Oral  SpO2: 99% 98%  99%  Weight:   58.7 kg   Height:        Intake/Output Summary (Last 24 hours) at 04/08/2023 1133 Last data filed at 04/07/2023 1200 Gross per 24 hour  Intake 240 ml  Output --  Net 240 ml   Filed Weights   04/04/23 1502 04/07/23 0500 04/08/23 0500  Weight: 59 kg 58.8 kg 58.7 kg    Examination:  General exam: Appears calm and comfortable  Appearing.  Alert awake and oriented.  Pleasant to conversation.  Moves all extremities equally.  In normal mood. Respiratory system: Clear to auscultation. Respiratory effort normal. Cardiovascular system: S1 & S2  heard, RRR. No pedal edema. Gastrointestinal system: Soft and nontender. Central nervous system: Alert and oriented. No focal neurological deficits. 1+ bilateral pedal edema.    Data Reviewed: I have personally reviewed following labs and imaging studies  CBC: Recent Labs  Lab 04/04/23 1743  04/05/23 0430 04/06/23 0551 04/07/23 0436 04/08/23 0514  WBC 4.5 3.3* 3.1* 3.0* 3.2*  HGB 9.4* 8.1* 7.9* 7.8* 7.9*  HCT 29.1* 24.2* 23.8* 23.8* 24.2*  MCV 91.5 89.0 89.8 90.2 89.3  PLT 151 153 149* 154 170   Basic Metabolic Panel: Recent Labs  Lab 04/04/23 1743 04/05/23 0430 04/06/23 0551 04/07/23 0436 04/08/23 0514  NA 140 141 139 140 138  K 3.6 3.5 3.5 3.6 4.0  CL 110 111 108 108 107  CO2 23 24 23 22 23   GLUCOSE 89 99 99 91 94  BUN 47* 41* 46* 41* 50*  CREATININE 1.43* 1.28* 1.63* 1.61* 1.81*  CALCIUM 9.0 8.9 8.8* 8.9 8.8*  MG  --   --  1.7 1.7 1.8  PHOS  --   --  3.4 3.1 3.3   GFR: Estimated Creatinine Clearance: 19.5 mL/min (A) (by C-G formula based on SCr of 1.81 mg/dL (H)). Liver Function Tests: Recent Labs  Lab 04/04/23 1743  AST 33  ALT 19  ALKPHOS 29*  BILITOT 0.9  PROT 5.6*  ALBUMIN 3.2*   No results for input(s): "LIPASE", "AMYLASE" in the last 168 hours. No results for input(s): "AMMONIA" in the last 168 hours. Coagulation Profile: No results for input(s): "INR", "PROTIME" in the last 168 hours. Cardiac Enzymes: No results for input(s): "CKTOTAL", "CKMB", "CKMBINDEX", "TROPONINI" in the last 168 hours. BNP (last 3 results) No results for input(s): "PROBNP" in the last 8760 hours. HbA1C: No results for input(s): "HGBA1C" in the last 72 hours. CBG: Recent Labs  Lab 04/04/23 1543  GLUCAP 114*   Lipid Profile: No results for input(s): "CHOL", "HDL", "LDLCALC", "TRIG", "CHOLHDL", "LDLDIRECT" in the last 72 hours. Thyroid Function Tests: No results for input(s): "TSH", "T4TOTAL", "FREET4", "T3FREE", "THYROIDAB" in the last 72 hours. Anemia Panel: No results for input(s): "VITAMINB12", "FOLATE", "FERRITIN", "TIBC", "IRON", "RETICCTPCT" in the last 72 hours. Sepsis Labs: No results for input(s): "PROCALCITON", "LATICACIDVEN" in the last 168 hours.  Recent Results (from the past 240 hour(s))  SARS Coronavirus 2 by RT PCR (hospital order,  performed in [facility] hospital lab) *cepheid single result test* Anterior Nasal Swab     Status: None   Collection Time: 03/31/23  3:49 AM   Specimen: Anterior Nasal Swab  Result Value Ref Range Status   SARS Coronavirus 2 by RT PCR NEGATIVE NEGATIVE Final    Comment: Performed at [facility], [address] [place], [place] [address]         Radiology Studies: No results found.      Scheduled Meds:  amLODipine  10 mg Oral Daily   atorvastatin  40 mg Oral Daily   cloNIDine  0.2 mg Transdermal Q Tue   enoxaparin (LOVENOX) injection  30 mg Subcutaneous Q24H   fluticasone  2 spray Each Nare Daily   hydrALAZINE  100 mg Oral TID   isosorbide mononitrate  30 mg Oral Daily   levothyroxine  137 mcg Oral Q0600   loratadine  10 mg Oral Daily   Continuous Infusions:   LOS: 0 days    Time spent: 35 minutes    Dorcas Carrow, MD [facility]

## 2023-04-08 NOTE — TOC Progression Note (Signed)
Transition of Care [facility]) - Progression Note    Patient Details  Name: Kimberly [name] MRN: [id number] Date of Birth: [birth year]  Transition of Care [facility]) CM/SW Contact  Kermit Balo, RN Phone Number: 04/08/2023, 2:34 PM  Clinical Narrative:     approved 11/26 - 12/1 NRD 12/1 Plan Auth ID [id number]  Pt has received approval through [facility] for [facility]. [facility] wont have a bed until Friday. CM has updated the pts daughter and MD. TOC following.  Expected Discharge Plan: Skilled Nursing Facility Barriers to Discharge: Continued Medical Work up  Expected Discharge Plan and Services   Discharge Planning Services: CM Consult Post Acute Care Choice: Skilled Nursing Facility Living arrangements for the past 2 months: Single Family Home                           HH Arranged: PT, OT           Social Determinants of Health (SDOH) Interventions SDOH Screenings   Food Insecurity: No Food Insecurity (04/04/2023)  Housing: Patient Declined (04/04/2023)  Transportation Needs: No Transportation Needs (04/04/2023)  Utilities: Not At Risk (04/04/2023)  Tobacco Use: Low Risk  (04/04/2023)    Readmission Risk Interventions    04/01/2023   12:22 PM  Readmission Risk Prevention Plan  Transportation Screening Complete  PCP or Specialist Appt within 5-7 Days Complete  Home Care Screening Complete  Medication Review (RN CM) Complete

## 2023-04-08 NOTE — Plan of Care (Signed)
  Problem: Education: Goal: Knowledge of General Education information will improve Description: Including pain rating scale, medication(s)/side effects and non-pharmacologic comfort measures 04/08/2023 0527 by Natasha Mead, RN Outcome: Progressing 04/08/2023 0525 by Natasha Mead, RN Outcome: Progressing   Problem: Health Behavior/Discharge Planning: Goal: Ability to manage health-related needs will improve 04/08/2023 0527 by Natasha Mead, RN Outcome: Progressing 04/08/2023 0525 by Natasha Mead, RN Outcome: Progressing   Problem: Clinical Measurements: Goal: Ability to maintain clinical measurements within normal limits will improve 04/08/2023 0527 by Natasha Mead, RN Outcome: Progressing 04/08/2023 0525 by Natasha Mead, RN Outcome: Progressing Goal: Will remain free from infection 04/08/2023 0527 by Natasha Mead, RN Outcome: Progressing 04/08/2023 0525 by Natasha Mead, RN Outcome: Progressing Goal: Diagnostic test results will improve 04/08/2023 0527 by Natasha Mead, RN Outcome: Progressing 04/08/2023 0525 by Natasha Mead, RN Outcome: Progressing Goal: Respiratory complications will improve 04/08/2023 0527 by Natasha Mead, RN Outcome: Progressing 04/08/2023 0525 by Natasha Mead, RN Outcome: Progressing Goal: Cardiovascular complication will be avoided 04/08/2023 0527 by Natasha Mead, RN Outcome: Progressing 04/08/2023 0525 by Natasha Mead, RN Outcome: Progressing

## 2023-04-08 NOTE — Plan of Care (Signed)

## 2023-04-09 DIAGNOSIS — G9340 Encephalopathy, unspecified: Secondary | ICD-10-CM | POA: Diagnosis not present

## 2023-04-09 MED ORDER — ISOSORBIDE MONONITRATE ER 60 MG PO TB24
60.0000 mg | ORAL_TABLET | Freq: Every day | ORAL | Status: DC
  Administered 2023-04-09 – 2023-04-10 (×2): 60 mg via ORAL
  Filled 2023-04-09 (×2): qty 1

## 2023-04-09 NOTE — Progress Notes (Addendum)
PROGRESS NOTE    Kimberly [name]  LKG:[id number] DOB: May 25, 1940 DOA: 04/04/2023 PCP: Inez Pilgrim, NP    Brief Narrative:  82 year old female with history of chronic diastolic heart failure, stage IIIb CKD, hypertension hyperlipidemia hypothyroidism, sleep apnea on CPAP, hard of hearing presented to the emergency room from home with altered mental status. Recently admitted 11/18-11/21 for hypertensive urgency associated with mild confusion and AKI.  Home dose of valsartan and chlorthalidone were held due to renal dysfunction.  Restarted on clonidine patch.  Patient was discharged to home.  Renal duplexes were negative for restenosis.  Since patient being home she has been increasingly confused, family suspected patient having hallucinations, swelling of her legs.  Walking with slow shuffling gait.  So they brought her to the ER.  She might not have been taking medicine as prescribed.  In the emergency room hemodynamically stable.  Creatinine 1.43.  Blood pressure 211/63.  Admitted due to hypertensive emergency.  Subjective: Patient seen and examined.  She was sleepy with CPAP on.  She gave me thumbs up and denies any complaints.  She wants to get some rest.   Assessment & Plan:   Acute metabolic encephalopathy with underlying cognitive dysfunction Hypertensive emergency with encephalopathy: Likely related to uncontrolled blood pressures. No evidence of infection, UA was bland.  Chest x-ray with no evidence of pneumonia.  MRI without acute intracranial process.  EEG without acute findings. Clinically improving. Home hypertensives resumed including Amlodipine 10 mg daily Isosorbide mononitrate 30 mg daily-increase nitrates to 60 mg daily. Clonidine 0.2 mg patch per week Hydralazine 100 mg 3 times daily Blood pressures are slightly elevated, however given likely noncompliance it will be too aggressive to drop her blood pressure.  Including nitrate today.  No other changes.  Renal  ultrasound, negative for renal artery stenosis Plasma metanephrine, catecholamines within normal limits.  Bilateral leg swelling: Negative for DVT.  Elevate and compression stockings.  CKD stage IIIb: Levels fluctuate but are stable.  Will need close monitoring.  Hypothyroidism: On Synthroid.  Continue.  TSH 7.3.  Hyperlipidemia: On a statin.  Continue.  Sleep apnea, on CPAP at night.    DVT prophylaxis: enoxaparin (LOVENOX) injection 30 mg Start: 04/05/23 1000   Code Status: DNR with limited intervention Family Communication: daughter [name] on the phone.  Disposition Plan: Status is: Observation The patient will require care spanning > 2 midnights and should be moved to inpatient because: Awaiting safe disposition plan     Consultants:  None  Procedures:  None  Antimicrobials:  None     Objective: Vitals:   04/08/23 2357 04/09/23 0441 04/09/23 0448 04/09/23 0817  BP: (!) 163/49 (!) 180/50 (!) 178/50 (!) 173/48  Pulse:  (!) 51  (!) 50  Resp: 18 18  17   Temp: 98.1 F (36.7 C) (!) 97.5 F (36.4 C)  98.2 F (36.8 C)  TempSrc:  Axillary  Oral  SpO2: 99% 99%  97%  Weight:      Height:       No intake or output data in the 24 hours ending 04/09/23 1008  Filed Weights   04/04/23 1502 04/07/23 0500 04/08/23 0500  Weight: 59 kg 58.8 kg 58.7 kg    Examination:  General exam: Appears calm and comfortable  Wants to sleep.  Denies any complaints. Respiratory system: Clear to auscultation. Respiratory effort normal. Cardiovascular system: S1 & S2 heard, RRR. No pedal edema. Gastrointestinal system: Soft and nontender. Central nervous system: Alert and oriented. No focal neurological deficits.  1+ bilateral pedal edema.    Data Reviewed: I have personally reviewed following labs and imaging studies  CBC: Recent Labs  Lab 04/04/23 1743 04/05/23 0430 04/06/23 0551 04/07/23 0436 04/08/23 0514  WBC 4.5 3.3* 3.1* 3.0* 3.2*  HGB 9.4* 8.1* 7.9* 7.8* 7.9*   HCT 29.1* 24.2* 23.8* 23.8* 24.2*  MCV 91.5 89.0 89.8 90.2 89.3  PLT 151 153 149* 154 170   Basic Metabolic Panel: Recent Labs  Lab 04/04/23 1743 04/05/23 0430 04/06/23 0551 04/07/23 0436 04/08/23 0514  NA 140 141 139 140 138  K 3.6 3.5 3.5 3.6 4.0  CL 110 111 108 108 107  CO2 23 24 23 22 23   GLUCOSE 89 99 99 91 94  BUN 47* 41* 46* 41* 50*  CREATININE 1.43* 1.28* 1.63* 1.61* 1.81*  CALCIUM 9.0 8.9 8.8* 8.9 8.8*  MG  --   --  1.7 1.7 1.8  PHOS  --   --  3.4 3.1 3.3   GFR: Estimated Creatinine Clearance: 19.5 mL/min (A) (by C-G formula based on SCr of 1.81 mg/dL (H)). Liver Function Tests: Recent Labs  Lab 04/04/23 1743  AST 33  ALT 19  ALKPHOS 29*  BILITOT 0.9  PROT 5.6*  ALBUMIN 3.2*   No results for input(s): "LIPASE", "AMYLASE" in the last 168 hours. No results for input(s): "AMMONIA" in the last 168 hours. Coagulation Profile: No results for input(s): "INR", "PROTIME" in the last 168 hours. Cardiac Enzymes: No results for input(s): "CKTOTAL", "CKMB", "CKMBINDEX", "TROPONINI" in the last 168 hours. BNP (last 3 results) No results for input(s): "PROBNP" in the last 8760 hours. HbA1C: No results for input(s): "HGBA1C" in the last 72 hours. CBG: Recent Labs  Lab 04/04/23 1543  GLUCAP 114*   Lipid Profile: No results for input(s): "CHOL", "HDL", "LDLCALC", "TRIG", "CHOLHDL", "LDLDIRECT" in the last 72 hours. Thyroid Function Tests: No results for input(s): "TSH", "T4TOTAL", "FREET4", "T3FREE", "THYROIDAB" in the last 72 hours. Anemia Panel: No results for input(s): "VITAMINB12", "FOLATE", "FERRITIN", "TIBC", "IRON", "RETICCTPCT" in the last 72 hours. Sepsis Labs: No results for input(s): "PROCALCITON", "LATICACIDVEN" in the last 168 hours.  Recent Results (from the past 240 hour(s))  SARS Coronavirus 2 by RT PCR (hospital order, performed in [facility] hospital lab) *cepheid single result test* Anterior Nasal Swab     Status: None   Collection Time:  03/31/23  3:49 AM   Specimen: Anterior Nasal Swab  Result Value Ref Range Status   SARS Coronavirus 2 by RT PCR NEGATIVE NEGATIVE Final    Comment: Performed at [facility], [address] [place], [place] [address]         Radiology Studies: No results found.      Scheduled Meds:  amLODipine  10 mg Oral Daily   atorvastatin  40 mg Oral Daily   cloNIDine  0.2 mg Transdermal Q Tue   enoxaparin (LOVENOX) injection  30 mg Subcutaneous Q24H   fluticasone  2 spray Each Nare Daily   hydrALAZINE  100 mg Oral TID   isosorbide mononitrate  60 mg Oral Daily   levothyroxine  137 mcg Oral Q0600   loratadine  10 mg Oral Daily   Continuous Infusions:   LOS: 0 days    Time spent: 35 minutes    Dorcas Carrow, MD [facility]

## 2023-04-09 NOTE — Plan of Care (Signed)

## 2023-04-10 DIAGNOSIS — Z7982 Long term (current) use of aspirin: Secondary | ICD-10-CM | POA: Diagnosis not present

## 2023-04-10 DIAGNOSIS — I160 Hypertensive urgency: Secondary | ICD-10-CM | POA: Diagnosis not present

## 2023-04-10 DIAGNOSIS — I5020 Unspecified systolic (congestive) heart failure: Secondary | ICD-10-CM | POA: Diagnosis not present

## 2023-04-10 DIAGNOSIS — E039 Hypothyroidism, unspecified: Secondary | ICD-10-CM | POA: Diagnosis not present

## 2023-04-10 DIAGNOSIS — Z7401 Bed confinement status: Secondary | ICD-10-CM | POA: Diagnosis not present

## 2023-04-10 DIAGNOSIS — R2681 Unsteadiness on feet: Secondary | ICD-10-CM | POA: Diagnosis not present

## 2023-04-10 DIAGNOSIS — G9340 Encephalopathy, unspecified: Secondary | ICD-10-CM | POA: Diagnosis not present

## 2023-04-10 DIAGNOSIS — Z79899 Other long term (current) drug therapy: Secondary | ICD-10-CM | POA: Diagnosis not present

## 2023-04-10 DIAGNOSIS — I10 Essential (primary) hypertension: Secondary | ICD-10-CM | POA: Diagnosis not present

## 2023-04-10 DIAGNOSIS — N1832 Chronic kidney disease, stage 3b: Secondary | ICD-10-CM | POA: Diagnosis not present

## 2023-04-10 DIAGNOSIS — R400 Somnolence: Secondary | ICD-10-CM | POA: Diagnosis not present

## 2023-04-10 DIAGNOSIS — I130 Hypertensive heart and chronic kidney disease with heart failure and stage 1 through stage 4 chronic kidney disease, or unspecified chronic kidney disease: Secondary | ICD-10-CM | POA: Diagnosis not present

## 2023-04-10 DIAGNOSIS — E1122 Type 2 diabetes mellitus with diabetic chronic kidney disease: Secondary | ICD-10-CM | POA: Diagnosis not present

## 2023-04-10 DIAGNOSIS — I5032 Chronic diastolic (congestive) heart failure: Secondary | ICD-10-CM | POA: Diagnosis not present

## 2023-04-10 DIAGNOSIS — R600 Localized edema: Secondary | ICD-10-CM | POA: Diagnosis not present

## 2023-04-10 DIAGNOSIS — G9341 Metabolic encephalopathy: Secondary | ICD-10-CM | POA: Diagnosis not present

## 2023-04-10 MED ORDER — ISOSORBIDE MONONITRATE ER 60 MG PO TB24: 30.0000 mg | ORAL_TABLET | Freq: Every day | ORAL | Status: AC

## 2023-04-10 NOTE — TOC Transition Note (Signed)
Transition of Care [facility]) - CM/SW Discharge Note   Patient Details  Name: Kimberly [name] MRN: [id number] Date of Birth: 1941/03/23  [facility]) CM/SW Contact:  Kermit Balo, RN Phone Number: 04/10/2023, 10:33 AM   Clinical Narrative:     Pt is discharging to [facility] today. She will transport via [facility]. Daughter is aware and in agreement.  Room: 105 Number for report: [phone]  Final next level of care: Skilled Nursing Facility Barriers to Discharge: No Barriers Identified   Patient Goals and CMS Choice CMS Medicare.gov Compare Post Acute Care list provided to:: Patient Represenative (must comment) Choice offered to / list presented to : Patient, Adult Children  Discharge Placement                         Discharge Plan and Services Additional resources added to the After Visit Summary for     Discharge Planning Services: CM Consult Post Acute Care Choice: Skilled Nursing Facility                    HH Arranged: PT, OT          Social Determinants of Health (SDOH) Interventions SDOH Screenings   Food Insecurity: No Food Insecurity (04/04/2023)  Housing: Patient Declined (04/04/2023)  Transportation Needs: No Transportation Needs (04/04/2023)  Utilities: Not At Risk (04/04/2023)  Tobacco Use: Low Risk  (04/04/2023)     Readmission Risk Interventions    04/01/2023   12:22 PM  Readmission Risk Prevention Plan  Transportation Screening Complete  PCP or Specialist Appt within 5-7 Days Complete  Home Care Screening Complete  Medication Review (RN CM) Complete

## 2023-04-10 NOTE — Discharge Summary (Signed)
Physician Discharge Summary  DENASHA GETTIS ZOX:[id number] DOB: 08-29-1940 DOA: 04/04/2023  PCP: Inez Pilgrim, NP  Admit date: 04/04/2023 Discharge date: 04/10/2023  Admitted From: Home Disposition: Skilled nursing facility  Recommendations for Outpatient Follow-up:  Follow up with PCP in 1-2 weeks Please obtain BMP/CBC in one week   Discharge Condition: Stable CODE STATUS: DNR with limited intervention Diet recommendation: Low-salt diet  Discharge summary: 82 year old female with history of chronic diastolic heart failure, stage IIIb CKD, hypertension hyperlipidemia hypothyroidism, sleep apnea on CPAP, hard of hearing presented to the emergency room from home with altered mental status. Recently admitted 11/18-11/21 for hypertensive urgency associated with mild confusion and AKI.  Home dose of valsartan and chlorthalidone were held due to renal dysfunction.  Restarted on clonidine patch.  Patient was discharged to home.  Renal duplexes were negative for restenosis.  Since patient being home she has been increasingly confused, family suspected patient having hallucinations, swelling of her legs.  Walking with slow shuffling gait.  So they brought her to the ER.  She might not have been taking medicine as prescribed.  In the emergency room hemodynamically stable.  Creatinine 1.43.  Blood pressure 211/63.  Admitted with hypertensive emergency.  Stabilized.  Needing to go to subacute rehab.  Treated for following conditions.   Assessment & Plan:   Acute metabolic encephalopathy with underlying cognitive dysfunction Hypertensive emergency with encephalopathy: Likely related to uncontrolled blood pressures. No evidence of infection, UA was bland.  Chest x-ray with no evidence of pneumonia.  MRI without acute intracranial process.  EEG without acute findings. Clinically improving. Home hypertensives resumed including Amlodipine 10 mg daily Isosorbide mononitrate 30 mg daily-increase  nitrates to 60 mg daily. Clonidine 0.2 mg patch per week Hydralazine 100 mg 3 times daily Blood pressures are slightly variable.  Will keep on current regimen.  Renal ultrasound, negative for renal artery stenosis Plasma metanephrine, catecholamines within normal limits.   Bilateral leg swelling: Negative for DVT.  Elevate and compression stockings.   CKD stage IIIb: Levels fluctuate but are stable.  Will need close monitoring.   Hypothyroidism: On Synthroid.  Continue.  TSH 7.3.   Hyperlipidemia: On a statin.  Continue.   Sleep apnea, on CPAP at night.  Stable to discharge.  Discharge Diagnoses:  Principal Problem:   Acute encephalopathy Active Problems:   Obstructive sleep apnea   (HFpEF) heart failure with preserved ejection fraction (HCC)   Edema of extremities   Hypertensive urgency   Chronic kidney disease, stage 3b (HCC)   Hyperlipidemia    Discharge Instructions  Discharge Instructions     Diet - low sodium heart healthy   Complete by: As directed    Increase activity slowly   Complete by: As directed       Allergies as of 04/10/2023       Reactions   Oxycodone-acetaminophen Shortness Of Breath, Itching, Other (See Comments)   Pt couldn't breathe or catch her breath   Percocet [oxycodone-acetaminophen] Shortness Of Breath, Other (See Comments)   Pt couldn't breathe or catch her breath   Codeine Itching, Other (See Comments)   hyperactivity   Other Other (See Comments)   Some pain medication given at [facility] for surgery caused hallucinations    Promethazine Hcl Nausea And Vomiting, Other (See Comments)   Hallucinations   Clonidine Derivatives Other (See Comments)   Bradycardia (06/2017)   Hydralazine Other (See Comments)   Headache/dizzy   Clonidine Palpitations   Bradycardia (06/2017)   Metoprolol Palpitations  Bradycardia 06/2017        Medication List     STOP taking these medications    furosemide 20 MG tablet Commonly known  as: LASIX   losartan 50 MG tablet Commonly known as: COZAAR   valsartan 160 MG tablet Commonly known as: DIOVAN       TAKE these medications    acetaminophen 500 MG tablet Commonly known as: TYLENOL Take 1,000 mg by mouth every 6 (six) hours as needed for mild pain (pain score 1-3) or moderate pain (pain score 4-6).   albuterol 108 (90 Base) MCG/ACT inhaler Commonly known as: VENTOLIN HFA Inhale 1 puff into the lungs every 4 (four) hours as needed for wheezing or shortness of breath.   amLODipine 10 MG tablet Commonly known as: NORVASC Take 1 tablet (10 mg total) by mouth daily.   aspirin 81 MG chewable tablet Chew 81 mg by mouth daily.   atorvastatin 40 MG tablet Commonly known as: LIPITOR Take 1 tablet (40 mg total) by mouth daily.   cloNIDine 0.2 mg/24hr patch Commonly known as: CATAPRES - Dosed in mg/24 hr Place 1 patch (0.2 mg total) onto the skin once a week.   Fluticasone Propionate (Inhal) 100 MCG/ACT Aepb Inhale 1 puff into the lungs in the morning and at bedtime.   hydrALAZINE 100 MG tablet Commonly known as: APRESOLINE Take 1 tablet (100 mg total) by mouth 3 (three) times daily. What changed: when to take this   isosorbide mononitrate 60 MG 24 hr tablet Commonly known as: IMDUR Take 0.5 tablets (30 mg total) by mouth daily. What changed: medication strength   levothyroxine 137 MCG tablet Commonly known as: SYNTHROID TAKE ONE TABLET BY MOUTH BEFORE BREAKFAST   nitroGLYCERIN 0.4 MG SL tablet Commonly known as: NITROSTAT Place 1 tablet (0.4 mg total) under the tongue every 5 (five) minutes as needed for chest pain.   ondansetron 4 MG tablet Commonly known as: ZOFRAN Take 1 tablet (4 mg total) by mouth every 8 (eight) hours as needed for nausea or vomiting.   PARoxetine 20 MG tablet Commonly known as: PAXIL Take 20 mg by mouth daily.   Vitamin D3 125 MCG (5000 UT) Caps Take 5,000 Units by mouth daily.        Allergies  Allergen Reactions    Oxycodone-Acetaminophen Shortness Of Breath, Itching and Other (See Comments)    Pt couldn't breathe or catch her breath   Percocet [Oxycodone-Acetaminophen] Shortness Of Breath and Other (See Comments)    Pt couldn't breathe or catch her breath   Codeine Itching and Other (See Comments)    hyperactivity    Other Other (See Comments)    Some pain medication given at [facility] for surgery caused hallucinations    Promethazine Hcl Nausea And Vomiting and Other (See Comments)    Hallucinations     Clonidine Derivatives Other (See Comments)    Bradycardia (06/2017)   Hydralazine Other (See Comments)    Headache/dizzy    Clonidine Palpitations    Bradycardia (06/2017)   Metoprolol Palpitations    Bradycardia 06/2017     Consultations: None   Procedures/Studies: DG CHEST PORT 1 VIEW  Result Date: 04/05/2023 CLINICAL DATA:  Altered mental status EXAM: PORTABLE CHEST 1 VIEW COMPARISON:  Chest x-ray 01/18/2023 FINDINGS: The heart is enlarged. The lungs are clear. There is no pleural effusion or pneumothorax. No acute fractures are seen. IMPRESSION: Cardiomegaly. No acute cardiopulmonary process. Electronically Signed   By: Mcneil Sober.D.  On: 04/05/2023 18:10   EEG adult  Result Date: 04/05/2023 Jefferson Fuel, MD     04/05/2023  5:21 PM Routine EEG Report TALMA LIVELY is a 82 y.o. female with a history of altered mental status who is undergoing an EEG to evaluate for seizures. Report: This EEG was acquired with electrodes placed according to the International 10-20 electrode system (including Fp1, Fp2, F3, F4, C3, C4, P3, P4, O1, O2, T3, T4, T5, T6, A1, A2, Fz, Cz, Pz). The following electrodes were missing or displaced: none. The occipital dominant rhythm was 6 Hz. This activity is reactive to stimulation. Drowsiness was manifested by background fragmentation; deeper stages of sleep were identified by K complexes and sleep spindles. There was no focal slowing. There  were no interictal epileptiform discharges. There were no electrographic seizures identified. There was no abnormal response to photic stimulation or hyperventilation. Impression and clinical correlation: This EEG was obtained while awake and asleep and is abnormal due to mild diffuse slowing indicative of global cerebral dysfunction. Epileptiform abnormalities were not seen during this recording. Bing Neighbors, MD [facility] [phone] If 7pm- 7am, please page neurology on call as listed in [facility].   VAS [place] LOWER EXTREMITY VENOUS (DVT)  Result Date: 04/05/2023  Lower Venous DVT Study Patient Name:  KEIDRA DOOSE Lewellen  Date of Exam:   04/05/2023 Medical Rec #: [id number]       Accession #:    [id number] Date of Birth: 1940/08/17       Patient Gender: F Patient Age:   82 years Exam Location:  [facility] Procedure:      VAS [place] LOWER EXTREMITY VENOUS (DVT) Referring Phys: Eston Esters PATEL --------------------------------------------------------------------------------  Indications: Swelling, and Edema.  Comparison Study: No prior exam. Performing Technologist: Fernande Bras  Examination Guidelines: A complete evaluation includes B-mode imaging, spectral Doppler, color Doppler, and power Doppler as needed of all accessible portions of each vessel. Bilateral testing is considered an integral part of a complete examination. Limited examinations for reoccurring indications may be performed as noted. The reflux portion of the exam is performed with the patient in reverse Trendelenburg.  +---------+---------------+---------+-----------+----------+--------------+ RIGHT    CompressibilityPhasicitySpontaneityPropertiesThrombus Aging +---------+---------------+---------+-----------+----------+--------------+ CFV      Full           Yes      Yes                                 +---------+---------------+---------+-----------+----------+--------------+ SFJ      Full                                                         +---------+---------------+---------+-----------+----------+--------------+ FV Prox  Full                                                        +---------+---------------+---------+-----------+----------+--------------+ FV Mid   Full                                                        +---------+---------------+---------+-----------+----------+--------------+  FV DistalFull                                                        +---------+---------------+---------+-----------+----------+--------------+ PFV      Full                                                        +---------+---------------+---------+-----------+----------+--------------+ POP      Full           Yes      Yes                                 +---------+---------------+---------+-----------+----------+--------------+ PTV      Full                                                        +---------+---------------+---------+-----------+----------+--------------+ PERO     Full                                                        +---------+---------------+---------+-----------+----------+--------------+   +---------+---------------+---------+-----------+----------+--------------+ LEFT     CompressibilityPhasicitySpontaneityPropertiesThrombus Aging +---------+---------------+---------+-----------+----------+--------------+ CFV      Full           Yes      Yes                                 +---------+---------------+---------+-----------+----------+--------------+ SFJ      Full                                                        +---------+---------------+---------+-----------+----------+--------------+ FV Prox  Full                                                        +---------+---------------+---------+-----------+----------+--------------+ FV Mid   Full                                                         +---------+---------------+---------+-----------+----------+--------------+ FV DistalFull                                                        +---------+---------------+---------+-----------+----------+--------------+  PFV      Full                                                        +---------+---------------+---------+-----------+----------+--------------+ POP      Full           Yes      Yes                                 +---------+---------------+---------+-----------+----------+--------------+ PTV      Full                                                        +---------+---------------+---------+-----------+----------+--------------+ PERO     Full                                                        +---------+---------------+---------+-----------+----------+--------------+ Cystic structure noted in left popliteal fossa measuring: 3.9 x 1.4 x 1.8 cm    Summary: BILATERAL: - No evidence of deep vein thrombosis seen in the lower extremities, bilaterally. -  LEFT: - A cystic structure is found in the popliteal fossa.  *See table(s) above for measurements and observations. Electronically signed by Gerarda Fraction on 04/05/2023 at 12:35:22 PM.    Final    MR BRAIN WO CONTRAST  Result Date: 04/05/2023 CLINICAL DATA:  Altered mental status, hallucinations, confused EXAM: MRI HEAD WITHOUT CONTRAST TECHNIQUE: Multiplanar, multiecho pulse sequences of the brain and surrounding structures were obtained without intravenous contrast. COMPARISON:  08/25/2014 MRI head, correlation is also made with 03/30/2023 CT head FINDINGS: Brain: No restricted diffusion to suggest acute or subacute infarct. No acute hemorrhage, mass, mass effect, or midline shift. No hydrocephalus or extra-axial collection. Pituitary and craniocervical junction within normal limits. Hemosiderin is associated with a remote infarct in the left parietal lobe. Additional punctate focus of hemosiderin in the  posterior right temporal lobe, likely sequela chronic microhemorrhage. Cerebral volume is within normal limits for age. T2 hyperintense signal in the periventricular white matter, likely the sequela of mild-to-moderate chronic small vessel ischemic disease. Vascular: Normal arterial flow voids. Skull and upper cervical spine: Normal marrow signal. Sinuses/Orbits: Mucosal thickening in the ethmoid air cells. Status post bilateral lens replacements. Other: The mastoid air cells are well aerated. IMPRESSION: No acute intracranial process. No evidence of acute or subacute infarct. Electronically Signed   By: Wiliam Ke M.D.   On: 04/05/2023 03:51   VAS US CAROTID  Result Date: 04/02/2023 Carotid Arterial Duplex Study Patient Name:  AROOJ WATERFORD  Date of Exam:   04/02/2023 Medical Rec #: [id number]       Accession #:    [id number] Date of Birth: [birth year]       Patient Gender: F Patient Age:   89 years Exam Location:  [facility] Procedure:      VAS US CAROTID Referring Phys: RIPUDEEP RAI --------------------------------------------------------------------------------  Indications:  Carotid stenosis-bilateral I65.23. Risk Factors:      Hypertension. Comparison Study:  No prior studies. Performing Technologist: Chanda Busing RVT  Examination Guidelines: A complete evaluation includes B-mode imaging, spectral Doppler, color Doppler, and power Doppler as needed of all accessible portions of each vessel. Bilateral testing is considered an integral part of a complete examination. Limited examinations for reoccurring indications may be performed as noted.  Right Carotid Findings: +----------+--------+--------+--------+--------------------------+--------+           PSV cm/sEDV cm/sStenosisPlaque Description        Comments +----------+--------+--------+--------+--------------------------+--------+ CCA Prox  98      14              irregular and heterogenoustortuous  +----------+--------+--------+--------+--------------------------+--------+ CCA Distal88      13              irregular and heterogenous         +----------+--------+--------+--------+--------------------------+--------+ ICA Prox  220     37              calcific                           +----------+--------+--------+--------+--------------------------+--------+ ICA Mid   166     23              smooth and heterogenous            +----------+--------+--------+--------+--------------------------+--------+ ICA Distal69      16                                        tortuous +----------+--------+--------+--------+--------------------------+--------+ ECA       253     19                                                 +----------+--------+--------+--------+--------------------------+--------+ +----------+--------+-------+--------+-------------------+           PSV cm/sEDV cmsDescribeArm Pressure (mmHG) +----------+--------+-------+--------+-------------------+ [id number]                                        +----------+--------+-------+--------+-------------------+ +---------+--------+--+--------+-+---------+ VertebralPSV cm/s25EDV cm/s6Antegrade +---------+--------+--+--------+-+---------+  Left Carotid Findings: +----------+--------+--------+--------+--------------------------+--------+           PSV cm/sEDV cm/sStenosisPlaque Description        Comments +----------+--------+--------+--------+--------------------------+--------+ CCA Prox  105     17              irregular and heterogenoustortuous +----------+--------+--------+--------+--------------------------+--------+ CCA Distal103     17              irregular and heterogenous         +----------+--------+--------+--------+--------------------------+--------+ ICA Prox  127     18              calcific                            +----------+--------+--------+--------+--------------------------+--------+ ICA Mid   118     20              smooth and heterogenous            +----------+--------+--------+--------+--------------------------+--------+ ICA Distal98  22                                        tortuous +----------+--------+--------+--------+--------------------------+--------+ ECA       189     13                                                 +----------+--------+--------+--------+--------------------------+--------+ +----------+--------+--------+--------+-------------------+           PSV cm/sEDV cm/sDescribeArm Pressure (mmHG) +----------+--------+--------+--------+-------------------+ [id number]                                         +----------+--------+--------+--------+-------------------+ +---------+--------+--+--------+-+---------+ VertebralPSV cm/s31EDV cm/s6Antegrade +---------+--------+--+--------+-+---------+   Summary: Right Carotid: Velocities in the right ICA are consistent with a 1-39% stenosis. Left Carotid: Velocities in the left ICA are consistent with a 1-39% stenosis. Vertebrals: Bilateral vertebral arteries demonstrate antegrade flow. *See table(s) above for measurements and observations.  Electronically signed by Carolynn Sayers on 04/02/2023 at 2:36:25 PM.    Final    VAS US RENAL ARTERY DUPLEX  Result Date: 04/01/2023 ABDOMINAL VISCERAL Patient Name:  NADEJA LORDS Mcgahee  Date of Exam:   04/01/2023 Medical Rec #: [id number]       Accession #:    [id number] Date of Birth: 05/13/1940       Patient Gender: F Patient Age:   4 years Exam Location:  [facility] Procedure:      VAS US RENAL ARTERY DUPLEX Referring Phys: [id number] JESSICA U VANN -------------------------------------------------------------------------------- High Risk Factors: Hypertension, hyperlipidemia. Limitations: Air/bowel gas. Comparison Study: No prior exam. Performing Technologist: Fernande Bras  Examination Guidelines: A complete evaluation includes B-mode imaging, spectral Doppler, color Doppler, and power Doppler as needed of all accessible portions of each vessel. Bilateral testing is considered an integral part of a complete examination. Limited examinations for reoccurring indications may be performed as noted.  Duplex Findings: +----------------------+--------+--------+------+--------+ Mesenteric            PSV cm/sEDV cm/sPlaqueComments +----------------------+--------+--------+------+--------+ Aorta Mid               361                          +----------------------+--------+--------+------+--------+ Celiac Artery Origin    321                          +----------------------+--------+--------+------+--------+ Celiac Artery Proximal  349                          +----------------------+--------+--------+------+--------+ SMA Proximal            388      36                  +----------------------+--------+--------+------+--------+    +------------------+--------+--------+-------+ Right Renal ArteryPSV cm/sEDV cm/sComment +------------------+--------+--------+-------+ Origin              104      17           +------------------+--------+--------+-------+ Proximal            128  31           +------------------+--------+--------+-------+ Mid                 183      25           +------------------+--------+--------+-------+ Distal              186      48           +------------------+--------+--------+-------+ +-----------------+--------+--------+-------+ Left Renal ArteryPSV cm/sEDV cm/sComment +-----------------+--------+--------+-------+ Origin             223      26           +-----------------+--------+--------+-------+ Proximal            83      10           +-----------------+--------+--------+-------+ Mid                 82      13           +-----------------+--------+--------+-------+ Distal               71      13           +-----------------+--------+--------+-------+  Technologist observations: Cystic structure noted in inferior pole of right kidney measuring: 3.1 x 1.9 cm and slight dilation of renal pelvis measuring: 1.2 cm. +------------+--------+--------+----+-----------+--------+--------+----+ Right KidneyPSV cm/sEDV cm/sRI  Left KidneyPSV cm/sEDV cm/sRI   +------------+--------+--------+----+-----------+--------+--------+----+ Upper Pole  30      10      0.65Upper Pole 29      7       0.76 +------------+--------+--------+----+-----------+--------+--------+----+ Mid         40      7       0.        23      7       0.68 +------------+--------+--------+----+-----------+--------+--------+----+ Lower Pole  29      10      0.66Lower Pole 20      7       0.63 +------------+--------+--------+----+-----------+--------+--------+----+ Hilar       40      8       0.79Hilar      27      7       0.75 +------------+--------+--------+----+-----------+--------+--------+----+ +------------------+-----+------------------+----+ Right Kidney           Left Kidney            +------------------+-----+------------------+----+ RAR                    RAR                    +------------------+-----+------------------+----+ RAR (manual)      0.1  RAR (manual)      0.08 +------------------+-----+------------------+----+ Cortex                 Cortex                 +------------------+-----+------------------+----+ Cortex thickness       Corex thickness        +------------------+-----+------------------+----+ Kidney length (cm)11.20Kidney length (cm)9.73 +------------------+-----+------------------+----+  Summary: Renal:  Right: RRV flow present. Normal size right kidney. 1-59% stenosis of        the right renal artery. Abnormal right Resistive Index. Left:  LRV flow present. Normal size of left kidney. Normal left        Resistive Index.  No evidence of left renal artery stenosis.  *See table(s) above for measurements and observations.  Diagnosing physician: Heath Lark  Electronically signed by Heath Lark on 04/01/2023 at 6:15:17 PM.    Final    DG Hip Unilat W or Wo Pelvis 2-3 Views Left  Result Date: 03/30/2023 CLINICAL DATA:  Left hip pain following fall, initial encounter EXAM: DG HIP (WITH OR WITHOUT PELVIS) 3V LEFT COMPARISON:  None Available. FINDINGS: Pelvic ring is intact. Mild degenerative changes of the hip joints are noted bilaterally. No acute fracture or dislocation is noted. No soft tissue abnormality is seen. IMPRESSION: Degenerative change without acute abnormality. Electronically Signed   By: Alcide Clever M.D.   On: 03/30/2023 20:40   DG Shoulder Right  Result Date: 03/30/2023 CLINICAL DATA:  Fall 1 week ago with right shoulder pain, initial encounter EXAM: RIGHT SHOULDER - 2+ VIEW COMPARISON:  None Available. FINDINGS: Degenerative changes of the acromioclavicular and glenohumeral joints are seen. No acute fracture or dislocation is noted. No soft tissue abnormality is seen. IMPRESSION: Degenerative change without acute abnormality. Electronically Signed   By: Alcide Clever M.D.   On: 03/30/2023 20:39   CT Head Wo Contrast  Result Date: 03/30/2023 CLINICAL DATA:  Mental status change, unknown cause EXAM: CT HEAD WITHOUT CONTRAST TECHNIQUE: Contiguous axial images were obtained from the base of the skull through the vertex without intravenous contrast. RADIATION DOSE REDUCTION: This exam was performed according to the departmental dose-optimization program which includes automated exposure control, adjustment of the mA and/or kV according to patient size and/or use of iterative reconstruction technique. COMPARISON:  CT Head 12/24/19 FINDINGS: Brain: No hemorrhage. No hydrocephalus. No extra-axial fluid collection. No mass effect. No mass lesion. 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 Vascular: No hyperdense  vessel or unexpected calcification. Skull: Normal. Negative for fracture or focal lesion. Sinuses/Orbits: No middle ear mastoid effusion. Paranasal sinuses are clear. Mucosal thickening bilateral maxillary sinuses. Bilateral lens replacement. Orbits are otherwise unremarkable. Other: None. IMPRESSION: No acute intracranial abnormality. Electronically Signed   By: Lorenza Cambridge M.D.   On: 03/30/2023 20:30   (Echo, Carotid, EGD, Colonoscopy, ERCP)    Subjective: Patient seen and examined.  No overnight events.  Denies any complaints.   Discharge Exam: Vitals:   04/10/23 0341 04/10/23 0753  BP: 135/60 (!) 167/52  Pulse: 60 (!) 52  Resp: 18 16  Temp: 98 F (36.7 C) 98.8 F (37.1 C)  SpO2: 99% 97%   Vitals:   04/10/23 0015 04/10/23 0341 04/10/23 0414 04/10/23 0753  BP: (!) 156/48 135/60  (!) 167/52  Pulse: (!) 54 60  (!) 52  Resp: 18 18  16   Temp: 98.5 F (36.9 C) 98 F (36.7 C)  98.8 F (37.1 C)  TempSrc: Oral Axillary  Oral  SpO2: 99% 99%  97%  Weight:   62.4 kg   Height:        General: Pt is alert, awake, not in acute distress Cardiovascular: RRR, S1/S2 +, no rubs, no gallops Respiratory: CTA bilaterally, no wheezing, no rhonchi Abdominal: Soft, NT, ND, bowel sounds + Extremities: no edema, no cyanosis    The results of significant diagnostics from this hospitalization (including imaging, microbiology, ancillary and laboratory) are listed below for reference.     Microbiology: No results found for this or any previous visit (from the past 240 hour(s)).   Labs: BNP (last 3 results) Recent Labs    01/18/23 1416  BNP 230.6*   Basic Metabolic Panel:  Recent Labs  Lab 04/04/23 1743 04/05/23 0430 04/06/23 0551 04/07/23 0436 04/08/23 0514  NA 140 141 139 140 138  K 3.6 3.5 3.5 3.6 4.0  CL 110 111 108 108 107  CO2 23 24 23 22 23   GLUCOSE 89 99 99 91 94  BUN 47* 41* 46* 41* 50*  CREATININE 1.43* 1.28* 1.63* 1.61* 1.81*  CALCIUM 9.0 8.9 8.8* 8.9 8.8*  MG  --    --  1.7 1.7 1.8  PHOS  --   --  3.4 3.1 3.3   Liver Function Tests: Recent Labs  Lab 04/04/23 1743  AST 33  ALT 19  ALKPHOS 29*  BILITOT 0.9  PROT 5.6*  ALBUMIN 3.2*   No results for input(s): "LIPASE", "AMYLASE" in the last 168 hours. No results for input(s): "AMMONIA" in the last 168 hours. CBC: Recent Labs  Lab 04/04/23 1743 04/05/23 0430 04/06/23 0551 04/07/23 0436 04/08/23 0514  WBC 4.5 3.3* 3.1* 3.0* 3.2*  HGB 9.4* 8.1* 7.9* 7.8* 7.9*  HCT 29.1* 24.2* 23.8* 23.8* 24.2*  MCV 91.5 89.0 89.8 90.2 89.3  PLT 151 153 149* 154 170   Cardiac Enzymes: No results for input(s): "CKTOTAL", "CKMB", "CKMBINDEX", "TROPONINI" in the last 168 hours. BNP: Invalid input(s): "POCBNP" CBG: Recent Labs  Lab 04/04/23 1543  GLUCAP 114*   D-Dimer No results for input(s): "DDIMER" in the last 72 hours. Hgb A1c No results for input(s): "HGBA1C" in the last 72 hours. Lipid Profile No results for input(s): "CHOL", "HDL", "LDLCALC", "TRIG", "CHOLHDL", "LDLDIRECT" in the last 72 hours. Thyroid function studies No results for input(s): "TSH", "T4TOTAL", "T3FREE", "THYROIDAB" in the last 72 hours.  Invalid input(s): "FREET3" Anemia work up No results for input(s): "VITAMINB12", "FOLATE", "FERRITIN", "TIBC", "IRON", "RETICCTPCT" in the last 72 hours. Urinalysis    Component Value Date/Time   COLORURINE YELLOW 04/04/2023 1743   APPEARANCEUR CLEAR 04/04/2023 1743   LABSPEC 1.012 04/04/2023 1743   PHURINE 5.0 04/04/2023 1743   GLUCOSEU NEGATIVE 04/04/2023 1743   HGBUR SMALL (A) 04/04/2023 1743   BILIRUBINUR NEGATIVE 04/04/2023 1743   KETONESUR NEGATIVE 04/04/2023 1743   PROTEINUR >=300 (A) 04/04/2023 1743   NITRITE NEGATIVE 04/04/2023 1743   LEUKOCYTESUR NEGATIVE 04/04/2023 1743   Sepsis Labs Recent Labs  Lab 04/05/23 0430 04/06/23 0551 04/07/23 0436 04/08/23 0514  WBC 3.3* 3.1* 3.0* 3.2*   Microbiology No results found for this or any previous visit (from the past 240  hour(s)).   Time coordinating discharge: 35 minutes  SIGNED:   Dorcas Carrow, MD  [facility] 04/10/2023, 10:04 AM

## 2023-04-10 NOTE — Plan of Care (Signed)

## 2023-04-12 DIAGNOSIS — G9341 Metabolic encephalopathy: Secondary | ICD-10-CM | POA: Diagnosis not present

## 2023-04-12 DIAGNOSIS — R600 Localized edema: Secondary | ICD-10-CM | POA: Diagnosis not present

## 2023-04-12 DIAGNOSIS — I5032 Chronic diastolic (congestive) heart failure: Secondary | ICD-10-CM | POA: Diagnosis not present

## 2023-04-12 DIAGNOSIS — I10 Essential (primary) hypertension: Secondary | ICD-10-CM | POA: Diagnosis not present

## 2023-04-12 DIAGNOSIS — R400 Somnolence: Secondary | ICD-10-CM | POA: Diagnosis not present

## 2023-04-12 DIAGNOSIS — E039 Hypothyroidism, unspecified: Secondary | ICD-10-CM | POA: Diagnosis not present

## 2023-04-26 DIAGNOSIS — R600 Localized edema: Secondary | ICD-10-CM | POA: Diagnosis not present

## 2023-04-26 DIAGNOSIS — I5020 Unspecified systolic (congestive) heart failure: Secondary | ICD-10-CM | POA: Diagnosis not present

## 2023-04-26 DIAGNOSIS — R2681 Unsteadiness on feet: Secondary | ICD-10-CM | POA: Diagnosis not present

## 2023-05-03 DIAGNOSIS — F419 Anxiety disorder, unspecified: Secondary | ICD-10-CM | POA: Diagnosis not present

## 2023-05-03 DIAGNOSIS — N179 Acute kidney failure, unspecified: Secondary | ICD-10-CM | POA: Diagnosis not present

## 2023-05-03 DIAGNOSIS — G9341 Metabolic encephalopathy: Secondary | ICD-10-CM | POA: Diagnosis not present

## 2023-05-03 DIAGNOSIS — I130 Hypertensive heart and chronic kidney disease with heart failure and stage 1 through stage 4 chronic kidney disease, or unspecified chronic kidney disease: Secondary | ICD-10-CM | POA: Diagnosis not present

## 2023-05-03 DIAGNOSIS — I2510 Atherosclerotic heart disease of native coronary artery without angina pectoris: Secondary | ICD-10-CM | POA: Diagnosis not present

## 2023-05-03 DIAGNOSIS — I161 Hypertensive emergency: Secondary | ICD-10-CM | POA: Diagnosis not present

## 2023-05-03 DIAGNOSIS — E785 Hyperlipidemia, unspecified: Secondary | ICD-10-CM | POA: Diagnosis not present

## 2023-05-03 DIAGNOSIS — N1832 Chronic kidney disease, stage 3b: Secondary | ICD-10-CM | POA: Diagnosis not present

## 2023-05-03 DIAGNOSIS — I5032 Chronic diastolic (congestive) heart failure: Secondary | ICD-10-CM | POA: Diagnosis not present

## 2023-05-04 DIAGNOSIS — N179 Acute kidney failure, unspecified: Secondary | ICD-10-CM | POA: Diagnosis not present

## 2023-05-04 DIAGNOSIS — I161 Hypertensive emergency: Secondary | ICD-10-CM | POA: Diagnosis not present

## 2023-05-04 DIAGNOSIS — I2510 Atherosclerotic heart disease of native coronary artery without angina pectoris: Secondary | ICD-10-CM | POA: Diagnosis not present

## 2023-05-04 DIAGNOSIS — E785 Hyperlipidemia, unspecified: Secondary | ICD-10-CM | POA: Diagnosis not present

## 2023-05-04 DIAGNOSIS — F419 Anxiety disorder, unspecified: Secondary | ICD-10-CM | POA: Diagnosis not present

## 2023-05-04 DIAGNOSIS — G9341 Metabolic encephalopathy: Secondary | ICD-10-CM | POA: Diagnosis not present

## 2023-05-04 DIAGNOSIS — I130 Hypertensive heart and chronic kidney disease with heart failure and stage 1 through stage 4 chronic kidney disease, or unspecified chronic kidney disease: Secondary | ICD-10-CM | POA: Diagnosis not present

## 2023-05-04 DIAGNOSIS — I5032 Chronic diastolic (congestive) heart failure: Secondary | ICD-10-CM | POA: Diagnosis not present

## 2023-05-04 DIAGNOSIS — N1832 Chronic kidney disease, stage 3b: Secondary | ICD-10-CM | POA: Diagnosis not present

## 2023-05-11 DIAGNOSIS — I5032 Chronic diastolic (congestive) heart failure: Secondary | ICD-10-CM | POA: Diagnosis not present

## 2023-05-11 DIAGNOSIS — I2510 Atherosclerotic heart disease of native coronary artery without angina pectoris: Secondary | ICD-10-CM | POA: Diagnosis not present

## 2023-05-11 DIAGNOSIS — G9341 Metabolic encephalopathy: Secondary | ICD-10-CM | POA: Diagnosis not present

## 2023-05-11 DIAGNOSIS — E785 Hyperlipidemia, unspecified: Secondary | ICD-10-CM | POA: Diagnosis not present

## 2023-05-11 DIAGNOSIS — F419 Anxiety disorder, unspecified: Secondary | ICD-10-CM | POA: Diagnosis not present

## 2023-05-11 DIAGNOSIS — I130 Hypertensive heart and chronic kidney disease with heart failure and stage 1 through stage 4 chronic kidney disease, or unspecified chronic kidney disease: Secondary | ICD-10-CM | POA: Diagnosis not present

## 2023-05-11 DIAGNOSIS — I161 Hypertensive emergency: Secondary | ICD-10-CM | POA: Diagnosis not present

## 2023-05-11 DIAGNOSIS — N179 Acute kidney failure, unspecified: Secondary | ICD-10-CM | POA: Diagnosis not present

## 2023-05-11 DIAGNOSIS — N1832 Chronic kidney disease, stage 3b: Secondary | ICD-10-CM | POA: Diagnosis not present

## 2023-05-12 DIAGNOSIS — H5789 Other specified disorders of eye and adnexa: Secondary | ICD-10-CM | POA: Diagnosis not present

## 2023-05-12 DIAGNOSIS — I10 Essential (primary) hypertension: Secondary | ICD-10-CM | POA: Diagnosis not present

## 2023-05-12 DIAGNOSIS — R252 Cramp and spasm: Secondary | ICD-10-CM | POA: Diagnosis not present

## 2023-05-12 DIAGNOSIS — E039 Hypothyroidism, unspecified: Secondary | ICD-10-CM | POA: Diagnosis not present

## 2023-05-12 DIAGNOSIS — F339 Major depressive disorder, recurrent, unspecified: Secondary | ICD-10-CM | POA: Diagnosis not present

## 2023-05-12 DIAGNOSIS — N1832 Chronic kidney disease, stage 3b: Secondary | ICD-10-CM | POA: Diagnosis not present

## 2023-05-12 DIAGNOSIS — R600 Localized edema: Secondary | ICD-10-CM | POA: Diagnosis not present

## 2023-05-12 DIAGNOSIS — I5032 Chronic diastolic (congestive) heart failure: Secondary | ICD-10-CM | POA: Diagnosis not present

## 2023-05-12 DIAGNOSIS — F331 Major depressive disorder, recurrent, moderate: Secondary | ICD-10-CM | POA: Diagnosis not present

## 2023-05-15 DIAGNOSIS — I2510 Atherosclerotic heart disease of native coronary artery without angina pectoris: Secondary | ICD-10-CM | POA: Diagnosis not present

## 2023-05-15 DIAGNOSIS — I5032 Chronic diastolic (congestive) heart failure: Secondary | ICD-10-CM | POA: Diagnosis not present

## 2023-05-15 DIAGNOSIS — N179 Acute kidney failure, unspecified: Secondary | ICD-10-CM | POA: Diagnosis not present

## 2023-05-15 DIAGNOSIS — G9341 Metabolic encephalopathy: Secondary | ICD-10-CM | POA: Diagnosis not present

## 2023-05-15 DIAGNOSIS — N1832 Chronic kidney disease, stage 3b: Secondary | ICD-10-CM | POA: Diagnosis not present

## 2023-05-15 DIAGNOSIS — F419 Anxiety disorder, unspecified: Secondary | ICD-10-CM | POA: Diagnosis not present

## 2023-05-15 DIAGNOSIS — I130 Hypertensive heart and chronic kidney disease with heart failure and stage 1 through stage 4 chronic kidney disease, or unspecified chronic kidney disease: Secondary | ICD-10-CM | POA: Diagnosis not present

## 2023-05-15 DIAGNOSIS — E785 Hyperlipidemia, unspecified: Secondary | ICD-10-CM | POA: Diagnosis not present

## 2023-05-15 DIAGNOSIS — I161 Hypertensive emergency: Secondary | ICD-10-CM | POA: Diagnosis not present

## 2023-05-18 DIAGNOSIS — I2510 Atherosclerotic heart disease of native coronary artery without angina pectoris: Secondary | ICD-10-CM | POA: Diagnosis not present

## 2023-05-18 DIAGNOSIS — I5032 Chronic diastolic (congestive) heart failure: Secondary | ICD-10-CM | POA: Diagnosis not present

## 2023-05-18 DIAGNOSIS — I161 Hypertensive emergency: Secondary | ICD-10-CM | POA: Diagnosis not present

## 2023-05-18 DIAGNOSIS — N179 Acute kidney failure, unspecified: Secondary | ICD-10-CM | POA: Diagnosis not present

## 2023-05-18 DIAGNOSIS — I130 Hypertensive heart and chronic kidney disease with heart failure and stage 1 through stage 4 chronic kidney disease, or unspecified chronic kidney disease: Secondary | ICD-10-CM | POA: Diagnosis not present

## 2023-05-18 DIAGNOSIS — F419 Anxiety disorder, unspecified: Secondary | ICD-10-CM | POA: Diagnosis not present

## 2023-05-18 DIAGNOSIS — N1832 Chronic kidney disease, stage 3b: Secondary | ICD-10-CM | POA: Diagnosis not present

## 2023-05-18 DIAGNOSIS — G9341 Metabolic encephalopathy: Secondary | ICD-10-CM | POA: Diagnosis not present

## 2023-05-18 DIAGNOSIS — E785 Hyperlipidemia, unspecified: Secondary | ICD-10-CM | POA: Diagnosis not present

## 2023-05-21 DIAGNOSIS — F419 Anxiety disorder, unspecified: Secondary | ICD-10-CM | POA: Diagnosis not present

## 2023-05-21 DIAGNOSIS — I161 Hypertensive emergency: Secondary | ICD-10-CM | POA: Diagnosis not present

## 2023-05-21 DIAGNOSIS — I5032 Chronic diastolic (congestive) heart failure: Secondary | ICD-10-CM | POA: Diagnosis not present

## 2023-05-21 DIAGNOSIS — N179 Acute kidney failure, unspecified: Secondary | ICD-10-CM | POA: Diagnosis not present

## 2023-05-21 DIAGNOSIS — I130 Hypertensive heart and chronic kidney disease with heart failure and stage 1 through stage 4 chronic kidney disease, or unspecified chronic kidney disease: Secondary | ICD-10-CM | POA: Diagnosis not present

## 2023-05-21 DIAGNOSIS — N1832 Chronic kidney disease, stage 3b: Secondary | ICD-10-CM | POA: Diagnosis not present

## 2023-05-21 DIAGNOSIS — I2510 Atherosclerotic heart disease of native coronary artery without angina pectoris: Secondary | ICD-10-CM | POA: Diagnosis not present

## 2023-05-21 DIAGNOSIS — G9341 Metabolic encephalopathy: Secondary | ICD-10-CM | POA: Diagnosis not present

## 2023-05-21 DIAGNOSIS — E785 Hyperlipidemia, unspecified: Secondary | ICD-10-CM | POA: Diagnosis not present

## 2023-05-22 DIAGNOSIS — N1832 Chronic kidney disease, stage 3b: Secondary | ICD-10-CM | POA: Diagnosis not present

## 2023-05-22 DIAGNOSIS — E785 Hyperlipidemia, unspecified: Secondary | ICD-10-CM | POA: Diagnosis not present

## 2023-05-22 DIAGNOSIS — F419 Anxiety disorder, unspecified: Secondary | ICD-10-CM | POA: Diagnosis not present

## 2023-05-22 DIAGNOSIS — G9341 Metabolic encephalopathy: Secondary | ICD-10-CM | POA: Diagnosis not present

## 2023-05-22 DIAGNOSIS — I161 Hypertensive emergency: Secondary | ICD-10-CM | POA: Diagnosis not present

## 2023-05-22 DIAGNOSIS — I130 Hypertensive heart and chronic kidney disease with heart failure and stage 1 through stage 4 chronic kidney disease, or unspecified chronic kidney disease: Secondary | ICD-10-CM | POA: Diagnosis not present

## 2023-05-22 DIAGNOSIS — I2510 Atherosclerotic heart disease of native coronary artery without angina pectoris: Secondary | ICD-10-CM | POA: Diagnosis not present

## 2023-05-22 DIAGNOSIS — N179 Acute kidney failure, unspecified: Secondary | ICD-10-CM | POA: Diagnosis not present

## 2023-05-22 DIAGNOSIS — I5032 Chronic diastolic (congestive) heart failure: Secondary | ICD-10-CM | POA: Diagnosis not present

## 2023-05-25 DIAGNOSIS — N179 Acute kidney failure, unspecified: Secondary | ICD-10-CM | POA: Diagnosis not present

## 2023-05-25 DIAGNOSIS — N1832 Chronic kidney disease, stage 3b: Secondary | ICD-10-CM | POA: Diagnosis not present

## 2023-05-25 DIAGNOSIS — I161 Hypertensive emergency: Secondary | ICD-10-CM | POA: Diagnosis not present

## 2023-05-25 DIAGNOSIS — F419 Anxiety disorder, unspecified: Secondary | ICD-10-CM | POA: Diagnosis not present

## 2023-05-25 DIAGNOSIS — I130 Hypertensive heart and chronic kidney disease with heart failure and stage 1 through stage 4 chronic kidney disease, or unspecified chronic kidney disease: Secondary | ICD-10-CM | POA: Diagnosis not present

## 2023-05-25 DIAGNOSIS — I5032 Chronic diastolic (congestive) heart failure: Secondary | ICD-10-CM | POA: Diagnosis not present

## 2023-05-25 DIAGNOSIS — E785 Hyperlipidemia, unspecified: Secondary | ICD-10-CM | POA: Diagnosis not present

## 2023-05-25 DIAGNOSIS — G9341 Metabolic encephalopathy: Secondary | ICD-10-CM | POA: Diagnosis not present

## 2023-05-25 DIAGNOSIS — I2510 Atherosclerotic heart disease of native coronary artery without angina pectoris: Secondary | ICD-10-CM | POA: Diagnosis not present

## 2023-05-26 DIAGNOSIS — E785 Hyperlipidemia, unspecified: Secondary | ICD-10-CM | POA: Diagnosis not present

## 2023-05-26 DIAGNOSIS — F419 Anxiety disorder, unspecified: Secondary | ICD-10-CM | POA: Diagnosis not present

## 2023-05-26 DIAGNOSIS — I5032 Chronic diastolic (congestive) heart failure: Secondary | ICD-10-CM | POA: Diagnosis not present

## 2023-05-26 DIAGNOSIS — N179 Acute kidney failure, unspecified: Secondary | ICD-10-CM | POA: Diagnosis not present

## 2023-05-26 DIAGNOSIS — I130 Hypertensive heart and chronic kidney disease with heart failure and stage 1 through stage 4 chronic kidney disease, or unspecified chronic kidney disease: Secondary | ICD-10-CM | POA: Diagnosis not present

## 2023-05-26 DIAGNOSIS — G9341 Metabolic encephalopathy: Secondary | ICD-10-CM | POA: Diagnosis not present

## 2023-05-26 DIAGNOSIS — I2510 Atherosclerotic heart disease of native coronary artery without angina pectoris: Secondary | ICD-10-CM | POA: Diagnosis not present

## 2023-05-26 DIAGNOSIS — I161 Hypertensive emergency: Secondary | ICD-10-CM | POA: Diagnosis not present

## 2023-05-26 DIAGNOSIS — N1832 Chronic kidney disease, stage 3b: Secondary | ICD-10-CM | POA: Diagnosis not present

## 2023-06-01 DIAGNOSIS — R600 Localized edema: Secondary | ICD-10-CM | POA: Diagnosis not present

## 2023-06-01 DIAGNOSIS — I83893 Varicose veins of bilateral lower extremities with other complications: Secondary | ICD-10-CM | POA: Diagnosis not present

## 2023-06-02 DIAGNOSIS — I161 Hypertensive emergency: Secondary | ICD-10-CM | POA: Diagnosis not present

## 2023-06-02 DIAGNOSIS — I5032 Chronic diastolic (congestive) heart failure: Secondary | ICD-10-CM | POA: Diagnosis not present

## 2023-06-02 DIAGNOSIS — I130 Hypertensive heart and chronic kidney disease with heart failure and stage 1 through stage 4 chronic kidney disease, or unspecified chronic kidney disease: Secondary | ICD-10-CM | POA: Diagnosis not present

## 2023-06-02 DIAGNOSIS — E785 Hyperlipidemia, unspecified: Secondary | ICD-10-CM | POA: Diagnosis not present

## 2023-06-02 DIAGNOSIS — F419 Anxiety disorder, unspecified: Secondary | ICD-10-CM | POA: Diagnosis not present

## 2023-06-02 DIAGNOSIS — N179 Acute kidney failure, unspecified: Secondary | ICD-10-CM | POA: Diagnosis not present

## 2023-06-02 DIAGNOSIS — I2510 Atherosclerotic heart disease of native coronary artery without angina pectoris: Secondary | ICD-10-CM | POA: Diagnosis not present

## 2023-06-02 DIAGNOSIS — N1832 Chronic kidney disease, stage 3b: Secondary | ICD-10-CM | POA: Diagnosis not present

## 2023-06-02 DIAGNOSIS — G9341 Metabolic encephalopathy: Secondary | ICD-10-CM | POA: Diagnosis not present

## 2023-06-02 DIAGNOSIS — G4733 Obstructive sleep apnea (adult) (pediatric): Secondary | ICD-10-CM | POA: Diagnosis not present

## 2023-06-03 DIAGNOSIS — I2510 Atherosclerotic heart disease of native coronary artery without angina pectoris: Secondary | ICD-10-CM | POA: Diagnosis not present

## 2023-06-03 DIAGNOSIS — E785 Hyperlipidemia, unspecified: Secondary | ICD-10-CM | POA: Diagnosis not present

## 2023-06-03 DIAGNOSIS — G9341 Metabolic encephalopathy: Secondary | ICD-10-CM | POA: Diagnosis not present

## 2023-06-03 DIAGNOSIS — I161 Hypertensive emergency: Secondary | ICD-10-CM | POA: Diagnosis not present

## 2023-06-03 DIAGNOSIS — F419 Anxiety disorder, unspecified: Secondary | ICD-10-CM | POA: Diagnosis not present

## 2023-06-03 DIAGNOSIS — I130 Hypertensive heart and chronic kidney disease with heart failure and stage 1 through stage 4 chronic kidney disease, or unspecified chronic kidney disease: Secondary | ICD-10-CM | POA: Diagnosis not present

## 2023-06-03 DIAGNOSIS — N1832 Chronic kidney disease, stage 3b: Secondary | ICD-10-CM | POA: Diagnosis not present

## 2023-06-03 DIAGNOSIS — I5032 Chronic diastolic (congestive) heart failure: Secondary | ICD-10-CM | POA: Diagnosis not present

## 2023-06-03 DIAGNOSIS — N179 Acute kidney failure, unspecified: Secondary | ICD-10-CM | POA: Diagnosis not present

## 2023-06-11 DIAGNOSIS — I130 Hypertensive heart and chronic kidney disease with heart failure and stage 1 through stage 4 chronic kidney disease, or unspecified chronic kidney disease: Secondary | ICD-10-CM | POA: Diagnosis not present

## 2023-06-11 DIAGNOSIS — I5032 Chronic diastolic (congestive) heart failure: Secondary | ICD-10-CM | POA: Diagnosis not present

## 2023-06-11 DIAGNOSIS — G9341 Metabolic encephalopathy: Secondary | ICD-10-CM | POA: Diagnosis not present

## 2023-06-11 DIAGNOSIS — I2510 Atherosclerotic heart disease of native coronary artery without angina pectoris: Secondary | ICD-10-CM | POA: Diagnosis not present

## 2023-06-11 DIAGNOSIS — N1832 Chronic kidney disease, stage 3b: Secondary | ICD-10-CM | POA: Diagnosis not present

## 2023-06-11 DIAGNOSIS — F419 Anxiety disorder, unspecified: Secondary | ICD-10-CM | POA: Diagnosis not present

## 2023-06-11 DIAGNOSIS — I161 Hypertensive emergency: Secondary | ICD-10-CM | POA: Diagnosis not present

## 2023-06-11 DIAGNOSIS — E785 Hyperlipidemia, unspecified: Secondary | ICD-10-CM | POA: Diagnosis not present

## 2023-06-11 DIAGNOSIS — N179 Acute kidney failure, unspecified: Secondary | ICD-10-CM | POA: Diagnosis not present

## 2023-06-15 DIAGNOSIS — F419 Anxiety disorder, unspecified: Secondary | ICD-10-CM | POA: Diagnosis not present

## 2023-06-15 DIAGNOSIS — E785 Hyperlipidemia, unspecified: Secondary | ICD-10-CM | POA: Diagnosis not present

## 2023-06-15 DIAGNOSIS — I5032 Chronic diastolic (congestive) heart failure: Secondary | ICD-10-CM | POA: Diagnosis not present

## 2023-06-15 DIAGNOSIS — I130 Hypertensive heart and chronic kidney disease with heart failure and stage 1 through stage 4 chronic kidney disease, or unspecified chronic kidney disease: Secondary | ICD-10-CM | POA: Diagnosis not present

## 2023-06-15 DIAGNOSIS — N1832 Chronic kidney disease, stage 3b: Secondary | ICD-10-CM | POA: Diagnosis not present

## 2023-06-15 DIAGNOSIS — N179 Acute kidney failure, unspecified: Secondary | ICD-10-CM | POA: Diagnosis not present

## 2023-06-15 DIAGNOSIS — G9341 Metabolic encephalopathy: Secondary | ICD-10-CM | POA: Diagnosis not present

## 2023-06-15 DIAGNOSIS — I161 Hypertensive emergency: Secondary | ICD-10-CM | POA: Diagnosis not present

## 2023-06-15 DIAGNOSIS — I2510 Atherosclerotic heart disease of native coronary artery without angina pectoris: Secondary | ICD-10-CM | POA: Diagnosis not present

## 2023-06-16 DIAGNOSIS — N1832 Chronic kidney disease, stage 3b: Secondary | ICD-10-CM | POA: Diagnosis not present

## 2023-06-16 DIAGNOSIS — F419 Anxiety disorder, unspecified: Secondary | ICD-10-CM | POA: Diagnosis not present

## 2023-06-16 DIAGNOSIS — I5032 Chronic diastolic (congestive) heart failure: Secondary | ICD-10-CM | POA: Diagnosis not present

## 2023-06-16 DIAGNOSIS — I161 Hypertensive emergency: Secondary | ICD-10-CM | POA: Diagnosis not present

## 2023-06-16 DIAGNOSIS — G9341 Metabolic encephalopathy: Secondary | ICD-10-CM | POA: Diagnosis not present

## 2023-06-16 DIAGNOSIS — I130 Hypertensive heart and chronic kidney disease with heart failure and stage 1 through stage 4 chronic kidney disease, or unspecified chronic kidney disease: Secondary | ICD-10-CM | POA: Diagnosis not present

## 2023-06-16 DIAGNOSIS — E785 Hyperlipidemia, unspecified: Secondary | ICD-10-CM | POA: Diagnosis not present

## 2023-06-16 DIAGNOSIS — I2510 Atherosclerotic heart disease of native coronary artery without angina pectoris: Secondary | ICD-10-CM | POA: Diagnosis not present

## 2023-06-16 DIAGNOSIS — N179 Acute kidney failure, unspecified: Secondary | ICD-10-CM | POA: Diagnosis not present

## 2023-06-23 DIAGNOSIS — G9341 Metabolic encephalopathy: Secondary | ICD-10-CM | POA: Diagnosis not present

## 2023-06-23 DIAGNOSIS — N1832 Chronic kidney disease, stage 3b: Secondary | ICD-10-CM | POA: Diagnosis not present

## 2023-06-23 DIAGNOSIS — I130 Hypertensive heart and chronic kidney disease with heart failure and stage 1 through stage 4 chronic kidney disease, or unspecified chronic kidney disease: Secondary | ICD-10-CM | POA: Diagnosis not present

## 2023-06-23 DIAGNOSIS — I5032 Chronic diastolic (congestive) heart failure: Secondary | ICD-10-CM | POA: Diagnosis not present

## 2023-06-23 DIAGNOSIS — N179 Acute kidney failure, unspecified: Secondary | ICD-10-CM | POA: Diagnosis not present

## 2023-06-23 DIAGNOSIS — I2510 Atherosclerotic heart disease of native coronary artery without angina pectoris: Secondary | ICD-10-CM | POA: Diagnosis not present

## 2023-06-23 DIAGNOSIS — F419 Anxiety disorder, unspecified: Secondary | ICD-10-CM | POA: Diagnosis not present

## 2023-06-23 DIAGNOSIS — I161 Hypertensive emergency: Secondary | ICD-10-CM | POA: Diagnosis not present

## 2023-06-23 DIAGNOSIS — E785 Hyperlipidemia, unspecified: Secondary | ICD-10-CM | POA: Diagnosis not present

## 2023-06-24 ENCOUNTER — Ambulatory Visit: Payer: Medicare PPO | Admitting: Cardiology

## 2023-07-02 ENCOUNTER — Other Ambulatory Visit: Payer: Self-pay | Admitting: Cardiology

## 2023-07-08 ENCOUNTER — Other Ambulatory Visit: Payer: Self-pay | Admitting: Cardiology

## 2023-07-23 DIAGNOSIS — R69 Illness, unspecified: Secondary | ICD-10-CM | POA: Diagnosis not present

## 2023-07-31 ENCOUNTER — Other Ambulatory Visit: Payer: Self-pay | Admitting: Internal Medicine

## 2023-07-31 DIAGNOSIS — E890 Postprocedural hypothyroidism: Secondary | ICD-10-CM

## 2023-08-18 ENCOUNTER — Ambulatory Visit: Payer: Medicare PPO | Attending: Cardiology | Admitting: Cardiology

## 2023-08-18 ENCOUNTER — Encounter: Payer: Self-pay | Admitting: Cardiology

## 2023-08-18 VITALS — BP 140/56 | HR 60 | Ht 60.0 in | Wt 132.6 lb

## 2023-08-18 DIAGNOSIS — I10 Essential (primary) hypertension: Secondary | ICD-10-CM

## 2023-08-18 DIAGNOSIS — I6523 Occlusion and stenosis of bilateral carotid arteries: Secondary | ICD-10-CM

## 2023-08-18 DIAGNOSIS — R600 Localized edema: Secondary | ICD-10-CM

## 2023-08-18 DIAGNOSIS — I5032 Chronic diastolic (congestive) heart failure: Secondary | ICD-10-CM | POA: Diagnosis not present

## 2023-08-18 DIAGNOSIS — R001 Bradycardia, unspecified: Secondary | ICD-10-CM

## 2023-08-18 DIAGNOSIS — E785 Hyperlipidemia, unspecified: Secondary | ICD-10-CM | POA: Diagnosis not present

## 2023-08-18 NOTE — Addendum Note (Signed)
 Addended by: Erick Alley on: 08/18/2023 11:02 AM   Modules accepted: Orders

## 2023-08-18 NOTE — Patient Instructions (Signed)
 Medication Instructions:  Your physician recommends that you continue on your current medications as directed. Please refer to the Current Medication list given to you today.  *If you need a refill on your cardiac medications before your next appointment, please call your pharmacy*  Lab Work: FLP and CMET today If you have labs (blood work) drawn today and your tests are completely normal, you will receive your results only by: MyChart Message (if you have MyChart) OR A paper copy in the mail If you have any lab test that is abnormal or we need to change your treatment, we will call you to review the results.  Testing/Procedures: NONE  Follow-Up: At [facility], you and your health needs are our priority.  As part of our continuing mission to provide you with exceptional heart care, our providers are all part of one team.  This team includes your primary Cardiologist (physician) and Advanced Practice Providers or APPs (Physician Assistants and Nurse Practitioners) who all work together to provide you with the care you need, when you need it.  Your next appointment:   1 year(s)  Provider:   Armanda Magic, MD     We recommend signing up for the patient portal called "MyChart".  Sign up information is provided on this After Visit Summary.  MyChart is used to connect with patients for Virtual Visits (Telemedicine).  Patients are able to view lab/test results, encounter notes, upcoming appointments, etc.  Non-urgent messages can be sent to your provider as well.   To learn more about what you can do with MyChart, go to [email or web address].   Other Instructions       1st Floor: - Lobby - Registration  - Pharmacy  - Lab - Cafe  2nd Floor: - PV Lab - Diagnostic Testing (echo, CT, nuclear med)  3rd Floor: - Vacant  4th Floor: - TCTS (cardiothoracic surgery) - AFib Clinic - Structural Heart Clinic - Vascular Surgery  - Vascular Ultrasound  5th Floor: -  HeartCare Cardiology (general and EP) - Clinical Pharmacy for coumadin, hypertension, lipid, weight-loss medications, and med management appointments    Valet parking services will be available as well.

## 2023-08-18 NOTE — Progress Notes (Addendum)
 Cardiology Office Note:    Date:  08/18/2023   ID:  Kimberly [name], DOB 1940/12/31, MRN [id number]  PCP:  Inez Pilgrim, NP  Cardiologist:  Armanda Magic, MD    Referring MD: Inez Pilgrim, NP   Chief Complaint  Patient presents with   Hyperlipidemia   Hypertension   Congestive Heart Failure     History of Present Illness:    Kimberly [name] is a 83 y.o. female with a hx of chronic diastolic CHF, chronic lower extremity edema and hypertension.  She had a cath in 2010 due to CP showing normal coronary arteries with elevated LVEDP  felt to be due to diastolic CHF vs. Microvascular angina. Her CP resolved for a while but reoccurred and nuclear stress test 09/2014 was normal.  2D echocardiogram 09/2014 showed normal LV function with EF 55 to 60% and diastolic dysfunction.  She was admitted February 2019 with junctional bradycardia, chest pain and shortness of breath.  Her heart rate was in the 40s.  Chest pressure was felt to be due to symptomatic junctional bradycardia.  Her clonidine and metoprolol were stopped.  2D echocardiogram at that time showed EF 55 to 60%.  Amlodipine was added for blood pressure control.  She was seen back in the office on 06/23/2017 her heart rate was 68 bpm.  Her weakness resolved.  She was trialed on hydralazine in the past but developed headache/dizziness after taking hydralazine so this was stopped. She was restarted on lower dose of amlodipine with titration of losartan to 50mg  daily. She has been on varying doses of amlodipine over the years as well as lisinopril, HCTZ, and chlorthalidone at one point. Amlodipine had to be reduced to 5mg  daily due to lower extremity edema. She had rise in creatinine with chlorthalidone 25mg  daily so this was discontinued.  She was seen 02/2019 by [name], PA for followup and had a lot of psychosocial issues.  Her BP was poorly controlled at 190/93mmHg and her Losartan was increased to 100mg  daily.  Amlodipine was not titrated  due to problems with LE edema in the past.  It was felt that uncontrolled depression and anxiety could also be playing a role in her BP elevations.  She was also felt to have depression and was referred back to her PCP to address her depression and antihypertensive meds so there were not multiple MDs making changes to her meds and causing confusion.   She is here today for followup and is doing well.  Since I saw her last she has had a few falls and one ended her in the hospital and her Lasix ws stopped and then had to go to SNF for a month.  Her Losartan was stopped due to AKI on CKD. She was placed back on the Lasix 40mg  daily due to worsening LE edema.   She denies any chest pain or pressure, SOB, DOE, PND, orthopnea,dizziness, palpitations or syncope. Since going off Lasix she has had more swelling in her LE.  She is compliant with her meds and is tolerating meds with no SE.      Past Medical History:  Diagnosis Date   Acid reflux    Anxiety    Arthritis    Back spasm    Bradycardia    a. junctional and sinus brady during admission 06/2017 requiring cessation of beta blocker and clonidine.   Carotid artery stenosis    Carotid Dopplers 07/2021 showed 40 to 59% right ICA stenosis and 1 to 39%  left ICA stenosis.  The right subclavian artery flow is disturbed.   Chronic diastolic heart failure (HCC) 03/20/2015   Chronic edema    CKD (chronic kidney disease), stage III (HCC)    Depression    Diabetes mellitus without complication (HCC)    diet controled   Fuch's endothelial dystrophy    bilateral eyes   Headache    occ   HOH (hard of hearing)    wears hearing aid in left ear   Hypercholesteremia    Hypertensive heart disease with CHF (HCC) 09/11/2014   Insomnia    Junctional bradycardia    Mild mitral regurgitation    Mild tricuspid regurgitation    Normal coronary arteries 2010   a. 2010 cath with no obstructive epicardial disease.  Done for CP which was felt to be due to  microvascular angina or diastolic HF.   OSA on CPAP    Followed by Dr. Fannie Knee   Patient's other noncompliance with medication regimen    per notes, gets confused easily with medications, difficult compliance     Past Surgical History:  Procedure Laterality Date   ABDOMINAL HYSTERECTOMY     APPENDECTOMY     CARDIAC CATHETERIZATION     no CAD   CESAREAN SECTION     x 2   CHOLECYSTECTOMY     COLONOSCOPY     EYE SURGERY Right    implant   ROTATOR CUFF REPAIR Left    SHOULDER ARTHROSCOPY WITH SUBACROMIAL DECOMPRESSION AND BICEP TENDON REPAIR Left 08/24/2012   Procedure: SHOULDER ARTHROSCOPY WITH SUBACROMIAL DECOMPRESSION AND BICEP TENDON REPAIR;  Surgeon: Cammy Copa, MD;  Location: [facility] OR;  Service: Orthopedics;  Laterality: Left;  Left Shoulder Arthroscopy, Debridement, Biceps Tenotomy   THYROIDECTOMY N/A 06/12/2015   Procedure: TOTAL THYROIDECTOMY;  Surgeon: Darnell Level, MD;  Location: [facility];  Service: General;  Laterality: N/A;    Current Medications: Current Meds  Medication Sig   acetaminophen (TYLENOL) 500 MG tablet Take 1,000 mg by mouth every 6 (six) hours as needed for mild pain (pain score 1-3) or moderate pain (pain score 4-6).   albuterol (VENTOLIN HFA) 108 (90 Base) MCG/ACT inhaler Inhale 1 puff into the lungs every 4 (four) hours as needed for wheezing or shortness of breath.   amLODipine (NORVASC) 10 MG tablet Take 1 tablet (10 mg total) by mouth daily.   aspirin 81 MG chewable tablet Chew 81 mg by mouth daily.   atorvastatin (LIPITOR) 40 MG tablet Take 1 tablet (40 mg total) by mouth daily.   Cholecalciferol (VITAMIN D3) 5000 units CAPS Take 5,000 Units by mouth daily.   cloNIDine (CATAPRES - DOSED IN MG/24 HR) 0.2 mg/24hr patch Place 1 patch (0.2 mg total) onto the skin once a week.   Fluticasone Propionate, Inhal, 100 MCG/ACT AEPB Inhale 1 puff into the lungs in the morning and at bedtime.   hydrALAZINE (APRESOLINE) 100 MG tablet Take 1 tablet (100 mg  total) by mouth 3 (three) times daily. (Patient taking differently: Take 100 mg by mouth daily.)   isosorbide mononitrate (IMDUR) 60 MG 24 hr tablet Take 0.5 tablets (30 mg total) by mouth daily.   LASIX 40 MG tablet Take 40 mg by mouth daily.   levothyroxine (SYNTHROID) 137 MCG tablet TAKE 1 TABLET BY MOUTH ONCE DAILY BEFORE BREAKFAST . APPOINTMENT REQUIRED FOR FUTURE REFILLS   nitroGLYCERIN (NITROSTAT) 0.4 MG SL tablet Place 1 tablet (0.4 mg total) under the tongue every 5 (five) minutes as needed for  chest pain.   ondansetron (ZOFRAN) 4 MG tablet Take 1 tablet (4 mg total) by mouth every 8 (eight) hours as needed for nausea or vomiting.   PARoxetine (PAXIL) 20 MG tablet Take 20 mg by mouth daily.     Allergies:   Oxycodone-acetaminophen, Percocet [oxycodone-acetaminophen], Codeine, Other, Promethazine hcl, Clonidine derivatives, Hydralazine, Clonidine, and Metoprolol   Social History   Socioeconomic History   Marital status: Married    Spouse name: Not on file   Number of children: 2   Years of education: Not on file   Highest education level: Not on file  Occupational History   Occupation: Radiographer, therapeutic  Tobacco Use   Smoking status: Never   Smokeless tobacco: Never  Vaping Use   Vaping status: Never Used  Substance and Sexual Activity   Alcohol use: No    Alcohol/week: 0.0 standard drinks of alcohol   Drug use: No   Sexual activity: Not on file  Other Topics Concern   Not on file  Social History Narrative   Not on file   Social Drivers of Health   Financial Resource Strain: Not on file  Food Insecurity: No Food Insecurity (04/04/2023)   Hunger Vital Sign    Worried About Running Out of Food in the Last Year: Never true    Ran Out of Food in the Last Year: Never true  Transportation Needs: No Transportation Needs (04/04/2023)   PRAPARE - Administrator, Civil Service (Medical): No    Lack of Transportation (Non-Medical): No  Physical Activity:  Not on file  Stress: Not on file  Social Connections: Not on file     Family History: The patient's family history includes Cancer in her sister; Stomach cancer in her mother.  ROS:   Please see the history of present illness.    ROS  All other systems reviewed and negative.   EKGs/Labs/Other Studies Reviewed:    The following studies were reviewed today:  2D echo 01/2020 IMPRESSIONS   1. Left ventricular ejection fraction, by estimation, is 60 to 65%. The  left ventricle has normal function. The left ventricle has no regional  wall motion abnormalities. Left ventricular diastolic parameters are  consistent with Grade II diastolic  dysfunction (pseudonormalization). Elevated left ventricular end-diastolic  pressure. The E/e' is 26.   2. Right ventricular systolic function is normal. The right ventricular  size is normal. There is mildly elevated pulmonary artery systolic  pressure. The estimated right ventricular systolic pressure is 44.0 mmHg.   3. Left atrial size was moderately dilated.   4. Right atrial size was moderately dilated.   5. The mitral valve is normal in structure. Trivial mitral valve  regurgitation. No evidence of mitral stenosis.   6. The aortic valve is normal in structure. Aortic valve regurgitation is  not visualized. No aortic stenosis is present.   7. The inferior vena cava is normal in size with greater than 50%  respiratory variability, suggesting right atrial pressure of 3 mmHg.   Steffanie Dunn 01/2020 Study Highlights    Nuclear stress EF: 65%. Horizontal ST segment depression 2mm ST segment depression was noted during stress in the I and II leads. ST deviation beginning in recovery. Lead I has T wave inversions at baseline that persisted throughout. This is a low risk study. The left ventricular ejection fraction is normal (55-65%).   No ischemia or infarction on perfusion images. Normal wall motion.    Recent Labs: 01/18/2023: B  Natriuretic Peptide  230.6 04/01/2023: TSH 7.391 04/04/2023: ALT 19 04/08/2023: BUN 50; Creatinine, Ser 1.81; Hemoglobin 7.9; Magnesium 1.8; Platelets 170; Potassium 4.0; Sodium 138   Recent Lipid Panel    Component Value Date/Time   CHOL 162 10/02/2021 0833   TRIG 107 10/02/2021 0833   HDL 47 10/02/2021 0833   CHOLHDL 3.4 10/02/2021 0833   CHOLHDL 5.1 (H) 10/02/2015 0901   VLDL 34 (H) 10/02/2015 0901   LDLCALC 96 10/02/2021 0833    Physical Exam:    VS:  BP (!) 140/56   Pulse 60   Ht 5' (1.524 m)   Wt 132 lb 9.6 oz (60.1 kg)   SpO2 98%   BMI 25.90 kg/m     Wt Readings from Last 3 Encounters:  08/18/23 132 lb 9.6 oz (60.1 kg)  04/10/23 137 lb 9.1 oz (62.4 kg)  04/02/23 140 lb 9.6 oz (63.8 kg)    GEN: Well nourished, well developed in no acute distress HEENT: Normal NECK: No JVD; No carotid bruits LYMPHATICS: No lymphadenopathy CARDIAC:RRR, no murmurs, rubs, gallops RESPIRATORY:  Clear to auscultation without rales, wheezing or rhonchi  ABDOMEN: Soft, non-tender, non-distended MUSCULOSKELETAL:  No edema; No deformity  SKIN: Warm and dry NEUROLOGIC:  Alert and oriented x 3 PSYCHIATRIC:  Normal affect  ASSESSMENT:    1. Essential hypertension   2. Chronic heart failure with preserved ejection fraction (HCC)   3. Junctional bradycardia   4. Bilateral carotid artery stenosis   5. Hyperlipidemia LDL goal <70   6. Leg edema     PLAN:    In order of problems listed above:  1.  HTN -she was taken off BB due to bradycardia -ARB was stopped due to CKD -BP controlled on exam today -continue prescription drug management with Amlodipine 10mg  daily, clonidine patch 0.2 mg weekly, Imdur 30 mg daily and hydralazine 100mg  TID with PRN refills  2.  Chronic diastolic CHF -she appears euvolemic on exam today with no SOB but has chronic LE edema from chronic venous stasis and sedentary state -she is now back on Lasix 40mg  daily -repeat BMET today  3.  Junctional  Bradycardia -resolved off BB  4.  Carotid artery stenosis -Carotid Dopplers 04/02/2023 showed 1 to 39% bilateral carotid artery stenosis -Continue aspirin 81 mg daily and atorvastatin  5.  Hyperlipidemia -LDL  goal less than 70 -Continue prescription drug management with atorvastatin 40 mg daily with as needed refills -I have personally reviewed and interpreted outside labs performed by patient's PCP which showed LDL 74, HDL 42 on 09/10/22 -check FLP and ALT  6.  Chronic LE edema -multifactorial related to chronic venous stasis, sedentary state and amlodipine -unfortunately we are limited in treatment of her BP due to hx of bradycardia and CKD so amlodipine needs to be kept at high dose Continue wearing her compression hose  Medication Adjustments/Labs and Tests Ordered: Current medicines are reviewed at length with the patient today.  Concerns regarding medicines are outlined above.  No orders of the defined types were placed in this encounter.   No orders of the defined types were placed in this encounter.    Signed, Armanda Magic, MD  08/18/2023 10:57 AM    [facility]

## 2023-08-19 ENCOUNTER — Telehealth: Payer: Self-pay

## 2023-08-19 DIAGNOSIS — Z79899 Other long term (current) drug therapy: Secondary | ICD-10-CM

## 2023-08-19 LAB — LIPID PANEL
Chol/HDL Ratio: 2.6 ratio (ref 0.0–4.4)
Cholesterol, Total: 154 mg/dL (ref 100–199)
HDL: 60 mg/dL (ref >39)
LDL Chol Calc (NIH): 79 mg/dL (ref 0–99)
Triglycerides: 76 mg/dL (ref 0–149)
VLDL Cholesterol Cal: 15 mg/dL (ref 5–40)

## 2023-08-19 LAB — COMPREHENSIVE METABOLIC PANEL WITH GFR
ALT: 15 IU/L (ref 0–32)
AST: 25 IU/L (ref 0–40)
Albumin: 4.6 g/dL (ref 3.7–4.7)
Alkaline Phosphatase: 57 IU/L (ref 44–121)
BUN/Creatinine Ratio: 28 (ref 12–28)
BUN: 43 mg/dL — ABNORMAL HIGH (ref 8–27)
Bilirubin Total: 0.7 mg/dL (ref 0.0–1.2)
CO2: 22 mmol/L (ref 20–29)
Calcium: 9.6 mg/dL (ref 8.7–10.3)
Chloride: 104 mmol/L (ref 96–106)
Creatinine, Ser: 1.56 mg/dL — ABNORMAL HIGH (ref 0.57–1.00)
Globulin, Total: 1.9 g/dL (ref 1.5–4.5)
Glucose: 99 mg/dL (ref 70–99)
Potassium: 4.2 mmol/L (ref 3.5–5.2)
Sodium: 144 mmol/L (ref 134–144)
Total Protein: 6.5 g/dL (ref 6.0–8.5)
eGFR: 33 mL/min/{1.73_m2} — ABNORMAL LOW (ref >59)

## 2023-08-19 NOTE — Telephone Encounter (Signed)
 The patient has been notified of the result and verbalized understanding. A BMET was ordered and released. Pt aware to go to [facility] in 1 week.  All questions (if any) were answered. Erick Alley, RN 08/19/2023 10:39 AM

## 2023-08-19 NOTE — Telephone Encounter (Signed)
-----   Message from Armanda Magic sent at 08/19/2023  9:32 AM EDT ----- SCr still bumped - recommend decreasing Lasix to 40mg  every other day alternating with 20mg  every other day and repeat BMET in 1 week

## 2023-08-26 ENCOUNTER — Ambulatory Visit: Payer: Medicare PPO | Admitting: Internal Medicine

## 2023-08-26 NOTE — Progress Notes (Deleted)
 Patient ID: SANVI EHLER, female   DOB: 05-30-1940, 83 y.o.   MRN: [id number]  HPI  Kimberly [name] is a 83 y.o.-year-old female, returning for f/u for hypercalcemia, dx 2012, postsurgical hypothyroidism. Last visit was 1 year ago.  She is limited in coming for the appointments by the need for transportation.  Her daughter brings her.   Interim history: In 07/2019, she was admitted with encephalopathy either related to UTI or hypertensive.  Since last visit, she had another admission with pneumonia 01/2023 and then with hypertensive emergency with encephalopathy (260/90) in 03/2023. She has back and hip pain.  Postsurgical hypothyroidism: - She had thyroidectomy for substernal goiter in 05/2015: Benign nodule pathology  Reviewed and addended history: She had problems with compliance with her levothyroxine in the past.  In 06/2017 he was hospitalized and was found to have a junctional bradycardia with a TSH of 42.  At that time, daughter reported that she was not taking the levothyroxine consistently.  Afterwards, she started to put the medication bottle on her nightstand and was consistently taking it daily.  However, she then had a TSH level that was high, at 15.  TSH remained high so we increased the dose to 150 mcg daily in 07/2019.  She then had an increase in dose to 175 mcg daily but we were then able to reduce the dose gradually.  She is currently taking 137 mcg LT4 daily: - in am - fasting - at least 30 min from b'fast - no calcium  - no iron - no multivitamins - no PPIs - not on Biotin  Reviewed her TFTs: Lab Results  Component Value Date   TSH 7.391 (H) 04/01/2023   TSH 3.414 01/20/2023   TSH 108.08 (H) 08/27/2022   TSH 5.44 08/21/2021   TSH 0.05 (L) 06/27/2021   TSH 0.10 (L) 05/14/2021   TSH 1.14 09/28/2020   TSH 1.21 03/29/2020   TSH 3.96 09/22/2019   TSH 5.89 (H) 07/20/2019   FREET4 0.71 08/27/2022   FREET4 1.09 08/21/2021   FREET4 2.19 (H) 06/27/2021   FREET4  1.52 05/14/2021   FREET4 1.38 09/28/2020   FREET4 1.53 03/29/2020   FREET4 1.25 09/22/2019   FREET4 1.18 07/20/2019   FREET4 1.23 04/13/2019   FREET4 0.98 06/14/2018  07/31/2015: TSH 58  Surprisingly, her calcium has improved after parathyroid surgery in 2016  Primary hyperparathyroidism: -Greatly improved from 2016  Reviewed recent labs: 03/25/2021: Corrected calcium 9.9 Lab Results  Component Value Date   PTH 19 09/18/2015   PTH Comment 09/18/2015   PTH 21 02/08/2015   PTH Comment 02/08/2015   PTH 17 12/09/2013   PTH Comment 12/09/2013   PTH 14.9 08/24/2012   CALCIUM 9.6 08/18/2023   CALCIUM 8.8 (L) 04/08/2023   CALCIUM 8.9 04/07/2023   CALCIUM 8.8 (L) 04/06/2023   CALCIUM 8.9 04/05/2023   CALCIUM 9.0 04/04/2023   CALCIUM 9.3 04/02/2023   CALCIUM 9.8 04/01/2023   CALCIUM 9.3 03/31/2023   CALCIUM 9.7 03/30/2023  12/25/2014: Calcium 10.8 (8.6-10.3) 09/12/2013: Ca 10.8, iCa 5.7 (4.5-5.6)  Has a history of vitamin D deficiency: Lab Results  Component Value Date   VD25OH 66.30 08/27/2022   VD25OH 47.58 05/14/2021   VD25OH 27.9 (L) 09/28/2020   VD25OH 34.0 03/29/2020   VD25OH 34.49 07/20/2019   VD25OH 18.27 (L) 04/13/2019   VD25OH 57.47 06/14/2018   VD25OH 61.83 09/24/2017   VD25OH 32.67 09/18/2015   VD25OH 28.65 (L) 02/08/2015   She continues on vitamin D  5000 units daily.  Reviewed other pertinent labs: Component     Latest Ref Rng 02/08/2015 02/08/2015         2:14 PM  2:14 PM  Vitamin A (Retinoic Acid)     38 - 98 mcg/dL 89    Component     Latest Ref Rng 12/09/2013  Vitamin D 1, 25 (OH) Total     18 - 72 pg/mL 79 (H)  Vitamin D3 1, 25 (OH)      79  Vitamin D2 1, 25 (OH)      <8  PTH-Related Protein (PTH-RP)     14 - 27 pg/mL 16  Phosphorus     2.3 - 4.6 mg/dL 3.2   Her urine calcium was elevated: Component     Latest Ref Rng 01/20/2014  Calcium, Ur      14  Calcium, 24 hour urine     100 - 250 mg/day 322 (H)  Creatinine, Urine      51.9   Creatinine, 24H Ur     700 - 1800 mg/day 1194   No history of kidney stones. No history of osteoporosis.  No fractures.  Reviewed her DXA scan report from 2008:  LUMBAR SPINE (L1-L3)  BMD: 1.273  T-score (% of young adult value): 2.3  Z-score (% adult age match value): 4.1  LEFT HIP (NECK)  BMD: 0.831  T-score (% of young adult value): -0.2  Z-score (% adult age match value): 1.4  Assessment: This patient is considered normal according to World Health Organization [facility]) criteria.   She has CKD (Dr. Ansel Kingdom).  Latest BUN/creatinine reviewed: Lab Results  Component Value Date   BUN 43 (H) 08/18/2023   CREATININE 1.56 (H) 08/18/2023    She has had a technetium sestamibi scan (04/2015) that was negative for a parathyroid adenoma e and a CT scan of her neck (04/2015) there was also negative for possible parathyroid nodule nodule but this showed a substernal goiter with tracheal narrowing.  She also has a history of DM2 (managed by PCP) - HbA1c 4.9% on 03/25/2021, HTN, HL.  ROS: + see HPI  I reviewed pt's medications, allergies, PMH, social hx, family hx, and changes were documented in the history of present illness. Otherwise, unchanged from my initial visit note.  Past Medical History:  Diagnosis Date   Acid reflux    Anxiety    Arthritis    Back spasm    Bradycardia    a. junctional and sinus brady during admission 06/2017 requiring cessation of beta blocker and clonidine.   Carotid artery stenosis    Carotid Dopplers 07/2021 showed 40 to 59% right ICA stenosis and 1 to 39% left ICA stenosis.  The right subclavian artery flow is disturbed.   Chronic diastolic heart failure (HCC) 03/20/2015   Chronic edema    CKD (chronic kidney disease), stage III (HCC)    Depression    Diabetes mellitus without complication (HCC)    diet controled   Fuch's endothelial dystrophy    bilateral eyes   Headache    occ   HOH (hard of hearing)    wears hearing aid in left ear    Hypercholesteremia    Hypertensive heart disease with CHF (HCC) 09/11/2014   Insomnia    Junctional bradycardia    Mild mitral regurgitation    Mild tricuspid regurgitation    Normal coronary arteries 2010   a. 2010 cath with no obstructive epicardial disease.  Done for CP which was felt  to be due to microvascular angina or diastolic HF.   OSA on CPAP    Followed by Dr. Fannie Knee   Patient's other noncompliance with medication regimen    per notes, gets confused easily with medications, difficult compliance    Past Surgical History:  Procedure Laterality Date   ABDOMINAL HYSTERECTOMY     APPENDECTOMY     CARDIAC CATHETERIZATION     no CAD   CESAREAN SECTION     x 2   CHOLECYSTECTOMY     COLONOSCOPY     EYE SURGERY Right    implant   ROTATOR CUFF REPAIR Left    SHOULDER ARTHROSCOPY WITH SUBACROMIAL DECOMPRESSION AND BICEP TENDON REPAIR Left 08/24/2012   Procedure: SHOULDER ARTHROSCOPY WITH SUBACROMIAL DECOMPRESSION AND BICEP TENDON REPAIR;  Surgeon: Cammy Copa, MD;  Location: [facility] OR;  Service: Orthopedics;  Laterality: Left;  Left Shoulder Arthroscopy, Debridement, Biceps Tenotomy   THYROIDECTOMY N/A 06/12/2015   Procedure: TOTAL THYROIDECTOMY;  Surgeon: Darnell Level, MD;  Location: [facility] OR;  Service: General;  Laterality: N/A;   History   Social History   Marital Status: Married    Spouse Name: N/A    Number of Children: 2   Occupational History   Radiographer, therapeutic    Social History Main Topics   Smoking status: Never Smoker    Smokeless tobacco: Not on file   Alcohol Use: No   Drug Use: No   Current Outpatient Medications on File Prior to Visit  Medication Sig Dispense Refill   acetaminophen (TYLENOL) 500 MG tablet Take 1,000 mg by mouth every 6 (six) hours as needed for mild pain (pain score 1-3) or moderate pain (pain score 4-6).     albuterol (VENTOLIN HFA) 108 (90 Base) MCG/ACT inhaler Inhale 1 puff into the lungs every 4 (four) hours as needed for  wheezing or shortness of breath.     amLODipine (NORVASC) 10 MG tablet Take 1 tablet (10 mg total) by mouth daily. 90 tablet 2   aspirin 81 MG chewable tablet Chew 81 mg by mouth daily.     atorvastatin (LIPITOR) 40 MG tablet Take 1 tablet (40 mg total) by mouth daily. 90 tablet 2   Cholecalciferol (VITAMIN D3) 5000 units CAPS Take 5,000 Units by mouth daily.     cloNIDine (CATAPRES - DOSED IN MG/24 HR) 0.2 mg/24hr patch Place 1 patch (0.2 mg total) onto the skin once a week. 4 patch 12   Fluticasone Propionate, Inhal, 100 MCG/ACT AEPB Inhale 1 puff into the lungs in the morning and at bedtime.     hydrALAZINE (APRESOLINE) 100 MG tablet Take 1 tablet (100 mg total) by mouth 3 (three) times daily. (Patient taking differently: Take 100 mg by mouth daily.) 90 tablet 2   isosorbide mononitrate (IMDUR) 60 MG 24 hr tablet Take 0.5 tablets (30 mg total) by mouth daily.     LASIX 40 MG tablet Take 40 mg by mouth daily.     levothyroxine (SYNTHROID) 137 MCG tablet TAKE 1 TABLET BY MOUTH ONCE DAILY BEFORE BREAKFAST . APPOINTMENT REQUIRED FOR FUTURE REFILLS 60 tablet 0   nitroGLYCERIN (NITROSTAT) 0.4 MG SL tablet Place 1 tablet (0.4 mg total) under the tongue every 5 (five) minutes as needed for chest pain. 25 tablet 11   ondansetron (ZOFRAN) 4 MG tablet Take 1 tablet (4 mg total) by mouth every 8 (eight) hours as needed for nausea or vomiting. 20 tablet 0   PARoxetine (PAXIL) 20 MG tablet Take 20 mg  by mouth daily.     No current facility-administered medications on file prior to visit.   Allergies  Allergen Reactions   Oxycodone-Acetaminophen Shortness Of Breath, Itching and Other (See Comments)    Pt couldn't breathe or catch her breath   Percocet [Oxycodone-Acetaminophen] Shortness Of Breath and Other (See Comments)    Pt couldn't breathe or catch her breath   Codeine Itching and Other (See Comments)    hyperactivity    Other Other (See Comments)    Some pain medication given at [facility]  for surgery caused hallucinations    Promethazine Hcl Nausea And Vomiting and Other (See Comments)    Hallucinations     Clonidine Derivatives Other (See Comments)    Bradycardia (06/2017)   Hydralazine Other (See Comments)    Headache/dizzy    Clonidine Palpitations    Bradycardia (06/2017)   Metoprolol Palpitations    Bradycardia 06/2017    Family History  Problem Relation Age of Onset   Stomach cancer Mother    Cancer Sister        intestinal   PE: There were no vitals taken for this visit.  Wt Readings from Last 3 Encounters:  08/18/23 132 lb 9.6 oz (60.1 kg)  04/10/23 137 lb 9.1 oz (62.4 kg)  04/02/23 140 lb 9.6 oz (63.8 kg)   Constitutional: overweight, in NAD Eyes: EOMI, no exophthalmos ENT: no thyromegaly, no cervical lymphadenopathy Cardiovascular: RRR, No RG, + 2/6 SEM, + B LE pitting edema Respiratory: CTA B Musculoskeletal: no deformities Skin:  no rashes Neurological: no tremor with outstretched hands  Assessment: 1. Postsurgical hypothyroidism  2. H/o Hypercalcemia - ? primary hyperparathyroidism  3.  Vitamin D Insufficiency  Plan: 1. Postsurgical hypothyroidism - complicated by medication noncompliance -She had a high TSH in 02/2019: 15.7.  At that time she was not taking the levothyroxine correctly and missing doses.  She was taking calcium and multivitamins close to levothyroxine and we moved them later in the day.  However, TSH returned high when it was checked during her admission for encephalopathy in 07/2019.  We increased her levothyroxine dose at that time. Afterwards, we gradually decrease the dose, most likely due to weight loss.  At last visit, however, her TSH was extremely high, at 108.  She most likely was not taking the levothyroxine at all.  Afterwards, she started to take it so the last TSH was much improved, only slightly above goal: Lab Results  Component Value Date   TSH 7.391 (H) 04/01/2023  - she continues on 137 mcg daily - she  feels good on this dose - we discussed about taking the thyroid hormone every day, with water, >30 minutes before breakfast, separated by >4 hours from acid reflux medications, calcium, iron, multivitamins. Pt. is taking it correctly. - will check thyroid tests today: TSH and fT4 - If labs are abnormal, she will need to return for repeat TFTs in 1.5 months - OTW, I will see her back in 6 months  2. Hypercalcemia -Interestingly, her calcium normalized after her thyroid surgery in 2016 -Reviewed previous investigation: Patient has had several instances of elevated calcium, with the highest being at 11.1.  Of note, her PTH levels were repeatedly checked and they were normal.  A PTHrp level was normal, multiple myeloma work-up was negative, vitamin A level was normal, vitamin D was normal or minimally low, she had a high 1, 25 dihydroxy vitamin D with a normal phosphorus.  Her urinary calcium was high in  the past.  A technetium sestamibi scan, thyroid ultrasound, and neck CT did not show any parathyroid masses. -No apparent complications from hypercalcemia: No nephrolithiasis, abdominal pain - Latest calcium level was normal, at 9.6, on 08/18/2023  3.  Vitamin D insufficiency - She continues on vitamin D 5000 units daily - Latest vitamin D level was normal 08/2022 - Will repeat this today  Emilie Harden, MD PhD [facility]

## 2023-09-01 DIAGNOSIS — Z79899 Other long term (current) drug therapy: Secondary | ICD-10-CM | POA: Diagnosis not present

## 2023-09-02 ENCOUNTER — Encounter: Payer: Self-pay | Admitting: Internal Medicine

## 2023-09-02 ENCOUNTER — Ambulatory Visit: Admitting: Internal Medicine

## 2023-09-02 VITALS — BP 124/80 | HR 58 | Ht 60.0 in | Wt 130.6 lb

## 2023-09-02 DIAGNOSIS — E559 Vitamin D deficiency, unspecified: Secondary | ICD-10-CM

## 2023-09-02 DIAGNOSIS — E890 Postprocedural hypothyroidism: Secondary | ICD-10-CM | POA: Diagnosis not present

## 2023-09-02 LAB — BASIC METABOLIC PANEL WITH GFR
BUN/Creatinine Ratio: 28 (ref 12–28)
BUN: 52 mg/dL — ABNORMAL HIGH (ref 8–27)
CO2: 22 mmol/L (ref 20–29)
Calcium: 9.6 mg/dL (ref 8.7–10.3)
Chloride: 101 mmol/L (ref 96–106)
Creatinine, Ser: 1.84 mg/dL — ABNORMAL HIGH (ref 0.57–1.00)
Glucose: 154 mg/dL — ABNORMAL HIGH (ref 70–99)
Potassium: 3.7 mmol/L (ref 3.5–5.2)
Sodium: 140 mmol/L (ref 134–144)
eGFR: 27 mL/min/{1.73_m2} — ABNORMAL LOW (ref >59)

## 2023-09-02 NOTE — Addendum Note (Signed)
 Addended by: Emilie Harden on: 09/02/2023 10:36 AM   Modules accepted: Orders

## 2023-09-02 NOTE — Progress Notes (Addendum)
 Patient ID: Kimberly [name], female   DOB: [birth year], 83 y.o.   MRN: [id number] This note was precharted 08/26/2023.  HPI  Kimberly [name] is a 83 y.o.-year-old female, returning for f/u for hypercalcemia, dx 2012, postsurgical hypothyroidism. Last visit was 1 year ago.  She is limited in coming for the appointments by the need for transportation.   At today's visit, she was in the hurry and forgot her hearing aids.  She is very hypoacusic.  Interim history: In 07/2019, she was admitted with encephalopathy either related to UTI or hypertension.  Since last visit, she had another admission with pneumonia 01/2023 and then with hypertensive emergency with encephalopathy (260/90) in 03/2023. She was in a SNF for rehab. She continues to have back and hip pain. She is not sleeping well.  Postsurgical hypothyroidism: - She had thyroidectomy for substernal goiter in 05/2015: Benign nodule pathology  Reviewed and addended history: She had problems with compliance with her levothyroxine  in the past.  In 06/2017 he was hospitalized and was found to have a junctional bradycardia with a TSH of 42.  At that time, daughter reported that she was not taking the levothyroxine  consistently.  Afterwards, she started to put the medication bottle on her nightstand and was consistently taking it daily.  However, she then had a TSH level that was high, at 15.  TSH remained high so we increased the dose to 150 mcg daily in 07/2019.  She then had an increase in dose to 175 mcg daily but we were then able to reduce the dose gradually.  She is currently taking 137 mcg LT4 daily: - in am - fasting - at least 30 min from b'fast - no calcium   - no iron - no multivitamins - no PPIs - not on Biotin  Reviewed her TFTs: Lab Results  Component Value Date   TSH 7.391 (H) 04/01/2023   TSH 3.414 01/20/2023   TSH 108.08 (H) 08/27/2022   TSH 5.44 08/21/2021   TSH 0.05 (L) 06/27/2021   TSH 0.10 (L) 05/14/2021   TSH 1.14  09/28/2020   TSH 1.21 03/29/2020   TSH 3.96 09/22/2019   TSH 5.89 (H) 07/20/2019   FREET4 0.71 08/27/2022   FREET4 1.09 08/21/2021   FREET4 2.19 (H) 06/27/2021   FREET4 1.52 05/14/2021   FREET4 1.38 09/28/2020   FREET4 1.53 03/29/2020   FREET4 1.25 09/22/2019   FREET4 1.18 07/20/2019   FREET4 1.23 04/13/2019   FREET4 0.98 06/14/2018  07/31/2015: TSH 58  Surprisingly, her calcium  has improved after parathyroid  surgery in 2016  Primary hyperparathyroidism: -Greatly improved from 2016  Reviewed recent labs: Lab Results  Component Value Date   PTH 19 09/18/2015   PTH Comment 09/18/2015   PTH 21 02/08/2015   PTH Comment 02/08/2015   PTH 17 12/09/2013   PTH Comment 12/09/2013   PTH 14.9 08/24/2012   CALCIUM  9.6 09/01/2023   CALCIUM  9.6 08/18/2023   CALCIUM  8.8 (L) 04/08/2023   CALCIUM  8.9 04/07/2023   CALCIUM  8.8 (L) 04/06/2023   CALCIUM  8.9 04/05/2023   CALCIUM  9.0 04/04/2023   CALCIUM  9.3 04/02/2023   CALCIUM  9.8 04/01/2023   CALCIUM  9.3 03/31/2023  03/25/2021: Corrected calcium  9.9 12/25/2014: Calcium  10.8 (8.6-10.3) 09/12/2013: Ca 10.8, iCa 5.7 (4.5-5.6)  Has a history of vitamin D  deficiency: Lab Results  Component Value Date   VD25OH 66.30 08/27/2022   VD25OH 47.58 05/14/2021   VD25OH 27.9 (L) 09/28/2020   VD25OH 34.0 03/29/2020   VD25OH 34.49 07/20/2019  VD25OH 18.27 (L) 04/13/2019   VD25OH 57.47 06/14/2018   VD25OH 61.83 09/24/2017   VD25OH 32.67 09/18/2015   VD25OH 28.65 (L) 02/08/2015   She continues on vitamin D , prev. 5000 units daily - now on gummies (x2 a day) - total 5000 units daily.  Reviewed other pertinent labs: Component     Latest Ref Rng 02/08/2015 02/08/2015         2:14 PM  2:14 PM  Vitamin A  (Retinoic Acid)     38 - 98 mcg/dL 89    Component     Latest Ref Rng 12/09/2013  Vitamin D  1, 25 (OH) Total     18 - 72 pg/mL 79 (H)  Vitamin D3 1, 25 (OH)      79  Vitamin D2 1, 25 (OH)      <8  PTH-Related Protein (PTH-RP)     14 -  27 pg/mL 16  Phosphorus     2.3 - 4.6 mg/dL 3.2   Her urine calcium  was elevated: Component     Latest Ref Rng 01/20/2014  Calcium , Ur      14  Calcium , 24 hour urine     100 - 250 mg/day 322 (H)  Creatinine, Urine      51.9  Creatinine, 24H Ur     700 - 1800 mg/day 1194   No history of kidney stones. No history of osteoporosis.  No fractures.  Reviewed her DXA scan report from 2008:  LUMBAR SPINE (L1-L3)  BMD: 1.273  T-score (% of young adult value): 2.3  Z-score (% adult age match value): 4.1  LEFT HIP (NECK)  BMD: 0.831  T-score (% of young adult value): -0.2  Z-score (% adult age match value): 1.4  Assessment: This patient is considered normal according to World Health Organization [facility]) criteria.   She has CKD (Dr. Ansel Kingdom).  Latest BUN/creatinine reviewed: Lab Results  Component Value Date   BUN 52 (H) 09/01/2023   CREATININE 1.84 (H) 09/01/2023    She has had a technetium sestamibi scan (04/2015) that was negative for a parathyroid  adenoma e and a CT scan of her neck (04/2015) there was also negative for possible parathyroid  nodule nodule but this showed a substernal goiter with tracheal narrowing.  She also has a history of DM2 (managed by PCP), HTN, HL.  ROS: + see HPI  I reviewed pt's medications, allergies, PMH, social hx, family hx, and changes were documented in the history of present illness. Otherwise, unchanged from my initial visit note.  Past Medical History:  Diagnosis Date   Acid reflux    Anxiety    Arthritis    Back spasm    Bradycardia    a. junctional and sinus brady during admission 06/2017 requiring cessation of beta blocker and clonidine .   Carotid artery stenosis    Carotid Dopplers 07/2021 showed 40 to 59% right ICA stenosis and 1 to 39% left ICA stenosis.  The right subclavian artery flow is disturbed.   Chronic diastolic heart failure (HCC) 03/20/2015   Chronic edema    CKD (chronic kidney disease), stage III (HCC)     Depression    Diabetes mellitus without complication (HCC)    diet controled   Fuch's endothelial dystrophy    bilateral eyes   Headache    occ   HOH (hard of hearing)    wears hearing aid in left ear   Hypercholesteremia    Hypertensive heart disease with CHF (HCC) 09/11/2014   Insomnia  Junctional bradycardia    Mild mitral regurgitation    Mild tricuspid regurgitation    Normal coronary arteries 2010   a. 2010 cath with no obstructive epicardial disease.  Done for CP which was felt to be due to microvascular angina or diastolic HF.   OSA on CPAP    Followed by Dr. Dianne Fortune   Patient's other noncompliance with medication regimen    per notes, gets confused easily with medications, difficult compliance    Past Surgical History:  Procedure Laterality Date   ABDOMINAL HYSTERECTOMY     APPENDECTOMY     CARDIAC CATHETERIZATION     no CAD   CESAREAN SECTION     x 2   CHOLECYSTECTOMY     COLONOSCOPY     EYE SURGERY Right    implant   ROTATOR CUFF REPAIR Left    SHOULDER ARTHROSCOPY WITH SUBACROMIAL DECOMPRESSION AND BICEP TENDON REPAIR Left 08/24/2012   Procedure: SHOULDER ARTHROSCOPY WITH SUBACROMIAL DECOMPRESSION AND BICEP TENDON REPAIR;  Surgeon: Jasmine Mesi, MD;  Location: [facility] OR;  Service: Orthopedics;  Laterality: Left;  Left Shoulder Arthroscopy, Debridement, Biceps Tenotomy   THYROIDECTOMY N/A 06/12/2015   Procedure: TOTAL THYROIDECTOMY;  Surgeon: Oralee Billow, MD;  Location: [facility] OR;  Service: General;  Laterality: N/A;   History   Social History   Marital Status: Married    Spouse Name: N/A    Number of Children: 2   Occupational History   Radiographer, therapeutic    Social History Main Topics   Smoking status: Never Smoker    Smokeless tobacco: Not on file   Alcohol Use: No   Drug Use: No   Current Outpatient Medications on File Prior to Visit  Medication Sig Dispense Refill   acetaminophen  (TYLENOL ) 500 MG tablet Take 1,000 mg by mouth every 6  (six) hours as needed for mild pain (pain score 1-3) or moderate pain (pain score 4-6).     albuterol  (VENTOLIN  HFA) 108 (90 Base) MCG/ACT inhaler Inhale 1 puff into the lungs every 4 (four) hours as needed for wheezing or shortness of breath.     amLODipine  (NORVASC ) 10 MG tablet Take 1 tablet (10 mg total) by mouth daily. 90 tablet 2   aspirin  81 MG chewable tablet Chew 81 mg by mouth daily.     atorvastatin  (LIPITOR ) 40 MG tablet Take 1 tablet (40 mg total) by mouth daily. 90 tablet 2   Cholecalciferol (VITAMIN D3) 5000 units CAPS Take 5,000 Units by mouth daily.     cloNIDine  (CATAPRES  - DOSED IN MG/24 HR) 0.2 mg/24hr patch Place 1 patch (0.2 mg total) onto the skin once a week. 4 patch 12   Fluticasone  Propionate, Inhal, 100 MCG/ACT AEPB Inhale 1 puff into the lungs in the morning and at bedtime.     hydrALAZINE  (APRESOLINE ) 100 MG tablet Take 1 tablet (100 mg total) by mouth 3 (three) times daily. (Patient taking differently: Take 100 mg by mouth daily.) 90 tablet 2   isosorbide  mononitrate (IMDUR ) 60 MG 24 hr tablet Take 0.5 tablets (30 mg total) by mouth daily.     LASIX  40 MG tablet Take 40 mg by mouth daily.     levothyroxine  (SYNTHROID ) 137 MCG tablet TAKE 1 TABLET BY MOUTH ONCE DAILY BEFORE BREAKFAST . APPOINTMENT REQUIRED FOR FUTURE REFILLS 60 tablet 0   nitroGLYCERIN  (NITROSTAT ) 0.4 MG SL tablet Place 1 tablet (0.4 mg total) under the tongue every 5 (five) minutes as needed for chest pain. 25 tablet 11  ondansetron  (ZOFRAN ) 4 MG tablet Take 1 tablet (4 mg total) by mouth every 8 (eight) hours as needed for nausea or vomiting. 20 tablet 0   PARoxetine  (PAXIL ) 20 MG tablet Take 20 mg by mouth daily.     No current facility-administered medications on file prior to visit.   Allergies  Allergen Reactions   Oxycodone-Acetaminophen  Shortness Of Breath, Itching and Other (See Comments)    Pt couldn't breathe or catch her breath   Percocet [Oxycodone-Acetaminophen ] Shortness Of Breath  and Other (See Comments)    Pt couldn't breathe or catch her breath   Codeine Itching and Other (See Comments)    hyperactivity    Other Other (See Comments)    Some pain medication given at [facility] for surgery caused hallucinations    Promethazine Hcl Nausea And Vomiting and Other (See Comments)    Hallucinations     Clonidine  Derivatives Other (See Comments)    Bradycardia (06/2017)   Hydralazine  Other (See Comments)    Headache/dizzy    Clonidine  Palpitations    Bradycardia (06/2017)   Metoprolol  Palpitations    Bradycardia 06/2017    Family History  Problem Relation Age of Onset   Stomach cancer Mother    Cancer Sister        intestinal   PE: BP 124/80   Pulse (!) 58   Ht 5' (1.524 m)   Wt 130 lb 9.6 oz (59.2 kg)   SpO2 96%   BMI 25.51 kg/m   Wt Readings from Last 10 Encounters:  09/02/23 130 lb 9.6 oz (59.2 kg)  08/18/23 132 lb 9.6 oz (60.1 kg)  04/10/23 137 lb 9.1 oz (62.4 kg)  04/02/23 140 lb 9.6 oz (63.8 kg)  03/17/23 134 lb 6.4 oz (61 kg)  01/20/23 134 lb (60.8 kg)  08/27/22 141 lb 9.6 oz (64.2 kg)  06/25/21 136 lb 12.8 oz (62.1 kg)  05/14/21 139 lb 3.2 oz (63.1 kg)  11/20/20 140 lb 3.2 oz (63.6 kg)   Constitutional: overweight, in NAD Eyes: EOMI, no exophthalmos ENT: no thyromegaly, no cervical lymphadenopathy Cardiovascular: RRR, No RG, + 2/6 SEM, + B LE pitting edema Respiratory: CTA B Musculoskeletal: no deformities Skin:  no rashes Neurological: no tremor with outstretched hands  Assessment: 1. Postsurgical hypothyroidism  2. H/o Hypercalcemia - ? primary hyperparathyroidism  3.  Vitamin D  Insufficiency  Plan: 1. Postsurgical hypothyroidism - complicated by medication noncompliance -She had a high TSH in 02/2019: 15.7.  At that time she was not taking the levothyroxine  correctly and missing doses.  She was taking calcium  and multivitamins close to levothyroxine  and we moved them later in the day.  However, TSH returned high when  it was checked during her admission for encephalopathy in 07/2019.  We increased her levothyroxine  dose at that time. Afterwards, we gradually decrease the dose, most likely due to weight loss.  At last visit, however, her TSH was extremely high, at 108.  She most likely was not taking the levothyroxine  at all.  Afterwards, she started to take it so the last TSH was much improved, only slightly above goal: Lab Results  Component Value Date   TSH 7.391 (H) 04/01/2023  - she continues on LT4 137 mcg daily - pt feels good on this dose.  She did lose 11 pounds since last visit. - we discussed about taking the thyroid  hormone every day, with water , >30 minutes before breakfast, separated by >4 hours from acid reflux medications, calcium , iron, multivitamins. Pt. is  taking it correctly. - will check thyroid  tests today: TSH and fT4 - If labs are abnormal, she will need to return for repeat TFTs in 1.5 months - OTW, I will see her back in 6 months   2. Hypercalcemia - Interestingly, her calcium  normalized after her thyroid  surgery in 2016 -Reviewed previous investigation: Patient has had several instances of elevated calcium , with the highest being at 11.1.  Of note, her PTH levels were repeatedly checked and they were normal.  A PTHrp level was normal, multiple myeloma work-up was negative, vitamin A  level was normal, vitamin D  was normal or minimally low, she had a high 1, 25 dihydroxy vitamin D  with a normal phosphorus.  Her urinary calcium  was high in the past.  A technetium sestamibi scan, thyroid  ultrasound, and neck CT did not show any parathyroid  masses. - Apparent complications from hypercalcemia: No nephrolithiasis, abdominal pain - Latest calcium  level was normal in 08/2023 (see HPI)  3.  Vitamin D  insufficiency - Continues vitamin D  5000 units daily - Latest vitamin D  level was normal in 08/2022 - We will repeat this today  Orders Placed This Encounter  Procedures   TSH   T4, free    Vitamin D , 25-hydroxy   Emilie Harden, MD PhD [facility] Endocrinology

## 2023-09-02 NOTE — Patient Instructions (Signed)
  Please continue levothyroxine 137 mcg daily.  Take the thyroid hormone every day, with water, at least 30 minutes before breakfast, separated by at least 4 hours from: - acid reflux medications - calcium - iron - multivitamins  Please continue vitamin D 5000 units daily.  Please stop at the lab.  Please come back for a follow-up appointment in 1 year.

## 2023-09-02 NOTE — Progress Notes (Signed)
 Left voice message to call back for Daughter (per DPR) 4/23

## 2023-09-03 ENCOUNTER — Telehealth: Payer: Self-pay

## 2023-09-03 ENCOUNTER — Encounter: Payer: Self-pay | Admitting: Internal Medicine

## 2023-09-03 DIAGNOSIS — Z79899 Other long term (current) drug therapy: Secondary | ICD-10-CM

## 2023-09-03 LAB — T4, FREE: Free T4: 1 ng/dL (ref 0.8–1.8)

## 2023-09-03 LAB — TSH: TSH: 7.73 m[IU]/L — ABNORMAL HIGH (ref 0.40–4.50)

## 2023-09-03 LAB — VITAMIN D 25 HYDROXY (VIT D DEFICIENCY, FRACTURES): Vit D, 25-Hydroxy: 64 ng/mL (ref 30–100)

## 2023-09-03 NOTE — Telephone Encounter (Signed)
-----   Message from Gaylyn Keas sent at 09/03/2023  9:27 AM EDT ----- Decrease Lasix  to 40mg  daily and check BMET in 1 week ----- Message ----- From: Isobel Marie, RN Sent: 09/02/2023   4:16 PM EDT To: Jacqueline Matsu, MD  Pt notified of results. Pt stated she has been following the Lasix  regiment of 40 mg every other day alternating with 20 mg every other day. Will forward to Dr. Micael Adas for suggestions. Pt verbalized understanding. All questions if any were answered.

## 2023-09-03 NOTE — Telephone Encounter (Signed)
 Spoke with pt's daughter (per DPR) regarding Dr. Charl Concha suggestions. Daughter aware to have pt take 40 mg of lasix  daily and to have a BMET in one week. BMET ordered and released. Daughter verbalized understanding. All questions if any were answered.

## 2023-09-03 NOTE — Addendum Note (Signed)
 Addended by: Emilie Harden on: 09/03/2023 10:48 AM   Modules accepted: Orders

## 2023-09-03 NOTE — Progress Notes (Signed)
 Patient notified of result.  Please refer to phone note from today for complete details.   Isobel Marie, RN 09/03/2023 1:46 PM

## 2023-09-10 DIAGNOSIS — Z79899 Other long term (current) drug therapy: Secondary | ICD-10-CM | POA: Diagnosis not present

## 2023-09-10 LAB — BASIC METABOLIC PANEL WITH GFR
BUN/Creatinine Ratio: 23 (ref 12–28)
BUN: 39 mg/dL — ABNORMAL HIGH (ref 8–27)
CO2: 20 mmol/L (ref 20–29)
Calcium: 9.5 mg/dL (ref 8.7–10.3)
Chloride: 101 mmol/L (ref 96–106)
Creatinine, Ser: 1.7 mg/dL — ABNORMAL HIGH (ref 0.57–1.00)
Glucose: 106 mg/dL — ABNORMAL HIGH (ref 70–99)
Potassium: 3.6 mmol/L (ref 3.5–5.2)
Sodium: 141 mmol/L (ref 134–144)
eGFR: 30 mL/min/{1.73_m2} — ABNORMAL LOW (ref >59)

## 2023-09-28 ENCOUNTER — Other Ambulatory Visit: Payer: Self-pay | Admitting: Internal Medicine

## 2023-09-28 DIAGNOSIS — E890 Postprocedural hypothyroidism: Secondary | ICD-10-CM

## 2023-10-26 ENCOUNTER — Telehealth: Payer: Self-pay | Admitting: Cardiology

## 2023-10-26 MED ORDER — HYDRALAZINE HCL 100 MG PO TABS: 100.0000 mg | ORAL_TABLET | Freq: Three times a day (TID) | ORAL | 11 refills | Status: AC

## 2023-10-26 NOTE — Telephone Encounter (Signed)
*  STAT* If patient is at the pharmacy, call can be transferred to refill team.   1. Which medications need to be refilled? (please list name of each medication and dose if known) hydrALAZINE  (APRESOLINE ) 100 MG tablet  2. Which pharmacy/location (including street and city if local pharmacy) is medication to be sent to?  [id number] - [place], [place] - [address]  3. Do they need a 30 day or 90 day supply?   30 day supply

## 2023-10-26 NOTE — Telephone Encounter (Signed)
 RX sent to requested Pharmacy

## 2023-11-23 DIAGNOSIS — Z0000 Encounter for general adult medical examination without abnormal findings: Secondary | ICD-10-CM | POA: Diagnosis not present

## 2023-11-23 DIAGNOSIS — N1832 Chronic kidney disease, stage 3b: Secondary | ICD-10-CM | POA: Diagnosis not present

## 2023-11-23 DIAGNOSIS — F5101 Primary insomnia: Secondary | ICD-10-CM | POA: Diagnosis not present

## 2023-11-23 DIAGNOSIS — E039 Hypothyroidism, unspecified: Secondary | ICD-10-CM | POA: Diagnosis not present

## 2023-11-23 DIAGNOSIS — E785 Hyperlipidemia, unspecified: Secondary | ICD-10-CM | POA: Diagnosis not present

## 2023-11-23 DIAGNOSIS — I5032 Chronic diastolic (congestive) heart failure: Secondary | ICD-10-CM | POA: Diagnosis not present

## 2023-11-23 DIAGNOSIS — I10 Essential (primary) hypertension: Secondary | ICD-10-CM | POA: Diagnosis not present

## 2023-11-23 DIAGNOSIS — E1169 Type 2 diabetes mellitus with other specified complication: Secondary | ICD-10-CM | POA: Diagnosis not present

## 2023-11-23 DIAGNOSIS — M1611 Unilateral primary osteoarthritis, right hip: Secondary | ICD-10-CM | POA: Diagnosis not present

## 2023-11-26 DIAGNOSIS — M25551 Pain in right hip: Secondary | ICD-10-CM | POA: Diagnosis not present

## 2023-11-27 ENCOUNTER — Encounter: Payer: Self-pay | Admitting: Advanced Practice Midwife

## 2023-11-29 ENCOUNTER — Other Ambulatory Visit: Payer: Self-pay | Admitting: Internal Medicine

## 2023-11-29 DIAGNOSIS — E890 Postprocedural hypothyroidism: Secondary | ICD-10-CM

## 2023-11-30 DIAGNOSIS — G4733 Obstructive sleep apnea (adult) (pediatric): Secondary | ICD-10-CM | POA: Diagnosis not present

## 2023-12-21 DIAGNOSIS — R197 Diarrhea, unspecified: Secondary | ICD-10-CM | POA: Diagnosis not present

## 2023-12-21 DIAGNOSIS — J101 Influenza due to other identified influenza virus with other respiratory manifestations: Secondary | ICD-10-CM | POA: Diagnosis not present

## 2023-12-21 DIAGNOSIS — R5381 Other malaise: Secondary | ICD-10-CM | POA: Diagnosis not present

## 2023-12-21 DIAGNOSIS — R110 Nausea: Secondary | ICD-10-CM | POA: Diagnosis not present

## 2024-01-18 ENCOUNTER — Other Ambulatory Visit: Payer: Self-pay | Admitting: Physician Assistant

## 2024-03-02 ENCOUNTER — Other Ambulatory Visit: Payer: Self-pay | Admitting: Internal Medicine

## 2024-03-02 DIAGNOSIS — E890 Postprocedural hypothyroidism: Secondary | ICD-10-CM

## 2024-03-03 ENCOUNTER — Ambulatory Visit: Admitting: Internal Medicine

## 2024-03-28 ENCOUNTER — Other Ambulatory Visit: Payer: Self-pay | Admitting: Physician Assistant

## 2024-04-04 ENCOUNTER — Encounter: Payer: Self-pay | Admitting: Internal Medicine

## 2024-04-04 ENCOUNTER — Other Ambulatory Visit

## 2024-04-04 ENCOUNTER — Ambulatory Visit: Admitting: Internal Medicine

## 2024-04-04 VITALS — BP 140/60 | HR 77 | Ht 60.0 in | Wt 122.2 lb

## 2024-04-04 DIAGNOSIS — Z23 Encounter for immunization: Secondary | ICD-10-CM

## 2024-04-04 DIAGNOSIS — R634 Abnormal weight loss: Secondary | ICD-10-CM | POA: Diagnosis not present

## 2024-04-04 DIAGNOSIS — E890 Postprocedural hypothyroidism: Secondary | ICD-10-CM | POA: Diagnosis not present

## 2024-04-04 DIAGNOSIS — E559 Vitamin D deficiency, unspecified: Secondary | ICD-10-CM

## 2024-04-04 LAB — POCT GLYCOSYLATED HEMOGLOBIN (HGB A1C): Hemoglobin A1C: 5.1 % (ref 4.0–5.6)

## 2024-04-04 NOTE — Progress Notes (Signed)
 Patient ID: Kimberly [name], female   DOB: 1940/11/04, 83 y.o.   MRN: [id number]  HPI  Kimberly [name] is a 83 y.o.-year-old female, returning for f/u for hypercalcemia, dx 2012, postsurgical hypothyroidism. Last visit was 7 months ago.  She is limited in coming for the appointments by the need for transportation.   She is very hypoacusic she wears hearing aids.  Interim history:  In 03/2023, she fell >> had to go to [place] >> learned to walk again. While there, she was taken off Lasix  >> gained weight to 180 lbs >> the restarted Lasix  and lost weight. She improved diet - more fruis and veggies.  She continues to have back and hip pain (OA) - had a steroid inj 12/2023.  She is preparing to get  another one.  Postsurgical hypothyroidism: - She had thyroidectomy for substernal goiter in 05/2015: Benign nodule pathology  Reviewed and addended history: She had problems with compliance with her levothyroxine  in the past.  In 06/2017 he was hospitalized and was found to have a junctional bradycardia with a TSH of 42.  At that time, daughter reported that she was not taking the levothyroxine  consistently.  Afterwards, she started to put the medication bottle on her nightstand and was consistently taking it daily.  However, she then had a TSH level that was high, at 15.  TSH remained high so we increased the dose to 150 mcg daily in 07/2019.  She then had an increase in dose to 175 mcg daily but we were then able to reduce the dose gradually.  She is currently taking 137 mcg LT4 daily: - in am - fasting - at least 30 min from b'fast - no calcium   - no iron - no multivitamins - no PPIs - not on Biotin  Reviewed her TFTs: Lab Results  Component Value Date   TSH 7.73 (H) 09/02/2023   TSH 7.391 (H) 04/01/2023   TSH 3.414 01/20/2023   TSH 108.08 (H) 08/27/2022   TSH 5.44 08/21/2021   TSH 0.05 (L) 06/27/2021   TSH 0.10 (L) 05/14/2021   TSH 1.14 09/28/2020   TSH 1.21 03/29/2020   TSH 3.96  09/22/2019   FREET4 1.0 09/02/2023   FREET4 0.71 08/27/2022   FREET4 1.09 08/21/2021   FREET4 2.19 (H) 06/27/2021   FREET4 1.52 05/14/2021   FREET4 1.38 09/28/2020   FREET4 1.53 03/29/2020   FREET4 1.25 09/22/2019   FREET4 1.18 07/20/2019   FREET4 1.23 04/13/2019  07/31/2015: TSH 58  Surprisingly, her calcium  has improved after parathyroid  surgery in 2016  Primary hyperparathyroidism: -Greatly improved from 2016  Reviewed recent labs: Lab Results  Component Value Date   PTH 19 09/18/2015   PTH Comment 09/18/2015   PTH 21 02/08/2015   PTH Comment 02/08/2015   PTH 17 12/09/2013   PTH Comment 12/09/2013   PTH 14.9 08/24/2012   CALCIUM  9.5 09/10/2023   CALCIUM  9.6 09/01/2023   CALCIUM  9.6 08/18/2023   CALCIUM  8.8 (L) 04/08/2023   CALCIUM  8.9 04/07/2023   CALCIUM  8.8 (L) 04/06/2023   CALCIUM  8.9 04/05/2023   CALCIUM  9.0 04/04/2023   CALCIUM  9.3 04/02/2023   CALCIUM  9.8 04/01/2023  03/25/2021: Corrected calcium  9.9 12/25/2014: Calcium  10.8 (8.6-10.3) 09/12/2013: Ca 10.8, iCa 5.7 (4.5-5.6)  Has a history of vitamin D  deficiency: Lab Results  Component Value Date   VD25OH 64 09/02/2023   VD25OH 66.30 08/27/2022   VD25OH 47.58 05/14/2021   VD25OH 27.9 (L) 09/28/2020   VD25OH 34.0 03/29/2020  VD25OH 34.49 07/20/2019   VD25OH 18.27 (L) 04/13/2019   VD25OH 57.47 06/14/2018   VD25OH 61.83 09/24/2017   VD25OH 32.67 09/18/2015   She continues on vitamin D , prev. 5000 units daily - now on gummies (x2 a day) - total 5000 units daily.  Reviewed other pertinent labs: Component     Latest Ref Rng 02/08/2015 02/08/2015         2:14 PM  2:14 PM  Vitamin A  (Retinoic Acid)     38 - 98 mcg/dL 89    Component     Latest Ref Rng 12/09/2013  Vitamin D  1, 25 (OH) Total     18 - 72 pg/mL 79 (H)  Vitamin D3 1, 25 (OH)      79  Vitamin D2 1, 25 (OH)      <8  PTH-Related Protein (PTH-RP)     14 - 27 pg/mL 16  Phosphorus     2.3 - 4.6 mg/dL 3.2   Her urine calcium  was  elevated: Component     Latest Ref Rng 01/20/2014  Calcium , Ur      14  Calcium , 24 hour urine     100 - 250 mg/day 322 (H)  Creatinine, Urine      51.9  Creatinine, 24H Ur     700 - 1800 mg/day 1194   No history of kidney stones. No history of osteoporosis.  No fractures.  Reviewed her DXA scan report from 2008:  LUMBAR SPINE (L1-L3)  BMD: 1.273  T-score (% of young adult value): 2.3  Z-score (% adult age match value): 4.1  LEFT HIP (NECK)  BMD: 0.831  T-score (% of young adult value): -0.2  Z-score (% adult age match value): 1.4  Assessment: This patient is considered normal according to World Health Organization [facility]) criteria.   She has CKD (Dr. Prescilla).  Latest BUN/creatinine reviewed: Lab Results  Component Value Date   BUN 39 (H) 09/10/2023   CREATININE 1.70 (H) 09/10/2023    She has had a technetium sestamibi scan (04/2015) that was negative for a parathyroid  adenoma e and a CT scan of her neck (04/2015) there was also negative for possible parathyroid  nodule nodule but this showed a substernal goiter with tracheal narrowing.  She also has a history of DM2 (managed by PCP), HTN, HL.  In 07/2019, she was admitted with encephalopathy either related to UTI or hypertension.  She had another admission with pneumonia 01/2023 and then with hypertensive emergency with encephalopathy (260/90) in 03/2023. She was in a SNF for rehab.  ROS: + see HPI  I reviewed pt's medications, allergies, PMH, social hx, family hx, and changes were documented in the history of present illness. Otherwise, unchanged from my initial visit note.  Past Medical History:  Diagnosis Date   Acid reflux    Anxiety    Arthritis    Back spasm    Bradycardia    a. junctional and sinus brady during admission 06/2017 requiring cessation of beta blocker and clonidine .   Carotid artery stenosis    Carotid Dopplers 07/2021 showed 40 to 59% right ICA stenosis and 1 to 39% left ICA stenosis.  The  right subclavian artery flow is disturbed.   Chronic diastolic heart failure (HCC) 03/20/2015   Chronic edema    CKD (chronic kidney disease), stage III (HCC)    Depression    Diabetes mellitus without complication (HCC)    diet controled   Fuch's endothelial dystrophy    bilateral eyes  Headache    occ   HOH (hard of hearing)    wears hearing aid in left ear   Hypercholesteremia    Hypertensive heart disease with CHF (HCC) 09/11/2014   Insomnia    Junctional bradycardia    Mild mitral regurgitation    Mild tricuspid regurgitation    Normal coronary arteries 2010   a. 2010 cath with no obstructive epicardial disease.  Done for CP which was felt to be due to microvascular angina or diastolic HF.   OSA on CPAP    Followed by Dr. Quita Salt   Patient's other noncompliance with medication regimen    per notes, gets confused easily with medications, difficult compliance    Past Surgical History:  Procedure Laterality Date   ABDOMINAL HYSTERECTOMY     APPENDECTOMY     CARDIAC CATHETERIZATION     no CAD   CESAREAN SECTION     x 2   CHOLECYSTECTOMY     COLONOSCOPY     EYE SURGERY Right    implant   ROTATOR CUFF REPAIR Left    SHOULDER ARTHROSCOPY WITH SUBACROMIAL DECOMPRESSION AND BICEP TENDON REPAIR Left 08/24/2012   Procedure: SHOULDER ARTHROSCOPY WITH SUBACROMIAL DECOMPRESSION AND BICEP TENDON REPAIR;  Surgeon: Cordella Glendia Hutchinson, MD;  Location: [facility] OR;  Service: Orthopedics;  Laterality: Left;  Left Shoulder Arthroscopy, Debridement, Biceps Tenotomy   THYROIDECTOMY N/A 06/12/2015   Procedure: TOTAL THYROIDECTOMY;  Surgeon: Krystal Spinner, MD;  Location: [facility] OR;  Service: General;  Laterality: N/A;   History   Social History   Marital Status: Married    Spouse Name: N/A    Number of Children: 2   Occupational History   radiographer, therapeutic    Social History Main Topics   Smoking status: Never Smoker    Smokeless tobacco: Not on file   Alcohol Use: No   Drug Use:  No   Current Outpatient Medications on File Prior to Visit  Medication Sig Dispense Refill   acetaminophen  (TYLENOL ) 500 MG tablet Take 1,000 mg by mouth every 6 (six) hours as needed for mild pain (pain score 1-3) or moderate pain (pain score 4-6).     albuterol  (VENTOLIN  HFA) 108 (90 Base) MCG/ACT inhaler Inhale 1 puff into the lungs every 4 (four) hours as needed for wheezing or shortness of breath.     amLODipine  (NORVASC ) 10 MG tablet Take 1 tablet by mouth once daily 90 tablet 1   aspirin  81 MG chewable tablet Chew 81 mg by mouth daily.     atorvastatin  (LIPITOR ) 40 MG tablet Take 1 tablet by mouth once daily 90 tablet 1   Cholecalciferol (VITAMIN D3) 5000 units CAPS Take 5,000 Units by mouth daily.     cloNIDine  (CATAPRES  - DOSED IN MG/24 HR) 0.2 mg/24hr patch Place 1 patch (0.2 mg total) onto the skin once a week. 4 patch 12   Fluticasone  Propionate, Inhal, 100 MCG/ACT AEPB Inhale 1 puff into the lungs in the morning and at bedtime.     hydrALAZINE  (APRESOLINE ) 100 MG tablet Take 1 tablet (100 mg total) by mouth 3 (three) times daily. 90 tablet 11   isosorbide  mononitrate (IMDUR ) 60 MG 24 hr tablet Take 0.5 tablets (30 mg total) by mouth daily.     LASIX  40 MG tablet Take 40 mg by mouth daily.     levothyroxine  (SYNTHROID ) 137 MCG tablet TAKE 1 TABLET BY MOUTH ONCE DAILY BEFORE BREAKFAST 90 tablet 0   nitroGLYCERIN  (NITROSTAT ) 0.4 MG SL tablet Place  1 tablet (0.4 mg total) under the tongue every 5 (five) minutes as needed for chest pain. 25 tablet 11   ondansetron  (ZOFRAN ) 4 MG tablet Take 1 tablet (4 mg total) by mouth every 8 (eight) hours as needed for nausea or vomiting. 20 tablet 0   PARoxetine  (PAXIL ) 20 MG tablet Take 20 mg by mouth daily.     No current facility-administered medications on file prior to visit.   Allergies  Allergen Reactions   Oxycodone-Acetaminophen  Shortness Of Breath, Itching and Other (See Comments)    Pt couldn't breathe or catch her breath   Percocet  Dominique.diss ] Shortness Of Breath and Other (See Comments)    Pt couldn't breathe or catch her breath   Codeine Itching and Other (See Comments)    hyperactivity    Other Other (See Comments)    Some pain medication given at [facility] for [facility] caused hallucinations    Promethazine Hcl Nausea And Vomiting and Other (See Comments)    Hallucinations     Clonidine  Derivatives Other (See Comments)    Bradycardia (06/2017)   Hydralazine  Other (See Comments)    Headache/dizzy    Clonidine  Palpitations    Bradycardia (06/2017)   Metoprolol  Palpitations    Bradycardia 06/2017    Family History  Problem Relation Age of Onset   Stomach cancer Mother    Cancer Sister        intestinal   PE: BP (!) 130/50 Comment: At home  Pulse 77   Ht 5' (1.524 m)   Wt 122 lb 3.2 oz (55.4 kg)   SpO2 97%   BMI 23.87 kg/m   Wt Readings from Last 10 Encounters:  04/04/24 122 lb 3.2 oz (55.4 kg)  09/02/23 130 lb 9.6 oz (59.2 kg)  08/18/23 132 lb 9.6 oz (60.1 kg)  04/10/23 137 lb 9.1 oz (62.4 kg)  04/02/23 140 lb 9.6 oz (63.8 kg)  03/17/23 134 lb 6.4 oz (61 kg)  01/20/23 134 lb (60.8 kg)  08/27/22 141 lb 9.6 oz (64.2 kg)  06/25/21 136 lb 12.8 oz (62.1 kg)  05/14/21 139 lb 3.2 oz (63.1 kg)   Constitutional: overweight, in NAD Eyes: EOMI, no exophthalmos ENT: no thyromegaly, no cervical lymphadenopathy Cardiovascular: RRR, No RG, + 1/6 SEM, + B LE pitting edema Respiratory: CTA B Musculoskeletal: no deformities Skin:  no rashes Neurological: + tremor with outstretched hands  Assessment: 1. Postsurgical hypothyroidism  2. H/o Hypercalcemia - ? primary hyperparathyroidism  3.  Vitamin D  Insufficiency  4. Weight loss  Plan: 1. Postsurgical hypothyroidism - Complicated by medication noncompliance - she had an extremely high TSH, at 108.  She most likely was not taking the levothyroxine  at all.  Her compliance improved afterwards. - latest thyroid  labs reviewed with  pt. >> TSH was elevated: Lab Results  Component Value Date   TSH 7.73 (H) 09/02/2023  - she continues on LT4 137 mcg daily.  After the above results returned, I advised her to return for repeat labs before changing the dose, but she did not do so yet... - pt feels good on this dose.  She did lose weight since last visit-her combination of fluid loss and improving diet - we discussed about taking the thyroid  hormone every day, with water , >30 minutes before breakfast, separated by >4 hours from acid reflux medications, calcium , iron, multivitamins. Pt. is taking it correctly. - will check thyroid  tests today: TSH and fT4 - If labs are abnormal, she will need to return for  repeat TFTs in 1.5 months - OTW, I will see her back in 6 months  2. Hypercalcemia - Interestingly, her calcium  normalized after her thyroid  surgery in 2016 -Reviewed previous investigation: Patient has had several instances of elevated calcium , with the highest being at 11.1.  Of note, her PTH levels were repeatedly checked and they were normal.  A PTHrp level was normal, multiple myeloma work-up was negative, vitamin A  level was normal, vitamin D  was normal or minimally low, she had a high 1, 25 dihydroxy vitamin D  with a normal phosphorus.  Her urinary calcium  was high in the past.  A technetium sestamibi scan, thyroid  ultrasound, and neck CT did not show any parathyroid  masses. - No apparent complications from hypercalcemia: No nephrolithiasis, abdominal pain. - Latest calcium  level was normal 09/2023.  We will not repeat this today  3.  Vitamin D  insufficiency - Continues 5000 units vitamin D  daily - Latest level was normal at last visit, in 08/2023  - Will repeat this at next visit  4. Wt loss - She lost almost 10 pounds since our last visit - She has a history of diabetes and we repeat her HbA1c today and this was 5.1%, normal  Needs refills.  + flu shot today  Lela Fendt, MD PhD [facility] Endocrinology

## 2024-04-04 NOTE — Patient Instructions (Addendum)
 Please continue levothyroxine  137 mcg daily.  Take the thyroid  hormone every day, with water , at least 30 minutes before breakfast, separated by at least 4 hours from: - acid reflux medications - calcium  - iron - multivitamins  Please continue vitamin D  5000 units daily.  Please stop at the lab.  Please come back for a follow-up appointment in 6 months.

## 2024-04-04 NOTE — Addendum Note (Signed)
 Addended by: CLEOTILDE ROLIN RAMAN on: 04/04/2024 10:03 AM   Modules accepted: Orders

## 2024-04-05 ENCOUNTER — Ambulatory Visit: Payer: Self-pay | Admitting: Internal Medicine

## 2024-04-05 LAB — TSH: TSH: 1.07 m[IU]/L (ref 0.40–4.50)

## 2024-04-05 LAB — T4, FREE: Free T4: 1.3 ng/dL (ref 0.8–1.8)

## 2024-04-05 MED ORDER — LEVOTHYROXINE SODIUM 137 MCG PO TABS: 137.0000 ug | ORAL_TABLET | Freq: Every day | ORAL | 3 refills | Status: AC

## 2024-04-05 NOTE — Addendum Note (Signed)
 Addended by: TRIXIE FILE on: 04/05/2024 08:38 AM   Modules accepted: Orders

## 2024-04-13 DIAGNOSIS — M25551 Pain in right hip: Secondary | ICD-10-CM | POA: Diagnosis not present

## 2024-04-22 ENCOUNTER — Other Ambulatory Visit: Payer: Self-pay | Admitting: Cardiology

## 2024-04-25 MED ORDER — CLONIDINE 0.2 MG/24HR TD PTWK: 0.2000 mg | MEDICATED_PATCH | TRANSDERMAL | 12 refills | Status: AC

## 2024-10-04 ENCOUNTER — Ambulatory Visit: Admitting: Internal Medicine
